# Patient Record
Sex: Male | Born: 1940 | Race: White | Hispanic: No | Marital: Married | Smoking: Never smoker
Health system: Southern US, Community
[De-identification: ages and names within clinical notes are randomized; demographics above are authoritative.]

## PROBLEM LIST (undated history)

## (undated) DIAGNOSIS — F32A Depression, unspecified: Secondary | ICD-10-CM

## (undated) DIAGNOSIS — B379 Candidiasis, unspecified: Secondary | ICD-10-CM

## (undated) DIAGNOSIS — M797 Fibromyalgia: Secondary | ICD-10-CM

## (undated) DIAGNOSIS — F329 Major depressive disorder, single episode, unspecified: Secondary | ICD-10-CM

## (undated) DIAGNOSIS — E785 Hyperlipidemia, unspecified: Secondary | ICD-10-CM

## (undated) DIAGNOSIS — N400 Enlarged prostate without lower urinary tract symptoms: Secondary | ICD-10-CM

## (undated) DIAGNOSIS — F528 Other sexual dysfunction not due to a substance or known physiological condition: Secondary | ICD-10-CM

## (undated) DIAGNOSIS — C9590 Leukemia, unspecified not having achieved remission: Secondary | ICD-10-CM

## (undated) DIAGNOSIS — K219 Gastro-esophageal reflux disease without esophagitis: Secondary | ICD-10-CM

## (undated) HISTORY — PX: CATARACT EXTRACTION, BILATERAL: SHX1313

## (undated) HISTORY — DX: Leukemia, unspecified not having achieved remission: C95.90

## (undated) HISTORY — PX: VITRECTOMY: SHX106

## (undated) HISTORY — DX: Major depressive disorder, single episode, unspecified: F32.9

## (undated) HISTORY — DX: Depression, unspecified: F32.A

## (undated) HISTORY — PX: CARPAL TUNNEL RELEASE: SHX101

## (undated) HISTORY — PX: OTHER SURGICAL HISTORY: SHX169

## (undated) HISTORY — PX: ADENOIDECTOMY: SUR15

## (undated) HISTORY — PX: VASECTOMY: SHX75

## (undated) HISTORY — DX: Hyperlipidemia, unspecified: E78.5

---

## 1996-06-30 HISTORY — PX: CERVICAL FUSION: SHX112

## 2011-06-18 ENCOUNTER — Telehealth: Payer: Self-pay | Admitting: Internal Medicine

## 2011-06-18 NOTE — Telephone Encounter (Signed)
Talked to pt, gave him appt date for 07/16/11, address, telephone

## 2011-06-20 ENCOUNTER — Telehealth: Payer: Self-pay | Admitting: Internal Medicine

## 2011-06-20 NOTE — Telephone Encounter (Signed)
Referred by Mady Gemma, PA Dx- CCL

## 2011-06-25 ENCOUNTER — Telehealth: Payer: Self-pay | Admitting: Internal Medicine

## 2011-06-25 NOTE — Telephone Encounter (Signed)
Referred by Mady Gemma, PA Dx- CLL

## 2011-07-09 DIAGNOSIS — G5600 Carpal tunnel syndrome, unspecified upper limb: Secondary | ICD-10-CM | POA: Diagnosis not present

## 2011-07-15 ENCOUNTER — Other Ambulatory Visit: Payer: Self-pay | Admitting: Internal Medicine

## 2011-07-15 DIAGNOSIS — C9110 Chronic lymphocytic leukemia of B-cell type not having achieved remission: Secondary | ICD-10-CM

## 2011-07-16 ENCOUNTER — Other Ambulatory Visit (HOSPITAL_COMMUNITY)
Admission: RE | Admit: 2011-07-16 | Discharge: 2011-07-16 | Disposition: A | Discharge status: Home / Self Care | Payer: Medicare Other | Source: Ambulatory Visit | Attending: Internal Medicine | Admitting: Internal Medicine

## 2011-07-16 ENCOUNTER — Ambulatory Visit (HOSPITAL_BASED_OUTPATIENT_CLINIC_OR_DEPARTMENT_OTHER): Payer: Medicare Other | Admitting: Internal Medicine

## 2011-07-16 ENCOUNTER — Ambulatory Visit: Payer: Medicare Other

## 2011-07-16 ENCOUNTER — Encounter: Payer: Self-pay | Admitting: Internal Medicine

## 2011-07-16 ENCOUNTER — Other Ambulatory Visit: Payer: Self-pay | Admitting: Internal Medicine

## 2011-07-16 ENCOUNTER — Other Ambulatory Visit: Payer: Medicare Other | Admitting: Lab

## 2011-07-16 VITALS — BP 136/71 | HR 95 | Temp 97.3°F | Ht 68.0 in | Wt 169.6 lb

## 2011-07-16 DIAGNOSIS — C9110 Chronic lymphocytic leukemia of B-cell type not having achieved remission: Secondary | ICD-10-CM | POA: Insufficient documentation

## 2011-07-16 LAB — COMPREHENSIVE METABOLIC PANEL
ALT: 24 U/L (ref 0–53)
BUN: 29 mg/dL — ABNORMAL HIGH (ref 6–23)
CO2: 27 mEq/L (ref 19–32)
Calcium: 9.7 mg/dL (ref 8.4–10.5)
Chloride: 102 mEq/L (ref 96–112)
Creatinine, Ser: 0.87 mg/dL (ref 0.50–1.35)
Glucose, Bld: 138 mg/dL — ABNORMAL HIGH (ref 70–99)

## 2011-07-16 LAB — CBC WITH DIFFERENTIAL/PLATELET
Basophils Absolute: 0.1 10*3/uL (ref 0.0–0.1)
Eosinophils Absolute: 0.2 10*3/uL (ref 0.0–0.5)
HCT: 43.8 % (ref 38.4–49.9)
HGB: 14.9 g/dL (ref 13.0–17.1)
LYMPH%: 63.9 % — ABNORMAL HIGH (ref 14.0–49.0)
MONO#: 0.4 10*3/uL (ref 0.1–0.9)
NEUT%: 31.4 % — ABNORMAL LOW (ref 39.0–75.0)
Platelets: 200 10*3/uL (ref 140–400)
WBC: 12.8 10*3/uL — ABNORMAL HIGH (ref 4.0–10.3)
lymph#: 8.2 10*3/uL — ABNORMAL HIGH (ref 0.9–3.3)

## 2011-07-16 LAB — FLOW CYTOMETRY

## 2011-07-16 LAB — LACTATE DEHYDROGENASE: LDH: 178 U/L (ref 94–250)

## 2011-07-16 NOTE — Progress Notes (Signed)
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 He was under the care of Dr. Margarita Rana with [facility] cancer specialist in [place]. The patient has been followed by observation. He was recently moved to [facility] to be close to his son. The patient came today to establish care with a hematologist. He denied having any complaints except for occasional insomnia as well as mild weight loss. He feels good most of the time. He denied having any night sweats, no palpable lymphadenopathy, no bleeding issues. Has no chest pain or shortness of breath, no cough or hemoptysis. He has no evidence of recurrent infections. He was referred to me today for evaluation and recommendation regarding his condition.  @[email or web address] @  Past Medical History  Diagnosis Date  . Diabetes mellitus   . Leukemia chronic lymphocytic  . Depression     Past Surgical History  Procedure Date  . Carpal tunnel release   . Vitrectomy   . Vasectomy   . Tonsil   . Cervical fusion   . Retinal micro aneurysms   . Cataract extraction, bilateral   . Adenoidectomy     Family History  Problem Relation Age of Onset  . Diabetes Father   . Diabetes Sister   . Diabetes Brother     Social History History  Substance Use Topics  . Smoking status: Never Smoker   . Smokeless tobacco: Not on file  . Alcohol Use: Yes    Allergies  Allergen Reactions  . Bee Venom   . Shellfish Allergy Swelling    Current Outpatient Prescriptions  Medication Sig Dispense Refill  . Ascorbic Acid (VITAMIN C) 100 MG tablet Take 100 mg by mouth daily.      Marland Kitchen aspirin 81 MG tablet Take 81 mg by mouth daily.      Marland Kitchen buPROPion (WELLBUTRIN) 100 MG tablet Take 100 mg by mouth 3 (three)  times daily.      . calcium citrate-vitamin D (CITRACAL+D) 315-200 MG-UNIT per tablet Take 1 tablet by mouth daily.      . cetirizine (ZYRTEC) 10 MG tablet Take 10 mg by mouth daily.      . fish oil-omega-3 fatty acids 1000 MG capsule Take 2 g by mouth daily.      . insulin lispro (HUMALOG) 100 UNIT/ML injection Inject 100 Units into the skin daily as needed.      . insulin lispro protamine-insulin lispro (HUMALOG 75/25) (75-25) 100 UNIT/ML SUSP Inject 100 Units into the skin 2 (two) times daily before a meal.      . Multiple Vitamin (MULTIVITAMIN) capsule Take 1 capsule by mouth daily.      . naproxen sodium (ANAPROX) 220 MG tablet Take 220 mg by mouth 2 (two) times daily with a meal.      . terazosin (HYTRIN) 5 MG capsule Take 5 mg by mouth at bedtime.      . thiamine (VITAMIN B-1) 100 MG tablet Take 100 mg by mouth daily.        Review of Systems  A comprehensive review of systems was negative except for: Constitutional: positive for fatigue Behavioral/Psych: positive for sleep disturbance  Physical Exam  ZOX:WRUEA, healthy, no distress, well nourished and well developed SKIN: skin color, texture, turgor are normal HEAD: Normocephalic EYES:  normal EARS: External ears normal OROPHARYNX:no exudate, no erythema and lips, buccal mucosa, and tongue normal  NECK: supple, no adenopathy LYMPH:  no palpable lymphadenopathy, no hepatosplenomegaly LUNGS: clear to auscultation  HEART: regular rate & rhythm, no murmurs and no gallops ABDOMEN:abdomen soft, non-tender, normal bowel sounds and no masses or organomegaly BACK: Back symmetric, no curvature. EXTREMITIES:no joint deformities, effusion, or inflammation, no edema, no skin discoloration, no clubbing, no cyanosis  NEURO: alert & oriented x 3 with fluent speech, no focal motor/sensory deficits  ASSESSMENT: This is a very pleasant 71 years old white male with stage 0 chronic lymphocytic leukemia diagnosed 4 years ago in [place]. The  patient is doing fine today with no significant complaints and no palpable lymphadenopathy. I have a lengthy discussion with the patient today about his disease stage, prognosis and treatment options. I explained to the patient that the current standard of care for his condition is observation. I would consider him for treatment only if he has a short doubling time of his total white blood count or significant evidence for disease progression with lymphadenopathy or endorgan damage.  PLAN: I ordered flow cytometry of the peripheral blood to confirm his diagnosis. I will continue the patient on observation for now with repeat CBC,  Comprehensive metabolic panel and LDH in 6 months. The patient would come back for followup visit at that time. He was advised to call me immediately if he has any concerning symptoms in the interval. The patient agreed to the current plan.  All questions were answered. The patient knows to call the clinic with any problems, questions or concerns. We can certainly see the patient much sooner if necessary.  Thank you so much for allowing me to participate in the care of Hayden [name]. I will continue to follow up the patient with you and assist in his care.  I spent 25 minutes counseling the patient face to face. The total time spent in the appointment was 50 minutes.   Sarika Baldini K. 07/16/2011, 5:47 PM

## 2011-09-04 ENCOUNTER — Emergency Department (HOSPITAL_COMMUNITY)
Admission: EM | Admit: 2011-09-04 | Discharge: 2011-09-04 | Disposition: A | Discharge status: Home / Self Care | Payer: Medicare Other | Attending: Emergency Medicine | Admitting: Emergency Medicine

## 2011-09-04 DIAGNOSIS — F329 Major depressive disorder, single episode, unspecified: Secondary | ICD-10-CM | POA: Diagnosis not present

## 2011-09-04 DIAGNOSIS — K5289 Other specified noninfective gastroenteritis and colitis: Secondary | ICD-10-CM | POA: Diagnosis not present

## 2011-09-04 DIAGNOSIS — Z7982 Long term (current) use of aspirin: Secondary | ICD-10-CM | POA: Diagnosis not present

## 2011-09-04 DIAGNOSIS — R197 Diarrhea, unspecified: Secondary | ICD-10-CM | POA: Diagnosis not present

## 2011-09-04 DIAGNOSIS — R112 Nausea with vomiting, unspecified: Secondary | ICD-10-CM | POA: Diagnosis not present

## 2011-09-04 DIAGNOSIS — Z79899 Other long term (current) drug therapy: Secondary | ICD-10-CM | POA: Insufficient documentation

## 2011-09-04 DIAGNOSIS — R1915 Other abnormal bowel sounds: Secondary | ICD-10-CM | POA: Insufficient documentation

## 2011-09-04 DIAGNOSIS — Z856 Personal history of leukemia: Secondary | ICD-10-CM | POA: Diagnosis not present

## 2011-09-04 DIAGNOSIS — F3289 Other specified depressive episodes: Secondary | ICD-10-CM | POA: Insufficient documentation

## 2011-09-04 DIAGNOSIS — R61 Generalized hyperhidrosis: Secondary | ICD-10-CM | POA: Diagnosis not present

## 2011-09-04 DIAGNOSIS — E119 Type 2 diabetes mellitus without complications: Secondary | ICD-10-CM | POA: Insufficient documentation

## 2011-09-04 DIAGNOSIS — K529 Noninfective gastroenteritis and colitis, unspecified: Secondary | ICD-10-CM

## 2011-09-04 DIAGNOSIS — Z794 Long term (current) use of insulin: Secondary | ICD-10-CM | POA: Diagnosis not present

## 2011-09-04 LAB — COMPREHENSIVE METABOLIC PANEL
ALT: 28 U/L (ref 0–53)
AST: 22 U/L (ref 0–37)
CO2: 27 mEq/L (ref 19–32)
Calcium: 8.6 mg/dL (ref 8.4–10.5)
Sodium: 141 mEq/L (ref 135–145)
Total Protein: 6.2 g/dL (ref 6.0–8.3)

## 2011-09-04 LAB — CBC
MCV: 92.8 fL (ref 78.0–100.0)
Platelets: 191 10*3/uL (ref 150–400)
RDW: 13.2 % (ref 11.5–15.5)
WBC: 11.7 10*3/uL — ABNORMAL HIGH (ref 4.0–10.5)

## 2011-09-04 LAB — DIFFERENTIAL
Basophils Absolute: 0 10*3/uL (ref 0.0–0.1)
Eosinophils Absolute: 0.1 10*3/uL (ref 0.0–0.7)
Eosinophils Relative: 1 % (ref 0–5)
Lymphocytes Relative: 45 % (ref 12–46)

## 2011-09-04 MED ORDER — FAMOTIDINE 20 MG PO TABS: 20.0000 mg | ORAL_TABLET | Freq: Two times a day (BID) | ORAL | Status: DC | PRN

## 2011-09-04 MED ORDER — ONDANSETRON HCL 8 MG PO TABS: 8.0000 mg | ORAL_TABLET | Freq: Three times a day (TID) | ORAL | Status: AC | PRN

## 2011-09-04 MED ORDER — FAMOTIDINE IN NACL 20-0.9 MG/50ML-% IV SOLN
20.0000 mg | Freq: Once | INTRAVENOUS | Status: AC
  Administered 2011-09-04: 20 mg via INTRAVENOUS
  Filled 2011-09-04: qty 50

## 2011-09-04 MED ORDER — SODIUM CHLORIDE 0.9 % IV BOLUS (SEPSIS): 1000.0000 mL | Freq: Once | INTRAVENOUS | Status: DC

## 2011-09-04 MED ORDER — SODIUM CHLORIDE 0.9 % IV SOLN
INTRAVENOUS | Status: DC
  Administered 2011-09-04: 16:00:00 via INTRAVENOUS

## 2011-09-04 MED ORDER — ONDANSETRON HCL 4 MG/2ML IJ SOLN
INTRAMUSCULAR | Status: AC
  Filled 2011-09-04: qty 2

## 2011-09-04 NOTE — Discharge Instructions (Signed)
Clear Liquid Diet The clear liquid dietconsists of foods that are liquid or will become liquid at room temperature.You should be able to see through the liquid and beverages. Examples of foods allowed on a clear liquid diet include fruit juice, broth or bouillon, gelatin, or frozen ice pops. The purpose of this diet is to provide necessary fluid, electrolytes such as sodium and potassium, and energy to keep the body functioning during times when you are not able to consume a regular diet.A clear liquid diet should not be continued for long periods of time as it is not nutritionally adequate.  REASONS FOR USING A CLEAR LIQUID DIET  In sudden onset (acute) conditions for a patient before or after surgery.   As the first step in oral feeding.   For fluid and electrolyte replacement in diarrheal diseases.   As a diet before certain medical tests are performed.  ADEQUACY The clear liquid diet is adequate only in ascorbic acid, according to the Recommended Dietary Allowances of the National Research Council. CHOOSING FOODS Breads and Starches  Allowed:  None are allowed.   Avoid: All are avoided.  Vegetables  Allowed:  Strained tomato or vegetable juice.   Avoid: Any others.  Fruit  Allowed:  Strained fruit juices and fruit drinks. Include 1 serving of citrus or vitamin C-enriched fruit juice daily.   Avoid: Any others.  Meat and Meat Substitutes  Allowed:  None are allowed.   Avoid: All are avoided.  Milk  Allowed:  None are allowed.   Avoid: All are avoided.  Soups and Combination Foods  Allowed:  Clear bouillon, broth, or strained broth-based soups.   Avoid: Any others.  Desserts and Sweets  Allowed:  Sugar, honey. High protein gelatin. Flavored gelatin, ices, or frozen ice pops that do not contain milk.   Avoid: Any others.  Fats and Oils  Allowed:  None are allowed.   Avoid: All are avoided.  Beverages  Allowed: Cereal beverages, coffee (regular or  decaffeinated), tea, or soda at the discretion of your caregiver.   Avoid: Any others.  Condiments  Allowed:  Iodized salt.   Avoid: Any others, including pepper.  Supplements  Allowed:  Liquid nutrition beverages.   Avoid: Any others that contain lactose or fiber.  SAMPLE MEAL PLAN Breakfast  4 oz (120 mL) strained orange juice.    to 1 cup (125 to 250 mL) gelatin (plain or fortified).   1 cup (250 mL) beverage (coffee or tea).   Sugar, if desired.  Midmorning Snack   cup (125 mL) gelatin (plain or fortified).  Lunch  1 cup (250 mL) broth or consomm.   4 oz (120 mL) strained grapefruit juice.    cup (125 mL) gelatin (plain or fortified).   1 cup (250 mL) beverage (coffee or tea).   Sugar, if desired.  Midafternoon Snack   cup (125 mL) fruit ice.    cup (125 mL) strained fruit juice.  Dinner  1 cup (250 mL) broth or consomm.    cup (125 mL) cranberry juice.    cup (125 mL) flavored gelatin (plain or fortified).   1 cup (250 mL) beverage (coffee or tea).   Sugar, if desired.  Evening Snack  4 oz (120 mL) strained apple juice (vitamin C-fortified).    cup (125 mL) flavored gelatin (plain or fortified).  Document Released: 06/16/2005 Document Revised: 06/05/2011 Document Reviewed: 09/13/2010 ExitCare Patient Information 2012 [employer].Viral Gastroenteritis Viral gastroenteritis is also known as stomach flu. This condition   affects the stomach and intestinal tract. It can cause sudden diarrhea and vomiting. The illness typically lasts 3 to 8 days. Most people develop an immune response that eventually gets rid of the virus. While this natural response develops, the virus can make you quite ill. CAUSES  Many different viruses can cause gastroenteritis, such as rotavirus or noroviruses. You can catch one of these viruses by consuming contaminated food or water. You may also catch a virus by sharing utensils or other personal items with an  infected person or by touching a contaminated surface. SYMPTOMS  The most common symptoms are diarrhea and vomiting. These problems can cause a severe loss of body fluids (dehydration) and a body salt (electrolyte) imbalance. Other symptoms may include:  Fever.   Headache.   Fatigue.   Abdominal pain.  DIAGNOSIS  Your caregiver can usually diagnose viral gastroenteritis based on your symptoms and a physical exam. A stool sample may also be taken to test for the presence of viruses or other infections. TREATMENT  This illness typically goes away on its own. Treatments are aimed at rehydration. The most serious cases of viral gastroenteritis involve vomiting so severely that you are not able to keep fluids down. In these cases, fluids must be given through an intravenous line (IV). HOME CARE INSTRUCTIONS   Drink enough fluids to keep your urine clear or pale yellow. Drink small amounts of fluids frequently and increase the amounts as tolerated.   Ask your caregiver for specific rehydration instructions.   Avoid:   Foods high in sugar.   Alcohol.   Carbonated drinks.   Tobacco.   Juice.   Caffeine drinks.   Extremely hot or cold fluids.   Fatty, greasy foods.   Too much intake of anything at one time.   Dairy products until 24 to 48 hours after diarrhea stops.   You may consume probiotics. Probiotics are active cultures of beneficial bacteria. They may lessen the amount and number of diarrheal stools in adults. Probiotics can be found in yogurt with active cultures and in supplements.   Wash your hands well to avoid spreading the virus.   Only take over-the-counter or prescription medicines for pain, discomfort, or fever as directed by your caregiver. Do not give aspirin to children. Antidiarrheal medicines are not recommended.   Ask your caregiver if you should continue to take your regular prescribed and over-the-counter medicines.   Keep all follow-up appointments  as directed by your caregiver.  SEEK IMMEDIATE MEDICAL CARE IF:   You are unable to keep fluids down.   You do not urinate at least once every 6 to 8 hours.   You develop shortness of breath.   You notice blood in your stool or vomit. This may look like coffee grounds.   You have abdominal pain that increases or is concentrated in one small area (localized).   You have persistent vomiting or diarrhea.   You have a fever.   The patient is a child younger than 3 months, and he or she has a fever.   The patient is a child older than 3 months, and he or she has a fever and persistent symptoms.   The patient is a child older than 3 months, and he or she has a fever and symptoms suddenly get worse.   The patient is a baby, and he or she has no tears when crying.  MAKE SURE YOU:   Understand these instructions.   Will watch your condition.     Will get help right away if you are not doing well or get worse.  Document Released: 06/16/2005 Document Revised: 06/05/2011 Document Reviewed: 04/02/2011 ExitCare Patient Information 2012 [employer]. RESOURCE GUIDE  Dental Problems  Patients with Medicaid: [facility]                      Dental [address]                                           [address] Phone:  [phone]                                                  Phone:  [phone]  If unable to pay or uninsured, contact:  [facility] or [facility] to become qualified for the adult dental clinic.  Chronic Pain Problems Contact [facility]  [phone] Patients need to be referred by their primary care doctor.  Insufficient Money for Medicine Contact [employer]:  call "211" or [facility] 271-5999.  No Primary Care Doctor Call [facility]  832-8000 Other agencies that provide inexpensive medical care    [facility]  [phone]    [facility]   [phone]    [facility]  [phone]    Women's Clinic  [phone]    [facility]  [phone]    [facility]  [phone]  Psychological Services  Health  [phone] [facility]  [phone] [facility]   800 853-5163 (emergency services 641-4993)  Substance Abuse Resources [facility]  336-882-2125 [facility] 336-784-9470 [facility] 336-285-9073 [facility] 336-845-3988 Residential & Outpatient Substance Abuse Program  800-659-3381  Abuse/Neglect [place] Child Abuse Hotline (336) 641-3795 [place] Child Abuse Hotline 800-378-5315 (After Hours)  Emergency Shelter [facility] (336) 271-5985  [facility] (336) 275-9566 [facility] (704) 372-4663  MRSA Hotline #:   832-7006    [place] [facility]  [facility] [place]     [employer]                          [facility] [address] [place]                       [address]      [address]  [place]                                                [place]                            [place] Phone:  [phone]                                     Phone:  [phone]                 Phone:  [phone]  [facility] Phone:  [phone]  [place] Child Abuse Hotline (336) 342-1394 (336) 342-3537 (After Hours)   

## 2011-09-04 NOTE — ED Notes (Signed)
Here with c/o nausea/vomiting/diarrhea since yesterday. Sent here per pts PA Arlyce Dice.pt has history of cancer,DM. Given NS enroute and Zofran 4 mg. States he feels better already. Denies abd pain.

## 2011-09-04 NOTE — ED Provider Notes (Addendum)
History     CSN: [id number]  Arrival date & time 09/04/11  1430   First MD Initiated Contact with Patient 09/04/11 1431      Chief Complaint  Patient presents with  . Emesis    diarrhea,nausea    (Consider location/radiation/quality/duration/timing/severity/associated sxs/prior treatment) HPI Comments: The patient is a 71 year old male with a past medical history significant for diabetes and chronic lymphocytic leukemia who presents to the emergency department for evaluation and treatment of nausea, vomiting, and diarrhea. The patient reports that nausea began last night after dinner and the patient went to bed, awakening around midnight to have an episode of watery diarrhea followed by vomiting. He had 3 more such episodes throughout the night of vomiting and diarrhea and called EMS to bring him to the hospital. The EMS team gave him a dose of Zofran IV and the patient says that his symptoms have completely resolved at this time with no abdominal pain, no further nausea, no further vomiting. He denies any blood in vomit or stool. He denies any other complaints.  Patient is a 71 y.o. male presenting with vomiting. The history is provided by the patient, the spouse and the EMS personnel.  Emesis  This is a new problem. The current episode started 12 to 24 hours ago. The problem occurs 5 to 10 times per day. The problem has been resolved. The emesis has an appearance of stomach contents. There has been no fever. Associated symptoms include diarrhea and sweats. Pertinent negatives include no abdominal pain, no arthralgias, no chills, no cough, no fever, no headaches, no myalgias and no URI.    Past Medical History  Diagnosis Date  . Diabetes mellitus   . Leukemia chronic lymphocytic  . Depression     Past Surgical History  Procedure Date  . Carpal tunnel release   . Vitrectomy   . Vasectomy   . Tonsil   . Cervical fusion   . Retinal micro aneurysms   . Cataract extraction, bilateral    . Adenoidectomy     Family History  Problem Relation Age of Onset  . Diabetes Father   . Diabetes Sister   . Diabetes Brother     History  Substance Use Topics  . Smoking status: Never Smoker   . Smokeless tobacco: Not on file  . Alcohol Use: Yes      Review of Systems  Unable to perform ROS Constitutional: Negative for fever, chills, diaphoresis, activity change, appetite change, fatigue and unexpected weight change.  HENT: Negative for ear pain, congestion, sore throat, rhinorrhea, mouth sores, trouble swallowing, neck pain, neck stiffness and postnasal drip.   Eyes: Negative.   Respiratory: Negative for cough, chest tightness, shortness of breath and wheezing.   Cardiovascular: Negative for chest pain, palpitations and leg swelling.  Gastrointestinal: Positive for nausea, vomiting and diarrhea. Negative for abdominal pain, constipation, blood in stool, abdominal distention, anal bleeding and rectal pain.  Genitourinary: Negative for dysuria, urgency, frequency, hematuria and flank pain.  Musculoskeletal: Negative for myalgias, back pain and arthralgias.  Skin: Negative for color change, pallor, rash and wound.  Neurological: Negative for dizziness, syncope, weakness, light-headedness and headaches.  Hematological: Negative for adenopathy.  Psychiatric/Behavioral: Negative.     Allergies  Bee venom and Shellfish allergy  Home Medications   Current Outpatient Rx  Name Route Sig Dispense Refill  . ASPIRIN 81 MG PO TABS Oral Take 81 mg by mouth at bedtime.     . BUPROPION HCL 100 MG PO TABS  Oral Take 100 mg by mouth 2 (two) times daily.     Marland Kitchen FEXOFENADINE HCL 60 MG PO TABS Oral Take 60 mg by mouth 2 (two) times daily.    . OMEGA-3 FATTY ACIDS 1000 MG PO CAPS Oral Take 1 g by mouth 2 (two) times daily.     Marland Kitchen NAPROXEN SODIUM 220 MG PO TABS Oral Take 220 mg by mouth 2 (two) times daily with a meal.    . VITAMIN C 500 MG PO TABS Oral Take 500 mg by mouth every morning.      Marland Kitchen CALCIUM CITRATE-VITAMIN D 315-200 MG-UNIT PO TABS Oral Take 1 tablet by mouth daily.    Marland Kitchen CETIRIZINE HCL 10 MG PO TABS Oral Take 10 mg by mouth daily.    . INSULIN LISPRO (HUMAN) 100 UNIT/ML [place] SOLN Subcutaneous Inject 100 Units into the skin daily as needed.    . INSULIN LISPRO PROT & LISPRO (75-25) 100 UNIT/ML [place] SUSP Subcutaneous Inject 100 Units into the skin 2 (two) times daily before a meal.    . TERAZOSIN HCL 5 MG PO CAPS Oral Take 5 mg by mouth at bedtime.      There were no vitals taken for this visit.  Physical Exam  Nursing note and vitals reviewed. Constitutional: He is oriented to person, place, and time. He appears well-developed and well-nourished. He is active.  Non-toxic appearance. He does not have a sickly appearance. He does not appear ill. No distress.  HENT:  Head: Normocephalic and atraumatic.  Right Ear: Hearing, tympanic membrane, external ear and ear canal normal.  Left Ear: Hearing, tympanic membrane, external ear and ear canal normal.  Nose: Nose normal. No mucosal edema.  Mouth/Throat: Uvula is midline and oropharynx is clear and moist. Mucous membranes are dry. No oral lesions. No uvula swelling. No oropharyngeal exudate, posterior oropharyngeal edema, posterior oropharyngeal erythema or tonsillar abscesses.  Eyes: Conjunctivae and EOM are normal. Pupils are equal, round, and reactive to light. Right eye exhibits no chemosis, no discharge and no exudate. Left eye exhibits no chemosis, no discharge and no exudate. Right conjunctiva is not injected. Left conjunctiva is not injected. No scleral icterus.  Neck: Normal range of motion, full passive range of motion without pain and phonation normal. Neck supple. No rigidity. No Brudzinski's sign noted.  Cardiovascular: Normal rate, regular rhythm, intact distal pulses and normal pulses.   No extrasystoles are present.  Pulmonary/Chest: Effort normal and breath sounds normal. No accessory muscle usage. Not tachypneic.  No respiratory distress. He has no decreased breath sounds. He has no wheezes. He has no rhonchi. He has no rales. He exhibits no tenderness, no crepitus and no retraction.  Abdominal: Soft. Normal appearance. He exhibits no shifting dullness, no distension, no pulsatile liver, no fluid wave, no abdominal bruit, no ascites, no pulsatile midline mass and no mass. Bowel sounds are increased. There is no hepatosplenomegaly. There is no tenderness. There is no rigidity, no rebound, no guarding and no CVA tenderness. No hernia.  Musculoskeletal: Normal range of motion.  Neurological: He is alert and oriented to person, place, and time. He has normal strength and normal reflexes. He is not disoriented. No cranial nerve deficit. Coordination normal. GCS eye subscore is 4. GCS verbal subscore is 5. GCS motor subscore is 6.  Skin: Skin is warm, dry and intact. No bruising, no ecchymosis, no lesion and no rash noted. He is not diaphoretic. No erythema. No pallor.  Psychiatric: He has a normal mood and  affect. His speech is normal and behavior is normal. Judgment and thought content normal. Cognition and memory are normal.    ED Course  Procedures (including critical care time)  Labs Reviewed - No data to display No results found.   No diagnosis found.    MDM  The patient appears most likely to have a viral gastroenteritis, uncomplicated. His abdominal exam is benign with no pain or tenderness or other abnormalities. At this time he is having no further nausea or vomiting after a dose of Zofran. We will get him rehydrated with IV fluids, I will treat him with antacids to settle his stomach, and I will check his electrolytes to assure there is no hyperglycemia or electrolyte abnormality subsequent to the vomiting and diarrhea. I expect that his white blood cell count will be elevated, as the patient tells me that it has been trending upwards over the last 4 years due to chronic lymphocytic leukemia. Based on  his physical examination and his history, I do not suspect an acute intra-abdominal process other than viral gastroenteritis, and I do not perceive him to need a CT scan. He has had no chest pain or shortness of breath palpitations to suggest a cardiac process masquerading as a gastroenteric problem.        Felisa Bonier, MD 09/04/11 1454  4:14 PM The patient's symptoms remain controlled, he has had no further vomiting, and is tolerating oral fluid intake.  Felisa Bonier, MD 09/04/11 [phone]

## 2011-11-05 DIAGNOSIS — M25559 Pain in unspecified hip: Secondary | ICD-10-CM | POA: Diagnosis not present

## 2011-11-05 DIAGNOSIS — M1990 Unspecified osteoarthritis, unspecified site: Secondary | ICD-10-CM | POA: Diagnosis not present

## 2011-11-10 DIAGNOSIS — R262 Difficulty in walking, not elsewhere classified: Secondary | ICD-10-CM | POA: Diagnosis not present

## 2011-11-10 DIAGNOSIS — M25559 Pain in unspecified hip: Secondary | ICD-10-CM | POA: Diagnosis not present

## 2011-11-12 DIAGNOSIS — R262 Difficulty in walking, not elsewhere classified: Secondary | ICD-10-CM | POA: Diagnosis not present

## 2011-11-12 DIAGNOSIS — M25559 Pain in unspecified hip: Secondary | ICD-10-CM | POA: Diagnosis not present

## 2011-11-17 DIAGNOSIS — R262 Difficulty in walking, not elsewhere classified: Secondary | ICD-10-CM | POA: Diagnosis not present

## 2011-11-17 DIAGNOSIS — M25559 Pain in unspecified hip: Secondary | ICD-10-CM | POA: Diagnosis not present

## 2011-11-19 DIAGNOSIS — R262 Difficulty in walking, not elsewhere classified: Secondary | ICD-10-CM | POA: Diagnosis not present

## 2011-11-19 DIAGNOSIS — M25559 Pain in unspecified hip: Secondary | ICD-10-CM | POA: Diagnosis not present

## 2011-11-26 DIAGNOSIS — M25559 Pain in unspecified hip: Secondary | ICD-10-CM | POA: Diagnosis not present

## 2011-11-26 DIAGNOSIS — Z794 Long term (current) use of insulin: Secondary | ICD-10-CM | POA: Diagnosis not present

## 2011-11-26 DIAGNOSIS — E119 Type 2 diabetes mellitus without complications: Secondary | ICD-10-CM | POA: Diagnosis not present

## 2011-11-26 DIAGNOSIS — E785 Hyperlipidemia, unspecified: Secondary | ICD-10-CM | POA: Diagnosis not present

## 2011-11-28 DIAGNOSIS — M25559 Pain in unspecified hip: Secondary | ICD-10-CM | POA: Diagnosis not present

## 2011-11-28 DIAGNOSIS — R262 Difficulty in walking, not elsewhere classified: Secondary | ICD-10-CM | POA: Diagnosis not present

## 2011-12-01 DIAGNOSIS — M25559 Pain in unspecified hip: Secondary | ICD-10-CM | POA: Diagnosis not present

## 2011-12-01 DIAGNOSIS — R262 Difficulty in walking, not elsewhere classified: Secondary | ICD-10-CM | POA: Diagnosis not present

## 2011-12-03 DIAGNOSIS — R262 Difficulty in walking, not elsewhere classified: Secondary | ICD-10-CM | POA: Diagnosis not present

## 2011-12-03 DIAGNOSIS — M25559 Pain in unspecified hip: Secondary | ICD-10-CM | POA: Diagnosis not present

## 2011-12-15 DIAGNOSIS — R262 Difficulty in walking, not elsewhere classified: Secondary | ICD-10-CM | POA: Diagnosis not present

## 2011-12-15 DIAGNOSIS — M25559 Pain in unspecified hip: Secondary | ICD-10-CM | POA: Diagnosis not present

## 2011-12-24 DIAGNOSIS — E119 Type 2 diabetes mellitus without complications: Secondary | ICD-10-CM | POA: Diagnosis not present

## 2011-12-24 DIAGNOSIS — E785 Hyperlipidemia, unspecified: Secondary | ICD-10-CM | POA: Diagnosis not present

## 2011-12-25 DIAGNOSIS — E119 Type 2 diabetes mellitus without complications: Secondary | ICD-10-CM | POA: Diagnosis not present

## 2011-12-26 DIAGNOSIS — B86 Scabies: Secondary | ICD-10-CM | POA: Diagnosis not present

## 2012-01-13 ENCOUNTER — Ambulatory Visit (HOSPITAL_BASED_OUTPATIENT_CLINIC_OR_DEPARTMENT_OTHER): Payer: Medicare Other | Admitting: Internal Medicine

## 2012-01-13 ENCOUNTER — Telehealth: Payer: Self-pay | Admitting: Internal Medicine

## 2012-01-13 ENCOUNTER — Other Ambulatory Visit (HOSPITAL_BASED_OUTPATIENT_CLINIC_OR_DEPARTMENT_OTHER): Payer: Medicare Other | Admitting: Lab

## 2012-01-13 VITALS — BP 107/57 | HR 86 | Temp 97.1°F | Ht 68.0 in | Wt 164.8 lb

## 2012-01-13 DIAGNOSIS — C9110 Chronic lymphocytic leukemia of B-cell type not having achieved remission: Secondary | ICD-10-CM | POA: Diagnosis not present

## 2012-01-13 LAB — CBC WITH DIFFERENTIAL/PLATELET
Basophils Absolute: 0 10*3/uL (ref 0.0–0.1)
EOS%: 2.6 % (ref 0.0–7.0)
HCT: 39.9 % (ref 38.4–49.9)
HGB: 13.5 g/dL (ref 13.0–17.1)
LYMPH%: 61.5 % — ABNORMAL HIGH (ref 14.0–49.0)
MCH: 31.8 pg (ref 27.2–33.4)
MCHC: 33.8 g/dL (ref 32.0–36.0)
MCV: 93.9 fL (ref 79.3–98.0)
NEUT%: 31.6 % — ABNORMAL LOW (ref 39.0–75.0)
Platelets: 184 10*3/uL (ref 140–400)
lymph#: 6.6 10*3/uL — ABNORMAL HIGH (ref 0.9–3.3)

## 2012-01-13 LAB — COMPREHENSIVE METABOLIC PANEL
ALT: 19 U/L (ref 0–53)
AST: 24 U/L (ref 0–37)
Alkaline Phosphatase: 54 U/L (ref 39–117)
CO2: 29 mEq/L (ref 19–32)
Chloride: 103 mEq/L (ref 96–112)
Creatinine, Ser: 0.91 mg/dL (ref 0.50–1.35)
Glucose, Bld: 204 mg/dL — ABNORMAL HIGH (ref 70–99)
Potassium: 4.8 mEq/L (ref 3.5–5.3)
Total Bilirubin: 0.9 mg/dL (ref 0.3–1.2)
Total Protein: 5.8 g/dL — ABNORMAL LOW (ref 6.0–8.3)

## 2012-01-13 LAB — LACTATE DEHYDROGENASE: LDH: 166 U/L (ref 94–250)

## 2012-01-13 NOTE — Telephone Encounter (Signed)
Gave pt appt for January 2014 lab and MD °

## 2012-01-13 NOTE — Progress Notes (Signed)
[facility] [phone]   [phone]  OFFICE PROGRESS NOTE  ARMANIE, MARTINE, PA [address] [place] [address]  DIAGNOSIS: Stage 0 chronic lymphocytic leukemia diagnosed 4 years ago in [place].  PRIOR THERAPY: None  CURRENT THERAPY: Observation.  INTERVAL HISTORY: Hayden [name] 71 y.o. male returns to the clinic today for routine six-month followup visit. The patient is doing fine today with no specific complaints. He was recently diagnosed with scabies and treated by his primary care physician 3 times. He still have some sores and lesions on the hands and arms. He denied having any significant weight loss or night sweats. No palpable lymphadenopathy. He has no chest pain or shortness of breath.   MEDICAL HISTORY: Past Medical History  Diagnosis Date  . Diabetes mellitus   . Leukemia chronic lymphocytic  . Depression     ALLERGIES:  is allergic to bee venom and shellfish allergy.  MEDICATIONS:  Current Outpatient Prescriptions  Medication Sig Dispense Refill  . aspirin 81 MG tablet Take 81 mg by mouth at bedtime.       . B Complex-C (B-COMPLEX WITH VITAMIN C) tablet Take 1 tablet by mouth every morning.      Marland Kitchen buPROPion (WELLBUTRIN) 100 MG tablet Take 100 mg by mouth 2 (two) times daily.       . Cholecalciferol (VITAMIN D3) 1000 UNITS CAPS Take 1 capsule by mouth at bedtime.      . DiphenhydrAMINE HCl (BENADRYL PO) Take 1 tablet by mouth at bedtime as needed. To help sleep.      . fexofenadine (ALLEGRA) 60 MG tablet Take 60 mg by mouth 2 (two) times daily.      . fish oil-omega-3 fatty acids 1000 MG capsule Take 1 g by mouth 2 (two) times daily.       . insulin lispro protamine-insulin lispro (HUMALOG 75/25) (75-25) 100 UNIT/ML SUSP Inject 8 Units into the skin 2 (two) times daily with a meal. Before breakfast, before supper, and before bedtime. Per sliding scale.      . lovastatin (MEVACOR) 20 MG tablet Take 20 mg by mouth at  bedtime.      . Melatonin 5 MG TABS Take 1 tablet by mouth at bedtime.      . Multiple Vitamin (MULITIVITAMIN WITH MINERALS) TABS Take 1 tablet by mouth at bedtime.      . naproxen sodium (ANAPROX) 220 MG tablet Take 220 mg by mouth 2 (two) times daily with a meal.      . Tamsulosin HCl (FLOMAX) 0.4 MG CAPS Take 0.4 mg by mouth at bedtime.      . vitamin C (ASCORBIC ACID) 500 MG tablet Take 500 mg by mouth every morning.      Marland Kitchen DISCONTD: calcium citrate-vitamin D (CITRACAL+D) 315-200 MG-UNIT per tablet Take 1 tablet by mouth daily.      Marland Kitchen DISCONTD: cetirizine (ZYRTEC) 10 MG tablet Take 10 mg by mouth daily.      Marland Kitchen DISCONTD: insulin lispro (HUMALOG) 100 UNIT/ML injection Inject 100 Units into the skin daily as needed.      Marland Kitchen DISCONTD: terazosin (HYTRIN) 5 MG capsule Take 5 mg by mouth at bedtime.        SURGICAL HISTORY:  Past Surgical History  Procedure Date  . Carpal tunnel release   . Vitrectomy   . Vasectomy   . Tonsil   . Cervical fusion   . Retinal micro aneurysms   . Cataract extraction, bilateral   .  Adenoidectomy     REVIEW OF SYSTEMS:  A comprehensive review of systems was negative.   PHYSICAL EXAMINATION: General appearance: alert, cooperative and no distress Neck: no adenopathy Lymph nodes: Cervical, supraclavicular, and axillary nodes normal. Resp: clear to auscultation bilaterally Cardio: regular rate and rhythm, S1, S2 normal, no murmur, click, rub or gallop GI: soft, non-tender; bowel sounds normal; no masses,  no organomegaly Extremities: extremities normal, atraumatic, no cyanosis or edema  ECOG PERFORMANCE STATUS: 1 - Symptomatic but completely ambulatory  Blood pressure 107/57, pulse 86, temperature 97.1 F (36.2 C), temperature source Oral, height 5\' 8"  (1.727 m), weight 164 lb 12.8 oz (74.753 kg).  LABORATORY DATA: Lab Results  Component Value Date   WBC 10.7* 01/13/2012   HGB 13.5 01/13/2012   HCT 39.9 01/13/2012   MCV 93.9 01/13/2012   PLT 184  01/13/2012      Chemistry      Component Value Date/Time   NA 141 09/04/2011 1507   K 3.9 09/04/2011 1507   CL 105 09/04/2011 1507   CO2 27 09/04/2011 1507   BUN 27* 09/04/2011 1507   CREATININE 0.85 09/04/2011 1507      Component Value Date/Time   CALCIUM 8.6 09/04/2011 1507   ALKPHOS 56 09/04/2011 1507   AST 22 09/04/2011 1507   ALT 28 09/04/2011 1507   BILITOT 1.0 09/04/2011 1507       RADIOGRAPHIC STUDIES: No results found.  ASSESSMENT: This is a very pleasant 71 years old white male with stage 0 chronic lymphocytic leukemia. He is currently on observation with no evidence for disease progression on his recent bloodwork.  PLAN: I discussed the lab result with the patient and recommended for him continuous observation for now with repeat CBC, comprehensive metabolic panel and LDH in 6 months. He would come back for followup visit at that time.  All questions were answered. The patient knows to call the clinic with any problems, questions or concerns. We can certainly see the patient much sooner if necessary.

## 2012-01-14 DIAGNOSIS — B86 Scabies: Secondary | ICD-10-CM | POA: Diagnosis not present

## 2012-03-30 DIAGNOSIS — I10 Essential (primary) hypertension: Secondary | ICD-10-CM | POA: Diagnosis not present

## 2012-03-30 DIAGNOSIS — Z23 Encounter for immunization: Secondary | ICD-10-CM | POA: Diagnosis not present

## 2012-03-30 DIAGNOSIS — G4700 Insomnia, unspecified: Secondary | ICD-10-CM | POA: Diagnosis not present

## 2012-03-30 DIAGNOSIS — R351 Nocturia: Secondary | ICD-10-CM | POA: Diagnosis not present

## 2012-03-30 DIAGNOSIS — E119 Type 2 diabetes mellitus without complications: Secondary | ICD-10-CM | POA: Diagnosis not present

## 2012-03-30 DIAGNOSIS — F329 Major depressive disorder, single episode, unspecified: Secondary | ICD-10-CM | POA: Diagnosis not present

## 2012-05-05 DIAGNOSIS — R351 Nocturia: Secondary | ICD-10-CM | POA: Diagnosis not present

## 2012-05-05 DIAGNOSIS — N401 Enlarged prostate with lower urinary tract symptoms: Secondary | ICD-10-CM | POA: Diagnosis not present

## 2012-05-07 ENCOUNTER — Telehealth: Payer: Self-pay | Admitting: *Deleted

## 2012-05-07 NOTE — Telephone Encounter (Signed)
Office note from [facility] by Dr Patsi Sears given to Dr Donnald Garre to review.  SLJ

## 2012-05-11 ENCOUNTER — Other Ambulatory Visit: Payer: Self-pay | Admitting: Urology

## 2012-06-14 DIAGNOSIS — Z23 Encounter for immunization: Secondary | ICD-10-CM | POA: Diagnosis not present

## 2012-07-02 DIAGNOSIS — F411 Generalized anxiety disorder: Secondary | ICD-10-CM | POA: Diagnosis not present

## 2012-07-02 DIAGNOSIS — G4700 Insomnia, unspecified: Secondary | ICD-10-CM | POA: Diagnosis not present

## 2012-07-07 DIAGNOSIS — R351 Nocturia: Secondary | ICD-10-CM | POA: Diagnosis not present

## 2012-07-07 DIAGNOSIS — R3915 Urgency of urination: Secondary | ICD-10-CM | POA: Diagnosis not present

## 2012-07-13 ENCOUNTER — Other Ambulatory Visit: Payer: Self-pay | Admitting: *Deleted

## 2012-07-13 ENCOUNTER — Ambulatory Visit: Payer: PRIVATE HEALTH INSURANCE | Admitting: Internal Medicine

## 2012-07-13 ENCOUNTER — Other Ambulatory Visit: Payer: PRIVATE HEALTH INSURANCE

## 2012-07-15 ENCOUNTER — Telehealth: Payer: Self-pay | Admitting: Internal Medicine

## 2012-07-15 NOTE — Telephone Encounter (Signed)
s/w pt and he is aware of his r/s appt    Hayden [name]

## 2012-07-19 DIAGNOSIS — R351 Nocturia: Secondary | ICD-10-CM | POA: Diagnosis not present

## 2012-07-19 DIAGNOSIS — N401 Enlarged prostate with lower urinary tract symptoms: Secondary | ICD-10-CM | POA: Diagnosis not present

## 2012-07-20 ENCOUNTER — Encounter (HOSPITAL_BASED_OUTPATIENT_CLINIC_OR_DEPARTMENT_OTHER): Payer: Self-pay | Admitting: *Deleted

## 2012-07-20 ENCOUNTER — Telehealth: Payer: Self-pay | Admitting: *Deleted

## 2012-07-20 NOTE — Progress Notes (Signed)
To [facility] AT 0730-CBC,CBG,Ekg on arrival-Npo after Mn-will take wellbutrin with small amt water that am.

## 2012-07-20 NOTE — Telephone Encounter (Signed)
[facility] progress note given to Dr Donnald Garre to review.  SLJ

## 2012-07-21 DIAGNOSIS — G479 Sleep disorder, unspecified: Secondary | ICD-10-CM | POA: Diagnosis not present

## 2012-07-21 DIAGNOSIS — E119 Type 2 diabetes mellitus without complications: Secondary | ICD-10-CM | POA: Diagnosis not present

## 2012-07-21 DIAGNOSIS — M2550 Pain in unspecified joint: Secondary | ICD-10-CM | POA: Diagnosis not present

## 2012-07-21 DIAGNOSIS — F329 Major depressive disorder, single episode, unspecified: Secondary | ICD-10-CM | POA: Diagnosis not present

## 2012-07-22 ENCOUNTER — Ambulatory Visit: Payer: Medicare Other

## 2012-07-22 DIAGNOSIS — M25519 Pain in unspecified shoulder: Secondary | ICD-10-CM | POA: Insufficient documentation

## 2012-07-22 DIAGNOSIS — M25659 Stiffness of unspecified hip, not elsewhere classified: Secondary | ICD-10-CM | POA: Insufficient documentation

## 2012-07-22 DIAGNOSIS — IMO0001 Reserved for inherently not codable concepts without codable children: Secondary | ICD-10-CM | POA: Insufficient documentation

## 2012-07-22 DIAGNOSIS — R5381 Other malaise: Secondary | ICD-10-CM | POA: Insufficient documentation

## 2012-07-22 DIAGNOSIS — M25559 Pain in unspecified hip: Secondary | ICD-10-CM | POA: Insufficient documentation

## 2012-07-26 ENCOUNTER — Ambulatory Visit (HOSPITAL_BASED_OUTPATIENT_CLINIC_OR_DEPARTMENT_OTHER): Payer: Medicare Other | Admitting: Anesthesiology

## 2012-07-26 ENCOUNTER — Encounter (HOSPITAL_BASED_OUTPATIENT_CLINIC_OR_DEPARTMENT_OTHER): Payer: Self-pay | Admitting: Anesthesiology

## 2012-07-26 ENCOUNTER — Other Ambulatory Visit: Payer: Self-pay | Admitting: Medical Oncology

## 2012-07-26 ENCOUNTER — Encounter (HOSPITAL_BASED_OUTPATIENT_CLINIC_OR_DEPARTMENT_OTHER)
Admission: RE | Disposition: A | Discharge status: Home / Self Care | Payer: Self-pay | Source: Ambulatory Visit | Attending: Urology

## 2012-07-26 ENCOUNTER — Encounter (HOSPITAL_BASED_OUTPATIENT_CLINIC_OR_DEPARTMENT_OTHER): Payer: Self-pay | Admitting: *Deleted

## 2012-07-26 ENCOUNTER — Ambulatory Visit (HOSPITAL_BASED_OUTPATIENT_CLINIC_OR_DEPARTMENT_OTHER)
Admission: RE | Admit: 2012-07-26 | Discharge: 2012-07-26 | Disposition: A | Discharge status: Home / Self Care | Payer: Medicare Other | Source: Ambulatory Visit | Attending: Urology | Admitting: Urology

## 2012-07-26 DIAGNOSIS — Z794 Long term (current) use of insulin: Secondary | ICD-10-CM | POA: Insufficient documentation

## 2012-07-26 DIAGNOSIS — Z79899 Other long term (current) drug therapy: Secondary | ICD-10-CM | POA: Insufficient documentation

## 2012-07-26 DIAGNOSIS — R3915 Urgency of urination: Secondary | ICD-10-CM | POA: Insufficient documentation

## 2012-07-26 DIAGNOSIS — N401 Enlarged prostate with lower urinary tract symptoms: Secondary | ICD-10-CM | POA: Diagnosis not present

## 2012-07-26 DIAGNOSIS — E119 Type 2 diabetes mellitus without complications: Secondary | ICD-10-CM | POA: Diagnosis not present

## 2012-07-26 DIAGNOSIS — R350 Frequency of micturition: Secondary | ICD-10-CM | POA: Diagnosis not present

## 2012-07-26 DIAGNOSIS — N400 Enlarged prostate without lower urinary tract symptoms: Secondary | ICD-10-CM

## 2012-07-26 DIAGNOSIS — R351 Nocturia: Secondary | ICD-10-CM | POA: Insufficient documentation

## 2012-07-26 DIAGNOSIS — C9110 Chronic lymphocytic leukemia of B-cell type not having achieved remission: Secondary | ICD-10-CM

## 2012-07-26 HISTORY — PX: PROSTATE BIOPSY: SHX241

## 2012-07-26 HISTORY — PX: CYSTOSCOPY: SHX5120

## 2012-07-26 HISTORY — DX: Gastro-esophageal reflux disease without esophagitis: K21.9

## 2012-07-26 HISTORY — DX: Fibromyalgia: M79.7

## 2012-07-26 LAB — CBC
Platelets: 201 10*3/uL (ref 150–400)
RBC: 4.73 MIL/uL (ref 4.22–5.81)
WBC: 12.6 10*3/uL — ABNORMAL HIGH (ref 4.0–10.5)

## 2012-07-26 LAB — GLUCOSE, CAPILLARY
Glucose-Capillary: 224 mg/dL — ABNORMAL HIGH (ref 70–99)
Glucose-Capillary: 176 mg/dL — ABNORMAL HIGH (ref 70–99)

## 2012-07-26 SURGERY — CYSTOSCOPY
Anesthesia: General | Site: Prostate | Wound class: Clean Contaminated

## 2012-07-26 MED ORDER — FENTANYL CITRATE 0.05 MG/ML IJ SOLN
25.0000 ug | INTRAMUSCULAR | Status: DC | PRN
  Filled 2012-07-26: qty 1

## 2012-07-26 MED ORDER — STERILE WATER FOR IRRIGATION IR SOLN
Status: DC | PRN
  Administered 2012-07-26: 3000 mL via INTRAVESICAL

## 2012-07-26 MED ORDER — CEFAZOLIN SODIUM-DEXTROSE 2-3 GM-% IV SOLR
2.0000 g | INTRAVENOUS | Status: AC
  Administered 2012-07-26: 2 g via INTRAVENOUS
  Filled 2012-07-26: qty 50

## 2012-07-26 MED ORDER — PROPOFOL 10 MG/ML IV BOLUS
INTRAVENOUS | Status: DC | PRN
  Administered 2012-07-26 (×2): 30 mg via INTRAVENOUS
  Administered 2012-07-26: 120 mg via INTRAVENOUS

## 2012-07-26 MED ORDER — LIDOCAINE HCL 2 % EX GEL
CUTANEOUS | Status: DC | PRN
  Administered 2012-07-26: 1

## 2012-07-26 MED ORDER — LACTATED RINGERS IV SOLN
INTRAVENOUS | Status: DC
  Administered 2012-07-26: 100 mL/h via INTRAVENOUS
  Filled 2012-07-26: qty 1000

## 2012-07-26 MED ORDER — ONDANSETRON HCL 4 MG/2ML IJ SOLN
INTRAMUSCULAR | Status: DC | PRN
  Administered 2012-07-26: 4 mg via INTRAVENOUS

## 2012-07-26 MED ORDER — CEFAZOLIN SODIUM 1-5 GM-% IV SOLN
1.0000 g | INTRAVENOUS | Status: DC
  Filled 2012-07-26: qty 50

## 2012-07-26 MED ORDER — FENTANYL CITRATE 0.05 MG/ML IJ SOLN
INTRAMUSCULAR | Status: DC | PRN
  Administered 2012-07-26: 50 ug via INTRAVENOUS

## 2012-07-26 MED ORDER — LIDOCAINE HCL (CARDIAC) 20 MG/ML IV SOLN
INTRAVENOUS | Status: DC | PRN
  Administered 2012-07-26: 50 mg via INTRAVENOUS

## 2012-07-26 MED ORDER — PROMETHAZINE HCL 25 MG/ML IJ SOLN
6.2500 mg | INTRAMUSCULAR | Status: DC | PRN
  Filled 2012-07-26: qty 1

## 2012-07-26 SURGICAL SUPPLY — 20 items
BAG DRAIN URO-CYSTO SKYTR STRL (DRAIN) IMPLANT
BOOTIES KNEE HIGH SLOAN (MISCELLANEOUS) IMPLANT
CANISTER SUCT LVC 12 LTR MEDI- (MISCELLANEOUS) IMPLANT
CLOTH BEACON ORANGE TIMEOUT ST (SAFETY) ×3 IMPLANT
DRAPE CAMERA CLOSED 9X96 (DRAPES) ×3 IMPLANT
DRAPE PED LAPAROTOMY (DRAPES) ×3 IMPLANT
ELECT REM PT RETURN 9FT ADLT (ELECTROSURGICAL) ×3
ELECTRODE REM PT RTRN 9FT ADLT (ELECTROSURGICAL) ×2 IMPLANT
GLOVE BIO SURGEON STRL SZ7.5 (GLOVE) ×3 IMPLANT
GOWN PREVENTION PLUS LG XLONG (DISPOSABLE) ×3 IMPLANT
GOWN STRL REIN XL XLG (GOWN DISPOSABLE) ×3 IMPLANT
NDL SAFETY ECLIPSE 18X1.5 (NEEDLE) IMPLANT
NEEDLE HYPO 18GX1.5 SHARP (NEEDLE)
NEEDLE HYPO 22GX1.5 SAFETY (NEEDLE) IMPLANT
NEEDLE SPNL 22GX7 QUINCKE BK (NEEDLE) IMPLANT
NS IRRIG 500ML POUR BTL (IV SOLUTION) IMPLANT
PACK CYSTOSCOPY (CUSTOM PROCEDURE TRAY) IMPLANT
SYR 20CC LL (SYRINGE) IMPLANT
TRAY DSU PREP LF (CUSTOM PROCEDURE TRAY) ×3 IMPLANT
WATER STERILE IRR 3000ML UROMA (IV SOLUTION) ×3 IMPLANT

## 2012-07-26 NOTE — Anesthesia Postprocedure Evaluation (Signed)
  Anesthesia Post-op Note  Patient: Hayden [name]  Procedure(s) Performed: Procedure(s) (LRB): CYSTOSCOPY (N/A) PROSTATE BIOPSY (N/A)  Patient Location: PACU  Anesthesia Type: General  Level of Consciousness: awake and alert   Airway and Oxygen Therapy: Patient Spontanous Breathing  Post-op Pain: mild  Post-op Assessment: Post-op Vital signs reviewed, Patient's Cardiovascular Status Stable, Respiratory Function Stable, Patent Airway and No signs of Nausea or vomiting  Last Vitals:  Filed Vitals:   07/26/12 0955  BP: 115/59  Pulse: 82  Temp:   Resp: 16    Post-op Vital Signs: stable   Complications: No apparent anesthesia complications

## 2012-07-26 NOTE — Interval H&P Note (Signed)
History and Physical Interval Note:  07/26/2012 8:46 AM  Hayden [name]  has presented today for surgery, with the diagnosis of Benign Prostatic Hyperplasia  The various methods of treatment have been discussed with the patient and family. After consideration of risks, benefits and other options for treatment, the patient has consented to  Procedure(s) (LRB) with comments: CYSTOSCOPY (N/A) - DIAGNOSTIC CYSTO PROSTATE ULTRASOUND     PROSTATE BIOPSY (N/A) as a surgical intervention .  The patient's history has been reviewed, patient examined, no change in status, stable for surgery.  I have reviewed the patient's chart and labs.  Questions were answered to the patient's satisfaction.     Jethro Bolus I

## 2012-07-26 NOTE — Op Note (Signed)
Pre-operative diagnosis :  BPH  Postoperative diagnosis: Same  Operation: Cystoscopy and PUS for size  Surgeon:  S. Patsi Sears, MD  First assistant: None  Anesthesia:     general  Preparation:  After appropriate pre-anesthesia, the patient  Was brought into the OR and placed upon the OR table in the dorsal supine position, where he  Underwent general LMA anesthesia. The armband was double checked. He was placed in the L lateral decubitus position, and the prostate was prepped and draped in the usual fashion.   Review history:  Hayden [name] is a 72 year old male, insulin-dependent diabetic for 30 years, returns today to review urodynamic results. He is schedule for an outpatient cystoscopy & prostate ultrasound on 07/26/12.  Hx of nocturia. 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 He has an international prostate symptom score of 23, despite terazosin, and tremulous in. He has recently retired to [place], to take care of his wife who has dementia. The patient has nocturia x4, and is unable to sleep at night because of this. His quality of life equals 6 over 6. He is anxious, and irritable. He is currently on trial of Rapaflo, which worked better than combination of tamulosin and terazosin- and complains of frequency, urgency, nocturia, intermittency, weak stream, and incomplete emptying, difficulty initiating a stream.  Video urodynamics is accomplished on 07/07/12, in the sitting position. First sensation is at 31 cc, and normal desire isn't 226 cc. Strong desire was at 466 cc. The first contraction occurs at 437 cc with a maximum contraction pressure of 12 cm water. No urinary leakage occurs at this time, and the patient is able to inhibit this unstable contraction.      Statement of  Likelihood of Success: Excellent. TIME-OUT observed.:  Procedure:  PUS showed a prostate size 25.94ccc, and no median lobe was seen. Cystoscopy showed normal urethra throughout,  and trabecullation, but no cellules or tumors or tics, or stones. Clear efflux was seen from both ureteral orifices, located on a normal trigone. The bladder was drained of fluid and the patient was awakened and taken to the PAR in good condition.

## 2012-07-26 NOTE — Anesthesia Preprocedure Evaluation (Addendum)
Anesthesia Evaluation  Patient identified by MRN, date of birth, ID band Patient awake    Reviewed: Allergy & Precautions, H&P , NPO status , Patient's Chart, lab work & pertinent test results, reviewed documented beta blocker date and time   Airway Mallampati: II TM Distance: >3 FB Neck ROM: full    Dental No notable dental hx.    Pulmonary neg pulmonary ROS,  breath sounds clear to auscultation  Pulmonary exam normal       Cardiovascular Exercise Tolerance: Good negative cardio ROS  Rhythm:regular Rate:Normal  ECG: Short PR, possible LVH   Neuro/Psych PSYCHIATRIC DISORDERS Depression  Neuromuscular disease    GI/Hepatic negative GI ROS, Neg liver ROS, GERD-  ,  Endo/Other  negative endocrine ROSdiabetes, Type 1, Insulin Dependent  Renal/GU negative Renal ROS  negative genitourinary   Musculoskeletal  (+) Fibromyalgia -  Abdominal   Peds  Hematology  (+) Blood dyscrasia, , CLL, stable.   Anesthesia Other Findings   Reproductive/Obstetrics negative OB ROS                         Anesthesia Physical Anesthesia Plan  ASA: III  Anesthesia Plan: General LMA   Post-op Pain Management:    Induction:   Airway Management Planned:   Additional Equipment:   Intra-op Plan:   Post-operative Plan:   Informed Consent: I have reviewed the patients History and Physical, chart, labs and discussed the procedure including the risks, benefits and alternatives for the proposed anesthesia with the patient or authorized representative who has indicated his/her understanding and acceptance.   Dental Advisory Given  Plan Discussed with: CRNA  Anesthesia Plan Comments:         Anesthesia Quick Evaluation

## 2012-07-26 NOTE — Transfer of Care (Signed)
Immediate Anesthesia Transfer of Care Note  Patient: Hayden [name]  Procedure(s) Performed: Procedure(s) (LRB) with comments: CYSTOSCOPY (N/A) - DIAGNOSTIC CYSTO PROSTATE ULTRASOUND     PROSTATE BIOPSY (N/A)  Patient Location: PACU  Anesthesia Type:General  Level of Consciousness: sedated and responds to stimulation  Airway & Oxygen Therapy: Patient Spontanous Breathing and Patient connected to nasal cannula oxygen  Post-op Assessment: Report given to PACU RN  Post vital signs: Reviewed and stable  Complications: No apparent anesthesia complications

## 2012-07-26 NOTE — H&P (Signed)
Active Problems Problems  1. 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 Nocturia 788.43  History of Present Illness              Mr. Stepter is a 72 year old male, insulin-dependent diabetic for 30 years, returns today to review urodynamic results.  He is schedule for an outpatient cystoscopy & prostate ultrasound on 07/26/12.    Hx of nocturia. 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 He has an international prostate symptom score of 23, despite terazosin, and tremulous in. He has recently retired to [place], to take care of his wife who has dementia. The patient has nocturia x4, and is unable to sleep at night because of this. His quality of life equals 6 over 6. He is anxious, and irritable. He is currently on trial of Rapaflo, which worked better than combination of tamulosin  and terazosin- and complains of frequency, urgency, nocturia, intermittency, weak stream, and incomplete emptying, difficulty initiating a stream.  Video urodynamics is accomplished on 07/07/12, in the sitting position. First sensation is at 31 cc, and normal desire isn't 226 cc. Strong desire was at 466 cc. The first contraction occurs at 437 cc with a maximum contraction pressure of 12 cm water. No urinary leakage occurs at this time, and the patient is able to inhibit this unstable contraction.   Past Medical History Problems  1. History of  Anxiety (Symptom) 300.00 2. History of  Arthritis V13.4 3. History of  Chronic Lymphocytic Leukemia V10.61 4. History of  Depression 311 5. History of  Diabetes Mellitus 250.00  Surgical History Problems  1. History of  Cervical Vertebral Fusion 2. History of  Eye Surgery 3. History of  Neuroplasty Median Nerve At Carpal Tunnel 4. History of  Surgery Vas Deferens Vasotomy Bilateral  Current Meds 1. Aspirin TABS; Therapy: (Recorded:06Nov2013) to 2. BuPROPion HCl ER (SR) 100 MG Oral Tablet Extended Release  12 Hour; Therapy:  (Recorded:06Nov2013) to 3. HumaLOG 100 UNIT/ML Subcutaneous Solution; Therapy: (Recorded:06Nov2013) to 4. HumaLOG Mix 75/25 SUSP; Therapy: (Recorded:06Nov2013) to 5. Multiple Vitamin TABS; Therapy: (Recorded:06Nov2013) to 6. Naproxen Sodium 220 MG Oral Tablet; Therapy: (Recorded:06Nov2013) to 7. Stool Softener CAPS; Therapy: (Recorded:06Nov2013) to 8. Tamsulosin HCl 0.4 MG Oral Capsule; Therapy: (Recorded:06Nov2013) to 9. Terazosin HCl 5 MG Oral Capsule; Therapy: (Recorded:06Nov2013) to 10. Vitamin C TABS; Therapy: (Recorded:06Nov2013) to 11. ZyrTEC Allergy 10 MG Oral Tablet; Therapy: (Recorded:06Nov2013) to  Allergies Non-Medication  1. Betadine 2. Shellfish  Family History Problems  1. Family history of  Alcoholism 2. Family history of  Diabetes Mellitus V18.0 3. Family history of  Family Health Status Number Of Children 2 daughters 4. Family history of  Father Deceased At Age 70 5. Family history of  Heart Disease V17.49 6. Family history of  Mother Deceased At Age 58  Social History Problems  1. Alcohol Use 0.02 per day 2. Caffeine Use 0.25 per day 3. Former Smoker V15.82 quit 82yrs ago 4. Marital History - Currently Married 5. Occupation: Camera operator  Review of Systems Genitourinary, constitutional, skin, eye, otolaryngeal, hematologic/lymphatic, cardiovascular, pulmonary, endocrine, musculoskeletal, gastrointestinal, neurological and psychiatric system(s) were reviewed and pertinent findings if present are noted.  Genitourinary: urinary frequency, feelings of urinary urgency, nocturia, difficulty starting the urinary stream, weak urinary stream, urinary stream starts and stops and initiating urination requires straining.    Vitals Vital Signs [Data Includes: Last 1 Day]  20Jan2014 08:28AM  Blood Pressure: 139 / 77 Temperature: 97.5 F Heart Rate: 85  Results/Data Urine [Data Includes: Last 1 Day]   20Jan2014  COLOR YELLOW     APPEARANCE CLEAR   SPECIFIC GRAVITY 1.025   pH 5.5   GLUCOSE > 1000 mg/dL  BILIRUBIN NEG   KETONE NEG mg/dL  BLOOD NEG   PROTEIN NEG mg/dL  UROBILINOGEN 0.2 mg/dL  NITRITE NEG   LEUKOCYTE ESTERASE NEG    Assessment Assessed  1. 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 Nocturia 788.43   40 mionute evaluation. BPH with severe nocturia. I think he will decrease his nocturia from 4 to 2, but do not think he will completely resolve his nocturia. He will have a sleep study also. Note he is the primary and sole caregiver for his wife with alzheimer's, and this may also affect his mood and his sleep. He has failed nocturia study in [facility]. ( drug study). ? DDAVP study candidate.    He will take Rapaflo and RTC for flow rate and pvr in 4 months. We will continue his plan for cysto under anesthesia.   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 Rapaflo 8 MG Oral Capsule; TAKE 1 CAPSULE DAILY WITH FOOD; Therapy: 20Jan2014 to  (Evaluate:19Feb2014); Last Rx:20Jan2014 Health Maintenance (V70.0)  2. UA With REFLEX  Done: 20Jan2014 08:03AM Nocturia (788.43)  3. Follow-up Month x 4 Office  Follow-up  Requested for: 20Jan2014 4. Complex Uroflowmetry  Requested for: 20Jan2014 5. PVR U/S  Requested for: 20Jan2014   1. Continue with plans for cysto under anesthesia with pictures for him.  2. Rapaflo, 8mg /day. Samples and script given.  3. RTC for flow rate and pvr. ? green light laser.   Signatures Electronically signed by : Jethro Bolus, M.D.; Jul 19 2012 10:03AM

## 2012-07-26 NOTE — Anesthesia Procedure Notes (Signed)
Procedure Name: LMA Insertion Date/Time: 07/26/2012 8:51 AM Performed by: Maris Berger T Pre-anesthesia Checklist: Patient identified, Emergency Drugs available, Suction available and Patient being monitored Patient Re-evaluated:Patient Re-evaluated prior to inductionOxygen Delivery Method: Circle System Utilized Preoxygenation: Pre-oxygenation with 100% oxygen Intubation Type: IV induction Ventilation: Mask ventilation without difficulty LMA: LMA inserted LMA Size: 4.0 Number of attempts: 1 Placement Confirmation: positive ETCO2 Tube secured with: Tape Dental Injury: Teeth and Oropharynx as per pre-operative assessment

## 2012-07-27 ENCOUNTER — Ambulatory Visit: Payer: PRIVATE HEALTH INSURANCE | Admitting: Internal Medicine

## 2012-07-27 ENCOUNTER — Ambulatory Visit: Payer: Medicare Other

## 2012-07-27 ENCOUNTER — Other Ambulatory Visit: Payer: PRIVATE HEALTH INSURANCE | Admitting: Lab

## 2012-07-27 ENCOUNTER — Encounter (HOSPITAL_BASED_OUTPATIENT_CLINIC_OR_DEPARTMENT_OTHER): Payer: Self-pay | Admitting: Urology

## 2012-07-29 ENCOUNTER — Ambulatory Visit: Payer: Medicare Other

## 2012-08-03 ENCOUNTER — Ambulatory Visit: Payer: Medicare Other

## 2012-08-03 DIAGNOSIS — IMO0001 Reserved for inherently not codable concepts without codable children: Secondary | ICD-10-CM | POA: Insufficient documentation

## 2012-08-03 DIAGNOSIS — R5381 Other malaise: Secondary | ICD-10-CM | POA: Insufficient documentation

## 2012-08-03 DIAGNOSIS — M25519 Pain in unspecified shoulder: Secondary | ICD-10-CM | POA: Insufficient documentation

## 2012-08-03 DIAGNOSIS — M25559 Pain in unspecified hip: Secondary | ICD-10-CM | POA: Diagnosis not present

## 2012-08-03 DIAGNOSIS — N401 Enlarged prostate with lower urinary tract symptoms: Secondary | ICD-10-CM | POA: Diagnosis not present

## 2012-08-03 DIAGNOSIS — R351 Nocturia: Secondary | ICD-10-CM | POA: Diagnosis not present

## 2012-08-05 ENCOUNTER — Ambulatory Visit: Payer: Medicare Other

## 2012-08-10 ENCOUNTER — Ambulatory Visit: Payer: Medicare Other

## 2012-08-13 ENCOUNTER — Encounter: Payer: PRIVATE HEALTH INSURANCE | Admitting: Physical Therapy

## 2012-08-17 ENCOUNTER — Ambulatory Visit: Payer: Medicare Other

## 2012-08-18 ENCOUNTER — Ambulatory Visit: Payer: Medicare Other | Admitting: Physical Therapy

## 2012-08-19 ENCOUNTER — Ambulatory Visit: Payer: PRIVATE HEALTH INSURANCE

## 2012-08-24 ENCOUNTER — Ambulatory Visit: Payer: Medicare Other

## 2012-08-26 ENCOUNTER — Ambulatory Visit: Payer: Medicare Other | Admitting: Physical Therapy

## 2012-08-31 ENCOUNTER — Ambulatory Visit: Payer: Medicare Other | Admitting: Physical Therapy

## 2012-09-02 ENCOUNTER — Ambulatory Visit: Payer: Medicare Other

## 2012-09-02 DIAGNOSIS — IMO0001 Reserved for inherently not codable concepts without codable children: Secondary | ICD-10-CM | POA: Diagnosis not present

## 2012-09-02 DIAGNOSIS — M25519 Pain in unspecified shoulder: Secondary | ICD-10-CM | POA: Diagnosis not present

## 2012-09-02 DIAGNOSIS — R5381 Other malaise: Secondary | ICD-10-CM | POA: Diagnosis not present

## 2012-09-02 DIAGNOSIS — M25559 Pain in unspecified hip: Secondary | ICD-10-CM | POA: Insufficient documentation

## 2012-09-07 ENCOUNTER — Ambulatory Visit: Payer: Medicare Other | Admitting: Physical Therapy

## 2012-09-07 DIAGNOSIS — IMO0001 Reserved for inherently not codable concepts without codable children: Secondary | ICD-10-CM | POA: Diagnosis not present

## 2012-09-07 DIAGNOSIS — R5381 Other malaise: Secondary | ICD-10-CM | POA: Diagnosis not present

## 2012-09-07 DIAGNOSIS — M25519 Pain in unspecified shoulder: Secondary | ICD-10-CM | POA: Diagnosis not present

## 2012-09-07 DIAGNOSIS — M25559 Pain in unspecified hip: Secondary | ICD-10-CM | POA: Diagnosis not present

## 2012-09-09 ENCOUNTER — Ambulatory Visit: Payer: Medicare Other

## 2012-09-09 DIAGNOSIS — IMO0001 Reserved for inherently not codable concepts without codable children: Secondary | ICD-10-CM | POA: Diagnosis not present

## 2012-09-09 DIAGNOSIS — R5381 Other malaise: Secondary | ICD-10-CM | POA: Diagnosis not present

## 2012-09-09 DIAGNOSIS — M25519 Pain in unspecified shoulder: Secondary | ICD-10-CM | POA: Diagnosis not present

## 2012-09-09 DIAGNOSIS — M25559 Pain in unspecified hip: Secondary | ICD-10-CM | POA: Diagnosis not present

## 2012-09-13 ENCOUNTER — Ambulatory Visit: Payer: Medicare Other

## 2012-09-13 DIAGNOSIS — M25559 Pain in unspecified hip: Secondary | ICD-10-CM | POA: Diagnosis not present

## 2012-09-13 DIAGNOSIS — M25519 Pain in unspecified shoulder: Secondary | ICD-10-CM | POA: Diagnosis not present

## 2012-09-13 DIAGNOSIS — IMO0001 Reserved for inherently not codable concepts without codable children: Secondary | ICD-10-CM | POA: Diagnosis not present

## 2012-09-13 DIAGNOSIS — R5381 Other malaise: Secondary | ICD-10-CM | POA: Diagnosis not present

## 2012-09-15 ENCOUNTER — Ambulatory Visit: Payer: Medicare Other

## 2012-09-15 DIAGNOSIS — M25519 Pain in unspecified shoulder: Secondary | ICD-10-CM | POA: Diagnosis not present

## 2012-09-15 DIAGNOSIS — IMO0001 Reserved for inherently not codable concepts without codable children: Secondary | ICD-10-CM | POA: Diagnosis not present

## 2012-09-15 DIAGNOSIS — R5381 Other malaise: Secondary | ICD-10-CM | POA: Diagnosis not present

## 2012-09-15 DIAGNOSIS — M25559 Pain in unspecified hip: Secondary | ICD-10-CM | POA: Diagnosis not present

## 2012-09-21 ENCOUNTER — Ambulatory Visit: Payer: Medicare Other | Admitting: Physical Therapy

## 2012-09-21 DIAGNOSIS — M25559 Pain in unspecified hip: Secondary | ICD-10-CM | POA: Diagnosis not present

## 2012-09-21 DIAGNOSIS — R5381 Other malaise: Secondary | ICD-10-CM | POA: Diagnosis not present

## 2012-09-21 DIAGNOSIS — IMO0001 Reserved for inherently not codable concepts without codable children: Secondary | ICD-10-CM | POA: Diagnosis not present

## 2012-09-21 DIAGNOSIS — M25519 Pain in unspecified shoulder: Secondary | ICD-10-CM | POA: Diagnosis not present

## 2012-09-23 ENCOUNTER — Ambulatory Visit: Payer: Medicare Other

## 2012-10-20 DIAGNOSIS — G479 Sleep disorder, unspecified: Secondary | ICD-10-CM | POA: Diagnosis not present

## 2012-10-20 DIAGNOSIS — F329 Major depressive disorder, single episode, unspecified: Secondary | ICD-10-CM | POA: Diagnosis not present

## 2012-10-20 DIAGNOSIS — E119 Type 2 diabetes mellitus without complications: Secondary | ICD-10-CM | POA: Diagnosis not present

## 2012-11-30 DIAGNOSIS — E1149 Type 2 diabetes mellitus with other diabetic neurological complication: Secondary | ICD-10-CM | POA: Diagnosis not present

## 2012-11-30 DIAGNOSIS — Z5181 Encounter for therapeutic drug level monitoring: Secondary | ICD-10-CM | POA: Diagnosis not present

## 2012-11-30 DIAGNOSIS — E1142 Type 2 diabetes mellitus with diabetic polyneuropathy: Secondary | ICD-10-CM | POA: Diagnosis not present

## 2012-12-03 DIAGNOSIS — F411 Generalized anxiety disorder: Secondary | ICD-10-CM | POA: Diagnosis not present

## 2012-12-22 DIAGNOSIS — F411 Generalized anxiety disorder: Secondary | ICD-10-CM | POA: Diagnosis not present

## 2012-12-28 DIAGNOSIS — F411 Generalized anxiety disorder: Secondary | ICD-10-CM | POA: Diagnosis not present

## 2013-01-05 DIAGNOSIS — F411 Generalized anxiety disorder: Secondary | ICD-10-CM | POA: Diagnosis not present

## 2013-01-19 DIAGNOSIS — E7800 Pure hypercholesterolemia, unspecified: Secondary | ICD-10-CM | POA: Diagnosis not present

## 2013-01-19 DIAGNOSIS — G4700 Insomnia, unspecified: Secondary | ICD-10-CM | POA: Diagnosis not present

## 2013-01-19 DIAGNOSIS — F341 Dysthymic disorder: Secondary | ICD-10-CM | POA: Diagnosis not present

## 2013-02-05 DIAGNOSIS — F411 Generalized anxiety disorder: Secondary | ICD-10-CM | POA: Diagnosis not present

## 2013-02-08 DIAGNOSIS — R351 Nocturia: Secondary | ICD-10-CM | POA: Diagnosis not present

## 2013-02-17 DIAGNOSIS — F411 Generalized anxiety disorder: Secondary | ICD-10-CM | POA: Diagnosis not present

## 2013-03-08 DIAGNOSIS — F411 Generalized anxiety disorder: Secondary | ICD-10-CM | POA: Diagnosis not present

## 2013-03-09 DIAGNOSIS — Z23 Encounter for immunization: Secondary | ICD-10-CM | POA: Diagnosis not present

## 2013-03-16 DIAGNOSIS — F411 Generalized anxiety disorder: Secondary | ICD-10-CM | POA: Diagnosis not present

## 2013-03-18 ENCOUNTER — Ambulatory Visit: Payer: PRIVATE HEALTH INSURANCE | Admitting: *Deleted

## 2013-03-22 ENCOUNTER — Encounter: Payer: Self-pay | Admitting: *Deleted

## 2013-03-22 ENCOUNTER — Encounter: Payer: Medicare Other | Attending: Internal Medicine | Admitting: *Deleted

## 2013-03-22 VITALS — Ht 68.0 in | Wt 166.3 lb

## 2013-03-22 DIAGNOSIS — Z713 Dietary counseling and surveillance: Secondary | ICD-10-CM | POA: Diagnosis not present

## 2013-03-22 DIAGNOSIS — IMO0001 Reserved for inherently not codable concepts without codable children: Secondary | ICD-10-CM

## 2013-03-22 DIAGNOSIS — E119 Type 2 diabetes mellitus without complications: Secondary | ICD-10-CM | POA: Insufficient documentation

## 2013-03-22 DIAGNOSIS — F430 Acute stress reaction: Secondary | ICD-10-CM

## 2013-03-22 NOTE — Progress Notes (Signed)
.Appt start time: 1100 end time:  1300.   Assessment:  Patient was seen on  03/22/13 for individual diabetes education. Hayden [name] comes for dietary refresher. 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 He has a significant family history of T2DM. 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 He is depressed and stressed over the DX of his wife's Hayden [name]) dementia and decline "I mourn her" he is tearful. "I'm drowning"  She is a well educated individual. Worked as a Engineer, civil (consulting). He has difficulty sleeping. He has support from his son Hayden [name]) locally, is going to attend care giver support group, sees a therapist. As I try to focus on his diet and behaviors he goes back to discussion involving his wife and his own psychological challenges.  Fears going to the Dentist, dealing with gingivitis. Went one period for 7 years without dental care. Is working closely with dentist at this time. Saw eye doctor 3 years ago. Has history of retinal hemorrhage. Planning to schedule with Dr. Luciana Axe for eye exam. If wife is not feeling well, she will take a few bites of food and give the remaining to Hayden [name], which he eats. This causes over eating. Tests glucose TID, before all three meals. FBS average is 95-180.  Before bed >180-300. He is adamant about reducing his A1c to 6-6.5% Presently testing before breakfast, dinner and bed. His depression and stress is overriding his ability to manage his diabetes.  Current HbA1c: 7.2%  MEDICATIONS: See list: Humalog Mix (flexpen) 75-25% 8u  With breakfast and bedtime. Uses sliding scale of 2u for every 50mg /dl (he is very vague about how he manages his glucose) He seems to do his own thing   DIETARY INTAKE:  Usual eating pattern includes 3 meals and 2 snacks per day.  Everyday foods include balanced variety of foods.  Limited  foods include red meat.  (Crazy about chocolate, ice cream, cookies)  24-hr recall:  B ( AM): 2 scrambled eggs, slice of ham, slice cheddar cheese, 2 slices  of toast, juice (rare) Snk ( AM): None (occassional will have fruit) L ( PM): left overs from [employer] 1/2 panini (eggplant Parmesan), vegetables mixed Snk ( PM): ice cream bar (occasional ice cream sandwich) D ( PM): chicken, vegetables Snk ( PM): none  Beverages: water, diet pepsi, coffee 3-5 cups a week  Usual physical activity: none, ADLs, mow the lawn   Nutritional Diagnosis:  NB-1.1 Food and nutrition-related knowledge deficit As related to elevated glucose.  As evidenced by A1c 7.2%.    Intervention:  Nutrition counseling provided.  Discussed diabetes disease process and treatment options.  Discussed physiology of diabetes and role of obesity on insulin resistance.  Encouraged moderate weight reduction to improve glucose levels.  Discussed role of medications and diet in glucose control  Provided education on macronutrients on glucose levels.  Provided education on carb counting, importance of regularly scheduled meals/snacks, and meal planning  Discussed effects of physical activity on glucose levels and long-term glucose control.  Recommended 150 minutes of physical activity/week.  Reviewed patient medications.  Discussed role of medication on blood glucose and possible side effects  Discussed blood glucose monitoring and interpretation.  Discussed recommended target ranges and individual ranges.    Described short-term complications: hyper- and hypo-glycemia.  Discussed causes,symptoms, and treatment options.  Discussed prevention, detection, and treatment of long-term complications.  Discussed the role of prolonged elevated glucose levels on body systems.  Discussed role of stress on blood glucose  levels and discussed strategies to manage psychosocial issues.  Discussed recommendations for long-term diabetes self-care.  Established checklist for medical, dental, and emotional self-care.  Handouts given during visit include:  Living Well with Diabetes  Carb  Counting and Food Label handouts  Meal Plan Card  Snack sheet  Plan:  Aim for 3 Carb Choices per meal (45 grams) +/- 1 either way  Aim for 0-1 Carbs per snack if hungry  Consider reading food labels for Total Carbohydrate Grams of foods Consider  increasing your activity level by walking for 30-60  Minutes 3-5 times per week as tolerated (Ideally take Hayden [name] with you) Consider checking BG at alternate times per day to include fasting (before breakfast) and 2 hours after first bite of each meal in addition to pre meal. Log all glucose readings (consider food log) Take medication  as directed by MD Cut the grass with push mower around the immediate house (continue to hire someone to cut the other acreage) Have a glass of water near you at all times. Aim for 8 glasses of water per day Decrease caffeine (caffeine free coffee & caffeine free diet pepsi) intake as it is causing difficulty with sleep and anxiety  Barriers to learning/adherance to lifestyle change: own psychological wellbeing, and wife's health  Diabetes self-care support plan:   [name] support group  Family  CDE  Endocrinologist  Monitoring/Evaluation:  Dietary intake, exercise, glucose monitor, and body weight return in 4 week(s).

## 2013-03-23 ENCOUNTER — Encounter: Payer: Self-pay | Admitting: *Deleted

## 2013-04-12 DIAGNOSIS — F411 Generalized anxiety disorder: Secondary | ICD-10-CM | POA: Diagnosis not present

## 2013-04-22 ENCOUNTER — Ambulatory Visit: Payer: PRIVATE HEALTH INSURANCE | Admitting: *Deleted

## 2013-04-26 DIAGNOSIS — F411 Generalized anxiety disorder: Secondary | ICD-10-CM | POA: Diagnosis not present

## 2013-05-03 ENCOUNTER — Encounter: Payer: Self-pay | Admitting: *Deleted

## 2013-05-03 ENCOUNTER — Encounter: Payer: Medicare Other | Attending: Internal Medicine | Admitting: *Deleted

## 2013-05-03 VITALS — Ht 68.0 in | Wt 158.7 lb

## 2013-05-03 DIAGNOSIS — E119 Type 2 diabetes mellitus without complications: Secondary | ICD-10-CM

## 2013-05-03 DIAGNOSIS — Z713 Dietary counseling and surveillance: Secondary | ICD-10-CM | POA: Insufficient documentation

## 2013-05-03 NOTE — Progress Notes (Signed)
Mr. Trettin notes that he is intentionally losing weight. Some of his glucose readings are WNL, however many readings are grossly elevated. He can see that his eating behavior has a direct relationship on his glucose reading. He likes to eat fruit and has decreased the amount he consumes because it causes GI distress. He continues to indulge in cookies and ice cream. He continues to have a great deal of stress with his wife's dementia. Likes meditation but has not gotten back into it yet. Sleep pattern is poor 3-5 hours per night. He has an appointment with his psychologist soon and will discuss it with him. He is a self proclaimed "hoarder". He agrees that he has "Dietary indiscretions" . I find that most of his issues are psychosocial in nature. I have provided him with a new yellow dietary card at his request. He is fully knowledgeable about how to make appropriate food choices, yet he makes voluntary poor choices. I have encouraged him to work on his food choices. Follow up with Dr. Sharl Ma at the 3 month follow up time at which time modification in medication management may be warranted. Follow up with me PRN.

## 2013-05-07 DIAGNOSIS — F411 Generalized anxiety disorder: Secondary | ICD-10-CM | POA: Diagnosis not present

## 2013-06-15 DIAGNOSIS — G4700 Insomnia, unspecified: Secondary | ICD-10-CM | POA: Diagnosis not present

## 2013-06-15 DIAGNOSIS — R159 Full incontinence of feces: Secondary | ICD-10-CM | POA: Diagnosis not present

## 2013-07-05 DIAGNOSIS — Z86010 Personal history of colonic polyps: Secondary | ICD-10-CM | POA: Diagnosis not present

## 2013-07-05 DIAGNOSIS — R198 Other specified symptoms and signs involving the digestive system and abdomen: Secondary | ICD-10-CM | POA: Diagnosis not present

## 2013-07-21 DIAGNOSIS — H9190 Unspecified hearing loss, unspecified ear: Secondary | ICD-10-CM | POA: Diagnosis not present

## 2013-07-21 DIAGNOSIS — H6120 Impacted cerumen, unspecified ear: Secondary | ICD-10-CM | POA: Diagnosis not present

## 2013-07-21 DIAGNOSIS — E119 Type 2 diabetes mellitus without complications: Secondary | ICD-10-CM | POA: Diagnosis not present

## 2013-07-21 DIAGNOSIS — E7800 Pure hypercholesterolemia, unspecified: Secondary | ICD-10-CM | POA: Diagnosis not present

## 2013-07-21 DIAGNOSIS — G4700 Insomnia, unspecified: Secondary | ICD-10-CM | POA: Diagnosis not present

## 2013-07-21 DIAGNOSIS — I10 Essential (primary) hypertension: Secondary | ICD-10-CM | POA: Diagnosis not present

## 2013-08-01 ENCOUNTER — Telehealth: Payer: Self-pay | Admitting: Internal Medicine

## 2013-08-01 NOTE — Telephone Encounter (Signed)
returned pt call and lvm for appt...mailed pt appt sched, avs and letter

## 2013-08-16 ENCOUNTER — Other Ambulatory Visit: Payer: Self-pay | Admitting: *Deleted

## 2013-08-16 DIAGNOSIS — C9110 Chronic lymphocytic leukemia of B-cell type not having achieved remission: Secondary | ICD-10-CM

## 2013-08-17 ENCOUNTER — Other Ambulatory Visit: Payer: PRIVATE HEALTH INSURANCE

## 2013-08-17 ENCOUNTER — Ambulatory Visit: Payer: PRIVATE HEALTH INSURANCE | Admitting: Physician Assistant

## 2013-08-23 ENCOUNTER — Telehealth: Payer: Self-pay | Admitting: Internal Medicine

## 2013-08-23 NOTE — Telephone Encounter (Signed)
pt called to r/s missed appt...done....pt aware of new d.t °

## 2013-08-29 DIAGNOSIS — G5600 Carpal tunnel syndrome, unspecified upper limb: Secondary | ICD-10-CM | POA: Diagnosis not present

## 2013-09-01 ENCOUNTER — Other Ambulatory Visit (HOSPITAL_BASED_OUTPATIENT_CLINIC_OR_DEPARTMENT_OTHER): Payer: Medicare Other

## 2013-09-01 ENCOUNTER — Ambulatory Visit (HOSPITAL_BASED_OUTPATIENT_CLINIC_OR_DEPARTMENT_OTHER): Payer: Medicare Other | Admitting: Physician Assistant

## 2013-09-01 ENCOUNTER — Telehealth: Payer: Self-pay | Admitting: Internal Medicine

## 2013-09-01 ENCOUNTER — Encounter: Payer: Self-pay | Admitting: Physician Assistant

## 2013-09-01 VITALS — BP 132/58 | HR 87 | Temp 98.8°F | Resp 19 | Ht 68.0 in | Wt 155.1 lb

## 2013-09-01 DIAGNOSIS — C9110 Chronic lymphocytic leukemia of B-cell type not having achieved remission: Secondary | ICD-10-CM | POA: Diagnosis not present

## 2013-09-01 LAB — CBC WITH DIFFERENTIAL/PLATELET
BASO%: 0.4 % (ref 0.0–2.0)
Basophils Absolute: 0 10*3/uL (ref 0.0–0.1)
EOS%: 0.9 % (ref 0.0–7.0)
Eosinophils Absolute: 0.1 10*3/uL (ref 0.0–0.5)
HCT: 37.5 % — ABNORMAL LOW (ref 38.4–49.9)
HGB: 12.7 g/dL — ABNORMAL LOW (ref 13.0–17.1)
LYMPH%: 57.8 % — AB (ref 14.0–49.0)
MCH: 31.6 pg (ref 27.2–33.4)
MCHC: 33.8 g/dL (ref 32.0–36.0)
MCV: 93.6 fL (ref 79.3–98.0)
MONO#: 0.4 10*3/uL (ref 0.1–0.9)
MONO%: 3 % (ref 0.0–14.0)
NEUT#: 5 10*3/uL (ref 1.5–6.5)
NEUT%: 37.9 % — AB (ref 39.0–75.0)
PLATELETS: 210 10*3/uL (ref 140–400)
RBC: 4.01 10*6/uL — AB (ref 4.20–5.82)
RDW: 12.9 % (ref 11.0–14.6)
WBC: 13.1 10*3/uL — ABNORMAL HIGH (ref 4.0–10.3)
lymph#: 7.6 10*3/uL — ABNORMAL HIGH (ref 0.9–3.3)

## 2013-09-01 LAB — COMPREHENSIVE METABOLIC PANEL (CC13)
ALK PHOS: 55 U/L (ref 40–150)
ALT: 15 U/L (ref 0–55)
AST: 17 U/L (ref 5–34)
Albumin: 4.2 g/dL (ref 3.5–5.0)
Anion Gap: 11 mEq/L (ref 3–11)
BILIRUBIN TOTAL: 0.92 mg/dL (ref 0.20–1.20)
BUN: 25.2 mg/dL (ref 7.0–26.0)
CO2: 26 mEq/L (ref 22–29)
CREATININE: 1 mg/dL (ref 0.7–1.3)
Calcium: 9.9 mg/dL (ref 8.4–10.4)
Chloride: 104 mEq/L (ref 98–109)
GLUCOSE: 229 mg/dL — AB (ref 70–140)
Potassium: 3.7 mEq/L (ref 3.5–5.1)
SODIUM: 142 meq/L (ref 136–145)
TOTAL PROTEIN: 6.9 g/dL (ref 6.4–8.3)

## 2013-09-01 LAB — LACTATE DEHYDROGENASE (CC13): LDH: 168 U/L (ref 125–245)

## 2013-09-01 NOTE — Telephone Encounter (Signed)
s.w. pt wife and advised that pt has some appts...she did not want to take appts....mailed pt appt sched avs adn letter

## 2013-09-01 NOTE — Progress Notes (Signed)
[place] Telephone:([phone]   Fax:(336) [phone]  OFFICE PROGRESS NOTE  Juanell Fairly, MD [place] [address]  DIAGNOSIS: Stage 0 chronic lymphocytic leukemia diagnosed 4 years ago in [place].  PRIOR THERAPY: None  CURRENT THERAPY: Observation.  INTERVAL HISTORY: Hayden [name] 73 y.o. male returns to the clinic today after being lost to followup since his last office visit on 01/13/2012. Patient states that he is caring for his wife who has dementia and is on both Aricept and Namenda. He is her primary caregiver. His primary care physician is Dr. London Pepper at each of the breast field practice. Recently getting over an upper respiratory illness with cough, sore throat and postnasal drip. He is been using over-the-counter medications with good results. He is trying to be more responsible with his own health and he is reconnecting with his psychologist. He is scheduled to have an colonoscopy by Dr. Anson Fret on 09/07/2013. He also has reconnected with his endocrinologist regarding his diabetes management. He presents today to reconnect with our office regarding his history of stage 0 chronic lymphocytic leukemia. He denies any lymphadenopathy or fever or chills or night sweats. He denied any chest pain or shortness of breath. Other than the resolving upper respiratory illness he voiced no specific complaints today. He had a repeat laboratory studies and presents to discuss the results.   MEDICAL HISTORY: Past Medical History  Diagnosis Date  . Diabetes mellitus   . Depression   . Fibromyalgia   . GERD (gastroesophageal reflux disease)     reflux intermittent  . Leukemia chronic lymphocytic    2008 diagnosed-monitored Dr   . Hyperlipidemia     ALLERGIES:  is allergic to bee venom and shellfish allergy.  MEDICATIONS:  Current Outpatient Prescriptions  Medication Sig Dispense Refill  . aspirin 81 MG tablet Take 81 mg by  mouth at bedtime.       . B Complex-C (B-COMPLEX WITH VITAMIN C) tablet Take 1 tablet by mouth every morning.      Marland Kitchen buPROPion (WELLBUTRIN) 100 MG tablet Take 100 mg by mouth 2 (two) times daily.       . Cholecalciferol (VITAMIN D3) 1000 UNITS CAPS Take 1 capsule by mouth at bedtime.      . DiphenhydrAMINE HCl (BENADRYL PO) Take 1 tablet by mouth at bedtime as needed. To help sleep.      . fexofenadine (ALLEGRA) 60 MG tablet Take 60 mg by mouth 2 (two) times daily.      . fish oil-omega-3 fatty acids 1000 MG capsule Take 1 g by mouth 2 (two) times daily.       . insulin lispro protamine-insulin lispro (HUMALOG 75/25) (75-25) 100 UNIT/ML SUSP Inject 8 Units into the skin 2 (two) times daily with a meal. Before breakfast, before supper, and before bedtime. Per sliding scale.      . lovastatin (MEVACOR) 20 MG tablet Take 20 mg by mouth at bedtime.      . metFORMIN (GLUCOPHAGE) 500 MG tablet       . Multiple Vitamin (MULITIVITAMIN WITH MINERALS) TABS Take 1 tablet by mouth at bedtime.      . naproxen sodium (ANAPROX) 220 MG tablet Take 220 mg by mouth 2 (two) times daily with a meal.      . silodosin (RAPAFLO) 8 MG CAPS capsule Take 8 mg by mouth daily with breakfast.      . traZODone (DESYREL) 100 MG  tablet       . vitamin C (ASCORBIC ACID) 500 MG tablet Take 500 mg by mouth every morning.      . [DISCONTINUED] calcium citrate-vitamin D (CITRACAL+D) 315-200 MG-UNIT per tablet Take 1 tablet by mouth daily.      . [DISCONTINUED] cetirizine (ZYRTEC) 10 MG tablet Take 10 mg by mouth daily.      . [DISCONTINUED] insulin lispro (HUMALOG) 100 UNIT/ML injection Inject 100 Units into the skin daily as needed.      . [DISCONTINUED] terazosin (HYTRIN) 5 MG capsule Take 5 mg by mouth at bedtime.       No current facility-administered medications for this visit.    SURGICAL HISTORY:  Past Surgical History  Procedure Laterality Date  . Carpal tunnel release      rt hand  . Vitrectomy    . Vasectomy     . Tonsil    . Cervical fusion  1998  . Retinal micro aneurysms    . Cataract extraction, bilateral    . Adenoidectomy    . Cystoscopy  07/26/2012    Procedure: CYSTOSCOPY;  Surgeon: Ailene Rud, MD;  Location: [facility];  Service: Urology;  Laterality: N/A;  DIAGNOSTIC CYSTO PROSTATE ULTRASOUND      . Prostate biopsy  07/26/2012    Procedure: PROSTATE BIOPSY;  Surgeon: Ailene Rud, MD;  Location: [facility];  Service: Urology;  Laterality: N/A;    REVIEW OF SYSTEMS:  A comprehensive review of systems was negative except for: Resolving upper respiratory symptoms   PHYSICAL EXAMINATION: General appearance: alert, cooperative and no distress Head: Normocephalic, without obvious abnormality, atraumatic Neck: no adenopathy Lymph nodes: Cervical, supraclavicular, and axillary nodes normal. Resp: clear to auscultation bilaterally Cardio: regular rate and rhythm, S1, S2 normal, no murmur, click, rub or gallop GI: soft, non-tender; bowel sounds normal; no masses,  no organomegaly Extremities: extremities normal, atraumatic, no cyanosis or edema Neurologic: Alert and oriented X 3, normal strength and tone. Normal symmetric reflexes. Normal coordination and gait  ECOG PERFORMANCE STATUS: 1 - Symptomatic but completely ambulatory  Blood pressure 132/58, pulse 87, temperature 98.8 F (37.1 C), temperature source Oral, resp. rate 19, height 5\' 8"  (1.727 m), weight 155 lb 1.6 oz (70.353 kg).  LABORATORY DATA: Lab Results  Component Value Date   WBC 13.1* 09/01/2013   HGB 12.7* 09/01/2013   HCT 37.5* 09/01/2013   MCV 93.6 09/01/2013   PLT 210 09/01/2013      Chemistry      Component Value Date/Time   NA 142 09/01/2013 1353   NA 139 01/13/2012 0908   K 3.7 09/01/2013 1353   K 4.8 01/13/2012 0908   CL 103 01/13/2012 0908   CO2 26 09/01/2013 1353   CO2 29 01/13/2012 0908   BUN 25.2 09/01/2013 1353   BUN 23 01/13/2012 0908   CREATININE 1.0 09/01/2013 1353    CREATININE 0.91 01/13/2012 0908      Component Value Date/Time   CALCIUM 9.9 09/01/2013 1353   CALCIUM 8.9 01/13/2012 0908   ALKPHOS 55 09/01/2013 1353   ALKPHOS 54 01/13/2012 0908   AST 17 09/01/2013 1353   AST 24 01/13/2012 0908   ALT 15 09/01/2013 1353   ALT 19 01/13/2012 0908   BILITOT 0.92 09/01/2013 1353   BILITOT 0.9 01/13/2012 0908       RADIOGRAPHIC STUDIES: No results found.  ASSESSMENT: This is a very pleasant 73 years old white male with stage 0 chronic  lymphocytic leukemia. He is currently on observation with no evidence for disease progression on his recent bloodwork. His white total white count is slightly elevated at 13.1 however it has been a little over a year and a half since the patient was last seen in our office. His white count at that time was 10.7 and a CBC done on 07/26/2012 revealed a white count of 12.6.  PLAN: I discussed the lab result with the patient and recommended for him continuous observation for now with repeat CBC, comprehensive metabolic panel and LDH in 6 months. The patient was encouraged to followup as scheduled and to notify us if he develops any signs or symptoms of concern in the interim   All questions were answered. The patient knows to call the clinic with any problems, questions or concerns. We can certainly see the patient much sooner if necessary.  Wynetta Emery, Laylah Riga E, PA-C

## 2013-09-02 DIAGNOSIS — R197 Diarrhea, unspecified: Secondary | ICD-10-CM | POA: Diagnosis not present

## 2013-09-02 DIAGNOSIS — E1149 Type 2 diabetes mellitus with other diabetic neurological complication: Secondary | ICD-10-CM | POA: Diagnosis not present

## 2013-09-02 DIAGNOSIS — E1142 Type 2 diabetes mellitus with diabetic polyneuropathy: Secondary | ICD-10-CM | POA: Diagnosis not present

## 2013-09-05 NOTE — Patient Instructions (Signed)
Followup in 6 months with repeat labs to reevaluate her disease

## 2013-09-07 DIAGNOSIS — R198 Other specified symptoms and signs involving the digestive system and abdomen: Secondary | ICD-10-CM | POA: Diagnosis not present

## 2013-09-09 ENCOUNTER — Ambulatory Visit
Admission: RE | Admit: 2013-09-09 | Discharge: 2013-09-09 | Disposition: A | Discharge status: Home / Self Care | Payer: Medicare Other | Source: Ambulatory Visit | Attending: Family Medicine | Admitting: Family Medicine

## 2013-09-09 ENCOUNTER — Other Ambulatory Visit: Payer: Self-pay | Admitting: Family Medicine

## 2013-09-09 DIAGNOSIS — M25552 Pain in left hip: Principal | ICD-10-CM

## 2013-09-09 DIAGNOSIS — M25559 Pain in unspecified hip: Secondary | ICD-10-CM | POA: Diagnosis not present

## 2013-09-09 DIAGNOSIS — M25551 Pain in right hip: Secondary | ICD-10-CM

## 2013-09-13 DIAGNOSIS — H3560 Retinal hemorrhage, unspecified eye: Secondary | ICD-10-CM | POA: Diagnosis not present

## 2013-09-13 DIAGNOSIS — E1139 Type 2 diabetes mellitus with other diabetic ophthalmic complication: Secondary | ICD-10-CM | POA: Diagnosis not present

## 2013-09-13 DIAGNOSIS — E11339 Type 2 diabetes mellitus with moderate nonproliferative diabetic retinopathy without macular edema: Secondary | ICD-10-CM | POA: Diagnosis not present

## 2013-09-29 DIAGNOSIS — E1149 Type 2 diabetes mellitus with other diabetic neurological complication: Secondary | ICD-10-CM | POA: Diagnosis not present

## 2013-10-04 ENCOUNTER — Encounter (HOSPITAL_BASED_OUTPATIENT_CLINIC_OR_DEPARTMENT_OTHER): Admission: RE | Payer: Self-pay | Source: Ambulatory Visit

## 2013-10-04 ENCOUNTER — Ambulatory Visit (HOSPITAL_BASED_OUTPATIENT_CLINIC_OR_DEPARTMENT_OTHER): Admission: RE | Admit: 2013-10-04 | Payer: Medicare Other | Source: Ambulatory Visit | Admitting: Orthopedic Surgery

## 2013-10-04 SURGERY — CARPAL TUNNEL RELEASE
Anesthesia: Choice | Laterality: Left

## 2013-10-05 DIAGNOSIS — F411 Generalized anxiety disorder: Secondary | ICD-10-CM | POA: Diagnosis not present

## 2013-10-18 DIAGNOSIS — F3289 Other specified depressive episodes: Secondary | ICD-10-CM | POA: Diagnosis not present

## 2013-10-18 DIAGNOSIS — I10 Essential (primary) hypertension: Secondary | ICD-10-CM | POA: Diagnosis not present

## 2013-10-18 DIAGNOSIS — E7800 Pure hypercholesterolemia, unspecified: Secondary | ICD-10-CM | POA: Diagnosis not present

## 2013-10-18 DIAGNOSIS — N529 Male erectile dysfunction, unspecified: Secondary | ICD-10-CM | POA: Diagnosis not present

## 2013-10-18 DIAGNOSIS — G4700 Insomnia, unspecified: Secondary | ICD-10-CM | POA: Diagnosis not present

## 2013-10-18 DIAGNOSIS — E119 Type 2 diabetes mellitus without complications: Secondary | ICD-10-CM | POA: Diagnosis not present

## 2013-10-18 DIAGNOSIS — F329 Major depressive disorder, single episode, unspecified: Secondary | ICD-10-CM | POA: Diagnosis not present

## 2013-12-12 DIAGNOSIS — E1142 Type 2 diabetes mellitus with diabetic polyneuropathy: Secondary | ICD-10-CM | POA: Diagnosis not present

## 2013-12-12 DIAGNOSIS — E1149 Type 2 diabetes mellitus with other diabetic neurological complication: Secondary | ICD-10-CM | POA: Diagnosis not present

## 2013-12-12 DIAGNOSIS — Z23 Encounter for immunization: Secondary | ICD-10-CM | POA: Diagnosis not present

## 2014-01-10 DIAGNOSIS — E119 Type 2 diabetes mellitus without complications: Secondary | ICD-10-CM | POA: Diagnosis not present

## 2014-01-10 DIAGNOSIS — I10 Essential (primary) hypertension: Secondary | ICD-10-CM | POA: Diagnosis not present

## 2014-01-10 DIAGNOSIS — F341 Dysthymic disorder: Secondary | ICD-10-CM | POA: Diagnosis not present

## 2014-01-10 DIAGNOSIS — E7800 Pure hypercholesterolemia, unspecified: Secondary | ICD-10-CM | POA: Diagnosis not present

## 2014-01-10 DIAGNOSIS — G4700 Insomnia, unspecified: Secondary | ICD-10-CM | POA: Diagnosis not present

## 2014-03-01 ENCOUNTER — Encounter: Payer: Self-pay | Admitting: Internal Medicine

## 2014-03-01 ENCOUNTER — Other Ambulatory Visit (HOSPITAL_BASED_OUTPATIENT_CLINIC_OR_DEPARTMENT_OTHER): Payer: Medicare Other

## 2014-03-01 ENCOUNTER — Telehealth: Payer: Self-pay | Admitting: Internal Medicine

## 2014-03-01 ENCOUNTER — Ambulatory Visit (HOSPITAL_BASED_OUTPATIENT_CLINIC_OR_DEPARTMENT_OTHER): Payer: Medicare Other | Admitting: Internal Medicine

## 2014-03-01 VITALS — BP 124/68 | HR 87 | Temp 98.7°F | Resp 18 | Ht 68.0 in | Wt 155.8 lb

## 2014-03-01 DIAGNOSIS — C9110 Chronic lymphocytic leukemia of B-cell type not having achieved remission: Secondary | ICD-10-CM | POA: Diagnosis not present

## 2014-03-01 LAB — COMPREHENSIVE METABOLIC PANEL (CC13)
ALBUMIN: 4.1 g/dL (ref 3.5–5.0)
ALT: 18 U/L (ref 0–55)
ANION GAP: 11 meq/L (ref 3–11)
AST: 16 U/L (ref 5–34)
Alkaline Phosphatase: 58 U/L (ref 40–150)
BUN: 21.3 mg/dL (ref 7.0–26.0)
CALCIUM: 9 mg/dL (ref 8.4–10.4)
CHLORIDE: 107 meq/L (ref 98–109)
CO2: 22 meq/L (ref 22–29)
CREATININE: 0.9 mg/dL (ref 0.7–1.3)
GLUCOSE: 236 mg/dL — AB (ref 70–140)
POTASSIUM: 4.4 meq/L (ref 3.5–5.1)
Sodium: 139 mEq/L (ref 136–145)
Total Bilirubin: 0.84 mg/dL (ref 0.20–1.20)
Total Protein: 6.4 g/dL (ref 6.4–8.3)

## 2014-03-01 LAB — CBC WITH DIFFERENTIAL/PLATELET
BASO%: 0.2 % (ref 0.0–2.0)
BASOS ABS: 0 10*3/uL (ref 0.0–0.1)
EOS ABS: 0.2 10*3/uL (ref 0.0–0.5)
EOS%: 1.7 % (ref 0.0–7.0)
HEMATOCRIT: 40 % (ref 38.4–49.9)
HEMOGLOBIN: 13.4 g/dL (ref 13.0–17.1)
LYMPH#: 7.7 10*3/uL — AB (ref 0.9–3.3)
LYMPH%: 64.5 % — ABNORMAL HIGH (ref 14.0–49.0)
MCH: 31.3 pg (ref 27.2–33.4)
MCHC: 33.5 g/dL (ref 32.0–36.0)
MCV: 93.5 fL (ref 79.3–98.0)
MONO#: 0.4 10*3/uL (ref 0.1–0.9)
MONO%: 3.1 % (ref 0.0–14.0)
NEUT%: 30.5 % — AB (ref 39.0–75.0)
NEUTROS ABS: 3.6 10*3/uL (ref 1.5–6.5)
Platelets: 163 10*3/uL (ref 140–400)
RBC: 4.28 10*6/uL (ref 4.20–5.82)
RDW: 13.1 % (ref 11.0–14.6)
WBC: 11.9 10*3/uL — ABNORMAL HIGH (ref 4.0–10.3)

## 2014-03-01 LAB — LACTATE DEHYDROGENASE (CC13): LDH: 175 U/L (ref 125–245)

## 2014-03-01 NOTE — Telephone Encounter (Signed)
Pt confirmed labs/ov per 09/02 POF, gave pt AVS...KJ °

## 2014-03-01 NOTE — Progress Notes (Signed)
[place] Telephone:([phone]   Fax:(336) [phone]  OFFICE PROGRESS NOTE  Hayden Fairly, MD [place] [address]  DIAGNOSIS: Stage 0 chronic lymphocytic leukemia diagnosed 2009 in [place].  PRIOR THERAPY: None  CURRENT THERAPY: Observation.  INTERVAL HISTORY: Hayden [name] 73 y.o. male returns to the clinic today for routine six-month followup visit. The patient is doing fine today with no specific complaints. He denied having any significant chest pain, shortness of breath, cough or hemoptysis. He denied having any significant weight loss or night sweats. No palpable lymphadenopathy. He has no nausea or vomiting, no fever or chills. He has repeat CBC performed earlier today and he is here for evaluation and discussion of his lab results.  MEDICAL HISTORY: Past Medical History  Diagnosis Date  . Diabetes mellitus   . Depression   . Fibromyalgia   . GERD (gastroesophageal reflux disease)     reflux intermittent  . Leukemia chronic lymphocytic    2008 diagnosed-monitored Dr   . Hyperlipidemia     ALLERGIES:  is allergic to bee venom; glucophage; and shellfish allergy.  MEDICATIONS:  Current Outpatient Prescriptions  Medication Sig Dispense Refill  . aspirin 81 MG tablet Take 81 mg by mouth at bedtime.       Marland Kitchen buPROPion (WELLBUTRIN) 100 MG tablet Take 100 mg by mouth 2 (two) times daily.       . Cholecalciferol (VITAMIN D3) 1000 UNITS CAPS Take 1 capsule by mouth at bedtime.      . DiphenhydrAMINE HCl (BENADRYL PO) Take 1 tablet by mouth at bedtime as needed. To help sleep.      . fexofenadine (ALLEGRA) 60 MG tablet Take 60 mg by mouth daily.       . fish oil-omega-3 fatty acids 1000 MG capsule Take 1 g by mouth 2 (two) times daily.       . insulin lispro protamine-insulin lispro (HUMALOG 75/25) (75-25) 100 UNIT/ML SUSP Inject 8 Units into the skin 2 (two) times daily with a meal. Before breakfast, before supper, and  before bedtime. Per sliding scale.      . lovastatin (MEVACOR) 20 MG tablet Take 20 mg by mouth at bedtime.      . Multiple Vitamin (MULITIVITAMIN WITH MINERALS) TABS Take 1 tablet by mouth at bedtime.      . naproxen sodium (ANAPROX) 220 MG tablet Take 220 mg by mouth 2 (two) times daily with a meal.      . silodosin (RAPAFLO) 8 MG CAPS capsule Take 8 mg by mouth daily with breakfast.      . vitamin B-12 (CYANOCOBALAMIN) 1000 MCG tablet Take 1,000 mcg by mouth daily.      . vitamin C (ASCORBIC ACID) 500 MG tablet Take 500 mg by mouth every morning.      . [DISCONTINUED] calcium citrate-vitamin D (CITRACAL+D) 315-200 MG-UNIT per tablet Take 1 tablet by mouth daily.      . [DISCONTINUED] cetirizine (ZYRTEC) 10 MG tablet Take 10 mg by mouth daily.      . [DISCONTINUED] insulin lispro (HUMALOG) 100 UNIT/ML injection Inject 100 Units into the skin daily as needed.      . [DISCONTINUED] terazosin (HYTRIN) 5 MG capsule Take 5 mg by mouth at bedtime.       No current facility-administered medications for this visit.    SURGICAL HISTORY:  Past Surgical History  Procedure Laterality Date  . Carpal tunnel release  rt hand  . Vitrectomy    . Vasectomy    . Tonsil    . Cervical fusion  1998  . Retinal micro aneurysms    . Cataract extraction, bilateral    . Adenoidectomy    . Cystoscopy  07/26/2012    Procedure: CYSTOSCOPY;  Surgeon: Ailene Rud, MD;  Location: [facility];  Service: Urology;  Laterality: N/A;  DIAGNOSTIC CYSTO PROSTATE ULTRASOUND      . Prostate biopsy  07/26/2012    Procedure: PROSTATE BIOPSY;  Surgeon: Ailene Rud, MD;  Location: [facility];  Service: Urology;  Laterality: N/A;    REVIEW OF SYSTEMS:  A comprehensive review of systems was negative.   PHYSICAL EXAMINATION: General appearance: alert, cooperative and no distress Neck: no adenopathy Lymph nodes: Cervical, supraclavicular, and axillary nodes  normal. Resp: clear to auscultation bilaterally Cardio: regular rate and rhythm, S1, S2 normal, no murmur, click, rub or gallop GI: soft, non-tender; bowel sounds normal; no masses,  no organomegaly Extremities: extremities normal, atraumatic, no cyanosis or edema  ECOG PERFORMANCE STATUS: 1 - Symptomatic but completely ambulatory  Blood pressure 124/68, pulse 87, temperature 98.7 F (37.1 C), temperature source Oral, resp. rate 18, height 5\' 8"  (1.727 m), weight 155 lb 12.8 oz (70.67 kg), SpO2 98.00%.  LABORATORY DATA: Lab Results  Component Value Date   WBC 11.9* 03/01/2014   HGB 13.4 03/01/2014   HCT 40.0 03/01/2014   MCV 93.5 03/01/2014   PLT 163 03/01/2014      Chemistry      Component Value Date/Time   NA 142 09/01/2013 1353   NA 139 01/13/2012 0908   K 3.7 09/01/2013 1353   K 4.8 01/13/2012 0908   CL 103 01/13/2012 0908   CO2 26 09/01/2013 1353   CO2 29 01/13/2012 0908   BUN 25.2 09/01/2013 1353   BUN 23 01/13/2012 0908   CREATININE 1.0 09/01/2013 1353   CREATININE 0.91 01/13/2012 0908      Component Value Date/Time   CALCIUM 9.9 09/01/2013 1353   CALCIUM 8.9 01/13/2012 0908   ALKPHOS 55 09/01/2013 1353   ALKPHOS 54 01/13/2012 0908   AST 17 09/01/2013 1353   AST 24 01/13/2012 0908   ALT 15 09/01/2013 1353   ALT 19 01/13/2012 0908   BILITOT 0.92 09/01/2013 1353   BILITOT 0.9 01/13/2012 0908       RADIOGRAPHIC STUDIES: No results found.  ASSESSMENT AND PLAN: This is a very pleasant 73 years old white male with stage 0 chronic lymphocytic leukemia. He is currently on observation with no evidence for disease progression on his recent bloodwork. I discussed the lab result with the patient and recommended for him continuous observation for now with repeat CBC, comprehensive metabolic panel and LDH and followup visit in 6 months. All questions were answered. The patient knows to call the clinic with any problems, questions or concerns. We can certainly see the patient much sooner if  necessary.  Disclaimer: This note was dictated with voice recognition software. Similar sounding words can inadvertently be transcribed and may be missed upon review.

## 2014-03-13 DIAGNOSIS — Z23 Encounter for immunization: Secondary | ICD-10-CM | POA: Diagnosis not present

## 2014-06-13 DIAGNOSIS — E1142 Type 2 diabetes mellitus with diabetic polyneuropathy: Secondary | ICD-10-CM | POA: Diagnosis not present

## 2014-07-03 DIAGNOSIS — G5602 Carpal tunnel syndrome, left upper limb: Secondary | ICD-10-CM | POA: Diagnosis not present

## 2014-07-12 DIAGNOSIS — Z125 Encounter for screening for malignant neoplasm of prostate: Secondary | ICD-10-CM | POA: Diagnosis not present

## 2014-07-12 DIAGNOSIS — E780 Pure hypercholesterolemia: Secondary | ICD-10-CM | POA: Diagnosis not present

## 2014-07-12 DIAGNOSIS — Z136 Encounter for screening for cardiovascular disorders: Secondary | ICD-10-CM | POA: Diagnosis not present

## 2014-07-12 DIAGNOSIS — E1142 Type 2 diabetes mellitus with diabetic polyneuropathy: Secondary | ICD-10-CM | POA: Diagnosis not present

## 2014-07-12 DIAGNOSIS — N400 Enlarged prostate without lower urinary tract symptoms: Secondary | ICD-10-CM | POA: Diagnosis not present

## 2014-07-12 DIAGNOSIS — Z0000 Encounter for general adult medical examination without abnormal findings: Secondary | ICD-10-CM | POA: Diagnosis not present

## 2014-08-17 DIAGNOSIS — R351 Nocturia: Secondary | ICD-10-CM | POA: Diagnosis not present

## 2014-08-17 DIAGNOSIS — Z125 Encounter for screening for malignant neoplasm of prostate: Secondary | ICD-10-CM | POA: Diagnosis not present

## 2014-08-30 ENCOUNTER — Other Ambulatory Visit: Payer: Medicare Other

## 2014-08-30 ENCOUNTER — Ambulatory Visit: Payer: Medicare Other | Admitting: Internal Medicine

## 2014-09-12 DIAGNOSIS — E1142 Type 2 diabetes mellitus with diabetic polyneuropathy: Secondary | ICD-10-CM | POA: Diagnosis not present

## 2014-10-13 DIAGNOSIS — N400 Enlarged prostate without lower urinary tract symptoms: Secondary | ICD-10-CM | POA: Diagnosis not present

## 2014-10-13 DIAGNOSIS — E1142 Type 2 diabetes mellitus with diabetic polyneuropathy: Secondary | ICD-10-CM | POA: Diagnosis not present

## 2014-10-13 DIAGNOSIS — G4700 Insomnia, unspecified: Secondary | ICD-10-CM | POA: Diagnosis not present

## 2014-10-13 DIAGNOSIS — E780 Pure hypercholesterolemia: Secondary | ICD-10-CM | POA: Diagnosis not present

## 2015-02-19 DIAGNOSIS — E780 Pure hypercholesterolemia: Secondary | ICD-10-CM | POA: Diagnosis not present

## 2015-02-19 DIAGNOSIS — F418 Other specified anxiety disorders: Secondary | ICD-10-CM | POA: Diagnosis not present

## 2015-02-19 DIAGNOSIS — Z794 Long term (current) use of insulin: Secondary | ICD-10-CM | POA: Diagnosis not present

## 2015-02-19 DIAGNOSIS — E1142 Type 2 diabetes mellitus with diabetic polyneuropathy: Secondary | ICD-10-CM | POA: Diagnosis not present

## 2015-02-19 DIAGNOSIS — G4700 Insomnia, unspecified: Secondary | ICD-10-CM | POA: Diagnosis not present

## 2015-04-11 DIAGNOSIS — Z23 Encounter for immunization: Secondary | ICD-10-CM | POA: Diagnosis not present

## 2015-05-10 DIAGNOSIS — F411 Generalized anxiety disorder: Secondary | ICD-10-CM | POA: Diagnosis not present

## 2015-05-14 DIAGNOSIS — F411 Generalized anxiety disorder: Secondary | ICD-10-CM | POA: Diagnosis not present

## 2015-07-17 DIAGNOSIS — Z0000 Encounter for general adult medical examination without abnormal findings: Secondary | ICD-10-CM | POA: Diagnosis not present

## 2015-07-17 DIAGNOSIS — E7800 Pure hypercholesterolemia, unspecified: Secondary | ICD-10-CM | POA: Diagnosis not present

## 2015-07-17 DIAGNOSIS — G4700 Insomnia, unspecified: Secondary | ICD-10-CM | POA: Diagnosis not present

## 2015-07-17 DIAGNOSIS — E1142 Type 2 diabetes mellitus with diabetic polyneuropathy: Secondary | ICD-10-CM | POA: Diagnosis not present

## 2015-07-17 DIAGNOSIS — N400 Enlarged prostate without lower urinary tract symptoms: Secondary | ICD-10-CM | POA: Diagnosis not present

## 2015-07-17 DIAGNOSIS — Z125 Encounter for screening for malignant neoplasm of prostate: Secondary | ICD-10-CM | POA: Diagnosis not present

## 2015-07-30 DIAGNOSIS — F411 Generalized anxiety disorder: Secondary | ICD-10-CM | POA: Diagnosis not present

## 2015-08-02 ENCOUNTER — Emergency Department (HOSPITAL_COMMUNITY)
Admission: EM | Admit: 2015-08-02 | Discharge: 2015-08-02 | Disposition: A | Discharge status: Home / Self Care | Payer: Medicare Other | Attending: Emergency Medicine | Admitting: Emergency Medicine

## 2015-08-02 ENCOUNTER — Emergency Department (HOSPITAL_COMMUNITY): Payer: Medicare Other

## 2015-08-02 ENCOUNTER — Encounter (HOSPITAL_COMMUNITY): Payer: Self-pay | Admitting: Emergency Medicine

## 2015-08-02 DIAGNOSIS — R109 Unspecified abdominal pain: Secondary | ICD-10-CM

## 2015-08-02 DIAGNOSIS — E119 Type 2 diabetes mellitus without complications: Secondary | ICD-10-CM | POA: Insufficient documentation

## 2015-08-02 DIAGNOSIS — E785 Hyperlipidemia, unspecified: Secondary | ICD-10-CM | POA: Diagnosis not present

## 2015-08-02 DIAGNOSIS — F329 Major depressive disorder, single episode, unspecified: Secondary | ICD-10-CM | POA: Insufficient documentation

## 2015-08-02 DIAGNOSIS — K219 Gastro-esophageal reflux disease without esophagitis: Secondary | ICD-10-CM | POA: Insufficient documentation

## 2015-08-02 DIAGNOSIS — Z7982 Long term (current) use of aspirin: Secondary | ICD-10-CM | POA: Insufficient documentation

## 2015-08-02 DIAGNOSIS — Z79899 Other long term (current) drug therapy: Secondary | ICD-10-CM | POA: Insufficient documentation

## 2015-08-02 DIAGNOSIS — Z794 Long term (current) use of insulin: Secondary | ICD-10-CM | POA: Diagnosis not present

## 2015-08-02 DIAGNOSIS — N200 Calculus of kidney: Secondary | ICD-10-CM | POA: Diagnosis not present

## 2015-08-02 LAB — CBC WITH DIFFERENTIAL/PLATELET
BASOS ABS: 0 10*3/uL (ref 0.0–0.1)
Basophils Relative: 0 %
EOS ABS: 0.2 10*3/uL (ref 0.0–0.7)
Eosinophils Relative: 1 %
HCT: 43.7 % (ref 39.0–52.0)
Hemoglobin: 15.6 g/dL (ref 13.0–17.0)
LYMPHS ABS: 10.8 10*3/uL — AB (ref 0.7–4.0)
Lymphocytes Relative: 62 %
MCH: 32.1 pg (ref 26.0–34.0)
MCHC: 35.7 g/dL (ref 30.0–36.0)
MCV: 89.9 fL (ref 78.0–100.0)
Monocytes Absolute: 0.5 10*3/uL (ref 0.1–1.0)
Monocytes Relative: 3 %
NEUTROS ABS: 6 10*3/uL (ref 1.7–7.7)
Neutrophils Relative %: 34 %
PLATELETS: 205 10*3/uL (ref 150–400)
RBC: 4.86 MIL/uL (ref 4.22–5.81)
RDW: 12.8 % (ref 11.5–15.5)
WBC: 17.5 10*3/uL — ABNORMAL HIGH (ref 4.0–10.5)

## 2015-08-02 LAB — BASIC METABOLIC PANEL
ANION GAP: 13 (ref 5–15)
BUN: 21 mg/dL — ABNORMAL HIGH (ref 6–20)
CALCIUM: 9.7 mg/dL (ref 8.9–10.3)
CO2: 23 mmol/L (ref 22–32)
CREATININE: 0.9 mg/dL (ref 0.61–1.24)
Chloride: 104 mmol/L (ref 101–111)
GLUCOSE: 161 mg/dL — AB (ref 65–99)
Potassium: 5 mmol/L (ref 3.5–5.1)
Sodium: 140 mmol/L (ref 135–145)

## 2015-08-02 LAB — URINALYSIS, ROUTINE W REFLEX MICROSCOPIC
BILIRUBIN URINE: NEGATIVE
Hgb urine dipstick: NEGATIVE
KETONES UR: 15 mg/dL — AB
LEUKOCYTES UA: NEGATIVE
Nitrite: NEGATIVE
Protein, ur: NEGATIVE mg/dL
Specific Gravity, Urine: 1.035 — ABNORMAL HIGH (ref 1.005–1.030)
pH: 6 (ref 5.0–8.0)

## 2015-08-02 LAB — URINE MICROSCOPIC-ADD ON

## 2015-08-02 MED ORDER — MORPHINE SULFATE (PF) 4 MG/ML IV SOLN
4.0000 mg | Freq: Once | INTRAVENOUS | Status: DC
Start: 2015-08-02 — End: 2015-08-02

## 2015-08-02 MED ORDER — KETOROLAC TROMETHAMINE 30 MG/ML IJ SOLN
30.0000 mg | Freq: Once | INTRAMUSCULAR | Status: AC
  Administered 2015-08-02: 30 mg via INTRAMUSCULAR
  Filled 2015-08-02: qty 1

## 2015-08-02 MED ORDER — SODIUM CHLORIDE 0.9 % IV BOLUS (SEPSIS): 1000.0000 mL | Freq: Once | INTRAVENOUS | Status: DC

## 2015-08-02 NOTE — ED Notes (Signed)
Pt comes from home with c/o right flank pain. Pt reports severe pain last night without dysuria, hematuria, or drainage. Pt has taken some naproxen with a little relief. Pt has a dull ache now.

## 2015-08-02 NOTE — ED Notes (Signed)
Pt discharged home with spouse. Given meals. Pt refused to sign. All questions answered.

## 2015-08-02 NOTE — ED Provider Notes (Signed)
CSN: [id number]     Arrival date & time 08/02/15  0935 History   First MD Initiated Contact with Patient 08/02/15 [phone]     Chief Complaint  Patient presents with  . Flank Pain    right    HPI   Hayden [name] is a 75 y.o. male with a PMH of DM, HLD, GERD, leukemia who presents to the ED with right sided flank pain, which he states started last night and has improved since that time. He notes he feels like he was "punched." He denies exacerbating factors. He has taken naproxen with some symptom relief. He denies fever, chills, abdominal pain, N/V/D/C, dysuria, urgency, frequency, hematuria. He states he has CLL and is being monitored with yearly surveillance, however is not undergoing chemotherapy.   Past Medical History  Diagnosis Date  . Diabetes mellitus   . Depression   . Fibromyalgia   . GERD (gastroesophageal reflux disease)     reflux intermittent  . Leukemia ([place]) chronic lymphocytic    2008 diagnosed-monitored Dr   . Hyperlipidemia    Past Surgical History  Procedure Laterality Date  . Carpal tunnel release      rt hand  . Vitrectomy    . Vasectomy    . Tonsil    . Cervical fusion  1998  . Retinal micro aneurysms    . Cataract extraction, bilateral    . Adenoidectomy    . Cystoscopy  07/26/2012    Procedure: CYSTOSCOPY;  Surgeon: Ailene Rud, MD;  Location: [facility];  Service: Urology;  Laterality: N/A;  DIAGNOSTIC CYSTO PROSTATE ULTRASOUND      . Prostate biopsy  07/26/2012    Procedure: PROSTATE BIOPSY;  Surgeon: Ailene Rud, MD;  Location: [facility];  Service: Urology;  Laterality: N/A;   Family History  Problem Relation Age of Onset  . Diabetes Father   . Diabetes Sister   . Diabetes Brother    Social History  Substance Use Topics  . Smoking status: Never Smoker   . Smokeless tobacco: None  . Alcohol Use: Yes      Review of Systems  Constitutional: Negative for fever and chills.   Gastrointestinal: Negative for nausea, vomiting, abdominal pain, diarrhea and constipation.  Genitourinary: Positive for flank pain. Negative for dysuria, urgency, frequency and hematuria.  All other systems reviewed and are negative.     Allergies  Bee venom; Glucophage; and Shellfish allergy  Home Medications   Prior to Admission medications   Medication Sig Start Date End Date Taking? Authorizing Provider  aspirin 81 MG tablet Take 81 mg by mouth at bedtime.    Yes Historical Provider, MD  buPROPion (WELLBUTRIN) 100 MG tablet Take 100 mg by mouth 2 (two) times daily.    Yes Historical Provider, MD  Cholecalciferol (VITAMIN D3) 1000 UNITS CAPS Take 1 capsule by mouth at bedtime.   Yes Historical Provider, MD  doxepin (SINEQUAN) 25 MG capsule Take 25 mg by mouth at bedtime. 07/19/15  Yes Historical Provider, MD  fexofenadine (ALLEGRA) 60 MG tablet Take 60 mg by mouth daily.    Yes Historical Provider, MD  fish oil-omega-3 fatty acids 1000 MG capsule Take 1 g by mouth 2 (two) times daily.    Yes Historical Provider, MD  insulin lispro protamine-insulin lispro (HUMALOG 75/25) (75-25) 100 UNIT/ML SUSP Inject 8 Units into the skin 2 (two) times daily with a meal. Before breakfast, before supper, and before bedtime. Per sliding scale.  Yes Historical Provider, MD  LEVEMIR FLEXTOUCH 100 UNIT/ML Pen Inject 8 Units into the skin at bedtime. 07/16/15  Yes Historical Provider, MD  lovastatin (MEVACOR) 20 MG tablet Take 20 mg by mouth at bedtime.   Yes Historical Provider, MD  Multiple Vitamin (MULITIVITAMIN WITH MINERALS) TABS Take 1 tablet by mouth at bedtime.   Yes Historical Provider, MD  naproxen sodium (ANAPROX) 220 MG tablet Take 220 mg by mouth 2 (two) times daily with a meal.   Yes Historical Provider, MD  silodosin (RAPAFLO) 8 MG CAPS capsule Take 8 mg by mouth every evening.    Yes Historical Provider, MD  vitamin B-12 (CYANOCOBALAMIN) 1000 MCG tablet Take 1,000 mcg by mouth daily.   Yes  Historical Provider, MD    BP 102/67 mmHg  Pulse 75  Temp(Src) 97.7 F (36.5 C) (Oral)  Resp 18  Ht 5\' 8"  (1.727 m)  Wt 67.586 kg  BMI 22.66 kg/m2  SpO2 97% Physical Exam  Constitutional: He is oriented to person, place, and time. He appears well-developed and well-nourished. No distress.  HENT:  Head: Normocephalic and atraumatic.  Right Ear: External ear normal.  Left Ear: External ear normal.  Nose: Nose normal.  Mouth/Throat: Uvula is midline, oropharynx is clear and moist and mucous membranes are normal.  Eyes: Conjunctivae, EOM and lids are normal. Pupils are equal, round, and reactive to light. Right eye exhibits no discharge. Left eye exhibits no discharge. No scleral icterus.  Neck: Normal range of motion. Neck supple.  Cardiovascular: Normal rate, regular rhythm, normal heart sounds, intact distal pulses and normal pulses.   Pulmonary/Chest: Effort normal and breath sounds normal. No respiratory distress. He has no wheezes. He has no rales.  Abdominal: Soft. Normal appearance and bowel sounds are normal. He exhibits no distension and no mass. There is no tenderness. There is no rigidity, no rebound, no guarding and no CVA tenderness.  Musculoskeletal: Normal range of motion. He exhibits no edema or tenderness.  Mild TTP to right inferior flank. No midline C/T/L spine TTP. No palpable step-off or deformity.  Neurological: He is alert and oriented to person, place, and time. He has normal strength. No cranial nerve deficit or sensory deficit.  Skin: Skin is warm, dry and intact. No rash noted. He is not diaphoretic. No erythema. No pallor.  Psychiatric: He has a normal mood and affect. His speech is normal and behavior is normal.  Nursing note and vitals reviewed.   ED Course  Procedures (including critical care time)  Labs Review Labs Reviewed  CBC WITH DIFFERENTIAL/PLATELET - Abnormal; Notable for the following:    WBC 17.5 (*)    Lymphs Abs 10.8 (*)    All other  components within normal limits  BASIC METABOLIC PANEL - Abnormal; Notable for the following:    Glucose, Bld 161 (*)    BUN 21 (*)    All other components within normal limits  URINALYSIS, ROUTINE W REFLEX MICROSCOPIC (NOT AT [facility]) - Abnormal; Notable for the following:    APPearance CLOUDY (*)    Specific Gravity, Urine 1.035 (*)    Glucose, UA >1000 (*)    Ketones, ur 15 (*)    All other components within normal limits  URINE MICROSCOPIC-ADD ON - Abnormal; Notable for the following:    Squamous Epithelial / LPF 0-5 (*)    Bacteria, UA RARE (*)    All other components within normal limits  URINE CULTURE    Imaging Review Ct Abdomen Pelvis Wo  Contrast  08/02/2015  CLINICAL DATA:  75 year old male with right flank pain for 2 days, increasing. Nausea. Initial encounter. EXAM: CT ABDOMEN AND PELVIS WITHOUT CONTRAST TECHNIQUE: Multidetector CT imaging of the abdomen and pelvis was performed following the standard protocol without IV contrast. COMPARISON:  None. FINDINGS: Minimal atelectasis or scarring at the lung bases. No pericardial or pleural effusion. Osteopenia. Widespread advanced disc degeneration. Severe degenerative endplate changes at 075-GRM. No acute osseous abnormality identified. Aortoiliac calcified atherosclerosis noted. Calcified atherosclerosis continues into the bilateral femoral arteries. No pelvic free fluid. Some retained stool in the rectum. Redundant sigmoid colon with retained stool. Retained stool throughout the left and transverse colon. Redundant colonic flexures. Retained stool to the cecum. Normal terminal ileum. Appendix is diminutive or absent. No dilated small bowel. Decompressed stomach. Negative duodenum; small duodenum diverticulum in the second portion. No abdominal free air or free fluid. Negative non contrast liver, gallbladder, spleen, pancreas and adrenal glands. Punctate right midpole nephrolithiasis. No right hydronephrosis. Mildly prominent right renal  pelvis, but rapid tapering to the ureter which is decompressed throughout its course and negative. Punctate left renal upper pole and midpole nephrolithiasis. No hydronephrosis. Decompressed and negative left ureter. Diminutive and unremarkable urinary bladder. IMPRESSION: 1. Punctate nephrolithiasis with no obstructive uropathy. 2. Retained stool throughout the colon with no inflammatory process identified in the noncontrast abdomen or pelvis. 3. Calcified aortic atherosclerosis. Electronically Signed   By: Genevie Ann M.D.   On: 08/02/2015 11:05     I have personally reviewed and evaluated these images and lab results as part of my medical decision-making.   EKG Interpretation None      MDM   Final diagnoses:  Right flank pain    75 year old male presents with right flank pain. Denies fever, chills, abdominal pain, N/V/D/C, dysuria, urgency, frequency, hematuria. Patient is afebrile. Vital signs stable. Abdomen soft, non-tender, non-distended. No rebound, guarding, or masses. Mild TTP to right inferior flank. No CVA tenderness. Will obtain basic labs, urinalysis, and CT abdomen pelvis without contrast. Patient discussed with Dr. Canary Brim. CBC remarkable for leukocytosis of 17.5, no anemia. BMP unremarkable. UA negative for hemoglobin, no evidence of infection. Patient given toradol and fluids. CT abdomen remarkable for punctate nephrolithiasis with no obstructive uropathy. Spoke with patient regarding findings. Patient is non-toxic and well-appearing, feel he is stable for discharge at this time. Leukocytosis likely due to CLL. Low suspicion for emergent intra-abdominal pathology. Patient to follow-up with PCP. Return precautions discussed. Patient verbalizes his understanding and is in agreement with plan.  BP 102/67 mmHg  Pulse 75  Temp(Src) 97.7 F (36.5 C) (Oral)  Resp 18  Ht 5\' 8"  (1.727 m)  Wt 67.586 kg  BMI 22.66 kg/m2  SpO2 97%     Marella Chimes, PA-C 08/02/15  [place], MD 08/02/15 [phone]

## 2015-08-02 NOTE — ED Notes (Signed)
Patient transported to CT 

## 2015-08-02 NOTE — Discharge Instructions (Signed)
1. Medications: usual home medications 2. Treatment: rest, drink plenty of fluids 3. Follow Up: please followup with your primary doctor within the next week for discussion of your diagnoses and further evaluation after today's visit; if you do not have a primary care doctor use the resource guide provided to find one; please return to the ER for severe pain, new or worsening symptoms   Flank Pain Flank pain refers to pain that is located on the side of the body between the upper abdomen and the back. The pain may occur over a short period of time (acute) or may be long-term or reoccurring (chronic). It may be mild or severe. Flank pain can be caused by many things. CAUSES  Some of the more common causes of flank pain include:  Muscle strains.   Muscle spasms.   A disease of your spine (vertebral disk disease).   A lung infection (pneumonia).   Fluid around your lungs (pulmonary edema).   A kidney infection.   Kidney stones.   A very painful skin rash caused by the chickenpox virus (shingles).   Gallbladder disease.  [place] care will depend on the cause of your pain. In general,  Rest as directed by your caregiver.  Drink enough fluids to keep your urine clear or pale yellow.  Only take over-the-counter or prescription medicines as directed by your caregiver. Some medicines may help relieve the pain.  Tell your caregiver about any changes in your pain.  Follow up with your caregiver as directed. SEEK IMMEDIATE MEDICAL CARE IF:   Your pain is not controlled with medicine.   You have new or worsening symptoms.  Your pain increases.   You have abdominal pain.   You have shortness of breath.   You have persistent nausea or vomiting.   You have swelling in your abdomen.   You feel faint or pass out.   You have blood in your urine.  You have a fever or persistent symptoms for more than 2-3 days.  You have a fever and your  symptoms suddenly get worse. MAKE SURE YOU:   Understand these instructions.  Will watch your condition.  Will get help right away if you are not doing well or get worse.   This information is not intended to replace advice given to you by your health care provider. Make sure you discuss any questions you have with your health care provider.   Document Released: 08/07/2005 Document Revised: 03/10/2012 Document Reviewed: 01/29/2012 Elsevier Interactive Patient Education [employer].

## 2015-08-02 NOTE — ED Notes (Signed)
Family at bedside. 

## 2015-08-02 NOTE — ED Notes (Signed)
Pt aware of urine sample needed, pt will attempt shortly

## 2015-08-03 LAB — URINE CULTURE

## 2015-08-06 DIAGNOSIS — R109 Unspecified abdominal pain: Secondary | ICD-10-CM | POA: Diagnosis not present

## 2015-08-06 DIAGNOSIS — K5900 Constipation, unspecified: Secondary | ICD-10-CM | POA: Diagnosis not present

## 2015-08-06 DIAGNOSIS — N200 Calculus of kidney: Secondary | ICD-10-CM | POA: Diagnosis not present

## 2015-08-09 DIAGNOSIS — F411 Generalized anxiety disorder: Secondary | ICD-10-CM | POA: Diagnosis not present

## 2015-08-16 DIAGNOSIS — F411 Generalized anxiety disorder: Secondary | ICD-10-CM | POA: Diagnosis not present

## 2015-08-21 DIAGNOSIS — Z0000 Encounter for general adult medical examination without abnormal findings: Secondary | ICD-10-CM | POA: Diagnosis not present

## 2015-08-21 DIAGNOSIS — N401 Enlarged prostate with lower urinary tract symptoms: Secondary | ICD-10-CM | POA: Diagnosis not present

## 2015-08-21 DIAGNOSIS — N138 Other obstructive and reflux uropathy: Secondary | ICD-10-CM | POA: Diagnosis not present

## 2015-08-21 DIAGNOSIS — N200 Calculus of kidney: Secondary | ICD-10-CM | POA: Diagnosis not present

## 2015-08-29 DIAGNOSIS — F411 Generalized anxiety disorder: Secondary | ICD-10-CM | POA: Diagnosis not present

## 2015-09-06 DIAGNOSIS — F411 Generalized anxiety disorder: Secondary | ICD-10-CM | POA: Diagnosis not present

## 2015-09-12 DIAGNOSIS — F411 Generalized anxiety disorder: Secondary | ICD-10-CM | POA: Diagnosis not present

## 2015-09-20 DIAGNOSIS — F411 Generalized anxiety disorder: Secondary | ICD-10-CM | POA: Diagnosis not present

## 2015-09-27 DIAGNOSIS — F411 Generalized anxiety disorder: Secondary | ICD-10-CM | POA: Diagnosis not present

## 2015-10-18 DIAGNOSIS — F418 Other specified anxiety disorders: Secondary | ICD-10-CM | POA: Diagnosis not present

## 2015-10-18 DIAGNOSIS — G4700 Insomnia, unspecified: Secondary | ICD-10-CM | POA: Diagnosis not present

## 2015-10-18 DIAGNOSIS — E11319 Type 2 diabetes mellitus with unspecified diabetic retinopathy without macular edema: Secondary | ICD-10-CM | POA: Diagnosis not present

## 2015-10-18 DIAGNOSIS — E1142 Type 2 diabetes mellitus with diabetic polyneuropathy: Secondary | ICD-10-CM | POA: Diagnosis not present

## 2015-10-24 DIAGNOSIS — F411 Generalized anxiety disorder: Secondary | ICD-10-CM | POA: Diagnosis not present

## 2015-10-31 DIAGNOSIS — F411 Generalized anxiety disorder: Secondary | ICD-10-CM | POA: Diagnosis not present

## 2015-11-28 DIAGNOSIS — F411 Generalized anxiety disorder: Secondary | ICD-10-CM | POA: Diagnosis not present

## 2015-12-05 DIAGNOSIS — F411 Generalized anxiety disorder: Secondary | ICD-10-CM | POA: Diagnosis not present

## 2015-12-12 DIAGNOSIS — F411 Generalized anxiety disorder: Secondary | ICD-10-CM | POA: Diagnosis not present

## 2015-12-26 DIAGNOSIS — F411 Generalized anxiety disorder: Secondary | ICD-10-CM | POA: Diagnosis not present

## 2016-01-17 DIAGNOSIS — F411 Generalized anxiety disorder: Secondary | ICD-10-CM | POA: Diagnosis not present

## 2016-02-20 DIAGNOSIS — F411 Generalized anxiety disorder: Secondary | ICD-10-CM | POA: Diagnosis not present

## 2016-03-05 DIAGNOSIS — F411 Generalized anxiety disorder: Secondary | ICD-10-CM | POA: Diagnosis not present

## 2016-03-31 DIAGNOSIS — F411 Generalized anxiety disorder: Secondary | ICD-10-CM | POA: Diagnosis not present

## 2016-04-23 DIAGNOSIS — G4700 Insomnia, unspecified: Secondary | ICD-10-CM | POA: Diagnosis not present

## 2016-04-23 DIAGNOSIS — R2689 Other abnormalities of gait and mobility: Secondary | ICD-10-CM | POA: Diagnosis not present

## 2016-04-23 DIAGNOSIS — E1142 Type 2 diabetes mellitus with diabetic polyneuropathy: Secondary | ICD-10-CM | POA: Diagnosis not present

## 2016-04-23 DIAGNOSIS — Z794 Long term (current) use of insulin: Secondary | ICD-10-CM | POA: Diagnosis not present

## 2016-04-28 DIAGNOSIS — H3561 Retinal hemorrhage, right eye: Secondary | ICD-10-CM | POA: Diagnosis not present

## 2016-04-28 DIAGNOSIS — H3562 Retinal hemorrhage, left eye: Secondary | ICD-10-CM | POA: Diagnosis not present

## 2016-04-28 DIAGNOSIS — E113551 Type 2 diabetes mellitus with stable proliferative diabetic retinopathy, right eye: Secondary | ICD-10-CM | POA: Diagnosis not present

## 2016-04-28 DIAGNOSIS — E113392 Type 2 diabetes mellitus with moderate nonproliferative diabetic retinopathy without macular edema, left eye: Secondary | ICD-10-CM | POA: Diagnosis not present

## 2016-05-01 ENCOUNTER — Ambulatory Visit: Payer: PRIVATE HEALTH INSURANCE

## 2016-05-06 DIAGNOSIS — Z23 Encounter for immunization: Secondary | ICD-10-CM | POA: Diagnosis not present

## 2016-05-06 DIAGNOSIS — M6281 Muscle weakness (generalized): Secondary | ICD-10-CM | POA: Diagnosis not present

## 2016-05-12 DIAGNOSIS — M6281 Muscle weakness (generalized): Secondary | ICD-10-CM | POA: Diagnosis not present

## 2016-05-15 DIAGNOSIS — M6281 Muscle weakness (generalized): Secondary | ICD-10-CM | POA: Diagnosis not present

## 2016-05-19 DIAGNOSIS — M6281 Muscle weakness (generalized): Secondary | ICD-10-CM | POA: Diagnosis not present

## 2016-05-21 DIAGNOSIS — M6281 Muscle weakness (generalized): Secondary | ICD-10-CM | POA: Diagnosis not present

## 2016-05-27 DIAGNOSIS — M6281 Muscle weakness (generalized): Secondary | ICD-10-CM | POA: Diagnosis not present

## 2016-07-14 DIAGNOSIS — H6123 Impacted cerumen, bilateral: Secondary | ICD-10-CM | POA: Diagnosis not present

## 2016-07-23 DIAGNOSIS — N400 Enlarged prostate without lower urinary tract symptoms: Secondary | ICD-10-CM | POA: Diagnosis not present

## 2016-07-23 DIAGNOSIS — G4700 Insomnia, unspecified: Secondary | ICD-10-CM | POA: Diagnosis not present

## 2016-07-23 DIAGNOSIS — F418 Other specified anxiety disorders: Secondary | ICD-10-CM | POA: Diagnosis not present

## 2016-07-23 DIAGNOSIS — Z0000 Encounter for general adult medical examination without abnormal findings: Secondary | ICD-10-CM | POA: Diagnosis not present

## 2016-07-23 DIAGNOSIS — E785 Hyperlipidemia, unspecified: Secondary | ICD-10-CM | POA: Diagnosis not present

## 2016-07-23 DIAGNOSIS — Z125 Encounter for screening for malignant neoplasm of prostate: Secondary | ICD-10-CM | POA: Diagnosis not present

## 2016-07-23 DIAGNOSIS — E1142 Type 2 diabetes mellitus with diabetic polyneuropathy: Secondary | ICD-10-CM | POA: Diagnosis not present

## 2016-07-24 DIAGNOSIS — M6281 Muscle weakness (generalized): Secondary | ICD-10-CM | POA: Diagnosis not present

## 2016-07-25 DIAGNOSIS — M6281 Muscle weakness (generalized): Secondary | ICD-10-CM | POA: Diagnosis not present

## 2016-07-31 DIAGNOSIS — M6281 Muscle weakness (generalized): Secondary | ICD-10-CM | POA: Diagnosis not present

## 2016-08-05 DIAGNOSIS — M6281 Muscle weakness (generalized): Secondary | ICD-10-CM | POA: Diagnosis not present

## 2016-08-07 DIAGNOSIS — M6281 Muscle weakness (generalized): Secondary | ICD-10-CM | POA: Diagnosis not present

## 2016-08-08 DIAGNOSIS — Z0000 Encounter for general adult medical examination without abnormal findings: Secondary | ICD-10-CM | POA: Diagnosis not present

## 2016-08-08 DIAGNOSIS — Z125 Encounter for screening for malignant neoplasm of prostate: Secondary | ICD-10-CM | POA: Diagnosis not present

## 2016-08-08 DIAGNOSIS — E785 Hyperlipidemia, unspecified: Secondary | ICD-10-CM | POA: Diagnosis not present

## 2016-09-01 DIAGNOSIS — F411 Generalized anxiety disorder: Secondary | ICD-10-CM | POA: Diagnosis not present

## 2016-09-23 DIAGNOSIS — R3914 Feeling of incomplete bladder emptying: Secondary | ICD-10-CM | POA: Diagnosis not present

## 2016-09-23 DIAGNOSIS — R350 Frequency of micturition: Secondary | ICD-10-CM | POA: Diagnosis not present

## 2016-09-23 DIAGNOSIS — E118 Type 2 diabetes mellitus with unspecified complications: Secondary | ICD-10-CM | POA: Diagnosis not present

## 2016-10-10 DIAGNOSIS — R634 Abnormal weight loss: Secondary | ICD-10-CM | POA: Diagnosis not present

## 2016-10-10 DIAGNOSIS — G4700 Insomnia, unspecified: Secondary | ICD-10-CM | POA: Diagnosis not present

## 2016-10-10 DIAGNOSIS — Z7984 Long term (current) use of oral hypoglycemic drugs: Secondary | ICD-10-CM | POA: Diagnosis not present

## 2016-10-10 DIAGNOSIS — R41840 Attention and concentration deficit: Secondary | ICD-10-CM | POA: Diagnosis not present

## 2016-10-10 DIAGNOSIS — E1142 Type 2 diabetes mellitus with diabetic polyneuropathy: Secondary | ICD-10-CM | POA: Diagnosis not present

## 2016-10-24 DIAGNOSIS — Z794 Long term (current) use of insulin: Secondary | ICD-10-CM | POA: Diagnosis not present

## 2016-10-24 DIAGNOSIS — E1142 Type 2 diabetes mellitus with diabetic polyneuropathy: Secondary | ICD-10-CM | POA: Diagnosis not present

## 2016-11-21 DIAGNOSIS — H903 Sensorineural hearing loss, bilateral: Secondary | ICD-10-CM | POA: Diagnosis not present

## 2016-11-21 DIAGNOSIS — H9313 Tinnitus, bilateral: Secondary | ICD-10-CM | POA: Diagnosis not present

## 2016-11-21 DIAGNOSIS — H6122 Impacted cerumen, left ear: Secondary | ICD-10-CM | POA: Diagnosis not present

## 2016-12-03 DIAGNOSIS — F411 Generalized anxiety disorder: Secondary | ICD-10-CM | POA: Diagnosis not present

## 2016-12-09 DIAGNOSIS — G5603 Carpal tunnel syndrome, bilateral upper limbs: Secondary | ICD-10-CM | POA: Diagnosis not present

## 2016-12-09 DIAGNOSIS — M79642 Pain in left hand: Secondary | ICD-10-CM | POA: Diagnosis not present

## 2016-12-09 DIAGNOSIS — M79641 Pain in right hand: Secondary | ICD-10-CM | POA: Diagnosis not present

## 2016-12-10 DIAGNOSIS — F411 Generalized anxiety disorder: Secondary | ICD-10-CM | POA: Diagnosis not present

## 2016-12-18 DIAGNOSIS — E1165 Type 2 diabetes mellitus with hyperglycemia: Secondary | ICD-10-CM | POA: Diagnosis not present

## 2016-12-18 DIAGNOSIS — E7800 Pure hypercholesterolemia, unspecified: Secondary | ICD-10-CM | POA: Diagnosis not present

## 2016-12-18 DIAGNOSIS — E1140 Type 2 diabetes mellitus with diabetic neuropathy, unspecified: Secondary | ICD-10-CM | POA: Diagnosis not present

## 2016-12-18 DIAGNOSIS — E11319 Type 2 diabetes mellitus with unspecified diabetic retinopathy without macular edema: Secondary | ICD-10-CM | POA: Diagnosis not present

## 2016-12-19 DIAGNOSIS — G4700 Insomnia, unspecified: Secondary | ICD-10-CM | POA: Diagnosis not present

## 2016-12-19 DIAGNOSIS — F418 Other specified anxiety disorders: Secondary | ICD-10-CM | POA: Diagnosis not present

## 2016-12-23 DIAGNOSIS — G5621 Lesion of ulnar nerve, right upper limb: Secondary | ICD-10-CM | POA: Insufficient documentation

## 2016-12-23 DIAGNOSIS — G5603 Carpal tunnel syndrome, bilateral upper limbs: Secondary | ICD-10-CM | POA: Diagnosis not present

## 2016-12-23 DIAGNOSIS — G5623 Lesion of ulnar nerve, bilateral upper limbs: Secondary | ICD-10-CM | POA: Diagnosis not present

## 2017-01-23 DIAGNOSIS — F411 Generalized anxiety disorder: Secondary | ICD-10-CM | POA: Diagnosis not present

## 2017-02-24 DIAGNOSIS — R3914 Feeling of incomplete bladder emptying: Secondary | ICD-10-CM | POA: Diagnosis not present

## 2017-02-24 DIAGNOSIS — R350 Frequency of micturition: Secondary | ICD-10-CM | POA: Diagnosis not present

## 2017-02-25 DIAGNOSIS — E1142 Type 2 diabetes mellitus with diabetic polyneuropathy: Secondary | ICD-10-CM | POA: Diagnosis not present

## 2017-02-25 DIAGNOSIS — R634 Abnormal weight loss: Secondary | ICD-10-CM | POA: Diagnosis not present

## 2017-02-25 DIAGNOSIS — G4700 Insomnia, unspecified: Secondary | ICD-10-CM | POA: Diagnosis not present

## 2017-02-25 DIAGNOSIS — N3281 Overactive bladder: Secondary | ICD-10-CM | POA: Diagnosis not present

## 2017-03-04 DIAGNOSIS — F411 Generalized anxiety disorder: Secondary | ICD-10-CM | POA: Diagnosis not present

## 2017-03-04 DIAGNOSIS — Z23 Encounter for immunization: Secondary | ICD-10-CM | POA: Diagnosis not present

## 2017-03-12 DIAGNOSIS — E1142 Type 2 diabetes mellitus with diabetic polyneuropathy: Secondary | ICD-10-CM | POA: Diagnosis not present

## 2017-03-12 DIAGNOSIS — G4700 Insomnia, unspecified: Secondary | ICD-10-CM | POA: Diagnosis not present

## 2017-03-12 DIAGNOSIS — N3281 Overactive bladder: Secondary | ICD-10-CM | POA: Diagnosis not present

## 2017-03-31 ENCOUNTER — Ambulatory Visit: Payer: PRIVATE HEALTH INSURANCE | Admitting: Registered"

## 2017-04-02 ENCOUNTER — Encounter: Payer: Self-pay | Admitting: Registered"

## 2017-04-02 ENCOUNTER — Encounter: Payer: Medicare Other | Attending: Family Medicine | Admitting: Registered"

## 2017-04-02 DIAGNOSIS — Z713 Dietary counseling and surveillance: Secondary | ICD-10-CM | POA: Diagnosis not present

## 2017-04-02 DIAGNOSIS — Z794 Long term (current) use of insulin: Secondary | ICD-10-CM | POA: Insufficient documentation

## 2017-04-02 DIAGNOSIS — E118 Type 2 diabetes mellitus with unspecified complications: Secondary | ICD-10-CM

## 2017-04-02 DIAGNOSIS — E1142 Type 2 diabetes mellitus with diabetic polyneuropathy: Secondary | ICD-10-CM | POA: Diagnosis not present

## 2017-04-02 DIAGNOSIS — E119 Type 2 diabetes mellitus without complications: Secondary | ICD-10-CM | POA: Insufficient documentation

## 2017-04-02 NOTE — Progress Notes (Signed)
Diabetes Self-Management Education  Visit Type: First/Initial  Appt. Start Time: 1040 Appt. End Time: 1200  04/02/2017  [name], identified by name and date of birth, is a 76 y.o. male with a diagnosis of Diabetes: Type 2.   ASSESSMENT Per chart A1c of 9.9% is down from 10/10/16 A1c 12.4%. Pt states FBG averages less than 150 mg/dL and in the month of may was consistently below 150 mg/dL. However, pt states other readings are largely between 300-400 mg/dL. Pt states he warms up frozen food and canned food and wants to have meal plans he can follow. When patient provided diet recall of breakfast he was very exacting in calcuation of what he eats in terms of volume and grams of fat and carbohydrates and appears he has retained much of the information provided in previous diabetes education sessions. Pt states he has been losing about 1 lb a month and he doesn't want to lose any more weight so he is also drinking 1/2 cup regular juice for the calories as wells as to make his "mouth taste better."   Pt reports having had T2DM since 1964. Pt states he lost control of DM when wife's Alzheimer's dementia advanced and he was her primary caregiver for many years. Pt appears very distressed with his wife's current state requiring nursing home for the last 2 years where he states she is very unhappy. Pt visits her every other day and several times repeated the driving directions to her facility and expressing gratetitude that he can remember how to get there which is a 9 mile drive from his home. Pt states he sometimes gets lost when driving and was clearly distressed about a recent incident of not being about to get out of a facilities parking lot using written directions and his GPS. Pt states his short-term memory is shot and plans to get a full neurological workup in the near future. Pt states other providers have suggested assisted living, but patient states he is does not want that. RD provided some  information about the PACE program.  Pt states he and his wife moved to this area from [place] at the request of his son who lives here. Pt reports that he sees his son about once a month.  Pt states understanding that high blood sugar can contribute to frequent urination and reports starting a 1 month trial of Myrbetriq. Pt states even without the frequent urination, he has experienced insomonia for decades and plans to ask his doctor to increased temazepam.      Diabetes Self-Management Education - 04/02/17 1043      Visit Information   Visit Type First/Initial     Initial Visit   Diabetes Type Type 2   Are you currently following a meal plan? No   Date Diagnosed 2014     Psychosocial Assessment   Patient Belief/Attitude about Diabetes Motivated to manage diabetes   Self-care barriers Other (comment)  pt states short-term memory is shot   How often do you need to have someone help you when you read instructions, pamphlets, or other written materials from your doctor or pharmacy? --  only inconsiderate abbrevs.   What is the last grade level you completed in school? MSECE     Complications   Last HgB A1C per patient/outside source 9.9 %  per referral   How often do you check your blood sugar? 3-4 times/day   Fasting Blood glucose range (mg/dL) 130-179  avg less than 150  Postprandial Blood glucose range (mg/dL) >200   Number of hyperglycemic episodes per week --  "largely between 300-400" per pt   Have you had a dilated eye exam in the past 12 months? Yes  in 2 days   Have you had a dental exam in the past 12 months? No   Are you checking your feet? Yes   How many days per week are you checking your feet? 3     Dietary Intake   Breakfast 2 eggs, [place] bacon, "3 mm slice" of chedar cheese, 1/2 glass juice     Exercise   Exercise Type ADL's   How many days per week to you exercise? 0   How many minutes per day do you exercise? 0   Total minutes per week of exercise 0      Patient Education   Previous Diabetes Education Yes (please comment)  2014   Psychosocial adjustment Worked with patient to identify barriers to care and solutions;Helped patient identify a support system for diabetes management     Individualized Goals (developed by patient)   Nutrition General guidelines for healthy choices and portions discussed;Other (comment)  add nutritional supplements to prevent further weight loss     Outcomes   Expected Outcomes Demonstrated interest in learning. Expect positive outcomes   Future DMSE PRN   Program Status Completed    Individualized Plan for Diabetes Self-Management Training:   Learning Objective:  Patient will have a greater understanding of diabetes self-management. Patient education plan is to attend individual and/or group sessions per assessed needs and concerns.  Patient Instructions  Boost Glucose Control 1-2 per day in addition to meals may hep stop weight loss Diet Cranberry juice may be a good alternative to the juice you are drinking Aim to eat protein anytime you have a carbohydrate Can use the meal plan for ideas for balanced meals [employer] is a home delivery service that may be an option. [phone] Ask your son if he could help you look into the [facility]ACE program in [place] to see if it would be a good resource for you. [facility] [place] [phone]  [email or web address]  Expected Outcomes:  Demonstrated interest in learning. Expect positive outcomes  Education material provided: Diabetes Meal Plan, PACE program copy of webpage  If problems or questions, patient to contact team via:  Phone  Future DSME appointment: PRN

## 2017-04-02 NOTE — Patient Instructions (Addendum)
Boost Glucose Control 1-2 per day in addition to meals may hep stop weight loss Diet Cranberry juice may be a good alternative to the juice you are drinking Aim to eat protein anytime you have a carbohydrate Can use the meal plan for ideas for balanced meals [employer] is a home delivery service that may be an option. [phone] Ask your son if he could help you look into the PACE program in [place] to see if it would be a good resource for you. [facility] [place] [phone]  [email or web address]

## 2017-04-07 DIAGNOSIS — F411 Generalized anxiety disorder: Secondary | ICD-10-CM | POA: Diagnosis not present

## 2017-04-08 DIAGNOSIS — N400 Enlarged prostate without lower urinary tract symptoms: Secondary | ICD-10-CM | POA: Diagnosis not present

## 2017-04-08 DIAGNOSIS — N3281 Overactive bladder: Secondary | ICD-10-CM | POA: Diagnosis not present

## 2017-04-15 DIAGNOSIS — R2689 Other abnormalities of gait and mobility: Secondary | ICD-10-CM | POA: Diagnosis not present

## 2017-04-15 DIAGNOSIS — E1142 Type 2 diabetes mellitus with diabetic polyneuropathy: Secondary | ICD-10-CM | POA: Diagnosis not present

## 2017-04-15 DIAGNOSIS — G4700 Insomnia, unspecified: Secondary | ICD-10-CM | POA: Diagnosis not present

## 2017-04-15 DIAGNOSIS — N3281 Overactive bladder: Secondary | ICD-10-CM | POA: Diagnosis not present

## 2017-04-16 DIAGNOSIS — F411 Generalized anxiety disorder: Secondary | ICD-10-CM | POA: Diagnosis not present

## 2017-04-21 DIAGNOSIS — H6123 Impacted cerumen, bilateral: Secondary | ICD-10-CM | POA: Diagnosis not present

## 2017-04-28 DIAGNOSIS — H3562 Retinal hemorrhage, left eye: Secondary | ICD-10-CM | POA: Diagnosis not present

## 2017-04-28 DIAGNOSIS — H43812 Vitreous degeneration, left eye: Secondary | ICD-10-CM | POA: Diagnosis not present

## 2017-04-28 DIAGNOSIS — E113551 Type 2 diabetes mellitus with stable proliferative diabetic retinopathy, right eye: Secondary | ICD-10-CM | POA: Diagnosis not present

## 2017-04-28 DIAGNOSIS — E113392 Type 2 diabetes mellitus with moderate nonproliferative diabetic retinopathy without macular edema, left eye: Secondary | ICD-10-CM | POA: Diagnosis not present

## 2017-04-29 DIAGNOSIS — F411 Generalized anxiety disorder: Secondary | ICD-10-CM | POA: Diagnosis not present

## 2017-05-06 DIAGNOSIS — N400 Enlarged prostate without lower urinary tract symptoms: Secondary | ICD-10-CM | POA: Diagnosis not present

## 2017-05-11 DIAGNOSIS — E119 Type 2 diabetes mellitus without complications: Secondary | ICD-10-CM | POA: Diagnosis not present

## 2017-05-11 DIAGNOSIS — E113293 Type 2 diabetes mellitus with mild nonproliferative diabetic retinopathy without macular edema, bilateral: Secondary | ICD-10-CM | POA: Diagnosis not present

## 2017-05-11 DIAGNOSIS — H35033 Hypertensive retinopathy, bilateral: Secondary | ICD-10-CM | POA: Diagnosis not present

## 2017-05-11 DIAGNOSIS — H40013 Open angle with borderline findings, low risk, bilateral: Secondary | ICD-10-CM | POA: Diagnosis not present

## 2017-05-11 DIAGNOSIS — Z961 Presence of intraocular lens: Secondary | ICD-10-CM | POA: Diagnosis not present

## 2017-06-11 DIAGNOSIS — F411 Generalized anxiety disorder: Secondary | ICD-10-CM | POA: Diagnosis not present

## 2017-06-12 DIAGNOSIS — E785 Hyperlipidemia, unspecified: Secondary | ICD-10-CM | POA: Diagnosis not present

## 2017-06-12 DIAGNOSIS — G4700 Insomnia, unspecified: Secondary | ICD-10-CM | POA: Diagnosis not present

## 2017-06-12 DIAGNOSIS — R2689 Other abnormalities of gait and mobility: Secondary | ICD-10-CM | POA: Diagnosis not present

## 2017-06-12 DIAGNOSIS — E1142 Type 2 diabetes mellitus with diabetic polyneuropathy: Secondary | ICD-10-CM | POA: Diagnosis not present

## 2017-06-12 DIAGNOSIS — R413 Other amnesia: Secondary | ICD-10-CM | POA: Diagnosis not present

## 2017-06-19 ENCOUNTER — Ambulatory Visit: Payer: Medicare Other | Attending: Family Medicine | Admitting: Physical Therapy

## 2017-06-19 ENCOUNTER — Encounter: Payer: Self-pay | Admitting: Physical Therapy

## 2017-06-19 DIAGNOSIS — R42 Dizziness and giddiness: Secondary | ICD-10-CM

## 2017-06-19 DIAGNOSIS — Z9181 History of falling: Secondary | ICD-10-CM | POA: Insufficient documentation

## 2017-06-19 DIAGNOSIS — M6281 Muscle weakness (generalized): Secondary | ICD-10-CM | POA: Insufficient documentation

## 2017-06-19 DIAGNOSIS — R2681 Unsteadiness on feet: Secondary | ICD-10-CM | POA: Insufficient documentation

## 2017-06-19 NOTE — Therapy (Signed)
[facility] [address] [place], [place], [address] Phone: [phone]   Fax:  [phone]  Physical Therapy Evaluation  Patient Details  Name: Hayden [name] MRN: [id number] Date of Birth: 1940/07/12 Referring Provider: London Pepper, MD    Encounter Date: 06/19/2017  PT End of Session - 06/19/17 0852    Visit Number  1    Number of Visits  16    Date for PT Re-Evaluation  08/14/17    Authorization Type   Medicare A and B     Authorization Time Period  06/19/17 to 08/14/17    Authorization - Visit Number  1    Authorization - Number of Visits  10    PT Start Time  0845    PT Stop Time  0927    PT Time Calculation (min)  42 min    Activity Tolerance  No increased pain;Patient tolerated treatment well    Behavior During Therapy  [facility] for tasks assessed/performed       Past Medical History:  Diagnosis Date  . Depression   . Diabetes mellitus   . Fibromyalgia   . GERD (gastroesophageal reflux disease)    reflux intermittent  . Hyperlipidemia   . 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 ADENOIDECTOMY    . CARPAL TUNNEL RELEASE     rt hand  . CATARACT EXTRACTION, BILATERAL    . CERVICAL FUSION  1998  . CYSTOSCOPY  07/26/2012   Procedure: CYSTOSCOPY;  Surgeon: Ailene Rud, MD;  Location: [facility];  Service: Urology;  Laterality: N/A;  DIAGNOSTIC CYSTO PROSTATE ULTRASOUND      . PROSTATE BIOPSY  07/26/2012   Procedure: PROSTATE BIOPSY;  Surgeon: Ailene Rud, MD;  Location: [facility];  Service: Urology;  Laterality: N/A;  . retinal micro aneurysms    . tonsil    . VASECTOMY    . VITRECTOMY      There were no vitals filed for this visit.   Subjective Assessment - 06/19/17 0849    Subjective  Pt reports that several weeks ago he was having alot of issues with his  balance and mobility, due to poor diabetes management and struggling with placing his wife in a long term care facility. He does feel that his mobility has improved alot since then, but he still needs to have some help with it. He does not use any assistive devices and lives by himself. His son will come over frequently to help him out with things.     Patient is accompained by:  Family member    Pertinent History  Diabetes, depression     Limitations  Walking    Currently in Pain?  No/denies         [facility] PT Assessment - 06/19/17 0001      Assessment   Medical Diagnosis  Other abnormalities of gait and mobility     Referring Provider  London Pepper, MD     Prior Therapy  yes, OPPT a while back       Precautions   Precautions  None    Precaution Comments  hearing aid      Balance Screen   Has the patient fallen in the past 6 months  Yes    How many times?  2 in the house    Has the patient had a decrease  in activity level because of a fear of falling?   Yes    Is the patient reluctant to leave their home because of a fear of falling?   No      Home Environment   Living Environment  Private residence    Living Arrangements  Alone      Prior Function   Level of [place] with basic ADLs      Observation/Other Assessments   Observations  hard of hearing      Posture/Postural Control   Posture Comments  rounded shoulders, forward head       ROM / Strength   AROM / PROM / Strength  AROM;Strength      Strength   Strength Assessment Site  Hip;Knee;Ankle    Right/Left Hip  Right;Left    Right Hip Flexion  4/5    Right Hip Extension  4+/5    Right Hip ABduction  4/5    Left Hip Flexion  4/5    Left Hip Extension  4+/5    Left Hip ABduction  4/5    Right/Left Knee  Right;Left    Right Knee Flexion  4+/5    Right Knee Extension  5/5    Left Knee Flexion  4+/5    Left Knee Extension  5/5    Right/Left Ankle  Right;Left    Right Ankle Dorsiflexion  5/5     Left Ankle Dorsiflexion  5/5      Flexibility   Soft Tissue Assessment /Muscle Length  yes    Hamstrings  40 deg lacking with 90/90 testing       Transfers   Five time sit to stand comments   16 sec (+) dizziness noted       Standardized Balance Assessment   Standardized Balance Assessment  Timed Up and Go Test;Berg Balance Test      Berg Balance Test   Sit to Stand  Able to stand without using hands and stabilize independently    Standing Unsupported  Able to stand safely 2 minutes    Sitting with Back Unsupported but Feet Supported on Floor or Stool  Able to sit safely and securely 2 minutes    Stand to Sit  Sits safely with minimal use of hands    Transfers  Able to transfer safely, minor use of hands    Standing Unsupported with Eyes Closed  Able to stand 10 seconds safely    Standing Ubsupported with Feet Together  Able to place feet together independently and stand 1 minute safely    From Standing, Reach Forward with Outstretched Arm  Can reach forward >12 cm safely (5")    From Standing Position, Pick up Object from Floor  Able to pick up shoe, needs supervision some dizziness reported    From Standing Position, Turn to Look Behind Over each Shoulder  Looks behind one side only/other side shows less weight shift    Turn 360 Degrees  Able to turn 360 degrees safely but slowly    Standing Unsupported, Alternately Place Feet on Step/Stool  Able to stand independently and complete 8 steps >20 seconds    Standing Unsupported, One Foot in Front  Needs help to step but can hold 15 seconds    Standing on One Leg  Tries to lift leg/unable to hold 3 seconds but remains standing independently    Total Score  44      High Level Balance   High Level Balance Comments  difficulty walking and carrying conversation with therapist, drifting to the Rt or Lt during ambulation            Objective measurements completed on examination: See above findings.              PT  Education - 06/19/17 1105    Education provided  Yes    Education Details  results of balance testing; POC moving forward; recommended PT     Person(s) Educated  Patient    Methods  Explanation    Comprehension  Verbalized understanding       PT Short Term Goals - 06/19/17 0954      PT SHORT TERM GOAL #1   Title  Therapist will complete DGI to further evaluate pt's dynamic balance and risk of activity and updated goals appropriately.     Time  2    Period  Weeks    Status  New    Target Date  07/03/17      PT SHORT TERM GOAL #2   Title  Pt will be demo consistency and independence with his HEP to improve strength and balance    Time  2    Period  Weeks    Status  New    Target Date  07/03/17      PT SHORT TERM GOAL #3   Title  Pt will complete 5x sit to stand in less than 14 sec without UE support, to reflect and improvement in his functional strength and power.     Time  4    Period  Weeks    Status  New    Target Date  07/17/17        PT Long Term Goals - 06/19/17 0959      PT LONG TERM GOAL #1   Title  Pt will demo improved BLE strength to atleast 5/5 MMT which will increase his safety and ability to complete daily tasks.     Time  8    Period  Weeks    Status  New    Target Date  08/14/17      PT LONG TERM GOAL #2   Title  Pt will maintain single leg balance on each LE for atleast 5 sec without LOB, 2/3 trials, to improve his safety with stair negotiation and other daily actiivty.     Time  8    Period  Weeks    Status  New      PT LONG TERM GOAL #3   Title  Pt will demo improved hamstring flexibility to lacking no more than 30 deg with 90/90 testing to improve his sitting posture.    Time  8    Period  Weeks    Status  New      PT LONG TERM GOAL #4   Title  Pt will complete 5x sit to stand in less than 14 sec without UE support, to reflect and improvement in his functional strength and power.     Time  8    Period  Weeks    Status  New      PT LONG  TERM GOAL #5   Title  Pt will score atleast 8 points higher on the Berg balance test which will place him at a lower risk of falling and causing injury with activity.     Time  8    Period  Weeks    Status  New  Plan - 06/30/17 0925    Clinical Impression Statement  Pt is a pleasant 76 y.o M referred to OPPT for evaluation and management of recently noted issues with balance and unsteadiness during activity. Pt's son accompanied him to the session however the pt was able to serve as his own historian. He currently lives alone with intermittent assistance from his family, he does not use any assistive devices, and reports 2 falls over the past couple of months without injury. He demonstrates mild limitations in hip strength, limited hip flexibility, and decreased functional strength with 5 x sit to stand and scored 44/56 on the Berg balance test which placed him in a higher risk of falling. He does note occasional dizziness with position changes and is unable to complete even simple balance tasks with addition of cognitive task or distraction. He would benefit from skilled PT to address his limitations in strength, flexibility and proprioception in order to improve his independence and safety with daily activity.     History and Personal Factors relevant to plan of care:  memory issues but still living independently with family assistance    Clinical Presentation  Evolving    Clinical Presentation due to:  worsening balance and mobility    Clinical Decision Making  Moderate    Rehab Potential  Good    PT Frequency  2x / week    PT Duration  8 weeks    PT Treatment/Interventions  ADLs/Self Care Home Management;Moist Heat;Cryotherapy;Balance training;Therapeutic exercise;Therapeutic activities;Gait training;Neuromuscular re-education;Stair training;Functional mobility training;Patient/family education;Manual techniques;Passive range of motion;Vestibular    PT Next Visit Plan  check  orthostatic BP; DGI; hip strengthening, balance progressions and addition to of dual tasking as able     PT Home Exercise Plan  next visit     Recommended Other Services  may need to rule out vestibular component     Consulted and Agree with Plan of Care  Patient;Family member/caregiver    Family Member Consulted  pt's son        Patient will benefit from skilled therapeutic intervention in order to improve the following deficits and impairments:  Abnormal gait, Decreased activity tolerance, Decreased balance, Difficulty walking, Impaired flexibility, Hypomobility, Decreased strength, Decreased mobility, Postural dysfunction, Improper body mechanics  Visit Diagnosis: Unsteadiness on feet  Muscle weakness (generalized)  History of falling  Dizziness and giddiness  G-Codes - 2017/06/30 0944    Functional Assessment Tool Used (Outpatient Only)  Clinical judgement based on assessment of strength, balance testing    Functional Limitation  Mobility: Walking and moving around    Mobility: Walking and Moving Around Current Status (Q6761)  At least 40 percent but less than 60 percent impaired, limited or restricted    Mobility: Walking and Moving Around Goal Status [phone])  At least 20 percent but less than 40 percent impaired, limited or restricted        Problem List Patient Active Problem List   Diagnosis Date Noted  . Type 2 diabetes mellitus with complication ([place]) 26/71/2458  . CLL (chronic lymphocytic leukemia) ([place]) 09/01/2013    11:22 AM,30-Jun-2017 Elly Modena PT, DPT [place] [facility] [address] [place], [place], [address] Phone: [phone]   Fax:  [phone]  Name: Hayden [name] MRN: [id number] Date of Birth: 1941/05/24

## 2017-06-24 ENCOUNTER — Ambulatory Visit: Payer: Medicare Other | Admitting: Physical Therapy

## 2017-07-01 ENCOUNTER — Telehealth: Payer: Self-pay | Admitting: Physical Therapy

## 2017-07-01 ENCOUNTER — Ambulatory Visit: Payer: Medicare Other | Attending: Family Medicine | Admitting: Physical Therapy

## 2017-07-01 DIAGNOSIS — M6281 Muscle weakness (generalized): Secondary | ICD-10-CM | POA: Insufficient documentation

## 2017-07-01 DIAGNOSIS — R42 Dizziness and giddiness: Secondary | ICD-10-CM | POA: Insufficient documentation

## 2017-07-01 DIAGNOSIS — Z9181 History of falling: Secondary | ICD-10-CM | POA: Insufficient documentation

## 2017-07-01 DIAGNOSIS — R2681 Unsteadiness on feet: Secondary | ICD-10-CM | POA: Insufficient documentation

## 2017-07-01 NOTE — Telephone Encounter (Signed)
Called and spoke with pt's son regarding missed appointment today. He states that he is currently out of town and his father is having difficulty remembering appointments. He confirmed that he will be at his upcoming appointment on 07/06/17.   4:39 PM,07/01/17 Elly Modena PT, [place] [facility] [place]

## 2017-07-02 DIAGNOSIS — E1165 Type 2 diabetes mellitus with hyperglycemia: Secondary | ICD-10-CM | POA: Diagnosis not present

## 2017-07-02 DIAGNOSIS — Z794 Long term (current) use of insulin: Secondary | ICD-10-CM | POA: Diagnosis not present

## 2017-07-02 DIAGNOSIS — E1142 Type 2 diabetes mellitus with diabetic polyneuropathy: Secondary | ICD-10-CM | POA: Diagnosis not present

## 2017-07-06 ENCOUNTER — Ambulatory Visit: Payer: Medicare Other | Admitting: Physical Therapy

## 2017-07-06 DIAGNOSIS — Z9181 History of falling: Secondary | ICD-10-CM | POA: Diagnosis not present

## 2017-07-06 DIAGNOSIS — R42 Dizziness and giddiness: Secondary | ICD-10-CM | POA: Diagnosis not present

## 2017-07-06 DIAGNOSIS — R2681 Unsteadiness on feet: Secondary | ICD-10-CM | POA: Diagnosis not present

## 2017-07-06 DIAGNOSIS — M6281 Muscle weakness (generalized): Secondary | ICD-10-CM | POA: Diagnosis not present

## 2017-07-06 NOTE — Therapy (Addendum)
[facility] [address] [place], [place], [address] Phone: [phone]   Fax:  [phone]  Physical Therapy Treatment/Discharge  Patient Details  Name: Hayden [name] MRN: [id number] Date of Birth: 1941-02-15 Referring Provider: London Pepper, MD    Encounter Date: 07/06/2017  PT End of Session - 07/06/17 1418    Visit Number  2    Number of Visits  16    Date for PT Re-Evaluation  08/14/17    Authorization Type   Medicare A and B     Authorization Time Period  06/19/17 to 08/14/17    Authorization - Visit Number  2    Authorization - Number of Visits  10    PT Start Time  5361 Pt arrived late to appointment    PT Stop Time  1444    PT Time Calculation (min)  32 min    Activity Tolerance  No increased pain;Patient tolerated treatment well    Behavior During Therapy  [facility] for tasks assessed/performed       Past Medical History:  Diagnosis Date  . Depression   . Diabetes mellitus   . Fibromyalgia   . GERD (gastroesophageal reflux disease)    reflux intermittent  . Hyperlipidemia   . 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 ADENOIDECTOMY    . CARPAL TUNNEL RELEASE     rt hand  . CATARACT EXTRACTION, BILATERAL    . CERVICAL FUSION  1998  . CYSTOSCOPY  07/26/2012   Procedure: CYSTOSCOPY;  Surgeon: Ailene Rud, MD;  Location: [facility];  Service: Urology;  Laterality: N/A;  DIAGNOSTIC CYSTO PROSTATE ULTRASOUND      . PROSTATE BIOPSY  07/26/2012   Procedure: PROSTATE BIOPSY;  Surgeon: Ailene Rud, MD;  Location: [facility];  Service: Urology;  Laterality: N/A;  . retinal micro aneurysms    . tonsil    . VASECTOMY    . VITRECTOMY      There were no vitals filed for this visit.  Subjective Assessment - 07/06/17 1411    Subjective  Pt reports things are going well. He is  still having issues with his short term memory. No falls recently, does note that he was having some dizziness yesterday, but not bad today.     Patient is accompained by:  Family member    Pertinent History  Diabetes, depression     Limitations  Walking    Currently in Pain?  No/denies         [facility] PT Assessment - 07/06/17 0001      Standardized Balance Assessment   Standardized Balance Assessment  Dynamic Gait Index      Dynamic Gait Index   Level Surface  Moderate Impairment    Change in Gait Speed  Moderate Impairment    Gait with Horizontal Head Turns  Moderate Impairment    Gait with Vertical Head Turns  Severe Impairment    Gait and Pivot Turn  Mild Impairment    Step Over Obstacle  Normal    Step Around Obstacles  Mild Impairment    Steps  Normal    Total Score  13            [facility] Adult PT Treatment/Exercise - 07/06/17 0001      Exercises   Exercises  Knee/Hip      Knee/Hip Exercises: Stretches  Hip Flexor Stretch  3 reps;Both;30 seconds    Hip Flexor Stretch Limitations  supine       Knee/Hip Exercises: Supine   Bridges  Both;2 sets;15 reps    Other Supine Knee/Hip Exercises  single leg clams with green TB x15 reps each LE          Balance Exercises - 07/06/17 1447      Balance Exercises: Standing   Standing Eyes Closed  Narrow base of support (BOS);Cognitive challenge;Solid surface;Other (comment) eyes closed     Tandem Stance  Eyes closed;2 reps;30 secs MinA, eyes closed         PT Education - 07/06/17 1447    Education provided  Yes    Education Details  results of DGI; HEP instructions with home aide    Person(s) Educated  Patient    Methods  Explanation    Comprehension  Verbalized understanding       PT Short Term Goals - 06/19/17 0954      PT SHORT TERM GOAL #1   Title  Therapist will complete DGI to further evaluate pt's dynamic balance and risk of activity and updated goals appropriately.     Time  2    Period  Weeks     Status  New    Target Date  07/03/17      PT SHORT TERM GOAL #2   Title  Pt will be demo consistency and independence with his HEP to improve strength and balance    Time  2    Period  Weeks    Status  New    Target Date  07/03/17      PT SHORT TERM GOAL #3   Title  Pt will complete 5x sit to stand in less than 14 sec without UE support, to reflect and improvement in his functional strength and power.     Time  4    Period  Weeks    Status  New    Target Date  07/17/17        PT Long Term Goals - 06/19/17 0959      PT LONG TERM GOAL #1   Title  Pt will demo improved BLE strength to atleast 5/5 MMT which will increase his safety and ability to complete daily tasks.     Time  8    Period  Weeks    Status  New    Target Date  08/14/17      PT LONG TERM GOAL #2   Title  Pt will maintain single leg balance on each LE for atleast 5 sec without LOB, 2/3 trials, to improve his safety with stair negotiation and other daily actiivty.     Time  8    Period  Weeks    Status  New      PT LONG TERM GOAL #3   Title  Pt will demo improved hamstring flexibility to lacking no more than 30 deg with 90/90 testing to improve his sitting posture.    Time  8    Period  Weeks    Status  New      PT LONG TERM GOAL #4   Title  Pt will complete 5x sit to stand in less than 14 sec without UE support, to reflect and improvement in his functional strength and power.     Time  8    Period  Weeks    Status  New      PT  LONG TERM GOAL #5   Title  Pt will score atleast 8 points higher on the Berg balance test which will place him at a lower risk of falling and causing injury with activity.     Time  8    Period  Weeks    Status  New            Plan - 07/06/17 1419    Clinical Impression Statement  Pt arrived today noting no significant change in his mobility/strength since his evaluation. Session focused on implementing his HEP to promote LE strength, with pt demonstrating good technique  and understanding. Also completed DGI to assess pt's dynamic balance, with a resulting score of 13 which places him in the high falls risk category. Therapist spoke with pt's caregiver end of session and both her and the pt verbalized understanding of HEP provided.    Rehab Potential  Good    PT Frequency  2x / week    PT Duration  8 weeks    PT Treatment/Interventions  ADLs/Self Care Home Management;Moist Heat;Cryotherapy;Balance training;Therapeutic exercise;Therapeutic activities;Gait training;Neuromuscular re-education;Stair training;Functional mobility training;Patient/family education;Manual techniques;Passive range of motion;Vestibular    PT Next Visit Plan  check orthostatic BP; hip strengthening, balance progressions and addition to of dual tasking as able     PT Home Exercise Plan  bridge, clam with green band, supine hip flexor stretch     Consulted and Agree with Plan of Care  Patient;Family member/caregiver    Family Member Consulted  pt's son        Patient will benefit from skilled therapeutic intervention in order to improve the following deficits and impairments:  Abnormal gait, Decreased activity tolerance, Decreased balance, Difficulty walking, Impaired flexibility, Hypomobility, Decreased strength, Decreased mobility, Postural dysfunction, Improper body mechanics  Visit Diagnosis: Unsteadiness on feet  Muscle weakness (generalized)  History of falling  Dizziness and giddiness     Problem List Patient Active Problem List   Diagnosis Date Noted  . Type 2 diabetes mellitus with complication ([place]) 78/24/2353  . CLL (chronic lymphocytic leukemia) ([place]) 09/01/2013    2:48 PM,07/06/17 Elly Modena PT, DPT [place] [facility] [address], [place], [place], [address] Phone: [phone]   Fax:  [phone]  Name: Hayden [name] MRN:  [id number] Date of Birth: 1941-02-23    PHYSICAL THERAPY DISCHARGE SUMMARY  Visits from Start of Care: 2  Current functional level related to goals / functional outcomes: See above for more details    Remaining deficits: See above for more details    Education / Equipment: See above for more details  Plan: Patient agrees to discharge.  Patient goals were not met. Patient is being discharged due to not returning since the last visit.  ?????    Pt has had several ED admissions over the past couple of month. He has not returned since his last visit on 07/06/17.   3:28 PM,09/07/17 Sherol Dade PT, [place] [facility] [place]

## 2017-07-06 NOTE — Patient Instructions (Addendum)
  BRIDGING  While lying on your back with knees bent, tighten your lower abdominals, squeeze your buttocks and then raise your buttocks off the floor/bed as creating a "Bridge" with your body. Hold and then lower yourself and repeat.  2x15 reps      Supine Clamshells/ Hip External Rotation  With TheraBand around BOTH knees, lie on your back with your feet together. Slowly turn knees AWAY from each other, stretching the TheraBand around knees. SLOWLY return knees back to start position.  Green band around the knees. x15 reps each leg     HIP FLEXOR STRETCH 2  While lying on a table or high bed, let the affected leg lower towards the floor until a stretch is felt along the front of your thigh.   At the same time, grasp your opposite knee and pull it towards your chest.  Hold 30 sec, repeat 3x each side.   [facility] [address] [place], [place] [address] Phone # [phone] Fax [phone]

## 2017-07-08 ENCOUNTER — Telehealth: Payer: Self-pay | Admitting: Physical Therapy

## 2017-07-08 ENCOUNTER — Ambulatory Visit: Payer: Medicare Other | Admitting: Physical Therapy

## 2017-07-13 DIAGNOSIS — R351 Nocturia: Secondary | ICD-10-CM | POA: Diagnosis not present

## 2017-07-13 DIAGNOSIS — N401 Enlarged prostate with lower urinary tract symptoms: Secondary | ICD-10-CM | POA: Diagnosis not present

## 2017-07-13 DIAGNOSIS — R350 Frequency of micturition: Secondary | ICD-10-CM | POA: Diagnosis not present

## 2017-07-14 ENCOUNTER — Ambulatory Visit: Payer: Medicare Other | Admitting: Physical Therapy

## 2017-07-14 ENCOUNTER — Telehealth: Payer: Self-pay | Admitting: Physical Therapy

## 2017-07-14 NOTE — Telephone Encounter (Signed)
No show. Called pt's son and left voicemail regarding Hayden [name] missed appointment. Provided him the time/date of pt's upcoming appointment and encouraged him to call with any questions/concerns.    [facility] [address], [place], [place] [address] Phone # [phone] Fax [phone]

## 2017-07-16 ENCOUNTER — Other Ambulatory Visit: Payer: Self-pay

## 2017-07-16 ENCOUNTER — Encounter: Payer: PRIVATE HEALTH INSURANCE | Admitting: Physical Therapy

## 2017-07-16 ENCOUNTER — Emergency Department (HOSPITAL_COMMUNITY)
Admission: EM | Admit: 2017-07-16 | Discharge: 2017-07-17 | Disposition: A | Discharge status: Home / Self Care | Payer: Medicare Other | Attending: Physician Assistant | Admitting: Physician Assistant

## 2017-07-16 DIAGNOSIS — F0390 Unspecified dementia without behavioral disturbance: Secondary | ICD-10-CM | POA: Diagnosis not present

## 2017-07-16 DIAGNOSIS — Z79899 Other long term (current) drug therapy: Secondary | ICD-10-CM | POA: Diagnosis not present

## 2017-07-16 DIAGNOSIS — Z794 Long term (current) use of insulin: Secondary | ICD-10-CM | POA: Insufficient documentation

## 2017-07-16 DIAGNOSIS — Z856 Personal history of leukemia: Secondary | ICD-10-CM | POA: Insufficient documentation

## 2017-07-16 DIAGNOSIS — E1165 Type 2 diabetes mellitus with hyperglycemia: Secondary | ICD-10-CM | POA: Insufficient documentation

## 2017-07-16 DIAGNOSIS — R339 Retention of urine, unspecified: Secondary | ICD-10-CM | POA: Insufficient documentation

## 2017-07-16 DIAGNOSIS — R350 Frequency of micturition: Secondary | ICD-10-CM | POA: Diagnosis present

## 2017-07-16 DIAGNOSIS — R739 Hyperglycemia, unspecified: Secondary | ICD-10-CM | POA: Diagnosis not present

## 2017-07-16 LAB — COMPREHENSIVE METABOLIC PANEL
ALK PHOS: 77 U/L (ref 38–126)
ALT: 26 U/L (ref 17–63)
ANION GAP: 8 (ref 5–15)
AST: 17 U/L (ref 15–41)
Albumin: 4.2 g/dL (ref 3.5–5.0)
BILIRUBIN TOTAL: 1.5 mg/dL — AB (ref 0.3–1.2)
BUN: 20 mg/dL (ref 6–20)
CALCIUM: 9.3 mg/dL (ref 8.9–10.3)
CO2: 26 mmol/L (ref 22–32)
CREATININE: 0.62 mg/dL (ref 0.61–1.24)
Chloride: 99 mmol/L — ABNORMAL LOW (ref 101–111)
GFR calc Af Amer: >60 mL/min (ref >60)
GFR calc non Af Amer: >60 mL/min (ref >60)
Glucose, Bld: 298 mg/dL — ABNORMAL HIGH (ref 65–99)
Potassium: 3.9 mmol/L (ref 3.5–5.1)
SODIUM: 133 mmol/L — AB (ref 135–145)
TOTAL PROTEIN: 6.6 g/dL (ref 6.5–8.1)

## 2017-07-16 LAB — URINALYSIS, ROUTINE W REFLEX MICROSCOPIC
BACTERIA UA: NONE SEEN
Bilirubin Urine: NEGATIVE
Ketones, ur: 5 mg/dL — AB
LEUKOCYTES UA: NEGATIVE
Nitrite: NEGATIVE
PH: 6 (ref 5.0–8.0)
Protein, ur: NEGATIVE mg/dL
SPECIFIC GRAVITY, URINE: 1.026 (ref 1.005–1.030)
SQUAMOUS EPITHELIAL / LPF: NONE SEEN

## 2017-07-16 LAB — CBC WITH DIFFERENTIAL/PLATELET
BLASTS: 0 %
Band Neutrophils: 0 %
Basophils Absolute: 0 10*3/uL (ref 0.0–0.1)
Basophils Relative: 0 %
Eosinophils Absolute: 0.1 10*3/uL (ref 0.0–0.7)
Eosinophils Relative: 1 %
HEMATOCRIT: 39.5 % (ref 39.0–52.0)
HEMOGLOBIN: 14 g/dL (ref 13.0–17.0)
LYMPHS PCT: 37 %
Lymphs Abs: 3.4 10*3/uL (ref 0.7–4.0)
MCH: 31.2 pg (ref 26.0–34.0)
MCHC: 35.4 g/dL (ref 30.0–36.0)
MCV: 88 fL (ref 78.0–100.0)
MYELOCYTES: 0 %
Metamyelocytes Relative: 0 %
Monocytes Absolute: 0.3 10*3/uL (ref 0.1–1.0)
Monocytes Relative: 3 %
NEUTROS PCT: 59 %
NRBC: 0 /100{WBCs}
Neutro Abs: 5.4 10*3/uL (ref 1.7–7.7)
OTHER: 0 %
PROMYELOCYTES ABS: 0 %
Platelets: 162 10*3/uL (ref 150–400)
RBC: 4.49 MIL/uL (ref 4.22–5.81)
RDW: 13.9 % (ref 11.5–15.5)
WBC: 9.2 10*3/uL (ref 4.0–10.5)

## 2017-07-16 NOTE — ED Notes (Signed)
Pt agitated and confused. Pt threatening staff with "swating" at them or hitting them. Also yelling and cussing at [employer] and [place]ill [place]berg, Therapist, sports. Pt wanting to "get up to walk to the kitchen table to get their valuables". Writer explained to the pt he was in the hospital and could not get up to go to the kitchen table to look for their valuables.

## 2017-07-16 NOTE — ED Triage Notes (Signed)
Per EMS, Pt from home with Hyperglycemia. Hx of dementia and DM. Doesn't seem to be taking his meds or insulin. Has home health aide a few times a week and she says his CBG is usually in the 400's. C/O frequent and painful urination.

## 2017-07-16 NOTE — ED Notes (Signed)
Pt getting very agitated and yelling at Probation officer. RN notified.

## 2017-07-16 NOTE — ED Notes (Signed)
Pt sitting on edge of bed asking about his blood pressure and trying to figure out how to urinate. Pt's blood pressure was told to patient and is within normal limits. Pt also asked if he would like to walk to bathroom but declined, as he had just urinated into the urinal at bedside. Will notify RN.

## 2017-07-16 NOTE — Progress Notes (Addendum)
Consult request has been received. CSW attempting to follow up at present time.  CSW met with EDP who states pt is medically cleared for D/C, but does still present with some confusion.  EDP is concerned due to pt being D/C'd home where he lives by himself.  CSW called pt's son Brooks Student for collateral and to inform him pt is at [facility] ED and ready for D/C. Pt's son is a EMT with [employer] and with [facility] [facility] [employer].   Pt's son stated pt has been confused for some time and has been at current baseline since Thanksgiving 2018 and confusion has been increased and increasing since then.  In the words of the pt's son pt's confusion has "ramped up" since Thanksgiving.  Pt states this is his father's current baseline.  Per pt's son, pt is seen by "[employer]" a company or entity that comes to the pt's home every day for 3-4 hours and assists the pt in the home and with going to the grocery store.   Per pt's son pt is in "gray area" where he is not quite ready for assistant living.  Per pt's son, he is going to "take him (the pt) to [place] to get a 24 hour work-up because I heard they are good with seniors.  Per pt's son he had taken his father's car keys and his father now "takes taxi's all over the place", but "due to his forgetfulness he (the pt) ends up forgetting about his appointments while riding around in taxis".  Per pt's son he has "taken over his appointments to make sure he gets to them because of his appointments.  Pt's son asked if pt had his keys so he could get back in his home.  Pt's son also stated he left his current shift as a EMT at 7am on 1/18 and would go to check on his father then. Pt's son also stated the pt had money in the home with which the pt could pay for a taxi. Pt's son stated that if needed his father could transport by taxi to the pt's son's wife's home to be cared for until morning if absolutely necessary.   CSW hung up the phone to check with the pt to see if  the pt had his wallet and keys.  Pt stated he had his key but not his wallet.  CSW spoke to pt's RN who stated she was concerned because the pt had a foley catheter that needed to stay in for 10 days and that the patient if sent home alone may pull out his catheter.   Pt's son asked why pt could be D/C'd if he was confused and CSW stated pt was in a "gray area" because the pt has not declared or stated that he w as SI/HI and/or AVH and thus would not qualify for a TTS consult and/or to be seen psychiatrically.  Pt's son stated to the CSW that this answer "was well-played, well-played, good answer, I see what you are doing".  CSW called the pt's son back to relay the concern about the pt's catheter being pulled out and pt's son asked if [facility] ED could keep the pt overnight.  CSW explained this is not possible because the pt is medically cleared for D/C. CSW asked if pt could be transported to the pt's son's wife's home and pt's son stated he would see and would call back.   Pt's son called back yelling at the social worker that   he was "clocking out of my shift as an EMT" and would be en route to the [facility] ED to pick up the pt and again yelling at this writer "there were three ambulances serving 25,000 people but now there will be two because your damn hospital is discharging a fucking confused person with a foley catheter. Thank you!"   Pt's son then hung up.  CSW updated EDP and RN pt's son stated he is en route.   F. , LCSW, LCAS, CSI Clinical Social Worker Ph: [phone]  

## 2017-07-16 NOTE — ED Provider Notes (Signed)
[facility] DEPT Provider Note   CSN: [id number] Arrival date & time: 07/16/17  1421     History   Chief Complaint Chief Complaint  Patient presents with  . Hyperglycemia    HPI Hayden [name] is a 77 y.o. male.  HPI   Patient is a 77 year old male presenting with urinary frequency.  Patient reports that he has no other symptoms.  No nausea no vomiting no fevers.  Eating and drinking normally.  Patient is reports he had to urinate and then a couple minutes later would have to go again.  He will go a little bit and then feel urged again to in the next little bit.  Patient reports he has primary care appointment and also an appointment with the endocrinologist.  Patient seems intermittently confused.   Past Medical History:  Diagnosis Date  . Depression   . Diabetes mellitus   . Fibromyalgia   . GERD (gastroesophageal reflux disease)    reflux intermittent  . Hyperlipidemia   . Leukemia [facility]) chronic lymphocytic   2008 diagnosed-monitored Dr     Patient Active Problem List   Diagnosis Date Noted  . Type 2 diabetes mellitus with complication ([place]) 76/19/5093  . CLL (chronic lymphocytic leukemia) ([place]) 09/01/2013    Past Surgical History:  Procedure Laterality Date  . ADENOIDECTOMY    . CARPAL TUNNEL RELEASE     rt hand  . CATARACT EXTRACTION, BILATERAL    . CERVICAL FUSION  1998  . CYSTOSCOPY  07/26/2012   Procedure: CYSTOSCOPY;  Surgeon: Ailene Rud, MD;  Location: [facility];  Service: Urology;  Laterality: N/A;  DIAGNOSTIC CYSTO PROSTATE ULTRASOUND      . PROSTATE BIOPSY  07/26/2012   Procedure: PROSTATE BIOPSY;  Surgeon: Ailene Rud, MD;  Location[facility];  Service: Urology;  Laterality: N/A;  . retinal micro aneurysms    . tonsil    . VASECTOMY    . VITRECTOMY         Home Medications    Prior to Admission medications   Medication Sig Start Date End Date Taking?  Authorizing Provider  aspirin 81 MG tablet Take 81 mg by mouth at bedtime.    Yes [provider]  buPROPion (WELLBUTRIN) 100 MG tablet Take 100 mg by mouth 2 (two) times daily.    Yes [provider]  Cholecalciferol (VITAMIN D3) 1000 UNITS CAPS Take 1 capsule by mouth at bedtime.   Yes [provider]  fish oil-omega-3 fatty acids 1000 MG capsule Take 1 g by mouth 2 (two) times daily.    Yes [provider]  insulin lispro protamine-insulin lispro (HUMALOG 75/25) (75-25) 100 UNIT/ML SUSP Inject 8 Units into the skin 2 (two) times daily with a meal. Before breakfast, before supper   Yes [provider]  LEVEMIR FLEXTOUCH 100 UNIT/ML Pen Inject 8 Units into the skin at bedtime. 07/16/15  Yes [provider]  lovastatin (MEVACOR) 20 MG tablet Take 20 mg by mouth at bedtime.   Yes [provider]  Multiple Vitamin (MULITIVITAMIN WITH MINERALS) TABS Take 1 tablet by mouth at bedtime.   Yes [provider]  naproxen sodium (ANAPROX) 220 MG tablet Take 220 mg by mouth 2 (two) times daily as needed (pain).    Yes [provider]  silodosin (RAPAFLO) 8 MG CAPS capsule Take 8 mg by mouth every evening.    Yes [provider]  temazepam (RESTORIL) 30 MG  capsule Take 30 mg by mouth at bedtime as needed for sleep.  05/04/17  Yes [provider]  traZODone (DESYREL) 50 MG tablet Take 50 mg by mouth at bedtime as needed for sleep.  06/18/17  Yes [provider]  VESICARE 5 MG tablet Take 5 mg by mouth daily. 04/08/17  Yes [provider]  vitamin B-12 (CYANOCOBALAMIN) 1000 MCG tablet Take 1,000 mcg by mouth daily.   Yes [provider]  zolpidem (AMBIEN) 5 MG tablet Take 5 mg by mouth at bedtime as needed for sleep.  07/03/17  Yes [provider]    Family History Family History  Problem Relation Age of Onset  . Diabetes Father   . Diabetes Sister   . Diabetes Brother      Social History Social History   Tobacco Use  . Smoking status: Never Smoker  Substance Use Topics  . Alcohol use: Yes  . Drug use: Not on file     Allergies   Bee venom; Glucophage [metformin]; and Shellfish allergy   Review of Systems Review of Systems  Constitutional: Negative for activity change.  Respiratory: Negative for shortness of breath.   Cardiovascular: Negative for chest pain.  Gastrointestinal: Negative for abdominal pain.  Genitourinary: Positive for frequency and urgency.     Physical Exam Updated Vital Signs BP 131/72   Pulse 91   Temp 98.3 F (36.8 C)   Resp 16   SpO2 100%   Physical Exam  Constitutional: He is oriented to person, place, and time. He appears well-nourished.  HENT:  Head: Normocephalic.  Eyes: Conjunctivae are normal.  Cardiovascular: Normal rate and regular rhythm.  No murmur heard. Pulmonary/Chest: Effort normal and breath sounds normal. No respiratory distress.  Abdominal: Soft. He exhibits no distension. There is no tenderness.  Genitourinary: Penis normal.  Neurological: He is oriented to person, place, and time.  Skin: Skin is warm and dry. He is not diaphoretic.  Psychiatric: He has a normal mood and affect. His behavior is normal.     ED Treatments / Results  Labs (all labs ordered are listed, but only abnormal results are displayed) Labs Reviewed  CBC WITH DIFFERENTIAL/PLATELET  COMPREHENSIVE METABOLIC PANEL  URINALYSIS, ROUTINE W REFLEX MICROSCOPIC    EKG  EKG Interpretation None       Radiology No results found.  Procedures Procedures (including critical care time)  Medications Ordered in ED Medications - No data to display   Initial Impression / Assessment and Plan / ED Course  I have reviewed the triage vital signs and the nursing notes.  Pertinent labs & imaging results that were available during my care of the patient were reviewed by me and considered in my medical decision making  (see chart for details).     Patient is a 77 year old male presenting with urinary frequency.  Patient reports that he has no other symptoms.  No nausea no vomiting no fevers.  Eating and drinking normally.  Patient is reports he had to urinate and then a couple minutes later would have to go again.  He will go a little bit and then feel urged again to in the next little bit.  Patient reports he has primary care appointment and also an appointment with the endocrinologist.  3:56 PM Patient adamant that he feels well except for urinary frequency.  Will test urine today.  Patient's vital signs normal physical exam is reassuring.  Urine negative. Bladder scan showed over 500 cc of fluid.  This  is likely symptoms that are related to enlarged prostate.  Will Put Foley in and give leg bag.  Nursing felt that patient was intermittently oriented, felt unsafe discharging him home without someone present.  This appears clinically consistent with dementia.  Social work consulted to help to find a family member to pick up patient.  Expressed concerns to family member that patient has dementia and may not be safe living alone.  Final Clinical Impressions(s) / ED Diagnoses   Final diagnoses:  None    ED Discharge Orders    None       Macarthur Critchley, MD 07/16/17 2341

## 2017-07-16 NOTE — ED Notes (Signed)
Patient discharged into the care of his son. Patient son very upset that he "had to leave my 24 hour EMT shift because you all are discharging a confused old man with a foley catheter that you all placed!" EDMD called to bedside to discuss discharge criteria and instructions with the patient's son.  Discharge instructions reviewed and provided to the patient and his son. Patient's son provided with leg bag and urinal, as well as provided with foley care instructions. Patient's son states he is an EMT and understands. Patient PIV removed and got dressed. Patient wheeled to the car by Sam NT.

## 2017-07-16 NOTE — ED Notes (Signed)
Patient walked to the bathroom with 1 assist and did well.

## 2017-07-16 NOTE — Discharge Instructions (Signed)
Please give his leg bag on for the next 10 days and follow-up with urology.

## 2017-07-19 ENCOUNTER — Other Ambulatory Visit: Payer: Self-pay

## 2017-07-19 ENCOUNTER — Emergency Department (HOSPITAL_COMMUNITY)
Admission: EM | Admit: 2017-07-19 | Discharge: 2017-07-19 | Disposition: A | Discharge status: Home / Self Care | Payer: Medicare Other | Attending: Emergency Medicine | Admitting: Emergency Medicine

## 2017-07-19 ENCOUNTER — Encounter (HOSPITAL_COMMUNITY): Payer: Self-pay | Admitting: Emergency Medicine

## 2017-07-19 DIAGNOSIS — E119 Type 2 diabetes mellitus without complications: Secondary | ICD-10-CM | POA: Diagnosis not present

## 2017-07-19 DIAGNOSIS — Y829 Unspecified medical devices associated with adverse incidents: Secondary | ICD-10-CM | POA: Diagnosis not present

## 2017-07-19 DIAGNOSIS — T839XXA Unspecified complication of genitourinary prosthetic device, implant and graft, initial encounter: Secondary | ICD-10-CM | POA: Diagnosis not present

## 2017-07-19 DIAGNOSIS — Z856 Personal history of leukemia: Secondary | ICD-10-CM | POA: Diagnosis not present

## 2017-07-19 DIAGNOSIS — T83091A Other mechanical complication of indwelling urethral catheter, initial encounter: Secondary | ICD-10-CM | POA: Diagnosis not present

## 2017-07-19 HISTORY — DX: Benign prostatic hyperplasia without lower urinary tract symptoms: N40.0

## 2017-07-19 NOTE — ED Provider Notes (Signed)
Emergency Department Provider Note   I have reviewed the triage vital signs and the nursing notes.   HISTORY  Chief Complaint Other (Foley Bag replacement)   HPI Hayden [name] is a 77 y.o. male he was seen a couple days ago for urinary retention had a Foley catheter placed with a collection bag.  They refused a leg bag at that time.  Last night he was trying to modify his catheter to make a less cumbersome and could not get back on and it was draining urine so he came here for evaluation.  He is in no pain he has no other symptoms.  No bleeding.  The catheter still been draining. No other associated or modifying symptoms.    Past Medical History:  Diagnosis Date  . Depression   . Diabetes mellitus   . Enlarged prostate   . Fibromyalgia   . GERD (gastroesophageal reflux disease)    reflux intermittent  . Hyperlipidemia   . Leukemia [facility]) chronic lymphocytic   2008 diagnosed-monitored Dr     Patient Active Problem List   Diagnosis Date Noted  . Type 2 diabetes mellitus with complication ([place]) 02/58/5277  . 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 ADENOIDECTOMY    . CARPAL TUNNEL RELEASE     rt hand  . CATARACT EXTRACTION, BILATERAL    . CERVICAL FUSION  1998  . CYSTOSCOPY  07/26/2012   Procedure: CYSTOSCOPY;  Surgeon: Ailene Rud, MD;  Location: [facility];  Service: Urology;  Laterality: N/A;  DIAGNOSTIC CYSTO PROSTATE ULTRASOUND      . PROSTATE BIOPSY  07/26/2012   Procedure: PROSTATE BIOPSY;  Surgeon: Ailene Rud, MD;  Location: [facility];  Service: Urology;  Laterality: N/A;  . retinal micro aneurysms    . tonsil    . VASECTOMY    . VITRECTOMY      Current Outpatient Rx  . Order #: [id number] Class: Historical Med  . Order #: [id number] Class: Historical Med  . Order #: [id number] Class: Historical Med  . Order #: [id number] Class: Historical Med   . Order #: [id number] Class: Historical Med  . Order #: [id number] Class: Historical Med  . Order #: [id number] Class: Historical Med  . Order #: [id number] Class: Historical Med  . Order #: [id number] Class: Historical Med  . Order #: [id number] Class: Historical Med  . Order #: [id number] Class: Historical Med  . Order #: [id number] Class: Historical Med  . Order #: [id number] Class: Historical Med  . Order #: [id number] Class: Historical Med  . Order #: [id number] Class: Historical Med    Allergies Bee venom; Glucophage [metformin]; and Shellfish allergy  Family History  Problem Relation Age of Onset  . Diabetes Father   . Diabetes Sister   . Diabetes Brother     Social History Social History   Tobacco Use  . Smoking status: Never Smoker  . Smokeless tobacco: Never Used  Substance Use Topics  . Alcohol use: No    Frequency: Never  . Drug use: No    Review of Systems  All other systems negative except as documented in the HPI. All pertinent positives and negatives as reviewed in the HPI. ____________________________________________   PHYSICAL EXAM:  VITAL SIGNS: ED Triage Vitals [07/19/17 1102]  Enc Vitals Group     BP (!) 113/59     Pulse Rate 77     Resp      Temp 98.4 F (36.9 C)  Temp Source Oral     SpO2 100 %     Weight 125 lb (56.7 kg)     Height 5\' 7"  (1.702 m)     Head Circumference      Peak Flow      Pain Score      Pain Loc      Pain Edu?      Excl. in [place]?     Constitutional: Alert and oriented. Well appearing and in no acute distress. Eyes: Conjunctivae are normal. PERRL. EOMI. Head: Atraumatic. Nose: No congestion/rhinnorhea. Mouth/Throat: Mucous membranes are moist.  Oropharynx non-erythematous. Neck: No stridor.  No meningeal signs.   Cardiovascular: Normal rate, regular rhythm. Good peripheral circulation. Grossly normal heart sounds.   Respiratory: Normal respiratory effort.  No retractions. Lungs CTAB. Gastrointestinal: Soft and nontender. No  distention.  Musculoskeletal: No lower extremity tenderness nor edema. No gross deformities of extremities. Neurologic:  Normal speech and language. No gross focal neurologic deficits are appreciated.  Skin:  Skin is warm, dry and intact. No rash noted.  ____________________________________________   INITIAL IMPRESSION / ASSESSMENT AND PLAN / ED COURSE  Replaced bag. Draining urine. Review of records that show no problems with UA.    Pertinent labs & imaging results that were available during my care of the patient were reviewed by me and considered in my medical decision making (see chart for details).  ____________________________________________  FINAL CLINICAL IMPRESSION(S) / ED DIAGNOSES  Final diagnoses:  Problem with Foley catheter, initial encounter ([place])     MEDICATIONS GIVEN DURING THIS VISIT:  Medications - No data to display   NEW OUTPATIENT MEDICATIONS STARTED DURING THIS VISIT:  Discharge Medication List as of 07/19/2017 11:18 AM      Note:  This note was prepared with assistance of Dragon voice recognition software. Occasional wrong-word or sound-a-like substitutions may have occurred due to the inherent limitations of voice recognition software.   Merrily Pew, MD 07/19/17 1328

## 2017-07-19 NOTE — ED Triage Notes (Signed)
Patient has foley that was placed Thursday at [facility], last night bag became unattached. Patient requesting it be replaced with leg bag. Foley tubing still intact. Denies any pain or bleeding.

## 2017-07-21 DIAGNOSIS — F411 Generalized anxiety disorder: Secondary | ICD-10-CM | POA: Diagnosis not present

## 2017-07-22 DIAGNOSIS — R3915 Urgency of urination: Secondary | ICD-10-CM | POA: Diagnosis not present

## 2017-07-22 DIAGNOSIS — R350 Frequency of micturition: Secondary | ICD-10-CM | POA: Diagnosis not present

## 2017-07-29 DIAGNOSIS — R3914 Feeling of incomplete bladder emptying: Secondary | ICD-10-CM | POA: Diagnosis not present

## 2017-07-29 DIAGNOSIS — N3281 Overactive bladder: Secondary | ICD-10-CM | POA: Diagnosis not present

## 2017-07-30 DIAGNOSIS — Z794 Long term (current) use of insulin: Secondary | ICD-10-CM | POA: Diagnosis not present

## 2017-07-30 DIAGNOSIS — E785 Hyperlipidemia, unspecified: Secondary | ICD-10-CM | POA: Diagnosis not present

## 2017-07-30 DIAGNOSIS — G4700 Insomnia, unspecified: Secondary | ICD-10-CM | POA: Diagnosis not present

## 2017-07-30 DIAGNOSIS — Z0000 Encounter for general adult medical examination without abnormal findings: Secondary | ICD-10-CM | POA: Diagnosis not present

## 2017-07-30 DIAGNOSIS — R410 Disorientation, unspecified: Secondary | ICD-10-CM | POA: Diagnosis not present

## 2017-07-30 DIAGNOSIS — E1142 Type 2 diabetes mellitus with diabetic polyneuropathy: Secondary | ICD-10-CM | POA: Diagnosis not present

## 2017-07-30 DIAGNOSIS — N400 Enlarged prostate without lower urinary tract symptoms: Secondary | ICD-10-CM | POA: Diagnosis not present

## 2017-07-30 DIAGNOSIS — F418 Other specified anxiety disorders: Secondary | ICD-10-CM | POA: Diagnosis not present

## 2017-08-05 DIAGNOSIS — N400 Enlarged prostate without lower urinary tract symptoms: Secondary | ICD-10-CM | POA: Diagnosis not present

## 2017-08-06 ENCOUNTER — Telehealth: Payer: Self-pay | Admitting: Internal Medicine

## 2017-08-06 NOTE — Telephone Encounter (Signed)
Scheduled appts per 2/6 sch msg - left voicemail for patient regarding appts.

## 2017-08-10 ENCOUNTER — Encounter: Payer: Self-pay | Admitting: Neurology

## 2017-08-12 ENCOUNTER — Encounter: Payer: Self-pay | Admitting: Internal Medicine

## 2017-08-12 ENCOUNTER — Inpatient Hospital Stay: Payer: Medicare Other | Attending: Internal Medicine | Admitting: Internal Medicine

## 2017-08-12 VITALS — BP 126/66 | HR 80 | Temp 98.0°F | Resp 18 | Ht 67.0 in | Wt 137.4 lb

## 2017-08-12 DIAGNOSIS — C9110 Chronic lymphocytic leukemia of B-cell type not having achieved remission: Secondary | ICD-10-CM

## 2017-08-12 DIAGNOSIS — E119 Type 2 diabetes mellitus without complications: Secondary | ICD-10-CM | POA: Diagnosis not present

## 2017-08-12 DIAGNOSIS — Z7982 Long term (current) use of aspirin: Secondary | ICD-10-CM | POA: Insufficient documentation

## 2017-08-12 DIAGNOSIS — Z794 Long term (current) use of insulin: Secondary | ICD-10-CM | POA: Diagnosis not present

## 2017-08-12 NOTE — Progress Notes (Signed)
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  PRIOR THERAPY: None  CURRENT THERAPY: Observation.  INTERVAL HISTORY: Hayden [name] 77 y.o. male returns to the clinic today for follow-up visit.  The patient was seen more than 3 years ago for his chronic lymphocytic leukemia.  He was lost to follow-up for the last few years.  He was seen recently at the emergency department for evaluation of fatigue.  He was advised to follow-up with me again regarding his chronic lymphocytic leukemia.  He is feeling fine today with no specific complaints except for a few pounds of weight loss.  He denied having any chest pain, shortness breath, cough or hemoptysis.  He denied having any nausea, vomiting, diarrhea or constipation.  He continues to have some issues with insomnia.   MEDICAL HISTORY: Past Medical History:  Diagnosis Date  . Depression   . Diabetes mellitus   . Enlarged prostate   . Fibromyalgia   . GERD (gastroesophageal reflux disease)    reflux intermittent  . Hyperlipidemia   . Leukemia ([place]) chronic lymphocytic   2008 diagnosed-monitored Dr     ALLERGIES:  is allergic to bee venom; glucophage [metformin]; and shellfish allergy.  MEDICATIONS:  Current Outpatient Medications  Medication Sig Dispense Refill  . aspirin 81 MG tablet Take 81 mg by mouth at bedtime.     Marland Kitchen buPROPion (WELLBUTRIN) 100 MG tablet Take 100 mg by mouth 2 (two) times daily.     . Cholecalciferol (VITAMIN D3) 1000 UNITS CAPS Take 1 capsule by mouth at bedtime.    . fish oil-omega-3 fatty acids 1000 MG capsule Take 1 g by mouth 2 (two) times daily.     . insulin lispro protamine-insulin lispro (HUMALOG 75/25) (75-25) 100 UNIT/ML SUSP Inject 8 Units into the skin 2 (two) times daily with a meal. Before  breakfast, before supper    . LEVEMIR FLEXTOUCH 100 UNIT/ML Pen Inject 8 Units into the skin at bedtime.  11  . lovastatin (MEVACOR) 20 MG tablet Take 20 mg by mouth at bedtime.    . Multiple Vitamin (MULITIVITAMIN WITH MINERALS) TABS Take 1 tablet by mouth at bedtime.    . naproxen sodium (ANAPROX) 220 MG tablet Take 220 mg by mouth 2 (two) times daily as needed (pain).     Marland Kitchen silodosin (RAPAFLO) 8 MG CAPS capsule Take 8 mg by mouth every evening.     . temazepam (RESTORIL) 30 MG capsule Take 30 mg by mouth at bedtime as needed for sleep.   0  . traZODone (DESYREL) 50 MG tablet Take 50 mg by mouth at bedtime as needed for sleep.   0  . VESICARE 5 MG tablet Take 5 mg by mouth daily.  11  . vitamin B-12 (CYANOCOBALAMIN) 1000 MCG tablet Take 1,000 mcg by mouth daily.    Marland Kitchen zolpidem (AMBIEN) 5 MG tablet Take 5 mg by mouth at bedtime as needed for sleep.      No current facility-administered medications for this visit.     SURGICAL HISTORY:  Past Surgical History:  Procedure Laterality Date  . ADENOIDECTOMY    . CARPAL TUNNEL RELEASE     rt hand  . CATARACT EXTRACTION, BILATERAL    . CERVICAL FUSION  1998  . CYSTOSCOPY  07/26/2012   Procedure: CYSTOSCOPY;  Surgeon: Ailene Rud, MD;  Location: [facility];  Service: Urology;  Laterality: N/A;  DIAGNOSTIC CYSTO PROSTATE ULTRASOUND      . PROSTATE BIOPSY  07/26/2012   Procedure: PROSTATE BIOPSY;  Surgeon: Ailene Rud, MD;  [facility];  Service: Urology;  Laterality: N/A;  . retinal micro aneurysms    . tonsil    . VASECTOMY    . VITRECTOMY      REVIEW OF SYSTEMS:  A comprehensive review of systems was negative except for: Constitutional: positive for fatigue Behavioral/Psych: positive for sleep disturbance   PHYSICAL EXAMINATION: General appearance: alert, cooperative, fatigued and no distress Head: Normocephalic, without obvious abnormality, atraumatic Neck: no  adenopathy Lymph nodes: Cervical, supraclavicular, and axillary nodes normal. Resp: clear to auscultation bilaterally Back: symmetric, no curvature. ROM normal. No CVA tenderness. Cardio: regular rate and rhythm, S1, S2 normal, no murmur, click, rub or gallop GI: soft, non-tender; bowel sounds normal; no masses,  no organomegaly Extremities: extremities normal, atraumatic, no cyanosis or edema  ECOG PERFORMANCE STATUS: 1 - Symptomatic but completely ambulatory  Blood pressure 126/66, pulse 80, temperature 98 F (36.7 C), temperature source Oral, resp. rate 18, height 5\' 7"  (1.702 m), weight 137 lb 6.4 oz (62.3 kg), SpO2 100 %.  LABORATORY DATA: Lab Results  Component Value Date   WBC 9.2 07/16/2017   HGB 14.0 07/16/2017   HCT 39.5 07/16/2017   MCV 88.0 07/16/2017   PLT 162 07/16/2017      Chemistry      Component Value Date/Time   NA 133 (L) 07/16/2017 1557   NA 139 03/01/2014 1338   K 3.9 07/16/2017 1557   K 4.4 03/01/2014 1338   CL 99 (L) 07/16/2017 1557   CO2 26 07/16/2017 1557   CO2 22 03/01/2014 1338   BUN 20 07/16/2017 1557   BUN 21.3 03/01/2014 1338   CREATININE 0.62 07/16/2017 1557   CREATININE 0.9 03/01/2014 1338      Component Value Date/Time   CALCIUM 9.3 07/16/2017 1557   CALCIUM 9.0 03/01/2014 1338   ALKPHOS 77 07/16/2017 1557   ALKPHOS 58 03/01/2014 1338   AST 17 07/16/2017 1557   AST 16 03/01/2014 1338   ALT 26 07/16/2017 1557   ALT 18 03/01/2014 1338   BILITOT 1.5 (H) 07/16/2017 1557   BILITOT 0.84 03/01/2014 1338       RADIOGRAPHIC STUDIES: No results found.  ASSESSMENT AND PLAN: This is a very pleasant 77 years old white male with stage 0 chronic lymphocytic leukemia.  The patient continues to do very well today with no concerning complaints except for insomnia and mild fatigue. Repeat CBC performed recently at the emergency department showed normal white blood count. I discussed the lab results with the patient and recommended for him to  continue on observation with his primary care physician at this point. I will be happy to see him in the future on as-needed basis if he has any concerning findings or significant increase in his total white blood count. He was advised to call if he has any concerning symptoms. All questions were answered. The patient knows to call the clinic with any problems, questions or concerns. We can certainly see the patient much sooner if necessary.  Disclaimer: This note was dictated with voice recognition software. Similar sounding words can inadvertently be transcribed and may be missed upon review.

## 2017-08-19 DIAGNOSIS — N400 Enlarged prostate without lower urinary tract symptoms: Secondary | ICD-10-CM | POA: Diagnosis not present

## 2017-08-20 ENCOUNTER — Emergency Department (HOSPITAL_COMMUNITY)
Admission: EM | Admit: 2017-08-20 | Discharge: 2017-08-20 | Disposition: A | Discharge status: Home / Self Care | Payer: Medicare Other | Attending: Emergency Medicine | Admitting: Emergency Medicine

## 2017-08-20 ENCOUNTER — Encounter (HOSPITAL_COMMUNITY): Payer: Self-pay | Admitting: Emergency Medicine

## 2017-08-20 DIAGNOSIS — Z794 Long term (current) use of insulin: Secondary | ICD-10-CM | POA: Insufficient documentation

## 2017-08-20 DIAGNOSIS — Z79899 Other long term (current) drug therapy: Secondary | ICD-10-CM | POA: Insufficient documentation

## 2017-08-20 DIAGNOSIS — E119 Type 2 diabetes mellitus without complications: Secondary | ICD-10-CM | POA: Diagnosis not present

## 2017-08-20 DIAGNOSIS — C9110 Chronic lymphocytic leukemia of B-cell type not having achieved remission: Secondary | ICD-10-CM | POA: Insufficient documentation

## 2017-08-20 DIAGNOSIS — Z7982 Long term (current) use of aspirin: Secondary | ICD-10-CM | POA: Insufficient documentation

## 2017-08-20 DIAGNOSIS — R339 Retention of urine, unspecified: Secondary | ICD-10-CM | POA: Insufficient documentation

## 2017-08-20 NOTE — ED Notes (Signed)
Please disregard previous note by this RN. Pt out of supplies for his foley - states that his home health nurse was to bring him supplies for a standard drainage bag for bed tonight but was unable to due to a funeral. Pt came to ED for supplies for overnight foley management. Provided 2 standard drainage bags, pt verbalized that he does not need instruction and knows how to use them. Previous foley in place and effective. Bladder scanned, no contents noted.   Pt assisted with cab ride home. Left at this time.

## 2017-08-20 NOTE — ED Triage Notes (Signed)
BIB EMS from home b/c home health nurse was unable to come to house tonight to drain his bladder.

## 2017-08-20 NOTE — ED Provider Notes (Signed)
[facility] EMERGENCY DEPARTMENT Provider Note   CSN: [id number] Arrival date & time: 08/20/17  0124     History   Chief Complaint Chief Complaint  Patient presents with  . Wants Bladder Drained    HPI Hayden [name] is a 77 y.o. male.  HPI Pt has a chronic indwelling foley catheter. Requesting a new 2 liter bag for night time and a new leg bag. No abdominal pain. Reports catheter is draining normally. No fever. No other complaints   Past Medical History:  Diagnosis Date  . Depression   . Diabetes mellitus   . Enlarged prostate   . Fibromyalgia   . GERD (gastroesophageal reflux disease)    reflux intermittent  . Hyperlipidemia   . Leukemia [facility]) chronic lymphocytic   2008 diagnosed-monitored Dr     Patient Active Problem List   Diagnosis Date Noted  . Type 2 diabetes mellitus with complication ([place]) 89/21/1942  . 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 ADENOIDECTOMY    . CARPAL TUNNEL RELEASE     rt hand  . CATARACT EXTRACTION, BILATERAL    . CERVICAL FUSION  1998  . CYSTOSCOPY  07/26/2012   Procedure: CYSTOSCOPY;  Surgeon: Ailene Rud, MD;  Location: [facility];  Service: Urology;  Laterality: N/A;  DIAGNOSTIC CYSTO PROSTATE ULTRASOUND      . PROSTATE BIOPSY  07/26/2012   Procedure: PROSTATE BIOPSY;  Surgeon: Ailene Rud, MD;  Location: [facility];  Service: Urology;  Laterality: N/A;  . retinal micro aneurysms    . tonsil    . VASECTOMY    . VITRECTOMY         Home Medications    Prior to Admission medications   Medication Sig Start Date End Date Taking? Authorizing Provider  aspirin 81 MG tablet Take 81 mg by mouth at bedtime.     [provider]  buPROPion (WELLBUTRIN) 100 MG tablet Take 100 mg by mouth 2 (two) times daily.     [provider]  Cholecalciferol (VITAMIN D3) 1000 UNITS CAPS  Take 1 capsule by mouth at bedtime.    [provider]  fish oil-omega-3 fatty acids 1000 MG capsule Take 1 g by mouth 2 (two) times daily.     [provider]  insulin lispro protamine-insulin lispro (HUMALOG 75/25) (75-25) 100 UNIT/ML SUSP Inject 8 Units into the skin 2 (two) times daily with a meal. Before breakfast, before supper    [provider]  LEVEMIR FLEXTOUCH 100 UNIT/ML Pen Inject 8 Units into the skin at bedtime. 07/16/15   [provider]  lovastatin (MEVACOR) 20 MG tablet Take 20 mg by mouth at bedtime.    [provider]  Multiple Vitamin (MULITIVITAMIN WITH MINERALS) TABS Take 1 tablet by mouth at bedtime.    [provider]  naproxen sodium (ANAPROX) 220 MG tablet Take 220 mg by mouth 2 (two) times daily as needed (pain).     [provider]  silodosin (RAPAFLO) 8 MG CAPS capsule Take 8 mg by mouth every evening.     [provider]  temazepam (RESTORIL) 30 MG capsule Take 30 mg by mouth at bedtime as needed for sleep.  05/04/17   [provider]  traZODone (DESYREL) 50 MG tablet Take 50 mg by mouth at bedtime as needed for sleep.  06/18/17   [provider]  VESICARE 5 MG  tablet Take 5 mg by mouth daily. 04/08/17   [provider]  vitamin B-12 (CYANOCOBALAMIN) 1000 MCG tablet Take 1,000 mcg by mouth daily.    [provider]  zolpidem (AMBIEN) 5 MG tablet Take 5 mg by mouth at bedtime as needed for sleep.  07/03/17   [provider]  calcium citrate-vitamin D (CITRACAL+D) 315-200 MG-UNIT per tablet Take 1 tablet by mouth daily.  09/04/11  [provider]  cetirizine (ZYRTEC) 10 MG tablet Take 10 mg by mouth daily.  09/04/11  [provider]  insulin lispro (HUMALOG) 100 UNIT/ML injection Inject 100 Units into the skin daily as needed.  09/04/11  [provider]  terazosin (HYTRIN) 5 MG capsule Take 5 mg by mouth at bedtime.  09/04/11  [provider]    Family History Family History  Problem Relation Age of Onset  . Diabetes Father   . Diabetes Sister   . Diabetes Brother     Social History Social History   Tobacco Use  . Smoking status: Never Smoker  . Smokeless tobacco: Never Used  Substance Use Topics  . Alcohol use: No    Frequency: Never  . Drug use: No     Allergies   Bee venom; Glucophage [metformin]; and Shellfish allergy   Review of Systems Review of Systems  All other systems reviewed and are negative.    Physical Exam Updated Vital Signs BP (!) 154/77 (BP Location: Right Arm)   Pulse 80   Temp 98.8 F (37.1 C) (Oral)   Resp 16   Ht 5\' 8"  (1.727 m)   Wt 59 kg (130 lb)   SpO2 99%   BMI 19.77 kg/m   Physical Exam  Constitutional: He is oriented to person, place, and time. He appears well-developed and well-nourished.  HENT:  Head: Normocephalic.  Eyes: EOM are normal.  Neck: Normal range of motion.  Pulmonary/Chest: Effort normal.  Abdominal: He exhibits no distension.  Genitourinary:  Genitourinary Comments: Foley catheter in bag with clear urine in the leg bag  Musculoskeletal: Normal range of motion.  Neurological: He is alert and oriented to person, place, and time.  Psychiatric: He has a normal mood and affect.  Nursing note and vitals reviewed.    ED Treatments / Results  Labs (all labs ordered are listed, but only abnormal results are displayed) Labs Reviewed - No data to display  EKG  EKG Interpretation None       Radiology No results found.  Procedures Procedures (including critical care time)  Medications Ordered in ED Medications - No data to display   Initial Impression / Assessment and Plan / ED Course  I have reviewed the triage vital signs and the nursing notes.  Pertinent labs & imaging results that were available during my care of the patient were reviewed by me and considered in my medical decision making (see chart for details).       Needs medical supplies. Provided. No complaints  Final Clinical Impressions(s) / ED Diagnoses   Final diagnoses:  Urinary retention    ED Discharge Orders    None       Jola Schmidt, MD 08/20/17 [phone]

## 2017-08-21 DIAGNOSIS — R7309 Other abnormal glucose: Secondary | ICD-10-CM | POA: Diagnosis not present

## 2017-08-21 DIAGNOSIS — E1165 Type 2 diabetes mellitus with hyperglycemia: Secondary | ICD-10-CM | POA: Diagnosis not present

## 2017-08-27 DIAGNOSIS — E1351 Other specified diabetes mellitus with diabetic peripheral angiopathy without gangrene: Secondary | ICD-10-CM | POA: Diagnosis not present

## 2017-08-27 DIAGNOSIS — M21961 Unspecified acquired deformity of right lower leg: Secondary | ICD-10-CM | POA: Diagnosis not present

## 2017-08-27 DIAGNOSIS — L602 Onychogryphosis: Secondary | ICD-10-CM | POA: Diagnosis not present

## 2017-08-27 DIAGNOSIS — M21962 Unspecified acquired deformity of left lower leg: Secondary | ICD-10-CM | POA: Diagnosis not present

## 2017-08-28 DIAGNOSIS — R3914 Feeling of incomplete bladder emptying: Secondary | ICD-10-CM | POA: Diagnosis not present

## 2017-08-28 DIAGNOSIS — R350 Frequency of micturition: Secondary | ICD-10-CM | POA: Diagnosis not present

## 2017-08-29 ENCOUNTER — Emergency Department (HOSPITAL_COMMUNITY)
Admission: EM | Admit: 2017-08-29 | Discharge: 2017-08-30 | Disposition: A | Discharge status: Home / Self Care | Payer: Medicare Other | Attending: Emergency Medicine | Admitting: Emergency Medicine

## 2017-08-29 ENCOUNTER — Encounter (HOSPITAL_COMMUNITY): Payer: Self-pay | Admitting: Emergency Medicine

## 2017-08-29 DIAGNOSIS — Z711 Person with feared health complaint in whom no diagnosis is made: Secondary | ICD-10-CM | POA: Insufficient documentation

## 2017-08-29 DIAGNOSIS — Z79899 Other long term (current) drug therapy: Secondary | ICD-10-CM | POA: Insufficient documentation

## 2017-08-29 DIAGNOSIS — Z7982 Long term (current) use of aspirin: Secondary | ICD-10-CM | POA: Insufficient documentation

## 2017-08-29 DIAGNOSIS — E1165 Type 2 diabetes mellitus with hyperglycemia: Secondary | ICD-10-CM | POA: Insufficient documentation

## 2017-08-29 DIAGNOSIS — Z794 Long term (current) use of insulin: Secondary | ICD-10-CM | POA: Insufficient documentation

## 2017-08-29 DIAGNOSIS — R410 Disorientation, unspecified: Secondary | ICD-10-CM | POA: Diagnosis present

## 2017-08-29 DIAGNOSIS — R739 Hyperglycemia, unspecified: Secondary | ICD-10-CM

## 2017-08-29 LAB — CBC
HEMATOCRIT: 38.9 % — AB (ref 39.0–52.0)
HEMOGLOBIN: 13.2 g/dL (ref 13.0–17.0)
MCH: 30.4 pg (ref 26.0–34.0)
MCHC: 33.9 g/dL (ref 30.0–36.0)
MCV: 89.6 fL (ref 78.0–100.0)
Platelets: 190 10*3/uL (ref 150–400)
RBC: 4.34 MIL/uL (ref 4.22–5.81)
RDW: 13.9 % (ref 11.5–15.5)
WBC: 10.4 10*3/uL (ref 4.0–10.5)

## 2017-08-29 LAB — COMPREHENSIVE METABOLIC PANEL
ALBUMIN: 4.1 g/dL (ref 3.5–5.0)
ALT: 27 U/L (ref 17–63)
AST: 20 U/L (ref 15–41)
Alkaline Phosphatase: 73 U/L (ref 38–126)
Anion gap: 8 (ref 5–15)
BILIRUBIN TOTAL: 1.1 mg/dL (ref 0.3–1.2)
BUN: 32 mg/dL — ABNORMAL HIGH (ref 6–20)
CO2: 27 mmol/L (ref 22–32)
Calcium: 9.2 mg/dL (ref 8.9–10.3)
Chloride: 99 mmol/L — ABNORMAL LOW (ref 101–111)
Creatinine, Ser: 0.71 mg/dL (ref 0.61–1.24)
GFR calc Af Amer: >60 mL/min (ref >60)
GFR calc non Af Amer: >60 mL/min (ref >60)
GLUCOSE: 272 mg/dL — AB (ref 65–99)
Potassium: 3.8 mmol/L (ref 3.5–5.1)
SODIUM: 134 mmol/L — AB (ref 135–145)
TOTAL PROTEIN: 6.8 g/dL (ref 6.5–8.1)

## 2017-08-29 LAB — CBG MONITORING, ED: Glucose-Capillary: 249 mg/dL — ABNORMAL HIGH (ref 65–99)

## 2017-08-29 NOTE — Discharge Instructions (Signed)
Your lab work today was very reassuring.  However, it did show that you are mildly dehydrated.  I would like you to drink 6-8 glasses of water daily for the next week.  Your urine should be clear, which is a sign of hydration.  I would recommend following up with a doctor next week for repeat lab work.  For your diabetes, hydration will help lower your sugars.  However, he should call your primary care physician to discuss adjustment of your insulin regimen.  You may need to increase your insulin by 1-2 units.  This is likely causing your thirst as well as uneasy sensation.

## 2017-08-29 NOTE — ED Provider Notes (Signed)
[facility] DEPT Provider Note   CSN: [id number] Arrival date & time: 08/29/17  1903     History   Chief Complaint Chief Complaint  Patient presents with  . Altered Mental Status    HPI Hayden [name] is a 77 y.o. male.  77 year old male with history of diabetes, fibromyalgia, depression, dyslipidemia presents to the emergency department via [employer].  Patient states that he called the police secondary to vague feelings of uneasiness.  He explains that he needs a good night's rest and is seemingly fixated on having a drape placed in his room to block out all of the light.  He has no complaints of pain.  He specifically denies chest pain, shortness of breath, cough, congestion, nausea, vomiting, diarrhea.  He does report ongoing issues with short-term memory retention.  He foresees that he will eventually require movement to a nursing facility.  He currently lives in a home.  No additional complaints for this visit.   The history is provided by the patient. No language interpreter was used.  Altered Mental Status      Past Medical History:  Diagnosis Date  . Depression   . Diabetes mellitus   . Enlarged prostate   . Fibromyalgia   . GERD (gastroesophageal reflux disease)    reflux intermittent  . Hyperlipidemia   . Leukemia [facility]) chronic lymphocytic   2008 diagnosed-monitored Dr     Patient Active Problem List   Diagnosis Date Noted  . Type 2 diabetes mellitus with complication ([place]) 35/57/3220  . 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 ADENOIDECTOMY    . CARPAL TUNNEL RELEASE     rt hand  . CATARACT EXTRACTION, BILATERAL    . CERVICAL FUSION  1998  . CYSTOSCOPY  07/26/2012   Procedure: CYSTOSCOPY;  Surgeon: Ailene Rud, MD;  Location: [facility];  Service: Urology;  Laterality: N/A;  DIAGNOSTIC CYSTO PROSTATE ULTRASOUND      . PROSTATE BIOPSY   07/26/2012   Procedure: PROSTATE BIOPSY;  Surgeon: Ailene Rud, MD;  [facility];  Service: Urology;  Laterality: N/A;  . retinal micro aneurysms    . tonsil    . VASECTOMY    . VITRECTOMY         Home Medications    Prior to Admission medications   Medication Sig Start Date End Date Taking? Authorizing Provider  aspirin 81 MG tablet Take 81 mg by mouth at bedtime.     [provider]  buPROPion (WELLBUTRIN) 100 MG tablet Take 100 mg by mouth 2 (two) times daily.     [provider]  Cholecalciferol (VITAMIN D3) 1000 UNITS CAPS Take 1 capsule by mouth at bedtime.    [provider]  fish oil-omega-3 fatty acids 1000 MG capsule Take 1 g by mouth 2 (two) times daily.     [provider]  insulin lispro protamine-insulin lispro (HUMALOG 75/25) (75-25) 100 UNIT/ML SUSP Inject 8 Units into the skin 2 (two) times daily with a meal. Before breakfast, before supper    [provider]  LEVEMIR FLEXTOUCH 100 UNIT/ML Pen Inject 8 Units into the skin at bedtime. 07/16/15   [provider]  lovastatin (MEVACOR) 20 MG tablet Take 20 mg by mouth at bedtime.    [provider]  Multiple Vitamin (MULITIVITAMIN WITH MINERALS) TABS Take 1 tablet by mouth at bedtime.  [provider]  naproxen sodium (ANAPROX) 220 MG tablet Take 220 mg by mouth 2 (two) times daily as needed (pain).     [provider]  silodosin (RAPAFLO) 8 MG CAPS capsule Take 8 mg by mouth every evening.     [provider]  temazepam (RESTORIL) 30 MG capsule Take 30 mg by mouth at bedtime as needed for sleep.  05/04/17   [provider]  traZODone (DESYREL) 50 MG tablet Take 50 mg by mouth at bedtime as needed for sleep.  06/18/17   [provider]  VESICARE 5 MG tablet Take 5 mg by mouth daily. 04/08/17   [provider]  vitamin B-12 (CYANOCOBALAMIN) 1000 MCG tablet Take 1,000 mcg by mouth  daily.    [provider]  zolpidem (AMBIEN) 5 MG tablet Take 5 mg by mouth at bedtime as needed for sleep.  07/03/17   [provider]  calcium citrate-vitamin D (CITRACAL+D) 315-200 MG-UNIT per tablet Take 1 tablet by mouth daily.  09/04/11  [provider]  cetirizine (ZYRTEC) 10 MG tablet Take 10 mg by mouth daily.  09/04/11  [provider]  insulin lispro (HUMALOG) 100 UNIT/ML injection Inject 100 Units into the skin daily as needed.  09/04/11  [provider]  terazosin (HYTRIN) 5 MG capsule Take 5 mg by mouth at bedtime.  09/04/11  [provider]    Family History Family History  Problem Relation Age of Onset  . Diabetes Father   . Diabetes Sister   . Diabetes Brother     Social History Social History   Tobacco Use  . Smoking status: Never Smoker  . Smokeless tobacco: Never Used  Substance Use Topics  . Alcohol use: No    Frequency: Never  . Drug use: No     Allergies   Bee venom; Glucophage [metformin]; and Shellfish allergy   Review of Systems Review of Systems Ten systems reviewed and are negative for acute change, except as noted in the HPI.    Physical Exam Updated Vital Signs BP 114/72 (BP Location: Left Arm)   Pulse 80   Temp 97.7 F (36.5 C) (Oral)   Resp 16   Ht 5' 8.5" (1.74 m)   Wt 66.5 kg (146 lb 11.2 oz)   SpO2 97%   BMI 21.98 kg/m   Physical Exam  Constitutional: He is oriented to person, place, and time. He appears well-developed and well-nourished. No distress.  Nontoxic appearing and in NAD  HENT:  Head: Normocephalic and atraumatic.  Eyes: Conjunctivae and EOM are normal. No scleral icterus.  Neck: Normal range of motion.  Pulmonary/Chest: Effort normal. No respiratory distress.  Respirations even and unlabored  Abdominal: Soft. He exhibits no distension. There is no tenderness. There is no guarding.  Soft, nontender  Musculoskeletal: Normal range of motion.  Neurological: He is alert  and oriented to person, place, and time. He exhibits normal muscle tone. Coordination normal.  GCS 15. Speech is clear, articulate. Patient moving all extremities spontaneously.  Skin: Skin is warm and dry. No rash noted. He is not diaphoretic. No erythema. No pallor.  Psychiatric: He has a normal mood and affect. His behavior is normal.  Nursing note and vitals reviewed.    ED Treatments / Results  Labs (all labs ordered are listed, but only abnormal results are displayed) Labs Reviewed  COMPREHENSIVE METABOLIC PANEL - Abnormal; Notable for the following components:      Result Value   Sodium 134 (*)  Chloride 99 (*)    Glucose, Bld 272 (*)    BUN 32 (*)    All other components within normal limits  CBC - Abnormal; Notable for the following components:   HCT 38.9 (*)    All other components within normal limits  CBG MONITORING, ED - Abnormal; Notable for the following components:   Glucose-Capillary 249 (*)    All other components within normal limits    EKG  EKG Interpretation None       Radiology No results found.  Procedures Procedures (including critical care time)  Medications Ordered in ED Medications - No data to display   Initial Impression / Assessment and Plan / ED Course  I have reviewed the triage vital signs and the nursing notes.  Pertinent labs & imaging results that were available during my care of the patient were reviewed by me and considered in my medical decision making (see chart for details).     77 year old male presents to the emergency department for vague feeling of uneasiness.  He mostly complains of the need for his room to be dark so that he may sleep.  He has no specific complaints and denies pain.  His laboratory workup is consistent with baseline with mild hyperglycemia; no evidence of DKA.  Patient afebrile with reassuring vital signs.  The patient does not appear to be a harm to himself or others.  I do not see indication for  further emergent workup at this time.  He does live alone and would likely benefit from face-to-face evaluation.  This has been ordered.  Return precautions discussed and provided. Patient discharged in stable condition with no unaddressed concerns.   Final Clinical Impressions(s) / ED Diagnoses   Final diagnoses:  Worried well  Hyperglycemia    ED Discharge Orders    None       Antonietta Breach, PA-C 08/30/17 Sharl Ma    Duffy Bruce, MD 08/30/17 [phone]

## 2017-08-29 NOTE — ED Notes (Addendum)
Pt felt like something was off tonight so he called [employer] to come pick him up and take him up to the hospital. He couldn't quite describe exactly what exactly he was feeling. He described it as an "amorphous uneasy feeling."

## 2017-08-29 NOTE — ED Notes (Signed)
Pt refusing vital signs

## 2017-08-29 NOTE — ED Notes (Signed)
Pt refuses any further treatment until curtains from room are removed. Per patient, "they are unacceptable and should not be in the room". MD and charge RN are were notified.

## 2017-08-29 NOTE — ED Notes (Signed)
Pt belongings have been returned back to pt that were held by security. Pt belongings that were secured in room have been returned back to the pt.

## 2017-08-29 NOTE — ED Triage Notes (Signed)
Patient reports "I am in a confused state of mind with not very much short term memory." Patient denies N/V/D, chest pain, SOB, cough, congestion, and urinary sx. Reports " my wife is in memory care and I think I am headed that way soon." A&Ox3.

## 2017-08-30 NOTE — ED Notes (Signed)
[employer] called for transportation back to home

## 2017-08-31 ENCOUNTER — Encounter (HOSPITAL_COMMUNITY): Payer: Self-pay | Admitting: Emergency Medicine

## 2017-08-31 ENCOUNTER — Emergency Department (HOSPITAL_COMMUNITY)
Admission: EM | Admit: 2017-08-31 | Discharge: 2017-08-31 | Disposition: A | Discharge status: Home / Self Care | Payer: Medicare Other | Attending: Emergency Medicine | Admitting: Emergency Medicine

## 2017-08-31 DIAGNOSIS — H2000 Unspecified acute and subacute iridocyclitis: Secondary | ICD-10-CM | POA: Diagnosis not present

## 2017-08-31 DIAGNOSIS — T83098A Other mechanical complication of other indwelling urethral catheter, initial encounter: Secondary | ICD-10-CM | POA: Insufficient documentation

## 2017-08-31 DIAGNOSIS — N3000 Acute cystitis without hematuria: Secondary | ICD-10-CM | POA: Diagnosis not present

## 2017-08-31 DIAGNOSIS — Z794 Long term (current) use of insulin: Secondary | ICD-10-CM | POA: Diagnosis not present

## 2017-08-31 DIAGNOSIS — Y658 Other specified misadventures during surgical and medical care: Secondary | ICD-10-CM | POA: Diagnosis not present

## 2017-08-31 DIAGNOSIS — N401 Enlarged prostate with lower urinary tract symptoms: Secondary | ICD-10-CM | POA: Diagnosis not present

## 2017-08-31 DIAGNOSIS — Z7982 Long term (current) use of aspirin: Secondary | ICD-10-CM | POA: Insufficient documentation

## 2017-08-31 DIAGNOSIS — E1165 Type 2 diabetes mellitus with hyperglycemia: Secondary | ICD-10-CM | POA: Diagnosis not present

## 2017-08-31 DIAGNOSIS — R739 Hyperglycemia, unspecified: Secondary | ICD-10-CM | POA: Diagnosis not present

## 2017-08-31 DIAGNOSIS — Z79899 Other long term (current) drug therapy: Secondary | ICD-10-CM | POA: Insufficient documentation

## 2017-08-31 DIAGNOSIS — E119 Type 2 diabetes mellitus without complications: Secondary | ICD-10-CM | POA: Diagnosis not present

## 2017-08-31 DIAGNOSIS — R197 Diarrhea, unspecified: Secondary | ICD-10-CM | POA: Diagnosis present

## 2017-08-31 LAB — URINALYSIS, ROUTINE W REFLEX MICROSCOPIC
Bilirubin Urine: NEGATIVE
KETONES UR: 5 mg/dL — AB
Nitrite: POSITIVE — AB
PH: 6 (ref 5.0–8.0)
Protein, ur: NEGATIVE mg/dL
SPECIFIC GRAVITY, URINE: 1.02 (ref 1.005–1.030)

## 2017-08-31 LAB — CBC WITH DIFFERENTIAL/PLATELET
Basophils Absolute: 0 10*3/uL (ref 0.0–0.1)
Basophils Relative: 0 %
Eosinophils Absolute: 0.2 10*3/uL (ref 0.0–0.7)
Eosinophils Relative: 2 %
HEMATOCRIT: 38.2 % — AB (ref 39.0–52.0)
HEMOGLOBIN: 12.7 g/dL — AB (ref 13.0–17.0)
LYMPHS ABS: 5.4 10*3/uL — AB (ref 0.7–4.0)
Lymphocytes Relative: 55 %
MCH: 30.6 pg (ref 26.0–34.0)
MCHC: 33.2 g/dL (ref 30.0–36.0)
MCV: 92 fL (ref 78.0–100.0)
MONOS PCT: 4 %
Monocytes Absolute: 0.4 10*3/uL (ref 0.1–1.0)
NEUTROS ABS: 3.8 10*3/uL (ref 1.7–7.7)
NEUTROS PCT: 39 %
Platelets: 184 10*3/uL (ref 150–400)
RBC: 4.15 MIL/uL — ABNORMAL LOW (ref 4.22–5.81)
RDW: 14.2 % (ref 11.5–15.5)
WBC: 9.7 10*3/uL (ref 4.0–10.5)

## 2017-08-31 LAB — BASIC METABOLIC PANEL
ANION GAP: 7 (ref 5–15)
BUN: 32 mg/dL — ABNORMAL HIGH (ref 6–20)
CHLORIDE: 104 mmol/L (ref 101–111)
CO2: 24 mmol/L (ref 22–32)
Calcium: 9.3 mg/dL (ref 8.9–10.3)
Creatinine, Ser: 0.66 mg/dL (ref 0.61–1.24)
GFR calc non Af Amer: >60 mL/min (ref >60)
Glucose, Bld: 302 mg/dL — ABNORMAL HIGH (ref 65–99)
POTASSIUM: 4 mmol/L (ref 3.5–5.1)
Sodium: 135 mmol/L (ref 135–145)

## 2017-08-31 LAB — CBG MONITORING, ED: Glucose-Capillary: 304 mg/dL — ABNORMAL HIGH (ref 65–99)

## 2017-08-31 MED ORDER — STERILE WATER FOR INJECTION IJ SOLN
INTRAMUSCULAR | Status: AC
  Filled 2017-08-31: qty 10

## 2017-08-31 MED ORDER — CEPHALEXIN 500 MG PO CAPS: 500.0000 mg | ORAL_CAPSULE | Freq: Four times a day (QID) | ORAL | 0 refills | Status: DC

## 2017-08-31 MED ORDER — SODIUM CHLORIDE 0.9 % IV SOLN: 1.0000 g | Freq: Once | INTRAVENOUS | Status: DC

## 2017-08-31 MED ORDER — CEFTRIAXONE SODIUM 250 MG IJ SOLR
250.0000 mg | Freq: Once | INTRAMUSCULAR | Status: AC
  Administered 2017-08-31: 250 mg via INTRAMUSCULAR
  Filled 2017-08-31: qty 250

## 2017-08-31 NOTE — ED Provider Notes (Signed)
Patient presented to the ER with complaints of elevated blood sugar.  He also reports that his Foley catheter bag is too small, feels up to fast.  No urinary symptoms.  Face to face Exam: HEENT - PERRLA Lungs - CTAB Heart - RRR, no M/R/G Abd - S/NT/ND Neuro - alert, oriented x3  Plan: Recheck labs to ensure that he is not in DKA although he does not appear to be.  Will address his Foley catheter concerns.  Likely plan for discharge.   Orpah Greek, MD 08/31/17 [phone]

## 2017-08-31 NOTE — ED Notes (Signed)
Bed: QK86 Expected date:  Expected time:  Means of arrival:  Comments: 77 yo m/Hyperglycemia

## 2017-08-31 NOTE — ED Triage Notes (Signed)
Patient here from home via EMS with complaints of hyperglycemia. Also reports that he feels as if his catheter is "filling up to fast" at night. Would like a bigger Foley catheter bag.

## 2017-08-31 NOTE — Discharge Instructions (Signed)
Take antibiotics as directed. Please take all of your antibiotics until finished.  As we discussed, you need to follow-up with [facility] for evaluation of her Foley catheter.  Take your diabetes medications as directed.  Return the emergency department for any fever, abdominal pain, chest pain, difficulty breathing, blood in your urine, pain with urination or any other worsening or concerning symptoms.

## 2017-08-31 NOTE — ED Provider Notes (Signed)
[place] DEPT Provider Note   CSN: [id number] Arrival date & time: 08/31/17  0556     History   Chief Complaint Chief Complaint  Patient presents with  . Hyperglycemia  . Foley Catheter Issues    HPI Hayden [name] is a 77 y.o. male past medical history of depression, DM, Fibromyalgia, GERD, HLD who presents for evaluation of Foley catheter issue and hyperglycemia.  Patient reports that he called EMS this morning because "he did not take advantage of an opportunity to get his catheter checked out."  Patient states that he has had an indwelling Foley catheter secondary to enlarged prostate and states that his leg bag is not big enough.  He states that he needs a bigger leg bag.  Patient states that he is not having any discomfort regarding the Foley catheter and denies any blood in the urine, difficulty urinating, pain with urination.  Patient also reports that over the last week, he has had looser stools and states that he will be passing flatulence and then realize he is having bowel movement. He denies any blood in stool.  Patient reports he is compliant with his insulin.  Patient denies any fevers, chest pain, difficulty breathing, abdominal pain, dysuria, hematuria, saddle anesthesia, history of back surgeries. Patient denies any SI/HI.   The history is provided by the patient.    Past Medical History:  Diagnosis Date  . Depression   . Diabetes mellitus   . Enlarged prostate   . Fibromyalgia   . GERD (gastroesophageal reflux disease)    reflux intermittent  . Hyperlipidemia   . Leukemia [facility]) chronic lymphocytic   2008 diagnosed-monitored Dr     Patient Active Problem List   Diagnosis Date Noted  . Type 2 diabetes mellitus with complication ([place]) 29/51/8841  . 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 ADENOIDECTOMY    . CARPAL TUNNEL RELEASE     rt hand  . CATARACT  EXTRACTION, BILATERAL    . CERVICAL FUSION  1998  . CYSTOSCOPY  07/26/2012   Procedure: CYSTOSCOPY;  Surgeon: Ailene Rud, MD;  Location: [facility];  Service: Urology;  Laterality: N/A;  DIAGNOSTIC CYSTO PROSTATE ULTRASOUND      . PROSTATE BIOPSY  07/26/2012   Procedure: PROSTATE BIOPSY;  Surgeon: Ailene Rud, MD;  Location: [facility];  Service: Urology;  Laterality: N/A;  . retinal micro aneurysms    . tonsil    . VASECTOMY    . VITRECTOMY         Home Medications    Prior to Admission medications   Medication Sig Start Date End Date Taking? Authorizing Provider  aspirin 81 MG tablet Take 81 mg by mouth at bedtime.     [provider]  buPROPion (WELLBUTRIN) 100 MG tablet Take 100 mg by mouth 2 (two) times daily.     [provider]  cephALEXin (KEFLEX) 500 MG capsule Take 1 capsule (500 mg total) by mouth 4 (four) times daily. 08/31/17   Volanda Napoleon, PA-C  Cholecalciferol (VITAMIN D3) 1000 UNITS CAPS Take 1 capsule by mouth at bedtime.    [provider]  fish oil-omega-3 fatty acids 1000 MG capsule Take 1 g by mouth 2 (two) times daily.     [provider]  insulin lispro protamine-insulin lispro (HUMALOG 75/25) (75-25) 100 UNIT/ML SUSP Inject 8 Units into the skin 2 (two)  times daily with a meal. Before breakfast, before supper    [provider]  LEVEMIR FLEXTOUCH 100 UNIT/ML Pen Inject 8 Units into the skin at bedtime. 07/16/15   [provider]  lovastatin (MEVACOR) 20 MG tablet Take 20 mg by mouth at bedtime.    [provider]  Multiple Vitamin (MULITIVITAMIN WITH MINERALS) TABS Take 1 tablet by mouth at bedtime.    [provider]  naproxen sodium (ANAPROX) 220 MG tablet Take 220 mg by mouth 2 (two) times daily as needed (pain).     [provider]  silodosin (RAPAFLO) 8 MG CAPS capsule Take 8 mg by mouth every evening.     [provider]  temazepam (RESTORIL) 30 MG capsule Take 30 mg by mouth at bedtime as needed for sleep.  05/04/17   [provider]  traZODone (DESYREL) 50 MG tablet Take 50 mg by mouth at bedtime as needed for sleep.  06/18/17   [provider]  VESICARE 5 MG tablet Take 5 mg by mouth daily. 04/08/17   [provider]  vitamin B-12 (CYANOCOBALAMIN) 1000 MCG tablet Take 1,000 mcg by mouth daily.    [provider]  zolpidem (AMBIEN) 5 MG tablet Take 5 mg by mouth at bedtime as needed for sleep.  07/03/17   [provider]  calcium citrate-vitamin D (CITRACAL+D) 315-200 MG-UNIT per tablet Take 1 tablet by mouth daily.  09/04/11  [provider]  cetirizine (ZYRTEC) 10 MG tablet Take 10 mg by mouth daily.  09/04/11  [provider]  insulin lispro (HUMALOG) 100 UNIT/ML injection Inject 100 Units into the skin daily as needed.  09/04/11  [provider]  terazosin (HYTRIN) 5 MG capsule Take 5 mg by mouth at bedtime.  09/04/11  [provider]    Family History Family History  Problem Relation Age of Onset  . Diabetes Father   . Diabetes Sister   . Diabetes Brother     Social History Social History   Tobacco Use  . Smoking status: Never Smoker  . Smokeless tobacco: Never Used  Substance Use Topics  . Alcohol use: No    Frequency: Never  . Drug use: No     Allergies   Bee venom; Glucophage [metformin]; and Shellfish allergy   Review of Systems Review of Systems  Constitutional: Negative for chills and fever.  HENT: Negative for congestion.   Eyes: Negative for visual disturbance.  Respiratory: Negative for cough and shortness of breath.   Cardiovascular: Negative for chest pain.  Gastrointestinal: Negative for abdominal pain, diarrhea, nausea and vomiting.  Genitourinary: Negative for dysuria and hematuria.  Musculoskeletal: Negative for back pain and neck pain.  Skin: Negative for rash.  Neurological:  Negative for dizziness, weakness, numbness and headaches.  All other systems reviewed and are negative.    Physical Exam Updated Vital Signs BP 134/70 (BP Location: Right Arm)   Pulse 80   Temp 98.4 F (36.9 C) (Oral)   Resp 17   Ht 5' 8.5" (1.74 m)   Wt 60.3 kg (133 lb)   SpO2 100%   BMI 19.93 kg/m   Physical Exam  Constitutional: He is oriented to person, place, and time. He appears well-developed and well-nourished.  Sitting comfortably on examination table  HENT:  Head: Normocephalic and atraumatic.  Mouth/Throat: Oropharynx is clear and moist and mucous membranes are normal.  Eyes: Conjunctivae, EOM and lids are normal. Pupils are equal, round, and reactive to light.  Neck: Full passive range of motion without pain.  Cardiovascular: Normal rate, regular rhythm, normal heart sounds and normal pulses. Exam reveals no gallop and no friction rub.  No murmur heard. Pulmonary/Chest: Effort normal and breath sounds normal.  No evidence of respiratory distress. Able to speak in full sentences without difficulty.  Abdominal: Soft. Normal appearance. There is no tenderness. There is no rigidity, no guarding and no CVA tenderness.  Abdomen is soft, non-distended, non-tender. No CVA tenderness bilaterally.   Genitourinary:  Genitourinary Comments: Foley Catheter in place. No hematuria.   Musculoskeletal: Normal range of motion.  Neurological: He is alert and oriented to person, place, and time. GCS eye subscore is 4. GCS verbal subscore is 5. GCS motor subscore is 6.  Follows commands, Moves all extremities  5/5 strength to BUE and BLE  Sensation intact throughout all major nerve distributions  Skin: Skin is warm and dry. Capillary refill takes less than 2 seconds.  Psychiatric: He has a normal mood and affect. His speech is normal.  Nursing note and vitals reviewed.    ED Treatments / Results  Labs (all labs ordered are listed, but only abnormal results are displayed) Labs  Reviewed  BASIC METABOLIC PANEL - Abnormal; Notable for the following components:      Result Value   Glucose, Bld 302 (*)    BUN 32 (*)    All other components within normal limits  CBC WITH DIFFERENTIAL/PLATELET - Abnormal; Notable for the following components:   RBC 4.15 (*)    Hemoglobin 12.7 (*)    HCT 38.2 (*)    Lymphs Abs 5.4 (*)    All other components within normal limits  URINALYSIS, ROUTINE W REFLEX MICROSCOPIC - Abnormal; Notable for the following components:   Glucose, UA >=500 (*)    Hgb urine dipstick SMALL (*)    Ketones, ur 5 (*)    Nitrite POSITIVE (*)    Leukocytes, UA LARGE (*)    Bacteria, UA RARE (*)    Squamous Epithelial / LPF 0-5 (*)    All other components within normal limits  CBG MONITORING, ED - Abnormal; Notable for the following components:   Glucose-Capillary 304 (*)    All other components within normal limits  URINE CULTURE  CBG MONITORING, ED    EKG  EKG Interpretation None       Radiology No results found.  Procedures Procedures (including critical care time)  Medications Ordered in ED Medications  cefTRIAXone (ROCEPHIN) injection 250 mg (250 mg Intramuscular Given 08/31/17 0759)     Initial Impression / Assessment and Plan / ED Course  I have reviewed the triage vital signs and the nursing notes.  Pertinent labs & imaging results that were available during my care of the patient were reviewed by me and considered in my medical decision making (see chart for details).     77 y.o. M with PMH/o Depression, DM, GERD, Fibromyalgia, enlarged prostate who presents for evaluation of foley catheter issue. Feels like he needs a bigger bag. No dysuria, hematuria. Reports some loose stools but no true fecal incontinence. No saddle anthesia, red flags. Patient is afebrile, non-toxic appearing, sitting comfortably on examination table. Vital signs reviewed and stable. Exam not concerning for cauda equina, spinal abscess. Do not suspect DKA  at this time. Plan to check basic labs, UA.   Labs reviewed.  BMP shows glucose is 302.  BUN is slightly bumped at 32 but creatinine is normal.  Bicarb is 24. Anion  gap is 7.  Patient does not appear to be in DKA.  He reports that he has not taken his diabetes medication today which likely explains his hyperglycemia.  There is reviewed to that he usually is between 250s-300s.  CBC shows no evidence of leukocytosis.  There is slight anemia but appears to be consistent with baseline.  UA does show small amount of hemoglobin, nitrates, leukocytes, pyuria.  Records reviewed to that previous UAs do not show nitrates, leukocytes.  Given the significant findings, will plan to treat for UTI.  Urine culture sent.  I discussed results with patient.  Patient requesting new Foley bag which was provided to him in the ED.  Instructed to follow-up with urology.  Will plan to start patient on antibiotic therapy for treatment of UTI.  Patient with stable vitals, ambulating in the department, tolerating p.o. without any difficulty.  Denies any complaints at this time.  Patient stable for discharge. Patient had ample opportunity for questions and discussion. All patient's questions were answered with full understanding. Strict return precautions discussed. Patient expresses understanding and agreement to plan.   Final Clinical Impressions(s) / ED Diagnoses   Final diagnoses:  Acute cyclitis    ED Discharge Orders        Ordered    cephALEXin (KEFLEX) 500 MG capsule  4 times daily     08/31/17 0813       Volanda Napoleon, PA-C 08/31/17 1523    Orpah Greek, MD 09/04/17 [phone]

## 2017-09-02 LAB — URINE CULTURE

## 2017-09-03 ENCOUNTER — Telehealth: Payer: Self-pay | Admitting: *Deleted

## 2017-09-03 NOTE — Telephone Encounter (Signed)
Post ED Visit - Positive Culture Follow-up  Culture report reviewed by antimicrobial stewardship pharmacist:  []  Elenor Quinones, Pharm.D. []  Heide Guile, Pharm.D., BCPS AQ-ID []  Parks Neptune, Pharm.D., BCPS []  Alycia Rossetti, Pharm.D., BCPS []  [place], Pharm.D., BCPS, AAHIVP []  Legrand Como, Pharm.D., BCPS, AAHIVP []  Salome Arnt, PharmD, BCPS []  Jalene Mullet, PharmD []  Vincenza Hews, PharmD, BCPS Leroy Libman, PharmD  Positive urine culture Treated with Cephalexin, organism sensitive to the same and no further patient follow-up is required at this time.  Harlon Flor [facility] 09/03/2017, 10:54 AM

## 2017-09-04 DIAGNOSIS — R3914 Feeling of incomplete bladder emptying: Secondary | ICD-10-CM | POA: Diagnosis not present

## 2017-09-06 ENCOUNTER — Encounter (HOSPITAL_COMMUNITY): Payer: Self-pay

## 2017-09-06 ENCOUNTER — Inpatient Hospital Stay (HOSPITAL_COMMUNITY)
Admission: EM | Admit: 2017-09-06 | Discharge: 2017-09-10 | DRG: 690 | Disposition: A | Discharge status: Skilled Nursing Facility | Payer: Medicare Other | Attending: Internal Medicine | Admitting: Internal Medicine

## 2017-09-06 ENCOUNTER — Other Ambulatory Visit: Payer: Self-pay

## 2017-09-06 DIAGNOSIS — Z9841 Cataract extraction status, right eye: Secondary | ICD-10-CM

## 2017-09-06 DIAGNOSIS — N10 Acute tubulo-interstitial nephritis: Secondary | ICD-10-CM | POA: Diagnosis not present

## 2017-09-06 DIAGNOSIS — G9340 Encephalopathy, unspecified: Secondary | ICD-10-CM

## 2017-09-06 DIAGNOSIS — E785 Hyperlipidemia, unspecified: Secondary | ICD-10-CM | POA: Diagnosis present

## 2017-09-06 DIAGNOSIS — R739 Hyperglycemia, unspecified: Secondary | ICD-10-CM | POA: Diagnosis present

## 2017-09-06 DIAGNOSIS — F0150 Vascular dementia without behavioral disturbance: Secondary | ICD-10-CM | POA: Diagnosis present

## 2017-09-06 DIAGNOSIS — Z7982 Long term (current) use of aspirin: Secondary | ICD-10-CM

## 2017-09-06 DIAGNOSIS — R9082 White matter disease, unspecified: Secondary | ICD-10-CM | POA: Diagnosis present

## 2017-09-06 DIAGNOSIS — M797 Fibromyalgia: Secondary | ICD-10-CM | POA: Diagnosis present

## 2017-09-06 DIAGNOSIS — N400 Enlarged prostate without lower urinary tract symptoms: Secondary | ICD-10-CM | POA: Diagnosis present

## 2017-09-06 DIAGNOSIS — Z981 Arthrodesis status: Secondary | ICD-10-CM

## 2017-09-06 DIAGNOSIS — N3000 Acute cystitis without hematuria: Principal | ICD-10-CM | POA: Diagnosis present

## 2017-09-06 DIAGNOSIS — G9389 Other specified disorders of brain: Secondary | ICD-10-CM | POA: Diagnosis present

## 2017-09-06 DIAGNOSIS — Z9842 Cataract extraction status, left eye: Secondary | ICD-10-CM

## 2017-09-06 DIAGNOSIS — B965 Pseudomonas (aeruginosa) (mallei) (pseudomallei) as the cause of diseases classified elsewhere: Secondary | ICD-10-CM | POA: Diagnosis present

## 2017-09-06 DIAGNOSIS — E119 Type 2 diabetes mellitus without complications: Secondary | ICD-10-CM | POA: Diagnosis present

## 2017-09-06 DIAGNOSIS — N179 Acute kidney failure, unspecified: Secondary | ICD-10-CM | POA: Diagnosis present

## 2017-09-06 DIAGNOSIS — Z91013 Allergy to seafood: Secondary | ICD-10-CM

## 2017-09-06 DIAGNOSIS — Z833 Family history of diabetes mellitus: Secondary | ICD-10-CM | POA: Diagnosis not present

## 2017-09-06 DIAGNOSIS — R41841 Cognitive communication deficit: Secondary | ICD-10-CM | POA: Diagnosis present

## 2017-09-06 DIAGNOSIS — K219 Gastro-esophageal reflux disease without esophagitis: Secondary | ICD-10-CM | POA: Diagnosis present

## 2017-09-06 DIAGNOSIS — F329 Major depressive disorder, single episode, unspecified: Secondary | ICD-10-CM | POA: Diagnosis present

## 2017-09-06 DIAGNOSIS — E1165 Type 2 diabetes mellitus with hyperglycemia: Secondary | ICD-10-CM | POA: Diagnosis present

## 2017-09-06 DIAGNOSIS — N390 Urinary tract infection, site not specified: Secondary | ICD-10-CM | POA: Diagnosis present

## 2017-09-06 DIAGNOSIS — R4182 Altered mental status, unspecified: Secondary | ICD-10-CM | POA: Diagnosis not present

## 2017-09-06 DIAGNOSIS — C9110 Chronic lymphocytic leukemia of B-cell type not having achieved remission: Secondary | ICD-10-CM | POA: Diagnosis present

## 2017-09-06 DIAGNOSIS — R410 Disorientation, unspecified: Secondary | ICD-10-CM | POA: Diagnosis not present

## 2017-09-06 DIAGNOSIS — F330 Major depressive disorder, recurrent, mild: Secondary | ICD-10-CM | POA: Diagnosis present

## 2017-09-06 DIAGNOSIS — F419 Anxiety disorder, unspecified: Secondary | ICD-10-CM | POA: Diagnosis present

## 2017-09-06 DIAGNOSIS — Z888 Allergy status to other drugs, medicaments and biological substances status: Secondary | ICD-10-CM | POA: Diagnosis not present

## 2017-09-06 DIAGNOSIS — Z79899 Other long term (current) drug therapy: Secondary | ICD-10-CM

## 2017-09-06 DIAGNOSIS — Z91030 Bee allergy status: Secondary | ICD-10-CM

## 2017-09-06 DIAGNOSIS — Z794 Long term (current) use of insulin: Secondary | ICD-10-CM

## 2017-09-06 DIAGNOSIS — G9349 Other encephalopathy: Secondary | ICD-10-CM | POA: Diagnosis present

## 2017-09-06 DIAGNOSIS — R262 Difficulty in walking, not elsewhere classified: Secondary | ICD-10-CM | POA: Diagnosis present

## 2017-09-06 DIAGNOSIS — R2689 Other abnormalities of gait and mobility: Secondary | ICD-10-CM | POA: Diagnosis present

## 2017-09-06 LAB — CBC WITH DIFFERENTIAL/PLATELET
Basophils Absolute: 0 10*3/uL (ref 0.0–0.1)
Basophils Relative: 0 %
EOS ABS: 0.1 10*3/uL (ref 0.0–0.7)
Eosinophils Relative: 2 %
HCT: 42.6 % (ref 39.0–52.0)
Hemoglobin: 14.8 g/dL (ref 13.0–17.0)
Lymphocytes Relative: 15 %
Lymphs Abs: 0.9 10*3/uL (ref 0.7–4.0)
MCH: 31.4 pg (ref 26.0–34.0)
MCHC: 34.7 g/dL (ref 30.0–36.0)
MCV: 90.3 fL (ref 78.0–100.0)
Monocytes Absolute: 0.5 10*3/uL (ref 0.1–1.0)
Monocytes Relative: 8 %
NEUTROS PCT: 75 %
Neutro Abs: 4.2 10*3/uL (ref 1.7–7.7)
PLATELETS: 218 10*3/uL (ref 150–400)
RBC: 4.72 MIL/uL (ref 4.22–5.81)
RDW: 13.8 % (ref 11.5–15.5)
WBC: 5.7 10*3/uL (ref 4.0–10.5)

## 2017-09-06 LAB — I-STAT CG4 LACTIC ACID, ED
Lactic Acid, Venous: 1.86 mmol/L (ref 0.5–1.9)
Lactic Acid, Venous: 1.71 mmol/L (ref 0.5–1.9)

## 2017-09-06 LAB — URINALYSIS, ROUTINE W REFLEX MICROSCOPIC
Bilirubin Urine: NEGATIVE
GLUCOSE, UA: NEGATIVE mg/dL
Ketones, ur: 5 mg/dL — AB
Nitrite: NEGATIVE
Protein, ur: NEGATIVE mg/dL
Specific Gravity, Urine: 1.005 (ref 1.005–1.030)
Squamous Epithelial / LPF: NONE SEEN
pH: 8 (ref 5.0–8.0)

## 2017-09-06 LAB — COMPREHENSIVE METABOLIC PANEL
ALK PHOS: 84 U/L (ref 38–126)
ALT: 28 U/L (ref 17–63)
AST: 28 U/L (ref 15–41)
Albumin: 4.6 g/dL (ref 3.5–5.0)
Anion gap: 11 (ref 5–15)
BILIRUBIN TOTAL: 1.4 mg/dL — AB (ref 0.3–1.2)
BUN: 22 mg/dL — ABNORMAL HIGH (ref 6–20)
CALCIUM: 9.7 mg/dL (ref 8.9–10.3)
CO2: 25 mmol/L (ref 22–32)
Chloride: 100 mmol/L — ABNORMAL LOW (ref 101–111)
Creatinine, Ser: 0.78 mg/dL (ref 0.61–1.24)
Glucose, Bld: 181 mg/dL — ABNORMAL HIGH (ref 65–99)
Potassium: 3.7 mmol/L (ref 3.5–5.1)
Sodium: 136 mmol/L (ref 135–145)
TOTAL PROTEIN: 7.5 g/dL (ref 6.5–8.1)

## 2017-09-06 LAB — GLUCOSE, CAPILLARY: Glucose-Capillary: 168 mg/dL — ABNORMAL HIGH (ref 65–99)

## 2017-09-06 MED ORDER — ENOXAPARIN SODIUM 40 MG/0.4ML ~~LOC~~ SOLN
40.0000 mg | SUBCUTANEOUS | Status: DC
  Administered 2017-09-06 – 2017-09-09 (×4): 40 mg via SUBCUTANEOUS
  Filled 2017-09-06 (×5): qty 0.4

## 2017-09-06 MED ORDER — INSULIN DETEMIR 100 UNIT/ML FLEXPEN: 8.0000 [IU] | PEN_INJECTOR | Freq: Every day | SUBCUTANEOUS | Status: DC

## 2017-09-06 MED ORDER — INSULIN ASPART 100 UNIT/ML ~~LOC~~ SOLN
0.0000 [IU] | Freq: Three times a day (TID) | SUBCUTANEOUS | Status: DC
  Administered 2017-09-07: 1 [IU] via SUBCUTANEOUS
  Administered 2017-09-07: 7 [IU] via SUBCUTANEOUS
  Administered 2017-09-07: 2 [IU] via SUBCUTANEOUS
  Administered 2017-09-08: 5 [IU] via SUBCUTANEOUS
  Administered 2017-09-08: 2 [IU] via SUBCUTANEOUS
  Administered 2017-09-08: 3 [IU] via SUBCUTANEOUS
  Administered 2017-09-09: 5 [IU] via SUBCUTANEOUS
  Administered 2017-09-09 (×2): 3 [IU] via SUBCUTANEOUS
  Administered 2017-09-10: 1 [IU] via SUBCUTANEOUS
  Administered 2017-09-10: 3 [IU] via SUBCUTANEOUS

## 2017-09-06 MED ORDER — PRAVASTATIN SODIUM 20 MG PO TABS
20.0000 mg | ORAL_TABLET | Freq: Every day | ORAL | Status: DC
  Administered 2017-09-07 – 2017-09-10 (×4): 20 mg via ORAL
  Filled 2017-09-06 (×4): qty 1

## 2017-09-06 MED ORDER — INSULIN DETEMIR 100 UNIT/ML ~~LOC~~ SOLN
8.0000 [IU] | Freq: Every day | SUBCUTANEOUS | Status: DC
  Administered 2017-09-06 – 2017-09-09 (×4): 8 [IU] via SUBCUTANEOUS
  Filled 2017-09-06 (×5): qty 0.08

## 2017-09-06 MED ORDER — ACETAMINOPHEN 325 MG PO TABS: 650.0000 mg | ORAL_TABLET | Freq: Four times a day (QID) | ORAL | Status: DC | PRN

## 2017-09-06 MED ORDER — INSULIN ASPART 100 UNIT/ML ~~LOC~~ SOLN
0.0000 [IU] | Freq: Every day | SUBCUTANEOUS | Status: DC
  Administered 2017-09-07: 2 [IU] via SUBCUTANEOUS
  Administered 2017-09-08 – 2017-09-09 (×2): 3 [IU] via SUBCUTANEOUS

## 2017-09-06 MED ORDER — BUPROPION HCL 100 MG PO TABS
100.0000 mg | ORAL_TABLET | Freq: Two times a day (BID) | ORAL | Status: DC
  Administered 2017-09-06 – 2017-09-10 (×6): 100 mg via ORAL
  Filled 2017-09-06 (×9): qty 1

## 2017-09-06 MED ORDER — VITAMIN B-12 1000 MCG PO TABS
1000.0000 ug | ORAL_TABLET | Freq: Every day | ORAL | Status: DC
  Administered 2017-09-07 – 2017-09-10 (×4): 1000 ug via ORAL
  Filled 2017-09-06 (×4): qty 1

## 2017-09-06 MED ORDER — SODIUM CHLORIDE 0.9 % IV SOLN
1.0000 g | INTRAVENOUS | Status: DC
  Administered 2017-09-07 – 2017-09-08 (×2): 1 g via INTRAVENOUS
  Filled 2017-09-06 (×2): qty 1

## 2017-09-06 MED ORDER — ASPIRIN EC 81 MG PO TBEC
81.0000 mg | DELAYED_RELEASE_TABLET | Freq: Every day | ORAL | Status: DC
  Administered 2017-09-06 – 2017-09-09 (×4): 81 mg via ORAL
  Filled 2017-09-06 (×4): qty 1

## 2017-09-06 MED ORDER — ACETAMINOPHEN 650 MG RE SUPP: 650.0000 mg | Freq: Four times a day (QID) | RECTAL | Status: DC | PRN

## 2017-09-06 MED ORDER — SODIUM CHLORIDE 0.9 % IV SOLN
INTRAVENOUS | Status: DC
  Administered 2017-09-06 – 2017-09-09 (×4): via INTRAVENOUS

## 2017-09-06 MED ORDER — TRAZODONE HCL 50 MG PO TABS
50.0000 mg | ORAL_TABLET | Freq: Every evening | ORAL | Status: DC | PRN
  Administered 2017-09-06 – 2017-09-09 (×3): 50 mg via ORAL
  Filled 2017-09-06 (×3): qty 1

## 2017-09-06 MED ORDER — TAMSULOSIN HCL 0.4 MG PO CAPS
0.4000 mg | ORAL_CAPSULE | Freq: Every day | ORAL | Status: DC
  Administered 2017-09-06 – 2017-09-09 (×4): 0.4 mg via ORAL
  Filled 2017-09-06 (×4): qty 1

## 2017-09-06 MED ORDER — SODIUM CHLORIDE 0.9 % IV SOLN
1.0000 g | Freq: Once | INTRAVENOUS | Status: AC
  Administered 2017-09-06: 1 g via INTRAVENOUS
  Filled 2017-09-06: qty 10

## 2017-09-06 NOTE — ED Notes (Signed)
ED TO INPATIENT HANDOFF REPORT  Name/Age/Gender Hayden [name] 77 y.o. male  Code Status Code Status History    This patient does not have a recorded code status. Please follow your organizational policy for patients in this situation.    Advance Directive Documentation     Most Recent Value  Type of Advance Directive  Healthcare Power of Attorney  Pre-existing out of facility DNR order (yellow form or pink MOST form)  No data  "MOST" Form in Place?  No data      Home/SNF/Other Home  Chief Complaint Possible UTI  Level of Care/Admitting Diagnosis ED Disposition    ED Disposition Condition Comment   Admit  Hospital Area: [facility] [[id number]]  Level of Care: Telemetry [5]  Admit to tele based on following criteria: Monitor for Ischemic changes  Diagnosis: ARF (acute renal failure) [facility]) [[id number]]  Admitting Physician: Jani Gravel [[id number]]  Attending Physician: Jani Gravel [id number]  Estimated length of stay: past midnight tomorrow  Certification:: I certify this patient will need inpatient services for at least 2 midnights  PT Class (Do Not Modify): Inpatient [101]  PT Acc Code (Do Not Modify): Private [1]       Medical History Past Medical History:  Diagnosis Date  . Depression   . Diabetes mellitus   . Enlarged prostate   . Fibromyalgia   . GERD (gastroesophageal reflux disease)    reflux intermittent  . Hyperlipidemia   . 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 Bee Venom Other (See Comments)    Bump where stung and took days to go away  . Glucophage [Metformin] Diarrhea    "i will never take it again -stool incontinent "  . Shellfish Allergy Swelling    IV Location/Drains/Wounds Patient Lines/Drains/Airways Status   Active Line/Drains/Airways    Name:   Placement date:   Placement time:   Site:   Days:   Peripheral IV 09/06/17 Right Forearm   09/06/17    2055     Forearm   less than 1   Urethral Catheter Samantha B, NT  Non-latex 16 Fr.   07/16/17    1730    Non-latex   52   Incision 07/26/12 Perineum   07/26/12    0834     1868          Labs/Imaging Results for orders placed or performed during the hospital encounter of 09/06/17 (from the past 48 hour(s))  Comprehensive metabolic panel     Status: Abnormal   Collection Time: 09/06/17  4:26 PM  Result Value Ref Range   Sodium 136 135 - 145 mmol/L   Potassium 3.7 3.5 - 5.1 mmol/L   Chloride 100 (L) 101 - 111 mmol/L   CO2 25 22 - 32 mmol/L   Glucose, Bld 181 (H) 65 - 99 mg/dL   BUN 22 (H) 6 - 20 mg/dL   Creatinine, Ser 0.78 0.61 - 1.24 mg/dL   Calcium 9.7 8.9 - 10.3 mg/dL   Total Protein 7.5 6.5 - 8.1 g/dL   Albumin 4.6 3.5 - 5.0 g/dL   AST 28 15 - 41 U/L   ALT 28 17 - 63 U/L   Alkaline Phosphatase 84 38 - 126 U/L   Total Bilirubin 1.4 (H) 0.3 - 1.2 mg/dL   GFR calc non Af Amer >60 >60 mL/min   GFR calc Af Amer >60 >60 mL/min  Comment: (NOTE) The eGFR has been calculated using the CKD EPI equation. This calculation has not been validated in all clinical situations. eGFR's persistently <60 mL/min signify possible Chronic Kidney Disease.    Anion gap 11 5 - 15    Comment: Performed at [facility], [name] [address], [place], [place] [address]  CBC with Differential     Status: None   Collection Time: 09/06/17  4:26 PM  Result Value Ref Range   WBC 5.7 4.0 - 10.5 K/uL   RBC 4.72 4.22 - 5.81 MIL/uL   Hemoglobin 14.8 13.0 - 17.0 g/dL   HCT 42.6 39.0 - 52.0 %   MCV 90.3 78.0 - 100.0 fL   MCH 31.4 26.0 - 34.0 pg   MCHC 34.7 30.0 - 36.0 g/dL   RDW 13.8 11.5 - 15.5 %   Platelets 218 150 - 400 K/uL   Neutrophils Relative % 75 %   Lymphocytes Relative 15 %   Monocytes Relative 8 %   Eosinophils Relative 2 %   Basophils Relative 0 %   Neutro Abs 4.2 1.7 - 7.7 K/uL   Lymphs Abs 0.9 0.7 - 4.0 K/uL   Monocytes Absolute 0.5 0.1 - 1.0 K/uL   Eosinophils Absolute 0.1  0.0 - 0.7 K/uL   Basophils Absolute 0.0 0.0 - 0.1 K/uL   RBC Morphology RARE NRBCs     Comment: Performed at [facility], [place] [address] [place], [place] [address]  I-Stat CG4 Lactic Acid, ED     Status: None   Collection Time: 09/06/17  4:32 PM  Result Value Ref Range   Lactic Acid, Venous 1.86 0.5 - 1.9 mmol/L  I-Stat CG4 Lactic Acid, ED     Status: None   Collection Time: 09/06/17  6:41 PM  Result Value Ref Range   Lactic Acid, Venous 1.71 0.5 - 1.9 mmol/L  Urinalysis, Routine w reflex microscopic     Status: Abnormal   Collection Time: 09/06/17  7:37 PM  Result Value Ref Range   Color, Urine YELLOW YELLOW   APPearance CLEAR CLEAR   Specific Gravity, Urine 1.005 1.005 - 1.030   pH 8.0 5.0 - 8.0   Glucose, UA NEGATIVE NEGATIVE mg/dL   Hgb urine dipstick SMALL (A) NEGATIVE   Bilirubin Urine NEGATIVE NEGATIVE   Ketones, ur 5 (A) NEGATIVE mg/dL   Protein, ur NEGATIVE NEGATIVE mg/dL   Nitrite NEGATIVE NEGATIVE   Leukocytes, UA LARGE (A) NEGATIVE   RBC / HPF 0-5 0 - 5 RBC/hpf   WBC, UA TOO NUMEROUS TO COUNT 0 - 5 WBC/hpf   Bacteria, UA RARE (A) NONE SEEN   Squamous Epithelial / LPF NONE SEEN NONE SEEN    Comment: Performed at [facility], [place] [address], [place], [place] [address]   No results found.  Pending Labs Unresulted Labs (From admission, onward)   Start     Ordered   09/06/17 2001  Urine culture  STAT,   STAT     09/06/17 2000   Signed and Held  Creatinine, serum  (enoxaparin (LOVENOX)    CrCl < 30 ml/min)  Weekly,   R    Comments:  while on enoxaparin therapy.    Signed and Held   Signed and Held  Comprehensive metabolic panel  Tomorrow morning,   R     Signed and Held   Signed and Held  CBC  Tomorrow morning,   R     Signed and Held  Vitals/Pain Today's Vitals   09/06/17 1542 09/06/17 1829 09/06/17 2038  BP: 119/65 139/77 128/62  Pulse: 86 80 85  Resp: '18 16 20  ' Temp: 98.3 F (36.8 C)    TempSrc: Oral     SpO2: 100% 100% 100%  PainSc: 0-No pain      Isolation Precautions No active isolations  Medications Medications  cefTRIAXone (ROCEPHIN) 1 g in sodium chloride 0.9 % 100 mL IVPB (1 g Intravenous New Bag/Given 09/06/17 2058)    Mobility walks

## 2017-09-06 NOTE — ED Provider Notes (Signed)
[facility] DEPT Provider Note   CSN: [id number] Arrival date & time: 09/06/17  1440     History   Chief Complaint Chief Complaint  Patient presents with  . Urinary Tract Infection    HPI Hayden [name] is a 77 y.o. male.  HPI Hayden [name] is a 77 y.o. male with history of depression, diabetes, CLL, presents to emergency department with complaint of elevated blood sugars at home.  He states to me that he wants his "serum levels checked."  Patient brought in by home health aide, who was concerned that patient has been intermittently confused.  It is unclear if he had his Foley catheter removed by a professional or he removed it himself.  He told me that it was uncomfortable so he had to take care of it, and he told me he pulled it out himself" created a big mess."  He states since taking the catheter out he has been able to urinate normally.  He states that he was told he may have a prostate problem.  He states that he has had a hard time controlling his blood sugars at home and just would like to check his levels today. He was on keflex, started on 08/31/17, states he finished prescription.   Past Medical History:  Diagnosis Date  . Depression   . Diabetes mellitus   . Enlarged prostate   . Fibromyalgia   . GERD (gastroesophageal reflux disease)    reflux intermittent  . Hyperlipidemia   . Leukemia [facility]) chronic lymphocytic   2008 diagnosed-monitored Dr     Patient Active Problem List   Diagnosis Date Noted  . Type 2 diabetes mellitus with complication ([place]) 83/15/1761  . 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 ADENOIDECTOMY    . CARPAL TUNNEL RELEASE     rt hand  . CATARACT EXTRACTION, BILATERAL    . CERVICAL FUSION  1998  . CYSTOSCOPY  07/26/2012   Procedure: CYSTOSCOPY;  Surgeon: Ailene Rud, MD;  Location: [facility];  Service: Urology;   Laterality: N/A;  DIAGNOSTIC CYSTO PROSTATE ULTRASOUND      . PROSTATE BIOPSY  07/26/2012   Procedure: PROSTATE BIOPSY;  Surgeon: Ailene Rud, MD;  Location: [facility];  Service: Urology;  Laterality: N/A;  . retinal micro aneurysms    . tonsil    . VASECTOMY    . VITRECTOMY         Home Medications    Prior to Admission medications   Medication Sig Start Date End Date Taking? Authorizing Provider  aspirin 81 MG tablet Take 81 mg by mouth at bedtime.     [provider]  buPROPion (WELLBUTRIN) 100 MG tablet Take 100 mg by mouth 2 (two) times daily.     [provider]  cephALEXin (KEFLEX) 500 MG capsule Take 1 capsule (500 mg total) by mouth 4 (four) times daily. 08/31/17   Volanda Napoleon, PA-C  Cholecalciferol (VITAMIN D3) 1000 UNITS CAPS Take 1 capsule by mouth at bedtime.    [provider]  fish oil-omega-3 fatty acids 1000 MG capsule Take 1 g by mouth 2 (two) times daily.     [provider]  insulin lispro protamine-insulin lispro (HUMALOG 75/25) (75-25) 100 UNIT/ML SUSP Inject 8 Units into the skin 2 (two) times daily with a meal. Before breakfast, before supper    [provider]  LEVEMIR FLEXTOUCH 100 UNIT/ML Pen Inject 8 Units into the skin at bedtime. 07/16/15   [provider]  lovastatin (MEVACOR) 20 MG tablet Take 20 mg by mouth at bedtime.    [provider]  Multiple Vitamin (MULITIVITAMIN WITH MINERALS) TABS Take 1 tablet by mouth at bedtime.    [provider]  naproxen sodium (ANAPROX) 220 MG tablet Take 220 mg by mouth 2 (two) times daily as needed (pain).     [provider]  silodosin (RAPAFLO) 8 MG CAPS capsule Take 8 mg by mouth every evening.     [provider]  temazepam (RESTORIL) 30 MG capsule Take 30 mg by mouth at bedtime as needed for sleep.  05/04/17   [provider]  traZODone (DESYREL) 50 MG tablet Take 50 mg by mouth at  bedtime as needed for sleep.  06/18/17   [provider]  VESICARE 5 MG tablet Take 5 mg by mouth daily. 04/08/17   [provider]  vitamin B-12 (CYANOCOBALAMIN) 1000 MCG tablet Take 1,000 mcg by mouth daily.    [provider]  zolpidem (AMBIEN) 5 MG tablet Take 5 mg by mouth at bedtime as needed for sleep.  07/03/17   [provider]  calcium citrate-vitamin D (CITRACAL+D) 315-200 MG-UNIT per tablet Take 1 tablet by mouth daily.  09/04/11  [provider]  cetirizine (ZYRTEC) 10 MG tablet Take 10 mg by mouth daily.  09/04/11  [provider]  insulin lispro (HUMALOG) 100 UNIT/ML injection Inject 100 Units into the skin daily as needed.  09/04/11  [provider]  terazosin (HYTRIN) 5 MG capsule Take 5 mg by mouth at bedtime.  09/04/11  [provider]    Family History Family History  Problem Relation Age of Onset  . Diabetes Father   . Diabetes Sister   . Diabetes Brother     Social History Social History   Tobacco Use  . Smoking status: Never Smoker  . Smokeless tobacco: Never Used  Substance Use Topics  . Alcohol use: No    Frequency: Never  . Drug use: No     Allergies   Bee venom; Glucophage [metformin]; and Shellfish allergy   Review of Systems Review of Systems  Constitutional: Negative for chills and fever.  Respiratory: Negative for cough, chest tightness and shortness of breath.   Cardiovascular: Negative for chest pain, palpitations and leg swelling.  Gastrointestinal: Negative for abdominal distention, abdominal pain, diarrhea, nausea and vomiting.  Genitourinary: Negative for difficulty urinating, dysuria, frequency, hematuria, penile pain, penile swelling, scrotal swelling, testicular pain and urgency.  Musculoskeletal: Negative for arthralgias, myalgias, neck pain and neck stiffness.  Skin: Negative for rash.  Allergic/Immunologic: Negative for immunocompromised state.  Neurological: Negative  for dizziness, weakness, light-headedness, numbness and headaches.  All other systems reviewed and are negative.    Physical Exam Updated Vital Signs BP 119/65 (BP Location: Left Arm)   Pulse 86   Temp 98.3 F (36.8 C) (Oral)   Resp 18   SpO2 100%   Physical Exam  Constitutional: He appears well-developed and well-nourished. No distress.  Pt wearing diaper over sweat pants  HENT:  Head: Normocephalic and atraumatic.  Eyes: Conjunctivae are normal.  Neck: Neck supple.  Cardiovascular: Normal rate, regular rhythm and normal heart sounds.  Pulmonary/Chest: Effort normal. No respiratory distress. He has no wheezes. He has no rales.  Abdominal: Soft. Bowel sounds are normal. He exhibits no distension. There is no tenderness. There is no  rebound and no guarding.  Musculoskeletal: He exhibits no edema.  Neurological: He is alert.  Skin: Skin is warm and dry.  Nursing note and vitals reviewed.    ED Treatments / Results  Labs (all labs ordered are listed, but only abnormal results are displayed) Labs Reviewed  COMPREHENSIVE METABOLIC PANEL - Abnormal; Notable for the following components:      Result Value   Chloride 100 (*)    Glucose, Bld 181 (*)    BUN 22 (*)    Total Bilirubin 1.4 (*)    All other components within normal limits  URINALYSIS, ROUTINE W REFLEX MICROSCOPIC - Abnormal; Notable for the following components:   Hgb urine dipstick SMALL (*)    Ketones, ur 5 (*)    Leukocytes, UA LARGE (*)    Bacteria, UA RARE (*)    All other components within normal limits  URINE CULTURE  CBC WITH DIFFERENTIAL/PLATELET  I-STAT CG4 LACTIC ACID, ED  I-STAT CG4 LACTIC ACID, ED    EKG  EKG Interpretation None       Radiology No results found.  Procedures Procedures (including critical care time)  Medications Ordered in ED Medications - No data to display   Initial Impression / Assessment and Plan / ED Course  I have reviewed the triage vital signs and the  nursing notes.  Pertinent labs & imaging results that were available during my care of the patient were reviewed by me and considered in my medical decision making (see chart for details).     Pt with recent UTI and urinary retention. Unclear if he pulled his foley out or had it taken out by a professional. Pt is a poor historian.  At this time he is alert and oriented x3, however he is speech and his answers to some of the questions are very interesting.  He is also of note be wearing a diaper over his sweat pants.  He is a little upset right now because he has had to wait for 2 hours to be seen.  I explained to him that we already got his blood  work back and it looks well, with his sugar being 181, andI explained to him I do want to check his urine analysis since rencent infection and retention. Also will add bladder scanner.    Bladder scanner showed 100 cc in the bladder.  Patient on and off confused in the room, one point in time trying to put water in his shoes in the urinal.  And then he was trying to put the urinal on his foot like a shoe.  I did get in touch with patient's son who is his power of attorney, who advised me that this is not patient's baseline and he is more confused than usual.  He thinks that this is related to the UTI.  Patient just finished antibiotics for urinary tract infection today, sounds like he has been on Keflex.  Will get urine analysis repeat today.  Blood work with no significant abnormalities today.   Urine analysis shows infection with too numerous to count white blood cells, large leukocytes, rare bacteria.  This is a cath sample.  I did send cultures.  He is otherwise nontoxic appearing, however given acute confusion, will admit.  Rocephin ordered. Pt currently in the room trying to make a "tent out of sheets."  I discussed his results with him and advised that we should bring him in the hospital and he agreed.  Vitals:  09/06/17 1542 09/06/17 1829 09/06/17  2038  BP: 119/65 139/77 128/62  Pulse: 86 80 85  Resp: 18 16 20   Temp: 98.3 F (36.8 C)    TempSrc: Oral    SpO2: 100% 100% 100%     Final Clinical Impressions(s) / ED Diagnoses   Final diagnoses:  Acute cystitis without hematuria  Acute encephalopathy    ED Discharge Orders    None       Jeannett Senior, PA-C 09/06/17 2057    Sherwood Gambler, MD 09/06/17 [phone]

## 2017-09-06 NOTE — ED Triage Notes (Signed)
He c/o being confused at times today and yesterday. He has had foley catheter removed a couple of days ago. He states he needed the catheter for "prostate infection". He is lucid as I write this. He is brought to [place] by a home health aide.

## 2017-09-06 NOTE — ED Notes (Signed)
Pt wants to leave now. Pt does not want to stay. Going to inform ED PA now.

## 2017-09-06 NOTE — ED Notes (Signed)
Patient walked out of room with depends pulled down. Patient was redirected back to room and instructed to put clothes back on.

## 2017-09-06 NOTE — H&P (Signed)
[facility] H&P   Patient Demographics:    Hayden [name], is a 77 y.o. male  MRN: [id number]   DOB - Nov 09, 1940  Admit Date - 09/06/2017  Outpatient Primary MD for the patient is Juanell Fairly, MD (Inactive)  Referring MD/NP/PA:   Freddie Apley  Outpatient Specialists:    Patient coming from:  home  Chief Complaint  Patient presents with  . Urinary Tract Infection      HPI:    Hayden [name]  is a 77 y.o. male, w hyperlipidemia, dm2, CLL,  Depression presents with intermittent confusion per home health aide per ED.  Pt seems as thought may have some dementia.  Pt is a poor historian.  Pt was on keflex in the apst 08/31/2017 and has finished script for uti.    In Ed,  Na 136, K 3.7, Bun, 22, Creatinine 0.78 Ast 28, Alt 28 Wbc 5.7, Hgb 14.8, Plt 218 La 1.86  Urinalysis Wbc tntc rbc 0-5  Pt will be admitted for AMS secondary to UTI     Review of systems:    In addition to the HPI above, pt is a poor historian   No Fever-chills, No Headache, No changes with Vision or hearing, No problems swallowing food or Liquids, No Chest pain, Cough or Shortness of Breath, No Abdominal pain, No Nausea or Vommitting, Bowel movements are regular, No Blood in stool or Urine, No dysuria, No new skin rashes or bruises, No new joints pains-aches,  No new weakness, tingling, numbness in any extremity, No recent weight gain or loss, No polyuria, polydypsia or polyphagia, No significant Mental Stressors.  A full 10 point Review of Systems was done, except as stated above, all other Review of Systems were negative.   With Past History of the following :    Past Medical History:  Diagnosis Date  . Depression   . Diabetes mellitus   . Enlarged prostate   . Fibromyalgia   . GERD (gastroesophageal reflux disease)    reflux intermittent  . Hyperlipidemia   . 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 ADENOIDECTOMY    . CARPAL TUNNEL RELEASE     rt hand  . CATARACT EXTRACTION, BILATERAL    . CERVICAL FUSION  1998  . CYSTOSCOPY  07/26/2012   Procedure: CYSTOSCOPY;  Surgeon: Ailene Rud, MD;  Location: [facility];  Service: Urology;  Laterality: N/A;  DIAGNOSTIC CYSTO PROSTATE ULTRASOUND      . PROSTATE BIOPSY  07/26/2012   Procedure: PROSTATE BIOPSY;  Surgeon: Ailene Rud, MD;  [facility];  Service: Urology;  Laterality: N/A;  . retinal micro aneurysms    . tonsil    . VASECTOMY    . VITRECTOMY  Social History:     Social History   Tobacco Use  . Smoking status: Never Smoker  . Smokeless tobacco: Never Used  Substance Use Topics  . Alcohol use: No    Frequency: Never     Lives - at home,  Mobility - walks by self   Family History :     Family History  Problem Relation Age of Onset  . Diabetes Father   . Diabetes Sister   . Diabetes Brother      Home Medications:   Prior to Admission medications   Medication Sig Start Date End Date Taking? Authorizing Provider  aspirin 81 MG tablet Take 81 mg by mouth at bedtime.    Yes [provider]  buPROPion (WELLBUTRIN) 100 MG tablet Take 100 mg by mouth 2 (two) times daily.    Yes [provider]  cephALEXin (KEFLEX) 500 MG capsule Take 1 capsule (500 mg total) by mouth 4 (four) times daily. 08/31/17  Yes Volanda Napoleon, PA-C  Cholecalciferol (VITAMIN D3) 1000 UNITS CAPS Take 1 capsule by mouth at bedtime.   Yes [provider]  fish oil-omega-3 fatty acids 1000 MG capsule Take 1 g by mouth 2 (two) times daily.    Yes [provider]  insulin lispro protamine-insulin lispro (HUMALOG 75/25) (75-25) 100 UNIT/ML SUSP Inject 8 Units into the skin 2 (two) times daily with a meal. Before breakfast, before supper   Yes  [provider]  LEVEMIR FLEXTOUCH 100 UNIT/ML Pen Inject 8 Units into the skin at bedtime. 07/16/15  Yes [provider]  lovastatin (MEVACOR) 20 MG tablet Take 20 mg by mouth at bedtime.   Yes [provider]  Multiple Vitamin (MULITIVITAMIN WITH MINERALS) TABS Take 1 tablet by mouth at bedtime.   Yes [provider]  naproxen sodium (ANAPROX) 220 MG tablet Take 220 mg by mouth 2 (two) times daily as needed (pain).    Yes [provider]  silodosin (RAPAFLO) 8 MG CAPS capsule Take 8 mg by mouth every evening.    Yes [provider]  tamsulosin (FLOMAX) 0.4 MG CAPS capsule Take 0.4 mg by mouth at bedtime. 08/31/17  Yes [provider]  temazepam (RESTORIL) 30 MG capsule Take 30 mg by mouth at bedtime as needed for sleep.  05/04/17  Yes [provider]  traZODone (DESYREL) 50 MG tablet Take 50 mg by mouth at bedtime as needed for sleep.  06/18/17  Yes [provider]  vitamin B-12 (CYANOCOBALAMIN) 1000 MCG tablet Take 1,000 mcg by mouth daily.   Yes [provider]  zolpidem (AMBIEN) 5 MG tablet Take 5 mg by mouth at bedtime as needed for sleep.  07/03/17  Yes [provider]  calcium citrate-vitamin D (CITRACAL+D) 315-200 MG-UNIT per tablet Take 1 tablet by mouth daily.  09/04/11  [provider]  cetirizine (ZYRTEC) 10 MG tablet Take 10 mg by mouth daily.  09/04/11  [provider]  insulin lispro (HUMALOG) 100 UNIT/ML injection Inject 100 Units into the skin daily as needed.  09/04/11  [provider]  terazosin (HYTRIN) 5 MG capsule Take 5 mg by mouth at bedtime.  09/04/11  [provider]     Allergies:     Allergies  Allergen Reactions  . Bee Venom Other (See Comments)    Bump where stung and took days to go away  . Glucophage [Metformin] Diarrhea    "i will never take it again -stool incontinent "  .  Shellfish Allergy Swelling     Physical Exam:    Vitals  Blood pressure 128/62, pulse 85, temperature 98.3 F (36.8 C), temperature source Oral, resp. rate 20, SpO2 100 %.   1. General  lying in bed in NAD,   2. Normal affect and insight, Not Suicidal or Homicidal, Awake Alert, Oriented X 3.  3. No F.N deficits, ALL C.Nerves Intact, Strength 5/5 all 4 extremities, Sensation intact all 4 extremities, Plantars down going.  4. Ears and Eyes appear Normal, Conjunctivae clear, PERRLA. Moist Oral Mucosa.  5. Supple Neck, No JVD, No cervical lymphadenopathy appriciated, No Carotid Bruits.  6. Symmetrical Chest wall movement, Good air movement bilaterally, CTAB.  7. RRR, No Gallops, Rubs or Murmurs, No Parasternal Heave.  8. Positive Bowel Sounds, Abdomen Soft, No tenderness, No organomegaly appriciated,No rebound -guarding or rigidity.  9.  No Cyanosis, Normal Skin Turgor, No Skin Rash or Bruise.  10. Good muscle tone,  joints appear normal , no effusions, Normal ROM.  11. No Palpable Lymph Nodes in Neck or Axillae   No cva tenderness    Data Review:    CBC Recent Labs  Lab 08/31/17 0620 09/06/17 1626  WBC 9.7 5.7  HGB 12.7* 14.8  HCT 38.2* 42.6  PLT 184 218  MCV 92.0 90.3  MCH 30.6 31.4  MCHC 33.2 34.7  RDW 14.2 13.8  LYMPHSABS 5.4* 0.9  MONOABS 0.4 0.5  EOSABS 0.2 0.1  BASOSABS 0.0 0.0   ------------------------------------------------------------------------------------------------------------------  Chemistries  Recent Labs  Lab 08/31/17 0620 09/06/17 1626  NA 135 136  K 4.0 3.7  CL 104 100*  CO2 24 25  GLUCOSE 302* 181*  BUN 32* 22*  CREATININE 0.66 0.78  CALCIUM 9.3 9.7  AST  --  28  ALT  --  28  ALKPHOS  --  84  BILITOT  --  1.4*   ------------------------------------------------------------------------------------------------------------------ estimated creatinine clearance is 66 mL/min (by C-G formula based on SCr of 0.78  mg/dL). ------------------------------------------------------------------------------------------------------------------ No results for input(s): TSH, T4TOTAL, T3FREE, THYROIDAB in the last 72 hours.  Invalid input(s): FREET3  Coagulation profile No results for input(s): INR, PROTIME in the last 168 hours. ------------------------------------------------------------------------------------------------------------------- No results for input(s): DDIMER in the last 72 hours. -------------------------------------------------------------------------------------------------------------------  Cardiac Enzymes No results for input(s): CKMB, TROPONINI, MYOGLOBIN in the last 168 hours.  Invalid input(s): CK ------------------------------------------------------------------------------------------------------------------ No results found for: BNP   ---------------------------------------------------------------------------------------------------------------  Urinalysis    Component Value Date/Time   COLORURINE YELLOW 09/06/2017 1937   APPEARANCEUR CLEAR 09/06/2017 1937   LABSPEC 1.005 09/06/2017 1937   PHURINE 8.0 09/06/2017 1937   GLUCOSEU NEGATIVE 09/06/2017 1937   HGBUR SMALL (A) 09/06/2017 1937   BILIRUBINUR NEGATIVE 09/06/2017 1937   KETONESUR 5 (A) 09/06/2017 1937   PROTEINUR NEGATIVE 09/06/2017 1937   NITRITE NEGATIVE 09/06/2017 1937   LEUKOCYTESUR LARGE (A) 09/06/2017 1937    ----------------------------------------------------------------------------------------------------------------   Imaging Results:    No results found.     Assessment & Plan:    Principal Problem:   UTI (urinary tract infection) Active Problems:   Hyperglycemia    AMS likely secondary to UTI Check b12, folate, esr, ana, rpr.  Check CT brain  UTI Await urine culture Rocephin 1gm iv qday  Bph Cont Flomax  Hyperlipidemia Mevacor=> Pravastatin 71m po qhs  Dm2 fsbs ac and qhs,  ISS  Anxiety Cont Wellbutrin, Trazodone    DVT Prophylaxis Lovenox - SCDs   AM Labs Ordered, also please review Full Orders  Family Communication: Admission, patients  condition and plan of care including tests being ordered have been discussed with the patient who indicate understanding and agree with the plan and Code Status.  Code Status FULL CODE  Likely DC to  home  Condition GUARDED    Consults called: none  Admission status: inpatient   Time spent in minutes : 45   Jani Gravel M.D on 09/06/2017 at 8:55 PM  Between 7am to 7pm - Pager - [phone] . After 7pm go to [email or web address] - password [email or web address]  [facility] - Office  [phone]

## 2017-09-06 NOTE — ED Notes (Signed)
Upon entering patients room he was standing by the sink with urinal. Patient proceeded to urinate in urinal and then put the urinal under the sink and fill with water then urinate again. Patient was asked not to do that again because a urine sample is needed.

## 2017-09-07 ENCOUNTER — Inpatient Hospital Stay (HOSPITAL_COMMUNITY): Payer: Medicare Other

## 2017-09-07 LAB — CBC
HEMATOCRIT: 38.8 % — AB (ref 39.0–52.0)
Hemoglobin: 12.7 g/dL — ABNORMAL LOW (ref 13.0–17.0)
MCH: 30.1 pg (ref 26.0–34.0)
MCHC: 32.7 g/dL (ref 30.0–36.0)
MCV: 91.9 fL (ref 78.0–100.0)
Platelets: 191 10*3/uL (ref 150–400)
RBC: 4.22 MIL/uL (ref 4.22–5.81)
RDW: 14.4 % (ref 11.5–15.5)
WBC: 9.5 10*3/uL (ref 4.0–10.5)

## 2017-09-07 LAB — COMPREHENSIVE METABOLIC PANEL
ALBUMIN: 3.5 g/dL (ref 3.5–5.0)
ALT: 22 U/L (ref 17–63)
AST: 20 U/L (ref 15–41)
Alkaline Phosphatase: 70 U/L (ref 38–126)
Anion gap: 7 (ref 5–15)
BUN: 17 mg/dL (ref 6–20)
CHLORIDE: 107 mmol/L (ref 101–111)
CO2: 24 mmol/L (ref 22–32)
Calcium: 9 mg/dL (ref 8.9–10.3)
Creatinine, Ser: 0.79 mg/dL (ref 0.61–1.24)
GFR calc Af Amer: >60 mL/min (ref >60)
GFR calc non Af Amer: >60 mL/min (ref >60)
GLUCOSE: 271 mg/dL — AB (ref 65–99)
POTASSIUM: 4.1 mmol/L (ref 3.5–5.1)
SODIUM: 138 mmol/L (ref 135–145)
Total Bilirubin: 1 mg/dL (ref 0.3–1.2)
Total Protein: 6 g/dL — ABNORMAL LOW (ref 6.5–8.1)

## 2017-09-07 LAB — VITAMIN B12: Vitamin B-12: 382 pg/mL (ref 180–914)

## 2017-09-07 LAB — GLUCOSE, CAPILLARY
GLUCOSE-CAPILLARY: 149 mg/dL — AB (ref 65–99)
Glucose-Capillary: 186 mg/dL — ABNORMAL HIGH (ref 65–99)
Glucose-Capillary: 327 mg/dL — ABNORMAL HIGH (ref 65–99)
Glucose-Capillary: 214 mg/dL — ABNORMAL HIGH (ref 65–99)

## 2017-09-07 LAB — SEDIMENTATION RATE: Sed Rate: 5 mm/hr (ref 0–16)

## 2017-09-07 LAB — RPR: RPR: NONREACTIVE

## 2017-09-07 NOTE — Progress Notes (Signed)
Writer assumed care of this patient at 1300.  I agree with the previous RN's assessment.  PT resting in bed comfortably, call bell within reach, no current needs.

## 2017-09-07 NOTE — Progress Notes (Signed)
PROGRESS NOTE    Hayden [name]  JOA:[id number] DOB: Aug 27, 1940 DOA: 09/06/2017 PCP: Juanell Fairly, MD (Inactive)    Brief Narrative: Hayden [name]  is a 77 y.o. male, w hyperlipidemia, dm2, CLL,  Depression presents with intermittent confusion per home health aide per ED. Pt is alert but very confused, suspect he has mod dementia. He was also found to have UTI.     Assessment & Plan:   Principal Problem:   UTI (urinary tract infection) Active Problems:   Hyperglycemia   ARF (acute renal failure) (HCC)   Altered mental status   Acute encephalopathy: Suspect possibly from both UTI and worsening of underlying dementia>  Currently getting treatment for UTI.  Ct HEAD neg for acute stroke .  Will need MRI brain for dementia work up , including TSH , vit b12 levels.    UTI:  Cultures pending.  On rocephin.   type2 DM: CBG (last 3)  Recent Labs    09/07/17 0752 09/07/17 1212 09/07/17 1730  GLUCAP 186* 327* 149*   Resume SSI.    DVT prophylaxis: lovenox.  Code Status: full code.  Family Communication: none at bedside.  Disposition Plan: pending MRI brain, possible home after PT eval.    Consultants:   None.   Procedures: none.   Antimicrobials: rocephin  Subjective: Confused, wants to take a bath.   Objective: Vitals:   09/06/17 2038 09/06/17 2211 09/07/17 0534 09/07/17 1400  BP: 128/62 132/81 136/71 131/80  Pulse: 85 78 87 88  Resp: 20 18 16 16   Temp:  (!) 97.5 F (36.4 C) 98.7 F (37.1 C) 98.7 F (37.1 C)  TempSrc:  Oral Oral Oral  SpO2: 100% 100% 98% 98%  Weight:  59.9 kg (132 lb 1.6 oz) 59.1 kg (130 lb 6.4 oz)   Height:  5' 8.5" (1.74 m)      Intake/Output Summary (Last 24 hours) at 09/07/2017 1839 Last data filed at 09/07/2017 1300 Gross per 24 hour  Intake 1767.5 ml  Output 200 ml  Net 1567.5 ml   Filed Weights   09/06/17 2211 09/07/17 0534  Weight: 59.9 kg (132 lb 1.6 oz) 59.1 kg (130 lb 6.4 oz)    Examination:  General exam:  Appears calm and comfortable  Respiratory system: Clear to auscultation. Respiratory effort normal. Cardiovascular system: S1 & S2 heard, RRR. No JVD, murmurs, rubs, gallops or clicks. No pedal edema. Gastrointestinal system: Abdomen is nondistended, soft and nontender. No organomegaly or masses felt. Normal bowel sounds heard. Central nervous system: Alert and oriented. No focal neurological deficits. Extremities: Symmetric 5 x 5 power. Skin: No rashes, lesions or ulcers Psychiatry: Judgement and insight appear normal. Mood & affect appropriate.     Data Reviewed: I have personally reviewed following labs and imaging studies  CBC: Recent Labs  Lab 09/06/17 1626 09/07/17 0450  WBC 5.7 9.5  NEUTROABS 4.2  --   HGB 14.8 12.7*  HCT 42.6 38.8*  MCV 90.3 91.9  PLT 218 601   Basic Metabolic Panel: Recent Labs  Lab 09/06/17 1626 09/07/17 0450  NA 136 138  K 3.7 4.1  CL 100* 107  CO2 25 24  GLUCOSE 181* 271*  BUN 22* 17  CREATININE 0.78 0.79  CALCIUM 9.7 9.0   GFR: Estimated Creatinine Clearance: 64.6 mL/min (by C-G formula based on SCr of 0.79 mg/dL). Liver Function Tests: Recent Labs  Lab 09/06/17 1626 09/07/17 0450  AST 28 20  ALT 28 22  ALKPHOS 84 70  BILITOT 1.4* 1.0  PROT 7.5 6.0*  ALBUMIN 4.6 3.5   No results for input(s): LIPASE, AMYLASE in the last 168 hours. No results for input(s): AMMONIA in the last 168 hours. Coagulation Profile: No results for input(s): INR, PROTIME in the last 168 hours. Cardiac Enzymes: No results for input(s): CKTOTAL, CKMB, CKMBINDEX, TROPONINI in the last 168 hours. BNP (last 3 results) No results for input(s): PROBNP in the last 8760 hours. HbA1C: No results for input(s): HGBA1C in the last 72 hours. CBG: Recent Labs  Lab 09/06/17 2218 09/07/17 0752 09/07/17 1212 09/07/17 1730  GLUCAP 168* 186* 327* 149*   Lipid Profile: No results for input(s): CHOL, HDL, LDLCALC, TRIG, CHOLHDL, LDLDIRECT in the last 72  hours. Thyroid Function Tests: No results for input(s): TSH, T4TOTAL, FREET4, T3FREE, THYROIDAB in the last 72 hours. Anemia Panel: Recent Labs    09/07/17 0450  VITAMINB12 382   Sepsis Labs: Recent Labs  Lab 09/06/17 1632 09/06/17 1841  LATICACIDVEN 1.86 1.71    Recent Results (from the past 240 hour(s))  Urine culture     Status: Abnormal   Collection Time: 08/31/17  6:20 AM  Result Value Ref Range Status   Specimen Description   Final    URINE, CATHETERIZED Performed at [facility] [address] [place], [place] [address]    Special Requests   Final    NONE Performed at [facility], [place] [address] [place], [place] [address]    Culture >=100,000 COLONIES/mL KLEBSIELLA PNEUMONIAE (A)  Final   Report Status 09/02/2017 FINAL  Final   Organism ID, Bacteria KLEBSIELLA PNEUMONIAE (A)  Final      Susceptibility   Klebsiella pneumoniae - MIC*    AMPICILLIN >=32 RESISTANT Resistant     CEFAZOLIN <=4 SENSITIVE Sensitive     CEFTRIAXONE <=1 SENSITIVE Sensitive     CIPROFLOXACIN <=0.25 SENSITIVE Sensitive     GENTAMICIN <=1 SENSITIVE Sensitive     IMIPENEM <=0.25 SENSITIVE Sensitive     NITROFURANTOIN 64 INTERMEDIATE Intermediate     TRIMETH/SULFA <=20 SENSITIVE Sensitive     AMPICILLIN/SULBACTAM 4 SENSITIVE Sensitive     PIP/TAZO <=4 SENSITIVE Sensitive     Extended ESBL NEGATIVE Sensitive     * >=100,000 COLONIES/mL KLEBSIELLA PNEUMONIAE         Radiology Studies: Ct Head Wo Contrast  Result Date: 09/07/2017 CLINICAL DATA:  Confusion.  Altered mental status. EXAM: CT HEAD WITHOUT CONTRAST TECHNIQUE: Contiguous axial images were obtained from the base of the skull through the vertex without intravenous contrast. COMPARISON:  None. FINDINGS: Brain: Age related cerebral atrophy, ventriculomegaly and periventricular white matter disease. No extra-axial fluid collections are identified. No CT findings for acute hemispheric  infarction or intracranial hemorrhage. No mass lesions. The brainstem and cerebellum are normal. Vascular: No hyperdense vessels or worrisome calcifications. Skull: No skull fracture or bone lesions. Sinuses/Orbits: The paranasal sinuses and mastoid air cells are clear. The globes are intact. Other: No scalp lesions or hematoma. IMPRESSION: Age related cerebral atrophy, ventriculomegaly and periventricular white matter disease. No acute intracranial findings or mass lesions. Electronically Signed   By: Marijo Sanes M.D.   On: 09/07/2017 10:51        Scheduled Meds: . aspirin EC  81 mg Oral QHS  . buPROPion  100 mg Oral BID  . enoxaparin (LOVENOX) injection  40 mg Subcutaneous Q24H  . insulin aspart  0-5 Units Subcutaneous QHS  . insulin aspart  0-9 Units Subcutaneous TID  WC  . insulin detemir  8 Units Subcutaneous QHS  . pravastatin  20 mg Oral q1800  . tamsulosin  0.4 mg Oral QHS  . vitamin B-12  1,000 mcg Oral Daily   Continuous Infusions: . sodium chloride 50 mL/hr at 09/07/17 1805  . cefTRIAXone (ROCEPHIN)  IV       LOS: 1 day    Time spent: 35 minutes.     Hosie Poisson, MD [facility] Pager [phone]  If 7PM-7AM, please contact night-coverage [email or web address] Password [facility] 09/07/2017, 6:39 PM

## 2017-09-08 LAB — TSH: TSH: 1.489 u[IU]/mL (ref 0.350–4.500)

## 2017-09-08 LAB — GLUCOSE, CAPILLARY
GLUCOSE-CAPILLARY: 253 mg/dL — AB (ref 65–99)
GLUCOSE-CAPILLARY: 234 mg/dL — AB (ref 65–99)
Glucose-Capillary: 192 mg/dL — ABNORMAL HIGH (ref 65–99)
Glucose-Capillary: 256 mg/dL — ABNORMAL HIGH (ref 65–99)

## 2017-09-08 NOTE — Plan of Care (Signed)
  Progressing Education: Knowledge of General Education information will improve 09/08/2017 0806 - Progressing by Ashley Murrain, RN Health Behavior/Discharge Planning: Ability to manage health-related needs will improve 09/08/2017 0806 - Progressing by Ashley Murrain, RN Clinical Measurements: Ability to maintain clinical measurements within normal limits will improve 09/08/2017 0806 - Progressing by Ashley Murrain, RN Will remain free from infection 09/08/2017 0806 - Progressing by Ashley Murrain, RN Diagnostic test results will improve 09/08/2017 0806 - Progressing by Ashley Murrain, RN Respiratory complications will improve 09/08/2017 0806 - Progressing by Ashley Murrain, RN Cardiovascular complication will be avoided 09/08/2017 0806 - Progressing by Ashley Murrain, RN Activity: Risk for activity intolerance will decrease 09/08/2017 0806 - Progressing by Ashley Murrain, RN Nutrition: Adequate nutrition will be maintained 09/08/2017 0806 - Progressing by Ashley Murrain, RN Coping: Level of anxiety will decrease 09/08/2017 0806 - Progressing by Ashley Murrain, RN Elimination: Will not experience complications related to bowel motility 09/08/2017 0806 - Progressing by Ashley Murrain, RN Will not experience complications related to urinary retention 09/08/2017 0806 - Progressing by Ashley Murrain, RN Pain Managment: General experience of comfort will improve 09/08/2017 0806 - Progressing by Ashley Murrain, RN Safety: Ability to remain free from injury will improve 09/08/2017 0806 - Progressing by Ashley Murrain, RN Skin Integrity: Risk for impaired skin integrity will decrease 09/08/2017 0806 - Progressing by Ashley Murrain, RN

## 2017-09-08 NOTE — Progress Notes (Signed)
PROGRESS NOTE    Hayden [name]  YIF:[id number] DOB: [birth year] DOA: 09/06/2017 PCP: Hayden Fairly, MD (Inactive)    Brief Narrative: Hayden [name]  is a 77 y.o. male, w hyperlipidemia, dm2, CLL,  Depression presents with intermittent confusion per home health aide per ED. Pt is alert but very confused, ruled out for an acute stroke. Symptoms likely secondary to urinary tract infection. Not safe to be discharged to his previous living condition. PT OT evaluation and social worker has been consulted.    Assessment & Plan:   Principal Problem:   UTI (urinary tract infection) Active Problems:   Hyperglycemia   ARF (acute renal failure) (HCC)   Altered mental status   Acute encephalopathy:/Vascular dementia Suspect possibly from both UTI and worsening of underlying dementia> MRI negative for acute stroke, shows Moderate chronic microvascular ischemic changes and moderate parenchymal volume loss of the brain Currently getting treatment for UTI.  Ct HEAD neg for acute stroke .  TSH within normal limits, vitamin B-12 382, will check vitamin   B1 and supplement PT OT evaluation Case management consult for placement,   Pseudomonas original site UTI greater than 100,000 colonies Follow susceptibility On rocephin.   type2 DM: CBG (last 3)  Recent Labs    09/07/17 2235 09/08/17 0821 09/08/17 1156  GLUCAP 214* 192* 253*  Apparently takes insulin 75/25 , patient is currently on Lantus/SSI need to clarify home regimen     DVT prophylaxis: lovenox.  Code Status: full code.  Family Communication: none at bedside.  Disposition Plan: Needs placement, PT/OT/SNF   Consultants:   None.   Procedures: none.   Antimicrobials: rocephin  Subjective: Confused, Patient states that he knows several people in the document and in [employer], and he can influence hospital administration to do the right thing for him  Objective: Vitals:   09/07/17 0534 09/07/17 1400 09/07/17 2232  09/08/17 0441  BP: 136/71 131/80 (!) 151/70 137/63  Pulse: 87 88 76 70  Resp: 16 16 16 16   Temp: 98.7 F (37.1 C) 98.7 F (37.1 C) 98.5 F (36.9 C) (!) 97.5 F (36.4 C)  TempSrc: Oral Oral Oral Oral  SpO2: 98% 98% 100% 99%  Weight: 59.1 kg (130 lb 6.4 oz)   61.1 kg (134 lb 9.6 oz)  Height:        Intake/Output Summary (Last 24 hours) at 09/08/2017 1348 Last data filed at 09/08/2017 0600 Gross per 24 hour  Intake 2098.34 ml  Output -  Net 2098.34 ml   Filed Weights   09/06/17 2211 09/07/17 0534 09/08/17 0441  Weight: 59.9 kg (132 lb 1.6 oz) 59.1 kg (130 lb 6.4 oz) 61.1 kg (134 lb 9.6 oz)    Examination:  General exam: Appears calm and comfortable  Respiratory system: Clear to auscultation. Respiratory effort normal. Cardiovascular system: S1 & S2 heard, RRR. No JVD, murmurs, rubs, gallops or clicks. No pedal edema. Gastrointestinal system: Abdomen is nondistended, soft and nontender. No organomegaly or masses felt. Normal bowel sounds heard. Central nervous system: Alert and oriented. No focal neurological deficits. Extremities: Symmetric 5 x 5 power. Skin: No rashes, lesions or ulcers Psychiatry: Judgement and insight appear normal. Mood & affect appropriate.     Data Reviewed: I have personally reviewed following labs and imaging studies  CBC: Recent Labs  Lab 09/06/17 1626 09/07/17 0450  WBC 5.7 9.5  NEUTROABS 4.2  --   HGB 14.8 12.7*  HCT 42.6 38.8*  MCV 90.3 91.9  PLT 218 191  Basic Metabolic Panel: Recent Labs  Lab 09/06/17 1626 09/07/17 0450  NA 136 138  K 3.7 4.1  CL 100* 107  CO2 25 24  GLUCOSE 181* 271*  BUN 22* 17  CREATININE 0.78 0.79  CALCIUM 9.7 9.0   GFR: Estimated Creatinine Clearance: 66.8 mL/min (by C-G formula based on SCr of 0.79 mg/dL). Liver Function Tests: Recent Labs  Lab 09/06/17 1626 09/07/17 0450  AST 28 20  ALT 28 22  ALKPHOS 84 70  BILITOT 1.4* 1.0  PROT 7.5 6.0*  ALBUMIN 4.6 3.5   No results for input(s):  LIPASE, AMYLASE in the last 168 hours. No results for input(s): AMMONIA in the last 168 hours. Coagulation Profile: No results for input(s): INR, PROTIME in the last 168 hours. Cardiac Enzymes: No results for input(s): CKTOTAL, CKMB, CKMBINDEX, TROPONINI in the last 168 hours. BNP (last 3 results) No results for input(s): PROBNP in the last 8760 hours. HbA1C: No results for input(s): HGBA1C in the last 72 hours. CBG: Recent Labs  Lab 09/07/17 1212 09/07/17 1730 09/07/17 2235 09/08/17 0821 09/08/17 1156  GLUCAP 327* 149* 214* 192* 253*   Lipid Profile: No results for input(s): CHOL, HDL, LDLCALC, TRIG, CHOLHDL, LDLDIRECT in the last 72 hours. Thyroid Function Tests: Recent Labs    09/08/17 0442  TSH 1.489   Anemia Panel: Recent Labs    09/07/17 0450  VITAMINB12 382   Sepsis Labs: Recent Labs  Lab 09/06/17 1632 09/06/17 1841  LATICACIDVEN 1.86 1.71    Recent Results (from the past 240 hour(s))  Urine culture     Status: Abnormal   Collection Time: 08/31/17  6:20 AM  Result Value Ref Range Status   Specimen Description   Final    URINE, CATHETERIZED Performed at [facility] [address], [place], [place] [address]    Special Requests   Final    NONE Performed at [facility], [place] [address] [place], [place] [address]    Culture >=100,000 COLONIES/mL KLEBSIELLA PNEUMONIAE (A)  Final   Report Status 09/02/2017 FINAL  Final   Organism ID, Bacteria KLEBSIELLA PNEUMONIAE (A)  Final      Susceptibility   Klebsiella pneumoniae - MIC*    AMPICILLIN >=32 RESISTANT Resistant     CEFAZOLIN <=4 SENSITIVE Sensitive     CEFTRIAXONE <=1 SENSITIVE Sensitive     CIPROFLOXACIN <=0.25 SENSITIVE Sensitive     GENTAMICIN <=1 SENSITIVE Sensitive     IMIPENEM <=0.25 SENSITIVE Sensitive     NITROFURANTOIN 64 INTERMEDIATE Intermediate     TRIMETH/SULFA <=20 SENSITIVE Sensitive     AMPICILLIN/SULBACTAM 4 SENSITIVE Sensitive      PIP/TAZO <=4 SENSITIVE Sensitive     Extended ESBL NEGATIVE Sensitive     * >=100,000 COLONIES/mL KLEBSIELLA PNEUMONIAE  Urine culture     Status: Abnormal (Preliminary result)   Collection Time: 09/06/17  8:01 PM  Result Value Ref Range Status   Specimen Description   Final    URINE, RANDOM Performed at [facility] [address], [place], [place] [address]    Special Requests   Final    NONE Performed at [facility], [place] [address] [place], [place] [address]    Culture (A)  Final    >=100,000 COLONIES/mL PSEUDOMONAS AERUGINOSA SUSCEPTIBILITIES TO FOLLOW Performed at [facility], [place] [address], [place],  [address]    Report Status PENDING  Incomplete         Radiology Studies:  Ct Head Wo Contrast  Result Date: 09/07/2017 CLINICAL DATA:  Confusion.  Altered mental status. EXAM: CT HEAD WITHOUT CONTRAST TECHNIQUE: Contiguous axial images were obtained from the base of the skull through the vertex without intravenous contrast. COMPARISON:  None. FINDINGS: Brain: Age related cerebral atrophy, ventriculomegaly and periventricular white matter disease. No extra-axial fluid collections are identified. No CT findings for acute hemispheric infarction or intracranial hemorrhage. No mass lesions. The brainstem and cerebellum are normal. Vascular: No hyperdense vessels or worrisome calcifications. Skull: No skull fracture or bone lesions. Sinuses/Orbits: The paranasal sinuses and mastoid air cells are clear. The globes are intact. Other: No scalp lesions or hematoma. IMPRESSION: Age related cerebral atrophy, ventriculomegaly and periventricular white matter disease. No acute intracranial findings or mass lesions. Electronically Signed   By: Marijo Sanes M.D.   On: 09/07/2017 10:51   Mr Brain Wo Contrast  Result Date: 09/07/2017 CLINICAL DATA:  77 y/o  M; acute onset of confusion. EXAM: MRI HEAD WITHOUT CONTRAST TECHNIQUE: Multiplanar,  multiecho pulse sequences of the brain and surrounding structures were obtained without intravenous contrast. COMPARISON:  09/07/2017 CT of the head FINDINGS: Brain: No acute infarction, hemorrhage, hydrocephalus, extra-axial collection or mass lesion. Numerousnonspecific foci of T2 FLAIR hyperintense signal abnormality in subcortical and periventricular white matter are compatible withmoderatechronic microvascular ischemic changes for age. Moderatebrain parenchymal volume loss. Increased signal and decreased size in the left posterior hippocampus (series 10, image 13). Vascular: Normal flow voids. Skull and upper cervical spine: Normal marrow signal. Sinuses/Orbits: Mild paranasal sinus mucosal thickening. No significant abnormal signal of mastoid air cells. Orbits are unremarkable. Other: None. IMPRESSION: 1. Increased signal and decreased size of left posterior hippocampus may be related to prior infection, inflammation, or ischemia. If patient has history of seizures, mesial temporal sclerosis is possible. 2. Moderate chronic microvascular ischemic changes and moderate parenchymal volume loss of the brain. 3. No acute stroke, hemorrhage, or focal mass effect. Electronically Signed   By: Kristine Garbe M.D.   On: 09/07/2017 22:10        Scheduled Meds: . aspirin EC  81 mg Oral QHS  . buPROPion  100 mg Oral BID  . enoxaparin (LOVENOX) injection  40 mg Subcutaneous Q24H  . insulin aspart  0-5 Units Subcutaneous QHS  . insulin aspart  0-9 Units Subcutaneous TID WC  . insulin detemir  8 Units Subcutaneous QHS  . pravastatin  20 mg Oral q1800  . tamsulosin  0.4 mg Oral QHS  . vitamin B-12  1,000 mcg Oral Daily   Continuous Infusions: . sodium chloride 75 mL/hr at 09/08/17 1256  . cefTRIAXone (ROCEPHIN)  IV Stopped (09/07/17 2350)     LOS: 2 days    Time spent: 35 minutes.     Reyne Dumas, MD [facility] Pager [phone]  If 7PM-7AM, please contact  night-coverage [email or web address] Password [email or web address] 09/08/2017, 1:48 PM

## 2017-09-08 NOTE — Evaluation (Signed)
Physical Therapy Evaluation Patient Details Name: Hayden [name] MRN: [id number] DOB: September 26, 1940 Today's Date: 09/08/2017   History of Present Illness  77 yo male admitted with UTI. Hx of dementia, falls. Pt is from home alone.   Clinical Impression  On eval, pt required Min assist for mobility. He walked ~500 feet around unit. LOB x 3 during ambulation. Pt is confused. No family present during session. At this juncture, do not feel pt is safe to d/c home alone. He demonstrates poor safety awareness. He is at high risk for falls when ambulating. Recommend SNF. Will follow during hospital stay.     Follow Up Recommendations SNF(if pt returns home would recommend 24 hour supervision)    Equipment Recommendations  None recommended by PT    Recommendations for Other Services       Precautions / Restrictions Precautions Precautions: Fall Restrictions Weight Bearing Restrictions: No      Mobility  Bed Mobility Overal bed mobility: Needs Assistance Bed Mobility: Supine to Sit;Sit to Supine     Supine to sit: Supervision Sit to supine: Supervision   General bed mobility comments: for safety, lines  Transfers Overall transfer level: Needs assistance   Transfers: Sit to/from Stand Sit to Stand: Min guard         General transfer comment: close guard for safety.   Ambulation/Gait Ambulation/Gait assistance: Min assist Ambulation Distance (Feet): 500 Feet Assistive device: None Gait Pattern/deviations: Drifts right/left;Staggering right;Staggering left;Step-through pattern     General Gait Details: LOB x 3 during ambulation. Slow balance reactions. External assist from therapist required to prevent fall. High risk for falls.   Stairs            Wheelchair Mobility    Modified Rankin (Stroke Patients Only)       Balance Overall balance assessment: Needs assistance           Standing balance-Leahy Scale: Fair Standing balance comment: Had pt perform  gait with head turns, 360 degree turns, stop-n-go tasks. Unsteady/LOB with head turns while walking                             Pertinent Vitals/Pain Pain Assessment: No/denies pain    Home Living Family/patient expects to be discharged to:: Private residence Living Arrangements: Alone   Type of Home: House Home Access: Stairs to enter Entrance Stairs-Rails: Right Entrance Stairs-Number of Steps: 3 Home Layout: One level Home Equipment: Environmental consultant - 2 wheels      Prior Function Level of Independence: Independent               Hand Dominance        Extremity/Trunk Assessment   Upper Extremity Assessment Upper Extremity Assessment: Overall WFL for tasks assessed    Lower Extremity Assessment Lower Extremity Assessment: Generalized weakness    Cervical / Trunk Assessment Cervical / Trunk Assessment: Normal  Communication   Communication: No difficulties  Cognition Arousal/Alertness: Awake/alert Behavior During Therapy: WFL for tasks assessed/performed Overall Cognitive Status: History of cognitive impairments - at baseline                                 General Comments: pt perseverated on telephone system and ability to use his political and [place] connections to aid in his care at hospital. Can be redirected.       General Comments  Exercises     Assessment/Plan    PT Assessment Patient needs continued PT services  PT Problem List Decreased balance;Decreased mobility;Decreased cognition       PT Treatment Interventions Gait training;Functional mobility training;DME instruction;Balance training;Patient/family education;Therapeutic activities;Therapeutic exercise    PT Goals (Current goals can be found in the Care Plan section)  Acute Rehab PT Goals Patient Stated Goal: none stated PT Goal Formulation: With patient Time For Goal Achievement: 09/22/17    Frequency Min 3X/week   Barriers to discharge         Co-evaluation               AM-PAC PT "6 Clicks" Daily Activity  Outcome Measure Difficulty turning over in bed (including adjusting bedclothes, sheets and blankets)?: None Difficulty moving from lying on back to sitting on the side of the bed? : None Difficulty sitting down on and standing up from a chair with arms (e.g., wheelchair, bedside commode, etc,.)?: A Little Help needed moving to and from a bed to chair (including a wheelchair)?: A Little Help needed walking in hospital room?: A Little Help needed climbing 3-5 steps with a railing? : A Little 6 Click Score: 20    End of Session Equipment Utilized During Treatment: Gait belt Activity Tolerance: Patient tolerated treatment well Patient left: in bed;with call bell/phone within reach;with bed alarm set   PT Visit Diagnosis: Difficulty in walking, not elsewhere classified (R26.2);History of falling (Z91.81);Repeated falls (R29.6);Unsteadiness on feet (R26.81)    Time: 0071-2197 PT Time Calculation (min) (ACUTE ONLY): 31 min   Charges:   PT Evaluation $PT Eval Moderate Complexity: 1 Mod PT Treatments $Gait Training: 8-22 mins   PT G Codes:          Weston Anna, MPT Pager: [phone]

## 2017-09-09 DIAGNOSIS — G9340 Encephalopathy, unspecified: Secondary | ICD-10-CM

## 2017-09-09 DIAGNOSIS — N3000 Acute cystitis without hematuria: Principal | ICD-10-CM

## 2017-09-09 DIAGNOSIS — R4182 Altered mental status, unspecified: Secondary | ICD-10-CM

## 2017-09-09 DIAGNOSIS — N10 Acute tubulo-interstitial nephritis: Secondary | ICD-10-CM

## 2017-09-09 DIAGNOSIS — R739 Hyperglycemia, unspecified: Secondary | ICD-10-CM

## 2017-09-09 LAB — COMPREHENSIVE METABOLIC PANEL
ALBUMIN: 3.5 g/dL (ref 3.5–5.0)
ALK PHOS: 61 U/L (ref 38–126)
ALT: 18 U/L (ref 17–63)
ANION GAP: 8 (ref 5–15)
AST: 17 U/L (ref 15–41)
BILIRUBIN TOTAL: 0.9 mg/dL (ref 0.3–1.2)
BUN: 17 mg/dL (ref 6–20)
CALCIUM: 8.9 mg/dL (ref 8.9–10.3)
CO2: 25 mmol/L (ref 22–32)
CREATININE: 0.74 mg/dL (ref 0.61–1.24)
Chloride: 103 mmol/L (ref 101–111)
GFR calc Af Amer: >60 mL/min (ref >60)
GFR calc non Af Amer: >60 mL/min (ref >60)
GLUCOSE: 235 mg/dL — AB (ref 65–99)
Potassium: 4 mmol/L (ref 3.5–5.1)
Sodium: 136 mmol/L (ref 135–145)
TOTAL PROTEIN: 6 g/dL — AB (ref 6.5–8.1)

## 2017-09-09 LAB — FOLATE RBC
FOLATE, RBC: 1285 ng/mL (ref >498)
Folate, Hemolysate: 502.4 ng/mL
Hematocrit: 39.1 % (ref 37.5–51.0)

## 2017-09-09 LAB — CBC WITH DIFFERENTIAL/PLATELET
Basophils Absolute: 0 10*3/uL (ref 0.0–0.1)
Basophils Relative: 0 %
Eosinophils Absolute: 0.1 10*3/uL (ref 0.0–0.7)
Eosinophils Relative: 1 %
HCT: 38.9 % — ABNORMAL LOW (ref 39.0–52.0)
HEMOGLOBIN: 13 g/dL (ref 13.0–17.0)
LYMPHS ABS: 5.1 10*3/uL — AB (ref 0.7–4.0)
Lymphocytes Relative: 63 %
MCH: 31 pg (ref 26.0–34.0)
MCHC: 33.4 g/dL (ref 30.0–36.0)
MCV: 92.6 fL (ref 78.0–100.0)
MONOS PCT: 4 %
Monocytes Absolute: 0.3 10*3/uL (ref 0.1–1.0)
NEUTROS ABS: 2.6 10*3/uL (ref 1.7–7.7)
NEUTROS PCT: 32 %
Platelets: 175 10*3/uL (ref 150–400)
RBC: 4.2 MIL/uL — ABNORMAL LOW (ref 4.22–5.81)
RDW: 14.1 % (ref 11.5–15.5)
WBC: 8.1 10*3/uL (ref 4.0–10.5)

## 2017-09-09 LAB — URINE CULTURE: Culture: >=100000 — AB

## 2017-09-09 LAB — GLUCOSE, CAPILLARY
GLUCOSE-CAPILLARY: 234 mg/dL — AB (ref 65–99)
Glucose-Capillary: 295 mg/dL — ABNORMAL HIGH (ref 65–99)
Glucose-Capillary: 254 mg/dL — ABNORMAL HIGH (ref 65–99)

## 2017-09-09 LAB — MAGNESIUM: Magnesium: 1.6 mg/dL — ABNORMAL LOW (ref 1.7–2.4)

## 2017-09-09 LAB — PHOSPHORUS: Phosphorus: 2.6 mg/dL (ref 2.5–4.6)

## 2017-09-09 MED ORDER — MAGNESIUM SULFATE 2 GM/50ML IV SOLN
2.0000 g | Freq: Once | INTRAVENOUS | Status: AC
  Administered 2017-09-09: 2 g via INTRAVENOUS
  Filled 2017-09-09: qty 50

## 2017-09-09 MED ORDER — CEFEPIME HCL 1 G IJ SOLR
1.0000 g | Freq: Three times a day (TID) | INTRAMUSCULAR | Status: AC
  Administered 2017-09-09 – 2017-09-10 (×4): 1 g via INTRAVENOUS
  Filled 2017-09-09 (×4): qty 1

## 2017-09-09 NOTE — Care Management Important Message (Signed)
Important Message  Patient Details  Name: Hayden [name] MRN: [id number] Date of Birth: 1941-03-14   Medicare Important Message Given:  Yes    Kerin Salen 09/09/2017, 11:45 AMImportant Message  Patient Details  Name: Hayden [name] MRN: [id number] Date of Birth: [birth year]   Medicare Important Message Given:  Yes    Kerin Salen 09/09/2017, 11:45 AM

## 2017-09-09 NOTE — Progress Notes (Signed)
PROGRESS NOTE    Hayden [name]  GDJ:[id number] DOB: 1941/05/16 DOA: 09/06/2017 PCP: Juanell Fairly, MD (Inactive)   Brief Narrative:  Hayden [name] a76 y.o.male,w hyperlipidemia, DM2,CLL, Depression and other comorbidities who presents with intermittent confusion per home health aide per ED. Pt was alert but very confused on admission, ruled out for an acute stroke. Symptoms likely secondary to urinary tract infection. Not safe to be discharged to his previous living condition. PT OT evaluation and social worker has been consulted and recommending SNF. Abx changed from IV Ceftriaxone to IV Cefepime given Pseudomonal UTI.   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 No acute intracranial findings or mass lesions -Currently getting treatment for UTI as below  -TSH within normal limits, vitamin B-12 382; C/w 1000 mcg po Vitamin B12 Daily  -Will check vitamin B1 and supplement -Delirium Precautions  -C/w ASA 81 mg and with Pravastatin 20 mg po Daily  -PT/OT evaluation recommending SNF -Social Work Consulted for Placement  Pseudomonas Aeruginosa UTI  -Showed>100,000 CFU of Pseudomonas  -Was on Rocephin but changed to IV Cefepime given Sensitivities and Susceptibilities  -Recently had Klebsiella Pneumoniae UTI last week .   Diabetes Mellitus Type 2 -Apparently takes insulin 75/25 -Will need to Clarify Home Regimen -C/w Levemir 8 units sq Daily and Sensitive Novolog SSI -CBG's ranging  from 192-256  Hypomagnesemia -Patient's Mag Level was 1.6 -Replete with IV Mag Sulfate 2 grams -Continue to Monitor and Replete as Necessary -Repeat Mag Level in AM  HLD -C/w Pravastatin 20 mg po qHS  BPH -C/w Tamsulosin 0.4 mg po qHS  Depression -C/w Wellbutrin 100 mg po BID  Hx of CLL -Diagnosed in 2008 -Stable. WBC was 8.1 -Follow up as an outpatient   DVT prophylaxis: Enoxaparin 40 mg sq q24h Code Status: FULL CODE Family Communication: No family present at bedside  Disposition Plan: SNF when Medically Stable to D/C  Consultants:   None  Procedures: None  Antimicrobials:  Anti-infectives (From admission, onward)   Start     Dose/Rate Route Frequency Ordered Stop   09/09/17 1000  ceFEPIme (MAXIPIME) 1 g in sodium chloride 0.9 % 100 mL IVPB     1 g 200 mL/hr over 30 Minutes Intravenous Every 8 hours 09/09/17 0806     09/07/17 2100  cefTRIAXone (ROCEPHIN) 1 g in sodium chloride 0.9 % 100 mL IVPB  Status:  Discontinued     1 g 200 mL/hr over 30 Minutes Intravenous Every 24 hours 09/06/17 2211 09/09/17 0748   09/06/17 2045  cefTRIAXone (ROCEPHIN) 1 g in sodium chloride 0.9 % 100 mL IVPB     1 g 200 mL/hr over 30 Minutes Intravenous  Once 09/06/17 2033 09/06/17 2149     Subjective: Seen and examined and appreciative of care. No CP or SOB. No Burning or discomfort but did complain of urinary hesitancy. No other complaints but nursing states he was combative this AM.   Objective: Vitals:   09/08/17 1500 09/08/17 2113 09/09/17 0500 09/09/17 0555  BP: 121/69 (!) 147/75  118/63  Pulse: 74 68  73  Resp: 18 18  16   Temp: 97.7 F (36.5 C) 98.7 F (37.1 C)  97.6 F (36.4 C)  TempSrc: Oral Oral  Oral  SpO2: 99% 100%  100%  Weight:   66.8 kg (147 lb 4.8 oz)   Height:        Intake/Output Summary (Last 24 hours) at 09/09/2017 1340 Last data filed at 09/09/2017 1022 Gross per 24 hour  Intake 1846.67 ml  Output 1250 ml  Net 596.67 ml   Filed Weights   09/07/17  0534 09/08/17 0441 09/09/17 0500  Weight: 59.1 kg (130 lb 6.4 oz) 61.1 kg (134 lb 9.6 oz) 66.8 kg (147 lb 4.8 oz)   Examination: Physical Exam:  Constitutional: WN/WD Caucasian male in NAD and appears calm Eyes: Lids and conjunctivae normal, sclerae anicteric  ENMT: External Ears, Nose appear normal. Grossly normal hearing. Mucous membranes are moist.  Neck: Appears normal, supple, no cervical masses, normal ROM, no appreciable thyromegaly, no JVD Respiratory: Diminished to auscultation bilaterally, no wheezing, rales, rhonchi or crackles. Normal respiratory effort and patient is not tachypenic. Cardiovascular: RRR, no murmurs / rubs / gallops. S1 and S2 auscultated. No extremity edema.  Abdomen: Soft, non-tender, non-distended. No masses palpated. No appreciable hepatosplenomegaly. Bowel sounds positive x4.  GU: Deferred. Musculoskeletal: No clubbing / cyanosis of digits/nails. No joint deformity upper and lower extremitiess  Skin: No rashes, lesions, ulcers on a limited skin eval. No induration; Warm and dry.  Neurologic: CN 2-12 grossly intact with no focal deficits. Romberg sign and cerebellar reflexes not assessed.  Psychiatric: Normal judgment and insight. Alert and oriented x 3. Normal mood and appropriate affect.   Data Reviewed: I have personally reviewed following labs and imaging studies  CBC: Recent Labs  Lab 09/06/17 1626 09/07/17 0450 09/09/17 0806  WBC 5.7 9.5 8.1  NEUTROABS 4.2  --  2.6  HGB 14.8 12.7* 13.0  HCT 42.6 38.8* 38.9*  MCV 90.3 91.9 92.6  PLT 218 191 008   Basic Metabolic Panel: Recent Labs  Lab 09/06/17 1626 09/07/17 0450 09/09/17 0806  NA 136 138 136  K 3.7 4.1 4.0  CL 100* 107 103  CO2 25 24 25   GLUCOSE 181* 271* 235*  BUN 22* 17 17  CREATININE 0.78 0.79 0.74  CALCIUM 9.7 9.0 8.9  MG  --   --  1.6*  PHOS  --   --  2.6   GFR: Estimated Creatinine Clearance: 73.1 mL/min (by C-G formula based on SCr of 0.74 mg/dL). Liver Function  Tests: Recent Labs  Lab 09/06/17 1626 09/07/17 0450 09/09/17 0806  AST 28 20 17   ALT 28 22 18   ALKPHOS 84 70 61  BILITOT 1.4* 1.0 0.9  PROT 7.5 6.0* 6.0*  ALBUMIN 4.6 3.5 3.5   No results for input(s): LIPASE, AMYLASE in the last 168 hours. No results for input(s): AMMONIA in the last 168 hours. Coagulation Profile: No results for input(s): INR, PROTIME in the last 168 hours. Cardiac Enzymes: No results for input(s): CKTOTAL, CKMB, CKMBINDEX, TROPONINI in the last 168 hours. BNP (last 3 results) No results for input(s): PROBNP in the last 8760 hours. HbA1C: No results for input(s): HGBA1C in the last 72 hours. CBG: Recent Labs  Lab 09/08/17 0821 09/08/17 1156 09/08/17 1703 09/08/17 2110 09/09/17 1152  GLUCAP 192* 253* 234* 256* 234*   Lipid Profile: No results for input(s): CHOL, HDL, LDLCALC, TRIG, CHOLHDL, LDLDIRECT in the last 72 hours. Thyroid Function Tests: Recent Labs    09/08/17  0442  TSH 1.489   Anemia Panel: Recent Labs    09/07/17 0450  VITAMINB12 382   Sepsis Labs: Recent Labs  Lab 09/06/17 1632 09/06/17 1841  LATICACIDVEN 1.86 1.71    Recent Results (from the past 240 hour(s))  Urine culture     Status: Abnormal   Collection Time: 08/31/17  6:20 AM  Result Value Ref Range Status   Specimen Description   Final    URINE, CATHETERIZED Performed at [facility] [address], [place], [place] [address]    Special Requests   Final    NONE Performed at [facility], [place] [address] [place], [place] [address]    Culture >=100,000 COLONIES/mL KLEBSIELLA PNEUMONIAE (A)  Final   Report Status 09/02/2017 FINAL  Final   Organism ID, Bacteria KLEBSIELLA PNEUMONIAE (A)  Final      Susceptibility   Klebsiella pneumoniae - MIC*    AMPICILLIN >=32 RESISTANT Resistant     CEFAZOLIN <=4 SENSITIVE Sensitive     CEFTRIAXONE <=1 SENSITIVE Sensitive     CIPROFLOXACIN <=0.25 SENSITIVE Sensitive     GENTAMICIN <=1  SENSITIVE Sensitive     IMIPENEM <=0.25 SENSITIVE Sensitive     NITROFURANTOIN 64 INTERMEDIATE Intermediate     TRIMETH/SULFA <=20 SENSITIVE Sensitive     AMPICILLIN/SULBACTAM 4 SENSITIVE Sensitive     PIP/TAZO <=4 SENSITIVE Sensitive     Extended ESBL NEGATIVE Sensitive     * >=100,000 COLONIES/mL KLEBSIELLA PNEUMONIAE  Urine culture     Status: Abnormal   Collection Time: 09/06/17  8:01 PM  Result Value Ref Range Status   Specimen Description   Final    URINE, RANDOM Performed at [facility] [address], [place], [place] [address]    Special Requests   Final    NONE Performed at [facility], [place] [address], [place], [place] [address]    Culture >=100,000 COLONIES/mL PSEUDOMONAS AERUGINOSA (A)  Final   Report Status 09/09/2017 FINAL  Final   Organism ID, Bacteria PSEUDOMONAS AERUGINOSA (A)  Final      Susceptibility   Pseudomonas aeruginosa - MIC*    CEFTAZIDIME 4 SENSITIVE Sensitive     CIPROFLOXACIN <=0.25 SENSITIVE Sensitive     GENTAMICIN <=1 SENSITIVE Sensitive     IMIPENEM 1 SENSITIVE Sensitive     PIP/TAZO 8 SENSITIVE Sensitive     CEFEPIME 4 SENSITIVE Sensitive     * >=100,000 COLONIES/mL PSEUDOMONAS AERUGINOSA    Radiology Studies: Mr Brain 4 Contrast  Result Date: 09/07/2017 CLINICAL DATA:  77 y/o  M; acute onset of confusion. EXAM: MRI HEAD WITHOUT CONTRAST TECHNIQUE: Multiplanar, multiecho pulse sequences of the brain and surrounding structures were obtained without intravenous contrast. COMPARISON:  09/07/2017 CT of the head FINDINGS: Brain: No acute infarction, hemorrhage, hydrocephalus, extra-axial collection or mass lesion. Numerousnonspecific foci of T2 FLAIR hyperintense signal abnormality in subcortical and periventricular white matter are compatible withmoderatechronic microvascular ischemic changes for age. Moderatebrain parenchymal volume loss. Increased signal and decreased size in the left posterior hippocampus  (series 10, image 13). Vascular: Normal flow voids. Skull and upper cervical spine: Normal marrow signal. Sinuses/Orbits: Mild paranasal sinus mucosal thickening. No significant abnormal signal of mastoid air cells. Orbits are unremarkable. Other: None. IMPRESSION: 1. Increased signal and decreased size of left posterior hippocampus may be related to prior infection, inflammation, or ischemia. If patient has history of seizures, mesial temporal sclerosis is possible. 2. Moderate chronic microvascular ischemic changes and moderate  parenchymal volume loss of the brain. 3. No acute stroke, hemorrhage, or focal mass effect. Electronically Signed   By: Kristine Garbe M.D.   On: 09/07/2017 22:10   Scheduled Meds: . aspirin EC  81 mg Oral QHS  . buPROPion  100 mg Oral BID  . enoxaparin (LOVENOX) injection  40 mg Subcutaneous Q24H  . insulin aspart  0-5 Units Subcutaneous QHS  . insulin aspart  0-9 Units Subcutaneous TID WC  . insulin detemir  8 Units Subcutaneous QHS  . pravastatin  20 mg Oral q1800  . tamsulosin  0.4 mg Oral QHS  . vitamin B-12  1,000 mcg Oral Daily   Continuous Infusions: . sodium chloride 75 mL/hr at 09/09/17 0149  . ceFEPime (MAXIPIME) IV Stopped (09/09/17 0940)    LOS: 3 days   Kerney Elbe, DO [facility] Pager [phone]  If 7PM-7AM, please contact night-coverage [email or web address] Password [email or web address] 09/09/2017, 1:40 PM

## 2017-09-09 NOTE — Progress Notes (Signed)
Pharmacy Antibiotic Note  Hayden [name] is a 77 y.o. male admitted on 09/06/2017 with UTI.  Pharmacy has been consulted for cefepime dosing.  Plan:  Cefepime 1 gr IV q8h    Monitor clinical course, renal function, cultures as available   Height: 5' 8.5" (174 cm) Weight: 147 lb 4.8 oz (66.8 kg) IBW/kg (Calculated) : 69.55  Temp (24hrs), Avg:98 F (36.7 C), Min:97.6 F (36.4 C), Max:98.7 F (37.1 C)  Recent Labs  Lab 09/06/17 1626 09/06/17 1632 09/06/17 1841 09/07/17 0450 09/09/17 0806  WBC 5.7  --   --  9.5 8.1  CREATININE 0.78  --   --  0.79  --   LATICACIDVEN  --  1.86 1.71  --   --     Estimated Creatinine Clearance: 73.1 mL/min (by C-G formula based on SCr of 0.79 mg/dL).    Allergies  Allergen Reactions  . Bee Venom Other (See Comments)    Bump where stung and took days to go away  . Glucophage [Metformin] Diarrhea    "i will never take it again -stool incontinent "  . Shellfish Allergy Swelling    Antimicrobials this admission: Ceftriaxone 3/11  >> 3/13 cefepime 3/13 >>   Dose adjustments this admission: ---  Microbiology results: 3/4 UCx: Klebsiella ( R to ampicillin, I to nitrofurantoin, s to rest)  3/10 UCx: Pseudomonas (pansensitive)    Thank you for allowing pharmacy to be a part of this patient's care.   Royetta Asal, PharmD, BCPS Pager [phone] 09/09/2017 8:45 AM

## 2017-09-09 NOTE — Evaluation (Signed)
Occupational Therapy Evaluation Patient Details Name: Hayden [name] MRN: [id number] DOB: 12-22-1940 Today's Date: 09/09/2017    History of Present Illness 77 yo male admitted with UTI. Hx of dementia, falls. Pt is from home alone.    Clinical Impression   Pt admitted with UTI. Pt currently with functional limitations due to the deficits listed below (see OT Problem List).  Pt will benefit from skilled OT to increase their safety and independence with ADL and functional mobility for ADL to facilitate discharge to venue listed below.   Follow Up Recommendations  SNF;Other (comment)(memory care)    Equipment Recommendations  None recommended by OT    Recommendations for Other Services       Precautions / Restrictions Precautions Precautions: Fall Restrictions Weight Bearing Restrictions: No      Mobility Bed Mobility Overal bed mobility: Needs Assistance Bed Mobility: Supine to Sit;Sit to Supine     Supine to sit: Supervision Sit to supine: Supervision   General bed mobility comments: for safety, lines  Transfers Overall transfer level: Needs assistance Equipment used: 1 person hand held assist Transfers: Sit to/from Stand Sit to Stand: Min assist                  ADL either performed or assessed with clinical judgement   ADL Overall ADL's : Needs assistance/impaired                                                          Pertinent Vitals/Pain Pain Assessment: No/denies pain     Hand Dominance     Extremity/Trunk Assessment Upper Extremity Assessment Upper Extremity Assessment: Overall WFL for tasks assessed           Communication Communication Communication: No difficulties   Cognition Arousal/Alertness: Awake/alert Behavior During Therapy: WFL for tasks assessed/performed Overall Cognitive Status: Within Functional Limits for tasks assessed                                                Home  Living Family/patient expects to be discharged to:: Private residence Living Arrangements: Alone   Type of Home: House Home Access: Stairs to enter [employer] of Steps: 3 Entrance Stairs-Rails: Right Home Layout: One level         Bathroom Toilet: Standard     Home Equipment: Walker - 2 wheels          Prior Functioning/Environment Level of Independence: Independent                 OT Problem List: Decreased strength;Decreased activity tolerance;Impaired balance (sitting and/or standing);Decreased cognition      OT Treatment/Interventions: Self-care/ADL training;Patient/family education    OT Goals(Current goals can be found in the care plan section) Acute Rehab OT Goals Patient Stated Goal: get printer working with [employer] OT Goal Formulation: With patient Time For Goal Achievement: 09/23/17 Potential to Achieve Goals: Good  OT Frequency: Min 2X/week   Barriers to D/C: Decreased caregiver support             AM-PAC PT "6 Clicks" Daily Activity     Outcome Measure Help from another person eating meals?: None Help from another person  taking care of personal grooming?: A Little Help from another person toileting, which includes using toliet, bedpan, or urinal?: A Little Help from another person bathing (including washing, rinsing, drying)?: A Little Help from another person to put on and taking off regular upper body clothing?: A Little Help from another person to put on and taking off regular lower body clothing?: A Little 6 Click Score: 19   End of Session Nurse Communication: Mobility status  Activity Tolerance: Patient tolerated treatment well Patient left: in bed;with call bell/phone within reach;with bed alarm set  OT Visit Diagnosis: Unsteadiness on feet (R26.81);Muscle weakness (generalized) (M62.81);Cognitive communication deficit (R41.841)                  Charges:  OT General Charges $OT Visit: 1 Visit OT Evaluation $OT Eval  Moderate Complexity: 1 Mod G-Codes:     Kari Baars, [place]  Betsy Pries 09/09/2017, 4:13 PM

## 2017-09-09 NOTE — Progress Notes (Signed)
Patient refusing to have blood sugar checked this morning, says it is a test of no value.  Attempted to educate patient, continued to refuse.

## 2017-09-09 NOTE — Plan of Care (Signed)
Patient stable during 7 a to 7 p shift, very confused, agitated and threatening and times, pleasantly confused others.  Son and wife in to visit this shift.

## 2017-09-10 DIAGNOSIS — N3000 Acute cystitis without hematuria: Secondary | ICD-10-CM | POA: Diagnosis not present

## 2017-09-10 DIAGNOSIS — Z794 Long term (current) use of insulin: Secondary | ICD-10-CM | POA: Diagnosis not present

## 2017-09-10 DIAGNOSIS — N401 Enlarged prostate with lower urinary tract symptoms: Secondary | ICD-10-CM | POA: Diagnosis not present

## 2017-09-10 DIAGNOSIS — N390 Urinary tract infection, site not specified: Secondary | ICD-10-CM | POA: Diagnosis not present

## 2017-09-10 DIAGNOSIS — R41841 Cognitive communication deficit: Secondary | ICD-10-CM | POA: Diagnosis present

## 2017-09-10 DIAGNOSIS — R4182 Altered mental status, unspecified: Secondary | ICD-10-CM | POA: Diagnosis not present

## 2017-09-10 DIAGNOSIS — E785 Hyperlipidemia, unspecified: Secondary | ICD-10-CM | POA: Diagnosis not present

## 2017-09-10 DIAGNOSIS — F330 Major depressive disorder, recurrent, mild: Secondary | ICD-10-CM | POA: Diagnosis present

## 2017-09-10 DIAGNOSIS — F0390 Unspecified dementia without behavioral disturbance: Secondary | ICD-10-CM | POA: Diagnosis not present

## 2017-09-10 DIAGNOSIS — E119 Type 2 diabetes mellitus without complications: Secondary | ICD-10-CM | POA: Diagnosis not present

## 2017-09-10 DIAGNOSIS — R262 Difficulty in walking, not elsewhere classified: Secondary | ICD-10-CM | POA: Diagnosis present

## 2017-09-10 DIAGNOSIS — R2689 Other abnormalities of gait and mobility: Secondary | ICD-10-CM | POA: Diagnosis present

## 2017-09-10 DIAGNOSIS — E1142 Type 2 diabetes mellitus with diabetic polyneuropathy: Secondary | ICD-10-CM | POA: Diagnosis not present

## 2017-09-10 DIAGNOSIS — N179 Acute kidney failure, unspecified: Secondary | ICD-10-CM | POA: Diagnosis present

## 2017-09-10 DIAGNOSIS — R739 Hyperglycemia, unspecified: Secondary | ICD-10-CM | POA: Diagnosis not present

## 2017-09-10 DIAGNOSIS — F0150 Vascular dementia without behavioral disturbance: Secondary | ICD-10-CM | POA: Diagnosis not present

## 2017-09-10 DIAGNOSIS — E1165 Type 2 diabetes mellitus with hyperglycemia: Secondary | ICD-10-CM | POA: Diagnosis not present

## 2017-09-10 DIAGNOSIS — G9340 Encephalopathy, unspecified: Secondary | ICD-10-CM | POA: Diagnosis not present

## 2017-09-10 DIAGNOSIS — F5101 Primary insomnia: Secondary | ICD-10-CM | POA: Diagnosis not present

## 2017-09-10 LAB — CBC WITH DIFFERENTIAL/PLATELET
Basophils Absolute: 0 10*3/uL (ref 0.0–0.1)
Basophils Relative: 0 %
Eosinophils Absolute: 0.1 10*3/uL (ref 0.0–0.7)
Eosinophils Relative: 2 %
HEMATOCRIT: 39.9 % (ref 39.0–52.0)
HEMOGLOBIN: 13.2 g/dL (ref 13.0–17.0)
LYMPHS PCT: 64 %
Lymphs Abs: 5.6 10*3/uL — ABNORMAL HIGH (ref 0.7–4.0)
MCH: 30.5 pg (ref 26.0–34.0)
MCHC: 33.1 g/dL (ref 30.0–36.0)
MCV: 92.1 fL (ref 78.0–100.0)
MONO ABS: 0.3 10*3/uL (ref 0.1–1.0)
MONOS PCT: 3 %
NEUTROS ABS: 2.7 10*3/uL (ref 1.7–7.7)
NEUTROS PCT: 31 %
Platelets: 170 10*3/uL (ref 150–400)
RBC: 4.33 MIL/uL (ref 4.22–5.81)
RDW: 13.9 % (ref 11.5–15.5)
WBC: 8.7 10*3/uL (ref 4.0–10.5)

## 2017-09-10 LAB — COMPREHENSIVE METABOLIC PANEL
ALK PHOS: 65 U/L (ref 38–126)
ALT: 21 U/L (ref 17–63)
AST: 20 U/L (ref 15–41)
Albumin: 3.7 g/dL (ref 3.5–5.0)
Anion gap: 9 (ref 5–15)
BILIRUBIN TOTAL: 0.7 mg/dL (ref 0.3–1.2)
BUN: 15 mg/dL (ref 6–20)
CALCIUM: 9 mg/dL (ref 8.9–10.3)
CO2: 24 mmol/L (ref 22–32)
Chloride: 108 mmol/L (ref 101–111)
Creatinine, Ser: 0.56 mg/dL — ABNORMAL LOW (ref 0.61–1.24)
GFR calc Af Amer: >60 mL/min (ref >60)
GLUCOSE: 90 mg/dL (ref 65–99)
Potassium: 3.9 mmol/L (ref 3.5–5.1)
Sodium: 141 mmol/L (ref 135–145)
TOTAL PROTEIN: 6.4 g/dL — AB (ref 6.5–8.1)

## 2017-09-10 LAB — GLUCOSE, CAPILLARY
GLUCOSE-CAPILLARY: 223 mg/dL — AB (ref 65–99)
Glucose-Capillary: 81 mg/dL (ref 65–99)
Glucose-Capillary: 144 mg/dL — ABNORMAL HIGH (ref 65–99)

## 2017-09-10 LAB — PHOSPHORUS: Phosphorus: 3.3 mg/dL (ref 2.5–4.6)

## 2017-09-10 LAB — MAGNESIUM: MAGNESIUM: 1.8 mg/dL (ref 1.7–2.4)

## 2017-09-10 MED ORDER — CIPROFLOXACIN HCL 500 MG PO TABS: 500.0000 mg | ORAL_TABLET | Freq: Two times a day (BID) | ORAL | 0 refills | Status: DC

## 2017-09-10 MED ORDER — LORAZEPAM 1 MG PO TABS
1.0000 mg | ORAL_TABLET | Freq: Once | ORAL | Status: AC
  Administered 2017-09-10: 1 mg via ORAL
  Filled 2017-09-10: qty 1

## 2017-09-10 MED ORDER — CIPROFLOXACIN HCL 500 MG PO TABS: 500.0000 mg | ORAL_TABLET | Freq: Two times a day (BID) | ORAL | Status: DC

## 2017-09-10 NOTE — Clinical Social Work Placement (Signed)
Pt discharged with plan to admit to [facility]- report [phone]  Pt's son selected facility and is aware pt has private room.  Arranged [facility] transportation Passr #[id number] A DC information provided via the [facility]  NOTE  Date:  09/10/2017  Patient Details  Name: Hayden [name] MRN: [id number] Date of Birth: 09-24-1940  Clinical Social Work is seeking post-discharge placement for this patient at the [place] level of care (*CSW will initial, date and re-position this form in  chart as items are completed):  Yes   Patient/family provided with [place] Work Department's list of facilities offering this level of care within the geographic area requested by the patient (or if unable, by the patient's family).  Yes   Patient/family informed of their freedom to choose among providers that offer the needed level of care, that participate in Medicare, Medicaid or managed care program needed by the patient, have an available bed and are willing to accept the patient.  Yes   Patient/family informed of [facility]'s ownership interest in [facility] and [facility], as well as of the fact that they are under no obligation to receive care at these facilities.  PASRR submitted to EDS on 09/10/17     PASRR number received on 09/10/17     Existing PASRR number confirmed on       FL2 transmitted to all facilities in geographic area requested by pt/family on 09/10/17     FL2 transmitted to all facilities within larger geographic area on       Patient informed that his/her managed care company has contracts with or will negotiate with certain facilities, including the following:  ([facility])     Yes   Patient/family informed of bed offers received.  Patient chooses bed at ([facility])     Physician recommends and patient chooses bed at ([facility])    Patient to be  transferred to ([facility]) on 09/10/17.  Patient to be transferred to facility by [facility]     Patient family notified on 09/10/17 of transfer.  Name of family member notified:  son [facility]      PHYSICIAN       Additional Comment:    _______________________________________________ Nila Nephew, LCSW 09/10/2017, 3:32 PM  [phone]

## 2017-09-10 NOTE — Progress Notes (Signed)
Occupational Therapy Treatment Patient Details Name: Hayden [name] MRN: [id number] DOB: 04-May-1941 Today's Date: 09/10/2017    History of present illness 77 yo male admitted with UTI. Hx of dementia, falls. Pt is from home alone.    OT comments  Pt performed ADL and walked in the hall.  Needs mostly min guard with occasional min A  Follow Up Recommendations  SNF    Equipment Recommendations  None recommended by OT    Recommendations for Other Services      Precautions / Restrictions Precautions Precautions: Fall Restrictions Weight Bearing Restrictions: No       Mobility Bed Mobility         Supine to sit: Supervision Sit to supine: Supervision   General bed mobility comments: for lines  Transfers   Equipment used: None   Sit to Stand: Min guard              Balance               Standing balance comment: min guard for safety                           ADL either performed or assessed with clinical judgement   ADL Overall ADL's : Needs assistance/impaired         Upper Body Bathing: Supervision/ safety;Standing   Lower Body Bathing: Min guard;Sit to/from stand   Upper Body Dressing : Minimal assistance;Standing(for lines)       Toilet Transfer: Min guard;Ambulation;Comfort height toilet   Toileting- Clothing Manipulation and Hygiene: Min guard;Sit to/from stand         General ADL Comments: ambulated to bathroom and performed ADL.  Pt requested to walk in hall afterwards:  min guard for safety.  Pt initially unsteady when he took first steps to bathroom; then this improved     Vision       Perception     Praxis      Cognition Arousal/Alertness: Awake/alert Behavior During Therapy: WFL for tasks assessed/performed Overall Cognitive Status: Within Functional Limits for tasks assessed                                 General Comments: pt gets lost in thought at times        Exercises      Shoulder Instructions       General Comments      Pertinent Vitals/ Pain       Pain Assessment: No/denies pain  Home Living                                          Prior Functioning/Environment              Frequency           Progress Toward Goals  OT Goals(current goals can now be found in the care plan section)  Progress towards OT goals: (goals set today)  ADL Goals Pt Will Transfer to Toilet: with supervision;ambulating;bedside commode Additional ADL Goal #1: pt will complete ADL with supervision, including gathering clothes  Plan      Co-evaluation                 AM-PAC PT "6 Clicks" Daily Activity     Outcome Measure  Help from another person eating meals?: None Help from another person taking care of personal grooming?: A Little Help from another person toileting, which includes using toliet, bedpan, or urinal?: A Little Help from another person bathing (including washing, rinsing, drying)?: A Little Help from another person to put on and taking off regular upper body clothing?: A Little Help from another person to put on and taking off regular lower body clothing?: A Little 6 Click Score: 19    End of Session    OT Visit Diagnosis: Unsteadiness on feet (R26.81);Muscle weakness (generalized) (M62.81);Cognitive communication deficit (R41.841)   Activity Tolerance Patient tolerated treatment well   Patient Left in bed;with call bell/phone within reach;with bed alarm set   Nurse Communication          Time: 3729-0211 OT Time Calculation (min): 27 min  Charges: OT General Charges $OT Visit: 1 Visit OT Treatments $Self Care/Home Management : 8-22 mins $Therapeutic Activity: 8-22 mins  Hayden [name], OTR/L [phone] 09/10/2017   Hayden [name] 09/10/2017, 3:32 PM

## 2017-09-10 NOTE — Progress Notes (Addendum)
Physical Therapy Treatment Patient Details Name: Hayden [name] MRN: [id number] DOB: [birth year] Today's Date: 09/10/2017    History of Present Illness 77 yo male admitted with UTI. Hx of dementia, falls. Pt is from home alone.     PT Comments    Pt ambulated 400' holding IV pole with 1 episode of mild loss of balance, pt able to self correct. Pt verbalized awareness of times of confusion and unsteadiness with walking and is agreeable to ST-SNF. Will try use of a cane next session.   Follow Up Recommendations  SNF;Supervision for mobility/OOB(if pt returns home would recommend 24 hour supervision)     Equipment Recommendations  Straight cane   Recommendations for Other Services       Precautions / Restrictions Precautions Precautions: Fall Restrictions Weight Bearing Restrictions: No    Mobility  Bed Mobility Overal bed mobility: Needs Assistance Bed Mobility: Supine to Sit;Sit to Supine     Supine to sit: Modified independent (Device/Increase time) Sit to supine: Supervision   General bed mobility comments: for safety, lines  Transfers Overall transfer level: Needs assistance Equipment used: None Transfers: Sit to/from Stand Sit to Stand: Supervision         General transfer comment: supervision for safety  Ambulation/Gait Ambulation/Gait assistance: Supervision;Min guard Ambulation Distance (Feet): 400 Feet Assistive device: (held IV pole with RUE) Gait Pattern/deviations: WFL(Within Functional Limits)   Gait velocity interpretation: at or above normal speed for age/gender General Gait Details: mild LOB x 1, pt able to self correct, held IV pole   Stairs            Wheelchair Mobility    Modified Rankin (Stroke Patients Only)       Balance             Standing balance-Leahy Scale: Fair                              Cognition Arousal/Alertness: Awake/alert Behavior During Therapy: WFL for tasks  assessed/performed Overall Cognitive Status: Within Functional Limits for tasks assessed                                        Exercises      General Comments        Pertinent Vitals/Pain Pain Assessment: No/denies pain    Home Living                      Prior Function            PT Goals (current goals can now be found in the care plan section) Acute Rehab PT Goals Patient Stated Goal: pt agreeable to rehab, recognizes he has times of confusion and unsteadiness PT Goal Formulation: With patient Time For Goal Achievement: 09/22/17 Progress towards PT goals: Progressing toward goals    Frequency    Min 3X/week      PT Plan Current plan remains appropriate    Co-evaluation              AM-PAC PT "6 Clicks" Daily Activity  Outcome Measure  Difficulty turning over in bed (including adjusting bedclothes, sheets and blankets)?: None Difficulty moving from lying on back to sitting on the side of the bed? : None Difficulty sitting down on and standing up from a chair with arms (e.g., wheelchair, bedside commode, etc,.)?:  None Help needed moving to and from a bed to chair (including a wheelchair)?: A Little Help needed walking in hospital room?: A Little Help needed climbing 3-5 steps with a railing? : A Little 6 Click Score: 21    End of Session Equipment Utilized During Treatment: Gait belt Activity Tolerance: Patient tolerated treatment well Patient left: in bed;with call bell/phone within reach;with bed alarm set Nurse Communication: Mobility status PT Visit Diagnosis: Difficulty in walking, not elsewhere classified (R26.2);History of falling (Z91.81);Repeated falls (R29.6);Unsteadiness on feet (R26.81)     Time: 3491-7915 PT Time Calculation (min) (ACUTE ONLY): 22 min  Charges:  $Gait Training: 8-22 mins                    G Codes:          Philomena Doheny 09/10/2017, 10:46 AM [phone]

## 2017-09-10 NOTE — Discharge Summary (Signed)
Physician Discharge Summary  Hayden [name] WUJ:[id number] DOB: 02/16/1941 DOA: 09/06/2017  PCP: Hayden Fairly, MD (Inactive)  Admit date: 09/06/2017 Discharge date: 09/10/2017  Admitted From: Home Disposition: SNF  Recommendations for Outpatient Follow-up:  1. Follow up with PCP in 1-2 weeks 2. Follow up with Oncology for CLL as an outpatient  3. Please obtain CMP/CBC, Mag, Phos in one week 4. Please follow up on the following pending results:  Home Health: No Equipment/Devices: Straight Cane  Discharge Condition: Stable CODE STATUS: FULL CODE Diet recommendation: Heart Healthy Carb Modified Diet  Brief/Interim Summary: Hayden [name] a77 y.o.male,w hyperlipidemia, DM2,CLL, Depression and other comorbidities who presented with intermittent confusion per home health aide per ED. Pt was alert but very confused on admission,ruled out for an acute stroke.Symptoms were secondary to urinary tract infection and it was found to be a Pseudomonal UTI. Not safe to be discharged to his previous living condition. PT OT evaluation recommended SNF and social worker was consulted for placement. Abx changed from IV Ceftriaxone to IV Cefepime given Pseudomonal UTI and will now change to po Ciprofloxacin at D/C to complete 7 day course. Patient was deemed stable to D/C as he improved and will need Strength Training at SNF.  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 No acute intracranial findings or  mass lesions -Currently getting treatment for UTI as below  -TSH within normal limits, vitamin B-12 382; C/w 1000 mcg po Vitamin B12 Daily -Delirium Precautions  -C/w ASA 81 mg and with Pravastatin 20 mg po Daily  -PT/OT evaluation recommending SNF for Strength Training  -Social Work Consulted for Placement -Patient deemed stable to D/C to SNF  Pseudomonas Aeruginosa UTI -Showed>100,000 CFU of Pseudomonas  -Was on Rocephin but changed to IV Cefepime given Sensitivities and Susceptibilities  -Recently had Klebsiella Pneumoniae UTI last week -Now changing to Ciprofloxacin at D/C given Sensitivities -Follow up with PCP at D/C   Diabetes Mellitus Type 2 -C/w Home Insulin Regimen at D/C -C/w Levemir 8 units sq Daily and Sensitive Novolog SSI while hospitalized -CBG's ranging from 81-223 -Continue to Monitor Blood Sugar at SNF  Hypomagnesemia -Patient's Mag Level was 1.6 and improved to 1.8 -Replete with IV Mag Sulfate 2 grams yesterday  -Continue to Monitor and Replete as Necessary -Repeat Mag Level at SNF  HLD -C/w Pravastatin 20 mg po qHS -C/w Home Fish Oil   BPH -C/w Tamsulosin 0.4 mg po qHS -Stopped Rapaflo    Depression -C/w Wellbutrin 100 mg po BID  Hx of CLL -Diagnosed in 2008 -Stable. WBC was 8.7 -Follow up as an outpatient   Discharge Instructions  Discharge Instructions    Call MD for:  difficulty breathing, headache or visual disturbances   Complete by:  As directed    Call MD for:  extreme fatigue   Complete by:  As directed    Call MD for:  hives   Complete by:  As directed    Call MD for:  persistant dizziness or light-headedness   Complete by:  As directed    Call MD for:  persistant  nausea and vomiting   Complete by:  As directed    Call MD for:  redness, tenderness, or signs of infection (pain, swelling, redness, odor or green/yellow discharge around incision site)   Complete by:  As directed    Call MD for:  severe uncontrolled pain    Complete by:  As directed    Call MD for:  temperature >100.4   Complete by:  As directed    Diet - low sodium heart healthy   Complete by:  As directed    Discharge instructions   Complete by:  As directed    Follow up with PCP at D/C. Take all medications as prescribed. If symptoms change or worsen please return to the ED for evaluation.   Increase activity slowly   Complete by:  As directed      Allergies as of 09/10/2017      Reactions   Bee Venom Other (See Comments)   Bump where stung and took days to go away   Glucophage [metformin] Diarrhea   "i will never take it again -stool incontinent "   Shellfish Allergy Swelling      Medication List    STOP taking these medications   cephALEXin 500 MG capsule Commonly known as:  KEFLEX   multivitamin with minerals Tabs tablet   naproxen sodium 220 MG tablet Commonly known as:  ALEVE   silodosin 8 MG Caps capsule Commonly known as:  RAPAFLO   temazepam 30 MG capsule Commonly known as:  RESTORIL   traZODone 50 MG tablet Commonly known as:  DESYREL     TAKE these medications   aspirin 81 MG tablet Take 81 mg by mouth at bedtime.   buPROPion 100 MG tablet Commonly known as:  WELLBUTRIN Take 100 mg by mouth 2 (two) times daily.   ciprofloxacin 500 MG tablet Commonly known as:  CIPRO Take 1 tablet (500 mg total) by mouth 2 (two) times daily.   fish oil-omega-3 fatty acids 1000 MG capsule Take 1 g by mouth 2 (two) times daily.   HUMALOG KWIKPEN 100 UNIT/ML KiwkPen Generic drug:  insulin lispro Inject 3-5 Units into the skin 3 (three) times daily before meals.   LEVEMIR FLEXTOUCH 100 UNIT/ML Pen Generic drug:  Insulin Detemir Inject 8 Units into the skin at bedtime.   lovastatin 20 MG tablet Commonly known as:  MEVACOR Take 20 mg by mouth at bedtime.   tamsulosin 0.4 MG Caps capsule Commonly known as:  FLOMAX Take 0.4 mg by mouth at bedtime.   vitamin B-12 1000 MCG tablet Commonly known as:   CYANOCOBALAMIN Take 1,000 mcg by mouth daily.   Vitamin D3 1000 units Caps Take 1 capsule by mouth at bedtime.   zolpidem 5 MG tablet Commonly known as:  AMBIEN Take 5 mg by mouth at bedtime as needed for sleep.       Allergies  Allergen Reactions  . Bee Venom Other (See Comments)    Bump where stung and took days to go away  . Glucophage [Metformin] Diarrhea    "i will never take it again -stool incontinent "  . Shellfish Allergy Swelling   Consultations:  None  Procedures/Studies: Ct Head Wo Contrast  Result Date: 09/07/2017 CLINICAL DATA:  Confusion.  Altered mental status. EXAM: CT HEAD WITHOUT CONTRAST TECHNIQUE: Contiguous axial images were obtained from the base of the skull through the vertex without intravenous contrast. COMPARISON:  None. FINDINGS: Brain: Age related cerebral atrophy, ventriculomegaly and periventricular white matter  disease. No extra-axial fluid collections are identified. No CT findings for acute hemispheric infarction or intracranial hemorrhage. No mass lesions. The brainstem and cerebellum are normal. Vascular: No hyperdense vessels or worrisome calcifications. Skull: No skull fracture or bone lesions. Sinuses/Orbits: The paranasal sinuses and mastoid air cells are clear. The globes are intact. Other: No scalp lesions or hematoma. IMPRESSION: Age related cerebral atrophy, ventriculomegaly and periventricular white matter disease. No acute intracranial findings or mass lesions. Electronically Signed   By: Marijo Sanes M.D.   On: 09/07/2017 10:51   Mr Brain Wo Contrast  Result Date: 09/07/2017 CLINICAL DATA:  77 y/o  M; acute onset of confusion. EXAM: MRI HEAD WITHOUT CONTRAST TECHNIQUE: Multiplanar, multiecho pulse sequences of the brain and surrounding structures were obtained without intravenous contrast. COMPARISON:  09/07/2017 CT of the head FINDINGS: Brain: No acute infarction, hemorrhage, hydrocephalus, extra-axial collection or mass lesion.  Numerousnonspecific foci of T2 FLAIR hyperintense signal abnormality in subcortical and periventricular white matter are compatible withmoderatechronic microvascular ischemic changes for age. Moderatebrain parenchymal volume loss. Increased signal and decreased size in the left posterior hippocampus (series 10, image 13). Vascular: Normal flow voids. Skull and upper cervical spine: Normal marrow signal. Sinuses/Orbits: Mild paranasal sinus mucosal thickening. No significant abnormal signal of mastoid air cells. Orbits are unremarkable. Other: None. IMPRESSION: 1. Increased signal and decreased size of left posterior hippocampus may be related to prior infection, inflammation, or ischemia. If patient has history of seizures, mesial temporal sclerosis is possible. 2. Moderate chronic microvascular ischemic changes and moderate parenchymal volume loss of the brain. 3. No acute stroke, hemorrhage, or focal mass effect. Electronically Signed   By: Kristine Garbe M.D.   On: 09/07/2017 22:10    Subjective: Seen and examined and awoken from sleep. Still feels he has some Urinary Urgency; No CP or SOB. Feeling well and agreeable to SNF.  Discharge Exam: Vitals:   09/10/17 0503 09/10/17 1300  BP: (!) 141/60 135/71  Pulse: 64 77  Resp: 18   Temp: 97.6 F (36.4 C) 98.6 F (37 C)  SpO2: 100% 98%   Vitals:   09/09/17 1408 09/09/17 2038 09/10/17 0503 09/10/17 1300  BP: 109/69 125/69 (!) 141/60 135/71  Pulse: 75 75 64 77  Resp: 16 18 18    Temp: 98.7 F (37.1 C) 98.4 F (36.9 C) 97.6 F (36.4 C) 98.6 F (37 C)  TempSrc: Oral Oral Axillary Oral  SpO2: 99% 99% 100% 98%  Weight:   61.5 kg (135 lb 9.6 oz)   Height:       General: Pt is alert, awake, not in acute distress Cardiovascular: RRR, S1/S2 +, no rubs, no gallops Respiratory: Diminished bilaterally, no wheezing, no rhonchi; Unlabored Breathing Abdominal: Soft, NT, ND, bowel sounds + Extremities: no edema, no cyanosis; Has a small ulcer  on Right leg medially near ankle  The results of significant diagnostics from this hospitalization (including imaging, microbiology, ancillary and laboratory) are listed below for reference.    Microbiology: Recent Results (from the past 240 hour(s))  Urine culture     Status: Abnormal   Collection Time: 09/06/17  8:01 PM  Result Value Ref Range Status   Specimen Description   Final    URINE, RANDOM Performed at [facility] [address] [place], [place] [address]    Special Requests   Final    NONE Performed at [facility], [place] [address] [place], [place] [address]    Culture >=100,000 COLONIES/mL PSEUDOMONAS AERUGINOSA (  A)  Final   Report Status 09/09/2017 FINAL  Final   Organism ID, Bacteria PSEUDOMONAS AERUGINOSA (A)  Final      Susceptibility   Pseudomonas aeruginosa - MIC*    CEFTAZIDIME 4 SENSITIVE Sensitive     CIPROFLOXACIN <=0.25 SENSITIVE Sensitive     GENTAMICIN <=1 SENSITIVE Sensitive     IMIPENEM 1 SENSITIVE Sensitive     PIP/TAZO 8 SENSITIVE Sensitive     CEFEPIME 4 SENSITIVE Sensitive     * >=100,000 COLONIES/mL PSEUDOMONAS AERUGINOSA    Labs: BNP (last 3 results) No results for input(s): BNP in the last 8760 hours. Basic Metabolic Panel: Recent Labs  Lab 09/06/17 1626 09/07/17 0450 09/09/17 0806 09/10/17 0358  NA 136 138 136 141  K 3.7 4.1 4.0 3.9  CL 100* 107 103 108  CO2 25 24 25 24   GLUCOSE 181* 271* 235* 90  BUN 22* 17 17 15   CREATININE 0.78 0.79 0.74 0.56*  CALCIUM 9.7 9.0 8.9 9.0  MG  --   --  1.6* 1.8  PHOS  --   --  2.6 3.3   Liver Function Tests: Recent Labs  Lab 09/06/17 1626 09/07/17 0450 09/09/17 0806 09/10/17 0358  AST 28 20 17 20   ALT 28 22 18 21   ALKPHOS 84 70 61 65  BILITOT 1.4* 1.0 0.9 0.7  PROT 7.5 6.0* 6.0* 6.4*  ALBUMIN 4.6 3.5 3.5 3.7   No results for input(s): LIPASE, AMYLASE in the last 168 hours. No results for input(s): AMMONIA in the last 168 hours. CBC: Recent  Labs  Lab 09/06/17 1626 09/07/17 0450 09/09/17 0806 09/10/17 0358  WBC 5.7 9.5 8.1 8.7  NEUTROABS 4.2  --  2.6 2.7  HGB 14.8 12.7* 13.0 13.2  HCT 42.6 38.8*  39.1 38.9* 39.9  MCV 90.3 91.9 92.6 92.1  PLT 218 191 175 170   Cardiac Enzymes: No results for input(s): CKTOTAL, CKMB, CKMBINDEX, TROPONINI in the last 168 hours. BNP: Invalid input(s): POCBNP CBG: Recent Labs  Lab 09/09/17 1152 09/09/17 1715 09/09/17 2035 09/10/17 0755 09/10/17 1246  GLUCAP 234* 295* 254* 81 223*   D-Dimer No results for input(s): DDIMER in the last 72 hours. Hgb A1c No results for input(s): HGBA1C in the last 72 hours. Lipid Profile No results for input(s): CHOL, HDL, LDLCALC, TRIG, CHOLHDL, LDLDIRECT in the last 72 hours. Thyroid function studies Recent Labs    09/08/17 0442  TSH 1.489   Anemia work up No results for input(s): VITAMINB12, FOLATE, FERRITIN, TIBC, IRON, RETICCTPCT in the last 72 hours. Urinalysis    Component Value Date/Time   COLORURINE YELLOW 09/06/2017 1937   APPEARANCEUR CLEAR 09/06/2017 1937   LABSPEC 1.005 09/06/2017 1937   PHURINE 8.0 09/06/2017 1937   GLUCOSEU NEGATIVE 09/06/2017 1937   HGBUR SMALL (A) 09/06/2017 1937   BILIRUBINUR NEGATIVE 09/06/2017 1937   KETONESUR 5 (A) 09/06/2017 1937   PROTEINUR NEGATIVE 09/06/2017 1937   NITRITE NEGATIVE 09/06/2017 1937   LEUKOCYTESUR LARGE (A) 09/06/2017 1937   Sepsis Labs Invalid input(s): PROCALCITONIN,  WBC,  LACTICIDVEN Microbiology Recent Results (from the past 240 hour(s))  Urine culture     Status: Abnormal   Collection Time: 09/06/17  8:01 PM  Result Value Ref Range Status   Specimen Description   Final    URINE, RANDOM Performed at [facility], [place] [address], [place], [place] [address]    Special Requests   Final    NONE Performed at [facility], [address] [place], [place] [address]    Culture >=100,000 COLONIES/mL PSEUDOMONAS AERUGINOSA (A)  Final    Report Status 09/09/2017 FINAL  Final   Organism ID, Bacteria PSEUDOMONAS AERUGINOSA (A)  Final      Susceptibility   Pseudomonas aeruginosa - MIC*    CEFTAZIDIME 4 SENSITIVE Sensitive     CIPROFLOXACIN <=0.25 SENSITIVE Sensitive     GENTAMICIN <=1 SENSITIVE Sensitive     IMIPENEM 1 SENSITIVE Sensitive     PIP/TAZO 8 SENSITIVE Sensitive     CEFEPIME 4 SENSITIVE Sensitive     * >=100,000 COLONIES/mL PSEUDOMONAS AERUGINOSA   Time coordinating discharge: 35 minutes  SIGNED:  Kerney Elbe, DO [facility] 09/10/2017, 2:19 PM Pager [phone]  If 7PM-7AM, please contact night-coverage [email or web address] Password [email or web address]

## 2017-09-10 NOTE — Clinical Social Work Note (Signed)
Clinical Social Work Assessment  Patient Details  Name: Hayden [name] MRN: [id number] Date of Birth: [birth year]  Date of referral:  09/10/17               Reason for consult:  Facility Placement                Permission sought to share information with:  Facility Sport and exercise psychologist, Family Supports Permission granted to share information::  Yes, Verbal Permission Granted  Name::     Hayden [name]  Agency::     Relationship::  Son   Contact Information:  [phone]  Housing/Transportation Living arrangements for the past 2 months:  Single Family Home Source of Information:  Patient, Adult Children Patient Interpreter Needed:  None Criminal Activity/Legal Involvement Pertinent to Current Situation/Hospitalization:  No - Comment as needed Significant Relationships:  Adult Children Lives with:  Self Do you feel safe going back to the place where you live?  (PT recommending SNF) Need for family participation in patient care:  Yes (Comment)  Care giving concerns:  Patient from home alone with caregivers. Patient's son reported that patient completes ADLs and ambulates independently at baseline. Patient's son reported that patient's caregivers hours were recently decreased because patient was doing so well. Patient's son reported that patient's UTI has caused his mental and physical change. PT recommending SNF/24 hour supervision.    Social Worker assessment / plan:  CSW spoke with patient at bedside regarding PT recommendation for SNF for ST rehab, patient reported that he was agreeable. CSW explained SNF placement process, patient verbalized understanding. Patient granted CSW permission to speak with his son about discharge planning.  CSW spoke with patient's son regarding discharge planning. Patient's son agreeable to SNF for ST rehab.  CSW completed FL2 and completed patient's PASRR. Patient's PASRR under manual review, MUST ID [id number].   CSW will continue to follow and assist  with discharge planning.  Employment status:  Retired Forensic scientist:  Medicare PT Recommendations:  [place] / Referral to community resources:  [place]  Patient/Family's Response to care:  Patient/patient's son appreciative of CSW assistance with discharge planning.  Patient/Family's Understanding of and Emotional Response to Diagnosis, Current Treatment, and Prognosis:  Patient presented calm and verbalized plan to dc to SNF for ST rehab. Patient's son involved in patient's care and hopeful that patient will return to baseline of functioning.   Emotional Assessment Appearance:  Appears stated age Attitude/Demeanor/Rapport:  Engaged Affect (typically observed):  Calm Orientation:  Oriented to Self, Fluctuating Orientation (Suspected and/or reported Sundowners), Oriented to Place, Oriented to  Time, Oriented to Situation Alcohol / Substance use:  Not Applicable Psych involvement (Current and /or in the community):  No (Comment)  Discharge Needs  Concerns to be addressed:  Care Coordination Readmission within the last 30 days:  No Current discharge risk:  Lives alone Barriers to Discharge:  [place] ([place])   Burnis Medin, LCSW 09/10/2017, 11:48 AM

## 2017-09-10 NOTE — NC FL2 (Addendum)
[place] LEVEL OF CARE SCREENING TOOL     IDENTIFICATION  Patient Name: Hayden [name] Birthdate: 1941-05-23 Sex: male Admission Date (Current Location): 09/06/2017  [facility] and [place] Number:  Herbalist and Address:  [facility],  [address] [place], [place]      Provider Number: [id number]  Attending Physician Name and Address:  Kerney Elbe, DO  Relative Name and Phone Number:       Current Level of Care: Hospital Recommended Level of Care: [place] Prior Approval Number:    Date Approved/Denied:   PASRR Number:    Discharge Plan: SNF    Current Diagnoses: Patient Active Problem List   Diagnosis Date Noted  . UTI (urinary tract infection) 09/06/2017  . Hyperglycemia 09/06/2017  . ARF (acute renal failure) ([place]) 09/06/2017  . Altered mental status 09/06/2017  . Type 2 diabetes mellitus with complication ([place]) 35/57/3220  . CLL (chronic lymphocytic leukemia) ([place]) 09/01/2013    Orientation RESPIRATION BLADDER Height & Weight     Self, Time, Situation, Place  Normal Continent Weight: 135 lb 9.6 oz (61.5 kg) Height:  5' 8.5" (174 cm)  BEHAVIORAL SYMPTOMS/MOOD NEUROLOGICAL BOWEL NUTRITION STATUS        Diet(heart healthy/carb modified)  AMBULATORY STATUS COMMUNICATION OF NEEDS Skin   Limited Assist Verbally Normal                       Personal Care Assistance Level of Assistance  Bathing, Feeding, Dressing Bathing Assistance: Limited assistance Feeding assistance: Independent Dressing Assistance: Limited assistance     Functional Limitations Info  Hearing, Sight, Speech Sight Info: Adequate Hearing Info: Impaired Speech Info: Adequate    SPECIAL CARE FACTORS FREQUENCY  PT (By licensed PT), OT (By licensed OT)     PT Frequency: 5x/week OT Frequency: 5x/week            Contractures Contractures Info: Not present    Additional Factors Info  Code Status, Allergies       Insulin Sliding Scale Code Status Info: Full Code Allergies Info: Bee Venom;Shellfish Allergy;Glucophage Metformin     insulin aspart  0-5 Units Subcutaneous QHS insulin aspart  0-9 Units Subcutaneous TID WC     insulin detemir  8 Units Subcutaneous QHS       Current Medications (09/10/2017):  This is the current hospital active medication list Current Facility-Administered Medications  Medication Dose Route Frequency Provider Last Rate Last Dose  . 0.9 %  sodium chloride infusion   Intravenous Continuous Reyne Dumas, MD 75 mL/hr at 09/09/17 1724    . acetaminophen (TYLENOL) tablet 650 mg  650 mg Oral Q6H PRN Jani Gravel, MD       Or  . acetaminophen (TYLENOL) suppository 650 mg  650 mg Rectal Q6H PRN Jani Gravel, MD      . aspirin EC tablet 81 mg  81 mg Oral Loma Sousa, MD   81 mg at 09/09/17 2108  . buPROPion [facility]) tablet 100 mg  100 mg Oral BID Jani Gravel, MD   100 mg at 09/09/17 0818  . ceFEPIme (MAXIPIME) 1 g in sodium chloride 0.9 % 100 mL IVPB  1 g Intravenous [address] Omair [place], DO   Stopped at 09/10/17 0210  . ciprofloxacin (CIPRO) tablet 500 mg  500 mg Oral BID [name], Omair Latif, DO      . enoxaparin (LOVENOX) injection 40 mg  40 mg Subcutaneous Q24H Maudie Mercury,  Jeneen Rinks, MD   40 mg at 09/09/17 2106  . insulin aspart (novoLOG) injection 0-5 Units  0-5 Units Subcutaneous QHS Jani Gravel, MD   3 Units at 09/09/17 2130  . insulin aspart (novoLOG) injection 0-9 Units  0-9 Units Subcutaneous TID WC Jani Gravel, MD   5 Units at 09/09/17 1731  . insulin detemir (LEVEMIR) injection 8 Units  8 Units Subcutaneous QHS Jani Gravel, MD   8 Units at 09/09/17 2130  . pravastatin (PRAVACHOL) tablet 20 mg  20 mg Oral q1800 Jani Gravel, MD   20 mg at 09/09/17 1731  . tamsulosin (FLOMAX) capsule 0.4 mg  0.4 mg Oral QHS Jani Gravel, MD   0.4 mg at 09/09/17 2108  . traZODone (DESYREL) tablet 50 mg  50 mg Oral QHS PRN Jani Gravel, MD   50 mg at 09/09/17 2107  . vitamin B-12 (CYANOCOBALAMIN)  tablet 1,000 mcg  1,000 mcg Oral Daily Jani Gravel, MD   1,000 mcg at 09/09/17 0677     Discharge Medications: Please see discharge summary for a list of discharge medications.  Relevant Imaging Results:  Relevant Lab Results:   Additional Information SSN   [id number]  Burnis Medin, LCSW

## 2017-09-10 NOTE — Progress Notes (Signed)
Provided SNF bed offers to pt's son. States he will review and call back with choices.  Also awaiting pt's PASSR approval. Requested documents faxed.  Sharren Bridge, MSW, LCSW Clinical Social Work 09/10/2017 [phone]

## 2017-09-14 DIAGNOSIS — N401 Enlarged prostate with lower urinary tract symptoms: Secondary | ICD-10-CM | POA: Diagnosis not present

## 2017-09-14 DIAGNOSIS — E119 Type 2 diabetes mellitus without complications: Secondary | ICD-10-CM | POA: Diagnosis not present

## 2017-09-14 DIAGNOSIS — N390 Urinary tract infection, site not specified: Secondary | ICD-10-CM | POA: Diagnosis not present

## 2017-09-14 DIAGNOSIS — F0150 Vascular dementia without behavioral disturbance: Secondary | ICD-10-CM | POA: Diagnosis not present

## 2017-09-15 DIAGNOSIS — E1165 Type 2 diabetes mellitus with hyperglycemia: Secondary | ICD-10-CM | POA: Diagnosis not present

## 2017-09-15 DIAGNOSIS — Z794 Long term (current) use of insulin: Secondary | ICD-10-CM | POA: Diagnosis not present

## 2017-09-15 DIAGNOSIS — E1142 Type 2 diabetes mellitus with diabetic polyneuropathy: Secondary | ICD-10-CM | POA: Diagnosis not present

## 2017-09-16 DIAGNOSIS — N390 Urinary tract infection, site not specified: Secondary | ICD-10-CM | POA: Diagnosis not present

## 2017-09-16 DIAGNOSIS — E119 Type 2 diabetes mellitus without complications: Secondary | ICD-10-CM | POA: Diagnosis not present

## 2017-09-16 DIAGNOSIS — F5101 Primary insomnia: Secondary | ICD-10-CM | POA: Diagnosis not present

## 2017-09-16 DIAGNOSIS — F0150 Vascular dementia without behavioral disturbance: Secondary | ICD-10-CM | POA: Diagnosis not present

## 2017-09-17 DIAGNOSIS — E785 Hyperlipidemia, unspecified: Secondary | ICD-10-CM | POA: Diagnosis not present

## 2017-09-17 DIAGNOSIS — F0390 Unspecified dementia without behavioral disturbance: Secondary | ICD-10-CM | POA: Diagnosis not present

## 2017-09-17 DIAGNOSIS — N390 Urinary tract infection, site not specified: Secondary | ICD-10-CM | POA: Diagnosis not present

## 2017-09-17 DIAGNOSIS — E119 Type 2 diabetes mellitus without complications: Secondary | ICD-10-CM | POA: Diagnosis not present

## 2017-09-28 DIAGNOSIS — N400 Enlarged prostate without lower urinary tract symptoms: Secondary | ICD-10-CM | POA: Diagnosis not present

## 2017-09-30 DIAGNOSIS — N3281 Overactive bladder: Secondary | ICD-10-CM | POA: Diagnosis not present

## 2017-09-30 DIAGNOSIS — D649 Anemia, unspecified: Secondary | ICD-10-CM | POA: Diagnosis not present

## 2017-09-30 DIAGNOSIS — E1165 Type 2 diabetes mellitus with hyperglycemia: Secondary | ICD-10-CM | POA: Diagnosis not present

## 2017-09-30 DIAGNOSIS — N400 Enlarged prostate without lower urinary tract symptoms: Secondary | ICD-10-CM | POA: Diagnosis not present

## 2017-09-30 DIAGNOSIS — G4700 Insomnia, unspecified: Secondary | ICD-10-CM | POA: Diagnosis not present

## 2017-10-02 DIAGNOSIS — G8929 Other chronic pain: Secondary | ICD-10-CM | POA: Diagnosis not present

## 2017-10-02 DIAGNOSIS — E1165 Type 2 diabetes mellitus with hyperglycemia: Secondary | ICD-10-CM | POA: Diagnosis not present

## 2017-10-02 DIAGNOSIS — F330 Major depressive disorder, recurrent, mild: Secondary | ICD-10-CM | POA: Diagnosis not present

## 2017-10-02 DIAGNOSIS — Z794 Long term (current) use of insulin: Secondary | ICD-10-CM | POA: Diagnosis not present

## 2017-10-02 DIAGNOSIS — F0150 Vascular dementia without behavioral disturbance: Secondary | ICD-10-CM | POA: Diagnosis not present

## 2017-10-02 DIAGNOSIS — N400 Enlarged prostate without lower urinary tract symptoms: Secondary | ICD-10-CM | POA: Diagnosis not present

## 2017-10-05 DIAGNOSIS — E1165 Type 2 diabetes mellitus with hyperglycemia: Secondary | ICD-10-CM | POA: Diagnosis not present

## 2017-10-05 DIAGNOSIS — F330 Major depressive disorder, recurrent, mild: Secondary | ICD-10-CM | POA: Diagnosis not present

## 2017-10-05 DIAGNOSIS — N400 Enlarged prostate without lower urinary tract symptoms: Secondary | ICD-10-CM | POA: Diagnosis not present

## 2017-10-05 DIAGNOSIS — F0150 Vascular dementia without behavioral disturbance: Secondary | ICD-10-CM | POA: Diagnosis not present

## 2017-10-05 DIAGNOSIS — G8929 Other chronic pain: Secondary | ICD-10-CM | POA: Diagnosis not present

## 2017-10-05 DIAGNOSIS — Z794 Long term (current) use of insulin: Secondary | ICD-10-CM | POA: Diagnosis not present

## 2017-10-08 DIAGNOSIS — F411 Generalized anxiety disorder: Secondary | ICD-10-CM | POA: Diagnosis not present

## 2017-10-09 DIAGNOSIS — F330 Major depressive disorder, recurrent, mild: Secondary | ICD-10-CM | POA: Diagnosis not present

## 2017-10-09 DIAGNOSIS — E1165 Type 2 diabetes mellitus with hyperglycemia: Secondary | ICD-10-CM | POA: Diagnosis not present

## 2017-10-09 DIAGNOSIS — F0150 Vascular dementia without behavioral disturbance: Secondary | ICD-10-CM | POA: Diagnosis not present

## 2017-10-09 DIAGNOSIS — G8929 Other chronic pain: Secondary | ICD-10-CM | POA: Diagnosis not present

## 2017-10-09 DIAGNOSIS — N400 Enlarged prostate without lower urinary tract symptoms: Secondary | ICD-10-CM | POA: Diagnosis not present

## 2017-10-09 DIAGNOSIS — Z794 Long term (current) use of insulin: Secondary | ICD-10-CM | POA: Diagnosis not present

## 2017-10-13 DIAGNOSIS — F0150 Vascular dementia without behavioral disturbance: Secondary | ICD-10-CM | POA: Diagnosis not present

## 2017-10-13 DIAGNOSIS — F330 Major depressive disorder, recurrent, mild: Secondary | ICD-10-CM | POA: Diagnosis not present

## 2017-10-13 DIAGNOSIS — G8929 Other chronic pain: Secondary | ICD-10-CM | POA: Diagnosis not present

## 2017-10-13 DIAGNOSIS — N400 Enlarged prostate without lower urinary tract symptoms: Secondary | ICD-10-CM | POA: Diagnosis not present

## 2017-10-13 DIAGNOSIS — Z794 Long term (current) use of insulin: Secondary | ICD-10-CM | POA: Diagnosis not present

## 2017-10-13 DIAGNOSIS — E1165 Type 2 diabetes mellitus with hyperglycemia: Secondary | ICD-10-CM | POA: Diagnosis not present

## 2017-10-20 DIAGNOSIS — N400 Enlarged prostate without lower urinary tract symptoms: Secondary | ICD-10-CM | POA: Diagnosis not present

## 2017-10-20 DIAGNOSIS — Z794 Long term (current) use of insulin: Secondary | ICD-10-CM | POA: Diagnosis not present

## 2017-10-20 DIAGNOSIS — G8929 Other chronic pain: Secondary | ICD-10-CM | POA: Diagnosis not present

## 2017-10-20 DIAGNOSIS — F330 Major depressive disorder, recurrent, mild: Secondary | ICD-10-CM | POA: Diagnosis not present

## 2017-10-20 DIAGNOSIS — F0150 Vascular dementia without behavioral disturbance: Secondary | ICD-10-CM | POA: Diagnosis not present

## 2017-10-20 DIAGNOSIS — E1165 Type 2 diabetes mellitus with hyperglycemia: Secondary | ICD-10-CM | POA: Diagnosis not present

## 2017-10-22 ENCOUNTER — Ambulatory Visit (INDEPENDENT_AMBULATORY_CARE_PROVIDER_SITE_OTHER): Payer: Medicare Other | Admitting: Neurology

## 2017-10-22 ENCOUNTER — Encounter: Payer: Self-pay | Admitting: Neurology

## 2017-10-22 ENCOUNTER — Other Ambulatory Visit: Payer: Self-pay

## 2017-10-22 VITALS — BP 116/60 | HR 82 | Ht 68.5 in | Wt 148.0 lb

## 2017-10-22 DIAGNOSIS — F03A0 Unspecified dementia, mild, without behavioral disturbance, psychotic disturbance, mood disturbance, and anxiety: Secondary | ICD-10-CM

## 2017-10-22 DIAGNOSIS — F0390 Unspecified dementia without behavioral disturbance: Secondary | ICD-10-CM

## 2017-10-22 MED ORDER — DONEPEZIL HCL 10 MG PO TABS: ORAL_TABLET | ORAL | 6 refills | Status: DC

## 2017-10-22 NOTE — Progress Notes (Signed)
NEUROLOGY CONSULTATION NOTE  Hayden [name] MRN: [id number] DOB: 1941-02-05  Referring provider: Dr. London Pepper Primary care provider: Dr. London Pepper  Reason for consult:  Memory loss  Dear Dr Orland Mustard:  Thank you for your kind referral of Hayden [name] for consultation of the above symptoms. Although his history is well known to you, please allow me to reiterate it for the purpose of our medical record. The patient was accompanied to the clinic by his caregiver who also provides collateral information. Records and images were personally reviewed where available.  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 He reports his memory is "shot." History is mostly obtained from Epic,patient is a poor historian but able to provide some history, his caregiver has been with him only for the past month. He reports that he stopped driving 1-2 years ago, he does recall getting lost driving a few times. He recalls an episode where he started walking to the bank and a family member saw him on the street and asked him to ride the car. It is unclear when this occurred. He is quite verbose and sometimes tangential, with loose associations. His son has been in charge of finances for the past 2-3 years, he does note he was forgetting bill payments. He states his son has an "iron grip on my life." He has caregivers staying 16 hours a day with him, fixing meals and checking he takes his medications. He takes his medications by himself, once in a while she helps. He is independent with dressing and bathing. His caregiver feels he may need someone with him at night, she thinks he is up at night. No clear wandering behavior. He has a history of insomnia "I have nothing but hard nights," Trazodone does not help with sleep much. He reports his son "unofficially bumped my dose of Ambien"  which is much for effective for him, he will discuss this with Dr. Orland Mustard. He was admitted to [facility] last month for altered mental status. 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 There was also note of increased signal and decreased size in the left posterior hippocampus. He denies any significant head injuries. He denies any seizure-like symptoms, no olfactory/gustatory hallucinations, focal numbness/tingling/weakness. His caregiver denies any staring/unresponsive episodes.   He is occasionally wobbly when he gets up. 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 He denies any neck/back pain. He has urinary incontinence and occasional bowel incontinence. He denies any headaches, diplopia, dysarthria/dysphagia, anosmia, or tremors. His caregiver denies any hallucinations or significant behavioral issues, he rarely gets cranky when tired/overworked or frustrated due to his memory. No family history of dementia. He denies alcohol intake.   Laboratory Data: Lab Results  Component Value Date   TSH 1.489 09/08/2017   Lab Results  Component Value Date   [id number] 625 09/07/2017     PAST MEDICAL HISTORY: Past Medical History:  Diagnosis Date  . Depression   . Diabetes mellitus   . Enlarged prostate   . Fibromyalgia   . GERD (gastroesophageal reflux disease)    reflux intermittent  . Hyperlipidemia   . Leukemia ([place]) chronic lymphocytic   2008 diagnosed-monitored Dr     PAST SURGICAL HISTORY: Past Surgical History:  Procedure Laterality Date  . ADENOIDECTOMY    .  CARPAL TUNNEL RELEASE     rt hand  . CATARACT EXTRACTION, BILATERAL    . CERVICAL FUSION  1998  . CYSTOSCOPY  07/26/2012   Procedure: CYSTOSCOPY;  Surgeon: Ailene Rud, MD;  Location: [facility];  Service: Urology;  Laterality: N/A;  DIAGNOSTIC CYSTO PROSTATE  ULTRASOUND      . PROSTATE BIOPSY  07/26/2012   Procedure: PROSTATE BIOPSY;  Surgeon: Ailene Rud, MD;  Location[facility];  Service: Urology;  Laterality: N/A;  . retinal micro aneurysms    . tonsil    . VASECTOMY    . VITRECTOMY      MEDICATIONS: Current Outpatient Medications on File Prior to Visit  Medication Sig Dispense Refill  . aspirin 81 MG tablet Take 81 mg by mouth at bedtime.     Marland Kitchen buPROPion (WELLBUTRIN) 100 MG tablet Take 100 mg by mouth 2 (two) times daily.     . Cholecalciferol (VITAMIN D3) 1000 UNITS CAPS Take 1 capsule by mouth at bedtime.    . ciprofloxacin (CIPRO) 500 MG tablet Take 1 tablet (500 mg total) by mouth 2 (two) times daily. 11 tablet 0  . fish oil-omega-3 fatty acids 1000 MG capsule Take 1 g by mouth 2 (two) times daily.     . insulin lispro (HUMALOG KWIKPEN) 100 UNIT/ML KiwkPen Inject 3-5 Units into the skin 3 (three) times daily before meals.     Marland Kitchen JANUVIA 100 MG tablet Take 100 mg by mouth daily.  0  . LEVEMIR FLEXTOUCH 100 UNIT/ML Pen Inject 8 Units into the skin at bedtime.  11  . lovastatin (MEVACOR) 20 MG tablet Take 20 mg by mouth at bedtime.    . tamsulosin (FLOMAX) 0.4 MG CAPS capsule Take 0.4 mg by mouth at bedtime.  11  . vitamin B-12 (CYANOCOBALAMIN) 1000 MCG tablet Take 1,000 mcg by mouth daily.    Marland Kitchen zolpidem (AMBIEN) 5 MG tablet Take 5 mg by mouth at bedtime as needed for sleep.     . [DISCONTINUED] calcium citrate-vitamin D (CITRACAL+D) 315-200 MG-UNIT per tablet Take 1 tablet by mouth daily.    . [DISCONTINUED] cetirizine (ZYRTEC) 10 MG tablet Take 10 mg by mouth daily.    . [DISCONTINUED] terazosin (HYTRIN) 5 MG capsule Take 5 mg by mouth at bedtime.     No current facility-administered medications on file prior to visit.     ALLERGIES: Allergies  Allergen Reactions  . Bee Venom Other (See Comments)    Bump where stung and took days to go away  . Glucophage [Metformin] Diarrhea    "i will never take it  again -stool incontinent "  . Shellfish Allergy Swelling    FAMILY HISTORY: Family History  Problem Relation Age of Onset  . Diabetes Father   . Diabetes Sister   . Diabetes Brother     SOCIAL HISTORY: Social History   Socioeconomic History  . Marital status: Married    Spouse name: Not on file  . Number of children: Not on file  . Years of education: Not on file  . Highest education level: Not on file  Occupational History  . Not on file  Social Needs  . Financial resource strain: Not on file  . Food insecurity:    Worry: Not on file    Inability: Not on file  . Transportation needs:    Medical: Not on file    Non-medical: Not on file  Tobacco Use  . Smoking status: Never  Smoker  . Smokeless tobacco: Never Used  Substance and Sexual Activity  . Alcohol use: No    Frequency: Never  . Drug use: No  . Sexual activity: Not on file  Lifestyle  . Physical activity:    Days per week: Not on file    Minutes per session: Not on file  . Stress: Not on file  Relationships  . Social connections:    Talks on phone: Not on file    Gets together: Not on file    Attends religious service: Not on file    Active member of club or organization: Not on file    Attends meetings of clubs or organizations: Not on file    Relationship status: Not on file  . Intimate partner violence:    Fear of current or ex partner: Not on file    Emotionally abused: Not on file    Physically abused: Not on file    Forced sexual activity: Not on file  Other Topics Concern  . Not on file  Social History Narrative  . Not on file    REVIEW OF SYSTEMS: Constitutional: No fevers, chills, or sweats, no generalized fatigue, change in appetite Eyes: No visual changes, double vision, eye pain Ear, nose and throat: No hearing loss, ear pain, nasal congestion, sore throat Cardiovascular: No chest pain, palpitations Respiratory:  No shortness of breath at rest or with exertion,  wheezes GastrointestinaI: No nausea, vomiting, diarrhea, abdominal pain, fecal incontinence Genitourinary:  No dysuria, urinary retention or frequency Musculoskeletal:  No neck pain, back pain Integumentary: No rash, pruritus, skin lesions Neurological: as above Psychiatric: No depression, insomnia, anxiety Endocrine: No palpitations, fatigue, diaphoresis, mood swings, change in appetite, change in weight, increased thirst Hematologic/Lymphatic:  No anemia, purpura, petechiae. Allergic/Immunologic: no itchy/runny eyes, nasal congestion, recent allergic reactions, rashes  PHYSICAL EXAM: Vitals:   10/22/17 1032  BP: 116/60  Pulse: 82  SpO2: 98%   General: No acute distress Head:  Normocephalic/atraumatic Eyes: Fundoscopic exam shows bilateral sharp discs, no vessel changes, exudates, or hemorrhages Neck: supple, no paraspinal tenderness, full range of motion Back: No paraspinal tenderness Heart: regular rate and rhythm Lungs: Clear to auscultation bilaterally. Vascular: No carotid bruits. Skin/Extremities: No rash, no edema Neurological Exam: Mental status: alert and oriented to person, place, and time, no dysarthria or aphasia, Fund of knowledge is appropriate.  Recent and remote memory are impaired.  Attention and concentration are normal.    Able to name objects and repeat phrases. CDT 4/5  MMSE - Mini Mental State Exam 10/22/2017  Orientation to time 4  Orientation to Place 4  Registration 3  Attention/ Calculation 5  Recall 0  Language- name 2 objects 2  Language- repeat 1  Language- follow 3 step command 3  Language- read & follow direction 1  Write a sentence 1  Copy design 1  Total score 25   Cranial nerves: CN I: not tested CN II: pupils equal, round and reactive to light, visual fields intact, fundi unremarkable. CN III, IV, VI:  full range of motion, no nystagmus, no ptosis CN V: facial sensation intact CN VII: upper and lower face symmetric CN VIII: hearing  intact to finger rub CN IX, X: gag intact, uvula midline CN XI: sternocleidomastoid and trapezius muscles intact CN XII: tongue midline Bulk & Tone: normal, no fasciculations. Motor: 5/5 throughout with no pronator drift. Sensation: intact to light touch, cold, pin, vibration and joint position sense.  No extinction to double simultaneous  stimulation.  Romberg test negative Deep Tendon Reflexes: +1 throughout, no ankle clonus Plantar responses: downgoing bilaterally Cerebellar: no incoordination on finger to nose, heel to shin. No dysdiadochokinesia Gait: narrow-based and steady, able to tandem walk adequately. Tremor: none  IMPRESSION: This is a pleasant 77 year old right-handed man with a history of  hyperlipidemia, diabetes, CLL, presenting for evaluation of dementia. He is verbose and somewhat tangential in the office today, but able to answer questions appropriately, with some loose associations. His neurological exam is non-focal, MMSE today 25/30. History suggestive of mild dementia, likely due to Alzheimer's disease. MRI brain shows moderate chronic microvascular disease. We discussed starting Donepezil, side effects and expectations from the medication were discussed. Continue with close home supervision. He does not drive. He will follow-up in 6 months and knows to call for any changes.   Thank you for allowing me to participate in the care of this patient. Please do not hesitate to call for any questions or concerns.   Ellouise Newer, M.D.  CC: Dr. Orland Mustard

## 2017-10-22 NOTE — Patient Instructions (Signed)
1. Start Aricept (Donepezil) 10mg : take 1/2 tablet daily for 2 weeks, then increase to 1 tablet daily 2. Continue 24/7 care 3. Follow-up in 6 months, call for any changes  FALL PRECAUTIONS: Be cautious when walking. Scan the area for obstacles that may increase the risk of trips and falls. When getting up in the mornings, sit up at the edge of the bed for a few minutes before getting out of bed. Consider elevating the bed at the head end to avoid drop of blood pressure when getting up. Walk always in a well-lit room (use night lights in the walls). Avoid area rugs or power cords from appliances in the middle of the walkways. Use a walker or a cane if necessary and consider physical therapy for balance exercise. Get your eyesight checked regularly.  FINANCIAL OVERSIGHT: Supervision, especially oversight when making financial decisions or transactions is also recommended.  HOME SAFETY: Consider the safety of the kitchen when operating appliances like stoves, microwave oven, and blender. Consider having supervision and share cooking responsibilities until no longer able to participate in those. Accidents with firearms and other hazards in the house should be identified and addressed as well.  DRIVING: Regarding driving, in patients with progressive memory problems, driving will be impaired. We advise to have someone else do the driving if trouble finding directions or if minor accidents are reported. Independent driving assessment is available to determine safety of driving.  ABILITY TO BE LEFT ALONE: If patient is unable to contact 911 operator, consider using LifeLine, or when the need is there, arrange for someone to stay with patients. Smoking is a fire hazard, consider supervision or cessation. Risk of wandering should be assessed by caregiver and if detected at any point, supervision and safe proof recommendations should be instituted.  MEDICATION SUPERVISION: Inability to self-administer medication  needs to be constantly addressed. Implement a mechanism to ensure safe administration of the medications.  RECOMMENDATIONS FOR ALL PATIENTS WITH MEMORY PROBLEMS: 1. Continue to exercise (Recommend 30 minutes of walking everyday, or 3 hours every week) 2. Increase social interactions - continue going to [place] and enjoy social gatherings with friends and family 3. Eat healthy, avoid fried foods and eat more fruits and vegetables 4. Maintain adequate blood pressure, blood sugar, and blood cholesterol level. Reducing the risk of stroke and cardiovascular disease also helps promoting better memory. 5. Avoid stressful situations. Live a simple life and avoid aggravations. Organize your time and prepare for the next day in anticipation. 6. Sleep well, avoid any interruptions of sleep and avoid any distractions in the bedroom that may interfere with adequate sleep quality 7. Avoid sugar, avoid sweets as there is a strong link between excessive sugar intake, diabetes, and cognitive impairment The Mediterranean diet has been shown to help patients reduce the risk of progressive memory disorders and reduces cardiovascular risk. This includes eating fish, eat fruits and green leafy vegetables, nuts like almonds and hazelnuts, walnuts, and also use olive oil. Avoid fast foods and fried foods as much as possible. Avoid sweets and sugar as sugar use has been linked to worsening of memory function.  There is always a concern of gradual progression of memory problems. If this is the case, then we may need to adjust level of care according to patient needs. Support, both to the patient and caregiver, should then be put into place.

## 2017-10-23 DIAGNOSIS — F03A0 Unspecified dementia, mild, without behavioral disturbance, psychotic disturbance, mood disturbance, and anxiety: Secondary | ICD-10-CM | POA: Insufficient documentation

## 2017-10-23 DIAGNOSIS — F0390 Unspecified dementia without behavioral disturbance: Secondary | ICD-10-CM | POA: Insufficient documentation

## 2017-10-26 DIAGNOSIS — E1165 Type 2 diabetes mellitus with hyperglycemia: Secondary | ICD-10-CM | POA: Diagnosis not present

## 2017-10-26 DIAGNOSIS — Z794 Long term (current) use of insulin: Secondary | ICD-10-CM | POA: Diagnosis not present

## 2017-10-26 DIAGNOSIS — F330 Major depressive disorder, recurrent, mild: Secondary | ICD-10-CM | POA: Diagnosis not present

## 2017-10-26 DIAGNOSIS — G8929 Other chronic pain: Secondary | ICD-10-CM | POA: Diagnosis not present

## 2017-10-26 DIAGNOSIS — N400 Enlarged prostate without lower urinary tract symptoms: Secondary | ICD-10-CM | POA: Diagnosis not present

## 2017-10-26 DIAGNOSIS — F0150 Vascular dementia without behavioral disturbance: Secondary | ICD-10-CM | POA: Diagnosis not present

## 2017-10-30 DIAGNOSIS — G8929 Other chronic pain: Secondary | ICD-10-CM | POA: Diagnosis not present

## 2017-10-30 DIAGNOSIS — F0150 Vascular dementia without behavioral disturbance: Secondary | ICD-10-CM | POA: Diagnosis not present

## 2017-10-30 DIAGNOSIS — Z794 Long term (current) use of insulin: Secondary | ICD-10-CM | POA: Diagnosis not present

## 2017-10-30 DIAGNOSIS — N400 Enlarged prostate without lower urinary tract symptoms: Secondary | ICD-10-CM | POA: Diagnosis not present

## 2017-10-30 DIAGNOSIS — F330 Major depressive disorder, recurrent, mild: Secondary | ICD-10-CM | POA: Diagnosis not present

## 2017-10-30 DIAGNOSIS — E1165 Type 2 diabetes mellitus with hyperglycemia: Secondary | ICD-10-CM | POA: Diagnosis not present

## 2017-11-02 DIAGNOSIS — E1165 Type 2 diabetes mellitus with hyperglycemia: Secondary | ICD-10-CM | POA: Diagnosis not present

## 2017-11-02 DIAGNOSIS — E1142 Type 2 diabetes mellitus with diabetic polyneuropathy: Secondary | ICD-10-CM | POA: Diagnosis not present

## 2017-11-02 DIAGNOSIS — Z794 Long term (current) use of insulin: Secondary | ICD-10-CM | POA: Diagnosis not present

## 2017-11-03 DIAGNOSIS — N400 Enlarged prostate without lower urinary tract symptoms: Secondary | ICD-10-CM | POA: Diagnosis not present

## 2017-11-03 DIAGNOSIS — G8929 Other chronic pain: Secondary | ICD-10-CM | POA: Diagnosis not present

## 2017-11-03 DIAGNOSIS — Z794 Long term (current) use of insulin: Secondary | ICD-10-CM | POA: Diagnosis not present

## 2017-11-03 DIAGNOSIS — F0150 Vascular dementia without behavioral disturbance: Secondary | ICD-10-CM | POA: Diagnosis not present

## 2017-11-03 DIAGNOSIS — E1165 Type 2 diabetes mellitus with hyperglycemia: Secondary | ICD-10-CM | POA: Diagnosis not present

## 2017-11-03 DIAGNOSIS — F330 Major depressive disorder, recurrent, mild: Secondary | ICD-10-CM | POA: Diagnosis not present

## 2017-11-10 DIAGNOSIS — R413 Other amnesia: Secondary | ICD-10-CM | POA: Diagnosis not present

## 2017-11-10 DIAGNOSIS — F418 Other specified anxiety disorders: Secondary | ICD-10-CM | POA: Diagnosis not present

## 2017-11-10 DIAGNOSIS — E11319 Type 2 diabetes mellitus with unspecified diabetic retinopathy without macular edema: Secondary | ICD-10-CM | POA: Diagnosis not present

## 2017-11-11 ENCOUNTER — Telehealth: Payer: Self-pay | Admitting: Neurology

## 2017-11-11 DIAGNOSIS — F0150 Vascular dementia without behavioral disturbance: Secondary | ICD-10-CM | POA: Diagnosis not present

## 2017-11-11 DIAGNOSIS — F330 Major depressive disorder, recurrent, mild: Secondary | ICD-10-CM | POA: Diagnosis not present

## 2017-11-11 DIAGNOSIS — N400 Enlarged prostate without lower urinary tract symptoms: Secondary | ICD-10-CM | POA: Diagnosis not present

## 2017-11-11 DIAGNOSIS — E1165 Type 2 diabetes mellitus with hyperglycemia: Secondary | ICD-10-CM | POA: Diagnosis not present

## 2017-11-11 DIAGNOSIS — Z794 Long term (current) use of insulin: Secondary | ICD-10-CM | POA: Diagnosis not present

## 2017-11-11 DIAGNOSIS — G8929 Other chronic pain: Secondary | ICD-10-CM | POA: Diagnosis not present

## 2017-11-11 NOTE — Telephone Encounter (Signed)
Verbally advised to disregard this message by [facility].

## 2017-11-11 NOTE — Telephone Encounter (Signed)
Patient's brother called and is needing the last note from Dr. Amparo Bristol visit with Hayden [name] on 10/22/17. He is needing to know what was discussed and what is needed to at this point with care. Should the last office note be mailed? Please Call. Thanks

## 2017-11-17 DIAGNOSIS — F411 Generalized anxiety disorder: Secondary | ICD-10-CM | POA: Diagnosis not present

## 2017-12-03 DIAGNOSIS — G4700 Insomnia, unspecified: Secondary | ICD-10-CM | POA: Diagnosis not present

## 2017-12-03 DIAGNOSIS — R413 Other amnesia: Secondary | ICD-10-CM | POA: Diagnosis not present

## 2017-12-03 DIAGNOSIS — E11319 Type 2 diabetes mellitus with unspecified diabetic retinopathy without macular edema: Secondary | ICD-10-CM | POA: Diagnosis not present

## 2017-12-03 DIAGNOSIS — N3281 Overactive bladder: Secondary | ICD-10-CM | POA: Diagnosis not present

## 2017-12-04 DIAGNOSIS — Z111 Encounter for screening for respiratory tuberculosis: Secondary | ICD-10-CM | POA: Diagnosis not present

## 2017-12-18 DIAGNOSIS — H40023 Open angle with borderline findings, high risk, bilateral: Secondary | ICD-10-CM | POA: Diagnosis not present

## 2017-12-18 DIAGNOSIS — E119 Type 2 diabetes mellitus without complications: Secondary | ICD-10-CM | POA: Diagnosis not present

## 2018-02-15 DIAGNOSIS — E11319 Type 2 diabetes mellitus with unspecified diabetic retinopathy without macular edema: Secondary | ICD-10-CM | POA: Diagnosis not present

## 2018-02-15 DIAGNOSIS — E7800 Pure hypercholesterolemia, unspecified: Secondary | ICD-10-CM | POA: Diagnosis not present

## 2018-02-15 DIAGNOSIS — G629 Polyneuropathy, unspecified: Secondary | ICD-10-CM | POA: Diagnosis not present

## 2018-02-15 DIAGNOSIS — F418 Other specified anxiety disorders: Secondary | ICD-10-CM | POA: Diagnosis not present

## 2018-02-15 DIAGNOSIS — I10 Essential (primary) hypertension: Secondary | ICD-10-CM | POA: Diagnosis not present

## 2018-02-15 DIAGNOSIS — R413 Other amnesia: Secondary | ICD-10-CM | POA: Diagnosis not present

## 2018-02-15 DIAGNOSIS — G4700 Insomnia, unspecified: Secondary | ICD-10-CM | POA: Diagnosis not present

## 2018-02-17 DIAGNOSIS — E11319 Type 2 diabetes mellitus with unspecified diabetic retinopathy without macular edema: Secondary | ICD-10-CM | POA: Diagnosis not present

## 2018-02-17 DIAGNOSIS — Z79899 Other long term (current) drug therapy: Secondary | ICD-10-CM | POA: Diagnosis not present

## 2018-02-17 DIAGNOSIS — G3184 Mild cognitive impairment, so stated: Secondary | ICD-10-CM | POA: Diagnosis not present

## 2018-02-17 DIAGNOSIS — Z794 Long term (current) use of insulin: Secondary | ICD-10-CM | POA: Diagnosis not present

## 2018-02-17 DIAGNOSIS — G4700 Insomnia, unspecified: Secondary | ICD-10-CM | POA: Diagnosis not present

## 2018-02-17 DIAGNOSIS — E1169 Type 2 diabetes mellitus with other specified complication: Secondary | ICD-10-CM | POA: Diagnosis not present

## 2018-02-17 DIAGNOSIS — E1142 Type 2 diabetes mellitus with diabetic polyneuropathy: Secondary | ICD-10-CM | POA: Diagnosis not present

## 2018-02-17 DIAGNOSIS — I10 Essential (primary) hypertension: Secondary | ICD-10-CM | POA: Diagnosis not present

## 2018-02-17 DIAGNOSIS — E559 Vitamin D deficiency, unspecified: Secondary | ICD-10-CM | POA: Diagnosis not present

## 2018-02-17 DIAGNOSIS — F321 Major depressive disorder, single episode, moderate: Secondary | ICD-10-CM | POA: Diagnosis not present

## 2018-02-17 DIAGNOSIS — D519 Vitamin B12 deficiency anemia, unspecified: Secondary | ICD-10-CM | POA: Diagnosis not present

## 2018-02-17 DIAGNOSIS — E7800 Pure hypercholesterolemia, unspecified: Secondary | ICD-10-CM | POA: Diagnosis not present

## 2018-02-17 DIAGNOSIS — G629 Polyneuropathy, unspecified: Secondary | ICD-10-CM | POA: Diagnosis not present

## 2018-02-17 DIAGNOSIS — F418 Other specified anxiety disorders: Secondary | ICD-10-CM | POA: Diagnosis not present

## 2018-02-17 DIAGNOSIS — Z7982 Long term (current) use of aspirin: Secondary | ICD-10-CM | POA: Diagnosis not present

## 2018-02-23 DIAGNOSIS — G3184 Mild cognitive impairment, so stated: Secondary | ICD-10-CM | POA: Diagnosis not present

## 2018-02-23 DIAGNOSIS — E1142 Type 2 diabetes mellitus with diabetic polyneuropathy: Secondary | ICD-10-CM | POA: Diagnosis not present

## 2018-02-23 DIAGNOSIS — F418 Other specified anxiety disorders: Secondary | ICD-10-CM | POA: Diagnosis not present

## 2018-02-23 DIAGNOSIS — E11319 Type 2 diabetes mellitus with unspecified diabetic retinopathy without macular edema: Secondary | ICD-10-CM | POA: Diagnosis not present

## 2018-02-23 DIAGNOSIS — I10 Essential (primary) hypertension: Secondary | ICD-10-CM | POA: Diagnosis not present

## 2018-02-23 DIAGNOSIS — E1169 Type 2 diabetes mellitus with other specified complication: Secondary | ICD-10-CM | POA: Diagnosis not present

## 2018-02-26 DIAGNOSIS — E1169 Type 2 diabetes mellitus with other specified complication: Secondary | ICD-10-CM | POA: Diagnosis not present

## 2018-02-26 DIAGNOSIS — I10 Essential (primary) hypertension: Secondary | ICD-10-CM | POA: Diagnosis not present

## 2018-02-26 DIAGNOSIS — E1142 Type 2 diabetes mellitus with diabetic polyneuropathy: Secondary | ICD-10-CM | POA: Diagnosis not present

## 2018-02-26 DIAGNOSIS — E11319 Type 2 diabetes mellitus with unspecified diabetic retinopathy without macular edema: Secondary | ICD-10-CM | POA: Diagnosis not present

## 2018-02-26 DIAGNOSIS — G3184 Mild cognitive impairment, so stated: Secondary | ICD-10-CM | POA: Diagnosis not present

## 2018-02-26 DIAGNOSIS — F418 Other specified anxiety disorders: Secondary | ICD-10-CM | POA: Diagnosis not present

## 2018-03-01 DIAGNOSIS — E1169 Type 2 diabetes mellitus with other specified complication: Secondary | ICD-10-CM | POA: Diagnosis not present

## 2018-03-01 DIAGNOSIS — G3184 Mild cognitive impairment, so stated: Secondary | ICD-10-CM | POA: Diagnosis not present

## 2018-03-01 DIAGNOSIS — I10 Essential (primary) hypertension: Secondary | ICD-10-CM | POA: Diagnosis not present

## 2018-03-01 DIAGNOSIS — E1142 Type 2 diabetes mellitus with diabetic polyneuropathy: Secondary | ICD-10-CM | POA: Diagnosis not present

## 2018-03-01 DIAGNOSIS — E11319 Type 2 diabetes mellitus with unspecified diabetic retinopathy without macular edema: Secondary | ICD-10-CM | POA: Diagnosis not present

## 2018-03-01 DIAGNOSIS — F418 Other specified anxiety disorders: Secondary | ICD-10-CM | POA: Diagnosis not present

## 2018-03-08 ENCOUNTER — Inpatient Hospital Stay (HOSPITAL_COMMUNITY)
Admission: EM | Admit: 2018-03-08 | Discharge: 2018-03-11 | DRG: 728 | Disposition: A | Discharge status: Skilled Nursing Facility | Payer: Medicare Other | Attending: Internal Medicine | Admitting: Internal Medicine

## 2018-03-08 ENCOUNTER — Encounter (HOSPITAL_COMMUNITY): Payer: Self-pay

## 2018-03-08 ENCOUNTER — Other Ambulatory Visit: Payer: Self-pay

## 2018-03-08 DIAGNOSIS — Z91030 Bee allergy status: Secondary | ICD-10-CM

## 2018-03-08 DIAGNOSIS — Z87440 Personal history of urinary (tract) infections: Secondary | ICD-10-CM

## 2018-03-08 DIAGNOSIS — A6002 Herpesviral infection of other male genital organs: Secondary | ICD-10-CM | POA: Diagnosis not present

## 2018-03-08 DIAGNOSIS — Z961 Presence of intraocular lens: Secondary | ICD-10-CM | POA: Diagnosis present

## 2018-03-08 DIAGNOSIS — C9110 Chronic lymphocytic leukemia of B-cell type not having achieved remission: Secondary | ICD-10-CM | POA: Diagnosis present

## 2018-03-08 DIAGNOSIS — F0390 Unspecified dementia without behavioral disturbance: Secondary | ICD-10-CM | POA: Diagnosis present

## 2018-03-08 DIAGNOSIS — N499 Inflammatory disorder of unspecified male genital organ: Secondary | ICD-10-CM | POA: Diagnosis present

## 2018-03-08 DIAGNOSIS — K219 Gastro-esophageal reflux disease without esophagitis: Secondary | ICD-10-CM | POA: Diagnosis present

## 2018-03-08 DIAGNOSIS — Z91013 Allergy to seafood: Secondary | ICD-10-CM

## 2018-03-08 DIAGNOSIS — Z794 Long term (current) use of insulin: Secondary | ICD-10-CM | POA: Diagnosis not present

## 2018-03-08 DIAGNOSIS — E860 Dehydration: Secondary | ICD-10-CM | POA: Diagnosis present

## 2018-03-08 DIAGNOSIS — Z9842 Cataract extraction status, left eye: Secondary | ICD-10-CM

## 2018-03-08 DIAGNOSIS — R4182 Altered mental status, unspecified: Secondary | ICD-10-CM | POA: Diagnosis present

## 2018-03-08 DIAGNOSIS — E785 Hyperlipidemia, unspecified: Secondary | ICD-10-CM | POA: Diagnosis present

## 2018-03-08 DIAGNOSIS — N5082 Scrotal pain: Secondary | ICD-10-CM | POA: Diagnosis not present

## 2018-03-08 DIAGNOSIS — N485 Ulcer of penis: Secondary | ICD-10-CM | POA: Diagnosis present

## 2018-03-08 DIAGNOSIS — N492 Inflammatory disorders of scrotum: Secondary | ICD-10-CM | POA: Diagnosis not present

## 2018-03-08 DIAGNOSIS — F329 Major depressive disorder, single episode, unspecified: Secondary | ICD-10-CM | POA: Diagnosis present

## 2018-03-08 DIAGNOSIS — E119 Type 2 diabetes mellitus without complications: Secondary | ICD-10-CM | POA: Diagnosis present

## 2018-03-08 DIAGNOSIS — F32A Depression, unspecified: Secondary | ICD-10-CM | POA: Diagnosis present

## 2018-03-08 DIAGNOSIS — D649 Anemia, unspecified: Secondary | ICD-10-CM | POA: Diagnosis present

## 2018-03-08 DIAGNOSIS — Z23 Encounter for immunization: Secondary | ICD-10-CM

## 2018-03-08 DIAGNOSIS — F418 Other specified anxiety disorders: Secondary | ICD-10-CM | POA: Diagnosis not present

## 2018-03-08 DIAGNOSIS — N400 Enlarged prostate without lower urinary tract symptoms: Secondary | ICD-10-CM | POA: Diagnosis present

## 2018-03-08 DIAGNOSIS — Z7982 Long term (current) use of aspirin: Secondary | ICD-10-CM

## 2018-03-08 DIAGNOSIS — C9190 Lymphoid leukemia, unspecified not having achieved remission: Secondary | ICD-10-CM | POA: Diagnosis not present

## 2018-03-08 DIAGNOSIS — M797 Fibromyalgia: Secondary | ICD-10-CM | POA: Diagnosis present

## 2018-03-08 DIAGNOSIS — L03818 Cellulitis of other sites: Secondary | ICD-10-CM

## 2018-03-08 DIAGNOSIS — F03A0 Unspecified dementia, mild, without behavioral disturbance, psychotic disturbance, mood disturbance, and anxiety: Secondary | ICD-10-CM | POA: Diagnosis present

## 2018-03-08 DIAGNOSIS — E118 Type 2 diabetes mellitus with unspecified complications: Secondary | ICD-10-CM | POA: Diagnosis not present

## 2018-03-08 DIAGNOSIS — R5381 Other malaise: Secondary | ICD-10-CM | POA: Diagnosis not present

## 2018-03-08 DIAGNOSIS — Z981 Arthrodesis status: Secondary | ICD-10-CM

## 2018-03-08 DIAGNOSIS — N4822 Cellulitis of corpus cavernosum and penis: Secondary | ICD-10-CM | POA: Diagnosis not present

## 2018-03-08 DIAGNOSIS — E11319 Type 2 diabetes mellitus with unspecified diabetic retinopathy without macular edema: Secondary | ICD-10-CM | POA: Diagnosis not present

## 2018-03-08 DIAGNOSIS — Z888 Allergy status to other drugs, medicaments and biological substances status: Secondary | ICD-10-CM

## 2018-03-08 DIAGNOSIS — Z833 Family history of diabetes mellitus: Secondary | ICD-10-CM

## 2018-03-08 DIAGNOSIS — Z9841 Cataract extraction status, right eye: Secondary | ICD-10-CM

## 2018-03-08 DIAGNOSIS — N401 Enlarged prostate with lower urinary tract symptoms: Secondary | ICD-10-CM | POA: Diagnosis not present

## 2018-03-08 DIAGNOSIS — I10 Essential (primary) hypertension: Secondary | ICD-10-CM | POA: Diagnosis not present

## 2018-03-08 DIAGNOSIS — D72829 Elevated white blood cell count, unspecified: Secondary | ICD-10-CM | POA: Diagnosis present

## 2018-03-08 DIAGNOSIS — Z79899 Other long term (current) drug therapy: Secondary | ICD-10-CM

## 2018-03-08 DIAGNOSIS — Z791 Long term (current) use of non-steroidal anti-inflammatories (NSAID): Secondary | ICD-10-CM

## 2018-03-08 DIAGNOSIS — R350 Frequency of micturition: Secondary | ICD-10-CM | POA: Diagnosis present

## 2018-03-08 LAB — GLUCOSE, CAPILLARY: Glucose-Capillary: 115 mg/dL — ABNORMAL HIGH (ref 70–99)

## 2018-03-08 LAB — URINALYSIS, ROUTINE W REFLEX MICROSCOPIC
Bilirubin Urine: NEGATIVE
Glucose, UA: NEGATIVE mg/dL
KETONES UR: NEGATIVE mg/dL
Nitrite: POSITIVE — AB
PROTEIN: NEGATIVE mg/dL
Specific Gravity, Urine: 1.014 (ref 1.005–1.030)
pH: 6 (ref 5.0–8.0)

## 2018-03-08 LAB — CBC
HCT: 40.3 % (ref 39.0–52.0)
Hemoglobin: 13.9 g/dL (ref 13.0–17.0)
MCH: 31.8 pg (ref 26.0–34.0)
MCHC: 34.5 g/dL (ref 30.0–36.0)
MCV: 92.2 fL (ref 78.0–100.0)
PLATELETS: 237 10*3/uL (ref 150–400)
RBC: 4.37 MIL/uL (ref 4.22–5.81)
RDW: 12.4 % (ref 11.5–15.5)
WBC: 16 10*3/uL — AB (ref 4.0–10.5)

## 2018-03-08 LAB — SEDIMENTATION RATE: Sed Rate: 5 mm/hr (ref 0–16)

## 2018-03-08 LAB — COMPREHENSIVE METABOLIC PANEL
ALT: 28 U/L (ref 0–44)
AST: 27 U/L (ref 15–41)
Albumin: 4.2 g/dL (ref 3.5–5.0)
Alkaline Phosphatase: 67 U/L (ref 38–126)
Anion gap: 11 (ref 5–15)
BUN: 28 mg/dL — AB (ref 8–23)
CALCIUM: 9.9 mg/dL (ref 8.9–10.3)
CHLORIDE: 102 mmol/L (ref 98–111)
CO2: 25 mmol/L (ref 22–32)
Creatinine, Ser: 0.92 mg/dL (ref 0.61–1.24)
GFR calc non Af Amer: >60 mL/min (ref >60)
Glucose, Bld: 136 mg/dL — ABNORMAL HIGH (ref 70–99)
Potassium: 3.8 mmol/L (ref 3.5–5.1)
Sodium: 138 mmol/L (ref 135–145)
TOTAL PROTEIN: 6.8 g/dL (ref 6.5–8.1)
Total Bilirubin: 0.7 mg/dL (ref 0.3–1.2)

## 2018-03-08 LAB — C-REACTIVE PROTEIN: CRP: <0.8 mg/dL (ref <1.0)

## 2018-03-08 MED ORDER — ACETAMINOPHEN 650 MG RE SUPP: 650.0000 mg | Freq: Four times a day (QID) | RECTAL | Status: DC | PRN

## 2018-03-08 MED ORDER — VANCOMYCIN HCL IN DEXTROSE 1-5 GM/200ML-% IV SOLN
1000.0000 mg | INTRAVENOUS | Status: DC
  Administered 2018-03-09 – 2018-03-10 (×2): 1000 mg via INTRAVENOUS
  Filled 2018-03-08 (×4): qty 200

## 2018-03-08 MED ORDER — ONDANSETRON HCL 4 MG PO TABS: 4.0000 mg | ORAL_TABLET | Freq: Four times a day (QID) | ORAL | Status: DC | PRN

## 2018-03-08 MED ORDER — INFLUENZA VAC SPLIT HIGH-DOSE 0.5 ML IM SUSY
0.5000 mL | PREFILLED_SYRINGE | INTRAMUSCULAR | Status: AC
  Administered 2018-03-09: 0.5 mL via INTRAMUSCULAR
  Filled 2018-03-08: qty 0.5

## 2018-03-08 MED ORDER — BUPROPION HCL ER (XL) 150 MG PO TB24
300.0000 mg | ORAL_TABLET | Freq: Every day | ORAL | Status: DC
  Administered 2018-03-09 – 2018-03-11 (×3): 300 mg via ORAL
  Filled 2018-03-08 (×3): qty 2

## 2018-03-08 MED ORDER — QUETIAPINE FUMARATE 25 MG PO TABS
12.5000 mg | ORAL_TABLET | Freq: Two times a day (BID) | ORAL | Status: DC
  Administered 2018-03-08 – 2018-03-11 (×6): 12.5 mg via ORAL
  Filled 2018-03-08 (×6): qty 1

## 2018-03-08 MED ORDER — HYDRALAZINE HCL 20 MG/ML IJ SOLN: 10.0000 mg | INTRAMUSCULAR | Status: DC | PRN

## 2018-03-08 MED ORDER — HYDROCODONE-ACETAMINOPHEN 5-325 MG PO TABS: 1.0000 | ORAL_TABLET | ORAL | Status: DC | PRN

## 2018-03-08 MED ORDER — INSULIN DETEMIR 100 UNIT/ML ~~LOC~~ SOLN
30.0000 [IU] | Freq: Every day | SUBCUTANEOUS | Status: DC
  Administered 2018-03-09 – 2018-03-10 (×2): 30 [IU] via SUBCUTANEOUS
  Filled 2018-03-08 (×3): qty 0.3

## 2018-03-08 MED ORDER — ACETAMINOPHEN 325 MG PO TABS: 650.0000 mg | ORAL_TABLET | Freq: Four times a day (QID) | ORAL | Status: DC | PRN

## 2018-03-08 MED ORDER — SODIUM CHLORIDE 0.9 % IV BOLUS
1000.0000 mL | Freq: Once | INTRAVENOUS | Status: AC
  Administered 2018-03-08: 1000 mL via INTRAVENOUS

## 2018-03-08 MED ORDER — ONDANSETRON HCL 4 MG/2ML IJ SOLN: 4.0000 mg | Freq: Four times a day (QID) | INTRAMUSCULAR | Status: DC | PRN

## 2018-03-08 MED ORDER — PRAVASTATIN SODIUM 20 MG PO TABS
20.0000 mg | ORAL_TABLET | Freq: Every day | ORAL | Status: DC
  Administered 2018-03-09 – 2018-03-10 (×2): 20 mg via ORAL
  Filled 2018-03-08 (×2): qty 1

## 2018-03-08 MED ORDER — VALACYCLOVIR HCL 500 MG PO TABS
1000.0000 mg | ORAL_TABLET | Freq: Three times a day (TID) | ORAL | Status: DC
  Administered 2018-03-08 – 2018-03-11 (×9): 1000 mg via ORAL
  Filled 2018-03-08 (×10): qty 2

## 2018-03-08 MED ORDER — HYDROMORPHONE HCL 1 MG/ML IJ SOLN
0.5000 mg | Freq: Once | INTRAMUSCULAR | Status: AC
  Administered 2018-03-08: 0.5 mg via INTRAVENOUS
  Filled 2018-03-08: qty 1

## 2018-03-08 MED ORDER — SODIUM CHLORIDE 0.9 % IV SOLN
2.0000 g | Freq: Once | INTRAVENOUS | Status: AC
  Administered 2018-03-08: 2 g via INTRAVENOUS
  Filled 2018-03-08: qty 20

## 2018-03-08 MED ORDER — ENSURE ENLIVE PO LIQD
237.0000 mL | Freq: Two times a day (BID) | ORAL | Status: DC
  Administered 2018-03-09 (×2): 237 mL via ORAL

## 2018-03-08 MED ORDER — SODIUM CHLORIDE 0.9 % IV SOLN
1.0000 g | INTRAVENOUS | Status: DC
  Administered 2018-03-09 – 2018-03-10 (×2): 1 g via INTRAVENOUS
  Filled 2018-03-08 (×2): qty 1

## 2018-03-08 MED ORDER — ENOXAPARIN SODIUM 40 MG/0.4ML ~~LOC~~ SOLN
40.0000 mg | SUBCUTANEOUS | Status: DC
  Administered 2018-03-08 – 2018-03-10 (×3): 40 mg via SUBCUTANEOUS
  Filled 2018-03-08 (×3): qty 0.4

## 2018-03-08 MED ORDER — VANCOMYCIN HCL IN DEXTROSE 1-5 GM/200ML-% IV SOLN
1000.0000 mg | Freq: Once | INTRAVENOUS | Status: AC
  Administered 2018-03-08: 1000 mg via INTRAVENOUS
  Filled 2018-03-08: qty 200

## 2018-03-08 MED ORDER — INSULIN ASPART 100 UNIT/ML ~~LOC~~ SOLN
0.0000 [IU] | Freq: Three times a day (TID) | SUBCUTANEOUS | Status: DC
  Administered 2018-03-09: 5 [IU] via SUBCUTANEOUS
  Administered 2018-03-09: 1 [IU] via SUBCUTANEOUS
  Administered 2018-03-09: 7 [IU] via SUBCUTANEOUS
  Administered 2018-03-10: 2 [IU] via SUBCUTANEOUS
  Administered 2018-03-10: 1 [IU] via SUBCUTANEOUS
  Administered 2018-03-10 – 2018-03-11 (×3): 3 [IU] via SUBCUTANEOUS

## 2018-03-08 MED ORDER — TAMSULOSIN HCL 0.4 MG PO CAPS
0.4000 mg | ORAL_CAPSULE | Freq: Every day | ORAL | Status: DC
  Administered 2018-03-08 – 2018-03-10 (×3): 0.4 mg via ORAL
  Filled 2018-03-08 (×3): qty 1

## 2018-03-08 MED ORDER — TRAZODONE HCL 50 MG PO TABS
150.0000 mg | ORAL_TABLET | Freq: Every day | ORAL | Status: DC
  Administered 2018-03-08 – 2018-03-10 (×3): 150 mg via ORAL
  Filled 2018-03-08 (×3): qty 1

## 2018-03-08 MED ORDER — ASPIRIN EC 81 MG PO TBEC
81.0000 mg | DELAYED_RELEASE_TABLET | Freq: Every day | ORAL | Status: DC
  Administered 2018-03-09 – 2018-03-10 (×2): 81 mg via ORAL
  Filled 2018-03-08 (×3): qty 1

## 2018-03-08 NOTE — ED Notes (Signed)
Bed: WA01 Expected date:  Expected time:  Means of arrival:  Comments: EMS [address] testicular pain

## 2018-03-08 NOTE — ED Triage Notes (Signed)
Per ems pt has had pain for 2 weeks, progressively getting worse. Pt sent here from facility for further evaluation. No obvious distress per EMS. A&Ox4 per EMS. Fall 2 months ago taking out trash   BP 138/68 HR 90 RR22 O2 98%ra cbg 166

## 2018-03-08 NOTE — ED Provider Notes (Signed)
[facility] Emergency Department Provider Note MRN:  [id number]  [place] date & time: 03/08/18     Chief Complaint   Genital pain History of Present Illness   Hayden [name] is a 77 y.o. year-old male with a history of diabetes, fibromyalgia presenting to the ED with chief complaint of genital pain.  2 to 3 weeks of progressively worsening pain in the genitals.  No severe, 8 out of 10, burning pain worse with motion of the genitals.  Painful red rash to the penis and scrotum.  Denies pain to the testicles themselves.  Denies fever, no headache or vision change, no chest pain or shortness of breath, no abdominal pain  No alleviating factors.  Pain is constant.  Review of Systems  A complete 10 system review of systems was obtained and all systems are negative except as noted in the HPI and PMH.   Patient's Health History    Past Medical History:  Diagnosis Date  . Depression   . Diabetes mellitus   . Enlarged prostate   . Fibromyalgia   . GERD (gastroesophageal reflux disease)    reflux intermittent  . Hyperlipidemia   . 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 ADENOIDECTOMY    . CARPAL TUNNEL RELEASE     rt hand  . CATARACT EXTRACTION, BILATERAL    . CERVICAL FUSION  1998  . CYSTOSCOPY  07/26/2012   Procedure: CYSTOSCOPY;  Surgeon: Ailene Rud, MD;  Location: [facility];  Service: Urology;  Laterality: N/A;  DIAGNOSTIC CYSTO PROSTATE ULTRASOUND      . PROSTATE BIOPSY  07/26/2012   Procedure: PROSTATE BIOPSY;  Surgeon: Ailene Rud, MD;  Location: [facility];  Service: Urology;  Laterality: N/A;  . retinal micro aneurysms    . tonsil    . VASECTOMY    . VITRECTOMY      Family History  Problem Relation Age of Onset  . Diabetes Father   . Diabetes Sister   . Diabetes Brother     Social History   Socioeconomic  History  . Marital status: Married    Spouse name: Not on file  . Number of children: Not on file  . Years of education: Not on file  . Highest education level: Not on file  Occupational History  . Not on file  Social Needs  . Financial resource strain: Not on file  . Food insecurity:    Worry: Not on file    Inability: Not on file  . Transportation needs:    Medical: Not on file    Non-medical: Not on file  Tobacco Use  . Smoking status: Never Smoker  . Smokeless tobacco: Never Used  Substance and Sexual Activity  . Alcohol use: No    Frequency: Never  . Drug use: No  . Sexual activity: Not on file  Lifestyle  . Physical activity:    Days per week: Not on file    Minutes per session: Not on file  . Stress: Not on file  Relationships  . Social connections:    Talks on phone: Not on file    Gets together: Not on file    Attends religious service: Not on file    Active member of club or organization: Not on file    Attends meetings of clubs or organizations: Not on file    Relationship status:  Not on file  . Intimate partner violence:    Fear of current or ex partner: Not on file    Emotionally abused: Not on file    Physically abused: Not on file    Forced sexual activity: Not on file  Other Topics Concern  . Not on file  Social History Narrative  . Not on file     Physical Exam  Vital Signs and Nursing Notes reviewed Vitals:   03/08/18 1933 03/08/18 2035  BP: 139/71 (!) 145/76  Pulse: 90 87  Resp: 16 18  Temp:  98.3 F (36.8 C)  SpO2: 100% 98%    CONSTITUTIONAL: Chronically ill-appearing, NAD NEURO:  Alert and oriented x 3, no focal deficits EYES:  eyes equal and reactive ENT/NECK:  no LAD, no JVD CARDIO: Regular rate, well-perfused, normal S1 and S2 PULM:  CTAB no wheezing or rhonchi GI/GU:  normal bowel sounds, non-distended, non-tender; red, indurated skin of the penis and scrotum, no edema or tenderness to the testicles MSK/SPINE:  No gross  deformities, no edema SKIN:  no rash, atraumatic PSYCH:  Appropriate speech and behavior  Diagnostic and Interventional Summary    EKG Interpretation  Date/Time:    Ventricular Rate:    PR Interval:    QRS Duration:   QT Interval:    QTC Calculation:   R Axis:     Text Interpretation:        Labs Reviewed  CBC - Abnormal; Notable for the following components:      Result Value   WBC 16.0 (*)    All other components within normal limits  COMPREHENSIVE METABOLIC PANEL - Abnormal; Notable for the following components:   Glucose, Bld 136 (*)    BUN 28 (*)    All other components within normal limits  URINALYSIS, ROUTINE W REFLEX MICROSCOPIC - Abnormal; Notable for the following components:   APPearance HAZY (*)    Hgb urine dipstick SMALL (*)    Nitrite POSITIVE (*)    Leukocytes, UA LARGE (*)    WBC, UA >50 (*)    Bacteria, UA MANY (*)    All other components within normal limits  GLUCOSE, CAPILLARY - Abnormal; Notable for the following components:   Glucose-Capillary 115 (*)    All other components within normal limits  URINE CULTURE  SEDIMENTATION RATE  C-REACTIVE PROTEIN  CBC  BASIC METABOLIC PANEL    No orders to display    Medications  aspirin EC tablet 81 mg (has no administration in time range)  valACYclovir (VALTREX) tablet 1,000 mg (1,000 mg Oral Given 03/08/18 2131)  pravastatin (PRAVACHOL) tablet 20 mg (has no administration in time range)  QUEtiapine (SEROQUEL) tablet 12.5 mg (12.5 mg Oral Given 03/08/18 2123)  tamsulosin (FLOMAX) capsule 0.4 mg (0.4 mg Oral Given 03/08/18 2122)  insulin detemir (LEVEMIR) injection 30 Units (has no administration in time range)  traZODone (DESYREL) tablet 150 mg (150 mg Oral Given 03/08/18 2122)  buPROPion (WELLBUTRIN XL) 24 hr tablet 300 mg (has no administration in time range)  enoxaparin (LOVENOX) injection 40 mg (40 mg Subcutaneous Given 03/08/18 2122)  acetaminophen (TYLENOL) tablet 650 mg (has no administration in time  range)    Or  acetaminophen (TYLENOL) suppository 650 mg (has no administration in time range)  ondansetron (ZOFRAN) tablet 4 mg (has no administration in time range)    Or  ondansetron (ZOFRAN) injection 4 mg (has no administration in time range)  HYDROcodone-acetaminophen (NORCO/VICODIN) 5-325 MG per tablet 1 tablet (has  no administration in time range)  cefTRIAXone (ROCEPHIN) 1 g in sodium chloride 0.9 % 100 mL IVPB (has no administration in time range)  insulin aspart (novoLOG) injection 0-9 Units (has no administration in time range)  vancomycin (VANCOCIN) IVPB 1000 mg/200 mL premix (has no administration in time range)  Influenza vac split quadrivalent PF (FLUZONE HIGH-DOSE) injection 0.5 mL (has no administration in time range)  feeding supplement (ENSURE ENLIVE) (ENSURE ENLIVE) liquid 237 mL (has no administration in time range)  hydrALAZINE (APRESOLINE) injection 10 mg (has no administration in time range)  sodium chloride 0.9 % bolus 1,000 mL (0 mLs Intravenous Stopped 03/08/18 1920)  HYDROmorphone (DILAUDID) injection 0.5 mg (0.5 mg Intravenous Given 03/08/18 1805)  vancomycin (VANCOCIN) IVPB 1000 mg/200 mL premix (1,000 mg Intravenous New Bag/Given 03/08/18 1921)  cefTRIAXone (ROCEPHIN) 2 g in sodium chloride 0.9 % 100 mL IVPB (0 g Intravenous Stopped 03/08/18 1920)     Procedures Critical Care  ED Course and Medical Decision Making  I have reviewed the triage vital signs and the nursing notes.  Pertinent labs & imaging results that were available during my care of the patient were reviewed by me and considered in my medical decision making (see below for details).    Concern for cellulitis of the genitals in this 77 year old male, history of diabetes.  Nothing on exam to suggest deeper space infection, no testicular pain.  Will provide IV antibiotics and likely push for admission.  Admitted to hospital service for further care and evaluation.  Given vancomycin and ceftriaxone IV in  the emergency department.  Barth Kirks. [place] Small, [place] [email or web address]  Final Clinical Impressions(s) / ED Diagnoses     ICD-10-CM   1. Cellulitis of other specified site L03.818     ED Discharge Orders    None         Maudie Flakes, MD 03/08/18 2353

## 2018-03-08 NOTE — H&P (Signed)
History and Physical    Hayden [name] YKD:[id number] DOB: 04-01-1941 DOA: 03/08/2018  Referring MD/NP/PA: Glean Hess, MD PCP: Juanell Fairly, MD (Inactive)  Patient coming from: [place] via EMS  Chief Complaint: Pain of groin  I have personally briefly reviewed patient's old medical records in [place]   HPI: Hayden [name] is a 77 y.o. male with medical history significant of CLL followed by Dr. Earlie Server, DM type II, HLD, BPH, fibromyalgia, and dementia; who presented with worsening pain of groin and lower abdomen over the last 2 weeks.  History from the patient is fair although exact dates are not clear.  He describes it as a steady burning pain that he rates as a 8+ out of 10 on the pain scale.  There is a sore on the left side of the shaft of the penis that was present during the same time.  He reports associated symptoms of redness, tenderness to the touch, and urinary frequency.  Denies any recent trauma, pain of his testicles, fever, chills, cough, shortness of breath, nausea, vomiting, diarrhea, or leg swelling.  Last fall reported approximately 2 months ago.  ED Course: Upon admission into the emergency department patient was noted to be relatively within normal limits.  Labs revealed WBC 16, BUN 28, creatinine 0.92, and glucose 136.  Patient was given 1 L of normal saline IV fluids, vancomycin, ceftriaxone, and 0.5 mg of Dilaudid  IV for pain.  TRH called to admit for suspected cellulitis.  Review of Systems  Constitutional: Negative for chills and fever.  HENT: Negative for congestion and sinus pain.   Eyes: Negative for pain.  Respiratory: Negative for cough and shortness of breath.   Cardiovascular: Negative for chest pain and leg swelling.  Gastrointestinal: Positive for abdominal pain. Negative for blood in stool, diarrhea, melena, nausea and vomiting.  Genitourinary: Positive for frequency. Negative for dysuria.  Musculoskeletal: Negative for neck pain.    Skin: Positive for rash. Negative for itching.  Neurological: Negative for loss of consciousness and weakness.  Endo/Heme/Allergies: Does not bruise/bleed easily.  Psychiatric/Behavioral: Positive for memory loss. Negative for substance abuse.    Past Medical History:  Diagnosis Date  . Depression   . Diabetes mellitus   . Enlarged prostate   . Fibromyalgia   . GERD (gastroesophageal reflux disease)    reflux intermittent  . Hyperlipidemia   . 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 ADENOIDECTOMY    . CARPAL TUNNEL RELEASE     rt hand  . CATARACT EXTRACTION, BILATERAL    . CERVICAL FUSION  1998  . CYSTOSCOPY  07/26/2012   Procedure: CYSTOSCOPY;  Surgeon: Ailene Rud, MD;  Location: [facility];  Service: Urology;  Laterality: N/A;  DIAGNOSTIC CYSTO PROSTATE ULTRASOUND      . PROSTATE BIOPSY  07/26/2012   Procedure: PROSTATE BIOPSY;  Surgeon: Ailene Rud, MD;  Location: [facility];  Service: Urology;  Laterality: N/A;  . retinal micro aneurysms    . tonsil    . VASECTOMY    . VITRECTOMY       reports that he has never smoked. He has never used smokeless tobacco. He reports that he does not drink alcohol or use drugs.  Allergies  Allergen Reactions  . Bee Venom Other (See Comments)    Bump where stung and took days to go away  .  Glucophage [Metformin] Diarrhea    "i will never take it again -stool incontinent "  . Shellfish Allergy Swelling    Family History  Problem Relation Age of Onset  . Diabetes Father   . Diabetes Sister   . Diabetes Brother     Prior to Admission medications   Medication Sig Start Date End Date Taking? Authorizing Provider  aspirin 81 MG tablet Take 81 mg by mouth at bedtime.    Yes [provider]  buPROPion (WELLBUTRIN XL) 300 MG 24 hr tablet Take 300 mg by mouth daily.   Yes [provider]  cetirizine (ZYRTEC) 10 MG tablet Take 10 mg by mouth daily.   Yes [provider]  Cholecalciferol (VITAMIN D3) 1000 UNITS CAPS Take 1 capsule by mouth at bedtime.   Yes [provider]  fish oil-omega-3 fatty acids 1000 MG capsule Take 1 g by mouth 2 (two) times daily.    Yes [provider]  insulin lispro (HUMALOG KWIKPEN) 100 UNIT/ML KiwkPen Inject 3-5 Units into the skin 3 (three) times daily before meals.    Yes [provider]  LEVEMIR FLEXTOUCH 100 UNIT/ML Pen Inject 30 Units into the skin at bedtime.  07/16/15  Yes [provider]  lovastatin (MEVACOR) 20 MG tablet Take 20 mg by mouth at bedtime.   Yes [provider]  naproxen sodium (ALEVE) 220 MG tablet Take 220 mg by mouth every 12 (twelve) hours as needed (pain).   Yes [provider]  tamsulosin (FLOMAX) 0.4 MG CAPS capsule Take 0.4 mg by mouth at bedtime.   Yes [provider]  traZODone (DESYREL) 100 MG tablet Take 150 mg by mouth at bedtime.   Yes [provider]  valACYclovir (VALTREX) 1000 MG tablet Take 1,000 mg by mouth 3 (three) times daily.   Yes [provider]  vitamin B-12 (CYANOCOBALAMIN) 1000 MCG tablet Take 1,000 mcg by mouth daily.   Yes [provider]  ciprofloxacin (CIPRO) 500 MG tablet Take 1 tablet (500 mg total) by mouth 2 (two) times daily. Patient not taking: Reported on 03/08/2018 09/10/17   [name], DO  donepezil (ARICEPT) 10 MG tablet Take 1/2 tablet daily for 2 weeks, then increase to 1 tablet daily Patient not taking: Reported on 03/08/2018 10/22/17   Cameron Sprang, MD  JANUVIA 100 MG tablet Take 100 mg by mouth daily. 09/21/17   [provider]  QUEtiapine (SEROQUEL) 25 MG tablet Take 12.5 mg by mouth 2 (two) times daily.    [provider]  zolpidem (AMBIEN) 5 MG tablet Take 5 mg by mouth at bedtime as needed for sleep.  07/03/17   [provider]  calcium  citrate-vitamin D (CITRACAL+D) 315-200 MG-UNIT per tablet Take 1 tablet by mouth daily.  09/04/11  [provider]  terazosin (HYTRIN) 5 MG capsule Take 5 mg by mouth at bedtime.  09/04/11  [provider]    Physical Exam:  Constitutional: Elderly male in NAD, calm, comfortable Vitals:   03/08/18 1601 03/08/18 1805  BP: (!) 146/69 (!) 141/75  Pulse: 87 81  Resp: 16 16  Temp: 98.3 F (36.8 C)   TempSrc: Oral   SpO2: 100% 99%   Eyes: PERRL, lids and conjunctivae normal ENMT: Mucous membranes are dry. Posterior pharynx clear of any exudate or lesions.  Neck: normal, supple, no masses, no thyromegaly Respiratory: clear to auscultation bilaterally, no wheezing, no crackles. Normal respiratory effort. No accessory muscle use.  Cardiovascular:  Regular rate and rhythm, no murmurs / rubs / gallops. No extremity edema. 2+ pedal pulses. No carotid bruits.  Abdomen: no tenderness, no masses palpated. No hepatosplenomegaly. Bowel sounds positive.  Genitourinary: Redness with increased warmth noted of the penis and scrotum with mild swelling.  Small wound noted at the left shaft of the penis.  No significant tenderness to palpation of testicles. Musculoskeletal: no clubbing / cyanosis. No joint deformity upper and lower extremities. Good ROM, no contractures. Normal muscle tone.  Skin: As seen above with no other significant rashes or lesions noted Neurologic: CN 2-12 grossly intact. Sensation intact, DTR normal. Strength 5/5 in all 4.  Psychiatric: Mild recent memory impairment.  Normal judgment and insight. Alert and oriented x 3. Normal mood.     Labs on Admission: I have personally reviewed following labs and imaging studies  CBC: Recent Labs  Lab 03/08/18 1806  WBC 16.0*  HGB 13.9  HCT 40.3  MCV 92.2  PLT 161   Basic Metabolic Panel: Recent Labs  Lab 03/08/18 1806  NA 138  K 3.8  CL 102  CO2 25  GLUCOSE 136*  BUN 28*  CREATININE 0.92  CALCIUM 9.9    GFR: CrCl cannot be calculated (Unknown ideal weight.). Liver Function Tests: Recent Labs  Lab 03/08/18 1806  AST 27  ALT 28  ALKPHOS 67  BILITOT 0.7  PROT 6.8  ALBUMIN 4.2   No results for input(s): LIPASE, AMYLASE in the last 168 hours. No results for input(s): AMMONIA in the last 168 hours. Coagulation Profile: No results for input(s): INR, PROTIME in the last 168 hours. Cardiac Enzymes: No results for input(s): CKTOTAL, CKMB, CKMBINDEX, TROPONINI in the last 168 hours. BNP (last 3 results) No results for input(s): PROBNP in the last 8760 hours. HbA1C: No results for input(s): HGBA1C in the last 72 hours. CBG: No results for input(s): GLUCAP in the last 168 hours. Lipid Profile: No results for input(s): CHOL, HDL, LDLCALC, TRIG, CHOLHDL, LDLDIRECT in the last 72 hours. Thyroid Function Tests: No results for input(s): TSH, T4TOTAL, FREET4, T3FREE, THYROIDAB in the last 72 hours. Anemia Panel: No results for input(s): VITAMINB12, FOLATE, FERRITIN, TIBC, IRON, RETICCTPCT in the last 72 hours. Urine analysis:    Component Value Date/Time   COLORURINE YELLOW 09/06/2017 1937   APPEARANCEUR CLEAR 09/06/2017 1937   LABSPEC 1.005 09/06/2017 1937   PHURINE 8.0 09/06/2017 1937   GLUCOSEU NEGATIVE 09/06/2017 1937   HGBUR SMALL (A) 09/06/2017 1937   BILIRUBINUR NEGATIVE 09/06/2017 1937   KETONESUR 5 (A) 09/06/2017 1937   PROTEINUR NEGATIVE 09/06/2017 1937   NITRITE NEGATIVE 09/06/2017 1937   LEUKOCYTESUR LARGE (A) 09/06/2017 1937   Sepsis Labs: No results found for this or any previous visit (from the past 240 hour(s)).   Radiological Exams on Admission: No results found.   Assessment/Plan Cellulitis of male genitalia: Acute.  Patient presents with complaints of pain of scrotum and penis with increasing redness over the last few weeks.  Patient currently and adult diapers as possible contributing factor along with history of diabetes mellitus type 2.  - Admit to a  MedSurg bed - Add on CRP and ESR - Scrotal support - Continue empiric antibiotics of vancomycin and ceftriaxone - De-escalate antibiotics when medically appropriate - Oral hydrocodone as needed for pain - Consider need of imaging   Leukocytosis: WBC elevated at 16.  Suspect possibly related with above.  Patient complains of urinary frequency which could be related to BPH, but patient with history  of frequent UTIs as well.  - Follow-up urinalysis - Recheck CBC in a.m.   Dehydration: Patient with elevated BUN of 28 with creatinine 0.92 on admission.  Some aspect of dehydration related to the elevated BUN to creatinine ratio.  Patient was given 1 L of normal saline IV fluids while in the ED.  Diabetes mellitus type 2: Last hemoglobin A1c is unknown.  On admission glucose is mildly elevated 136. - Hypoglycemic protocol - Continue Levemir per home regimen - CBGs CBGs q. 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  Followed in the outpatient setting by Dr. Earlie Server on an as-needed basis per review of office records from 08/12/2017. - Continue outpatient follow-up   Depression: Stable. - Continue Wellbutrin, Seroquel, trazodone  Dementia: Patient noted to have mild dementia evaluated by neurology.  Patient previously on donepezil, but no longer appears to be taking this medication.  BPH - Continue Flomax - Check postvoid residuals  Hyperlipidemia - Continue pharmacy substitution of pravastatin   DVT prophylaxis:lovenox  Code Status: Full  Family Communication: No family present at bedside Disposition Plan: Likely discharge back to [facility] once medically stable Consults called: None Admission status: Observation  Norval Morton MD [facility] Pager [phone]   If 7PM-7AM, please contact night-coverage [email or web address] Password [email or web address]  03/08/2018, 7:23 PM

## 2018-03-08 NOTE — Progress Notes (Signed)
Pharmacy Antibiotic Note  Hayden [name] is a 77 y.o. male admitted on 03/08/2018 with cellulitis.  Pharmacy has been consulted for vancomycin dosing. Vancomycin 1g IV given in ED ~ 19:21  Plan:  Vancomycin 1g IV q24h for estimated AUC 406  Check levels at steady state, goal AUC 400-500  Rocephin per MD  Follow up renal function & cultures     Temp (24hrs), Avg:98.3 F (36.8 C), Min:98.3 F (36.8 C), Max:98.3 F (36.8 C)  Recent Labs  Lab 03/08/18 1806  WBC 16.0*  CREATININE 0.92    CrCl cannot be calculated (Unknown ideal weight.).    Allergies  Allergen Reactions  . Bee Venom Other (See Comments)    Bump where stung and took days to go away  . Glucophage [Metformin] Diarrhea    "i will never take it again -stool incontinent "  . Shellfish Allergy Swelling    Antimicrobials this admission: 9/9 Vancomycin >> 9/9 Rocephin >>  Dose adjustments this admission:   Microbiology results:  Thank you for allowing pharmacy to be a part of this patient's care.  Peggyann Juba, PharmD, BCPS Pager: [phone] 03/08/2018 8:08 PM

## 2018-03-08 NOTE — ED Notes (Signed)
ED TO INPATIENT HANDOFF REPORT  Name/Age/Gender Hayden [name] 77 y.o. male  Code Status    Code Status Orders  (From admission, onward)         Start     Ordered   03/08/18 1938  Full code  Continuous     03/08/18 1941        Code Status History    Date Active Date Inactive Code Status Order ID Comments User Context   09/06/2017 2211 09/10/2017 2151 Full Code [id number]  Jani Gravel, MD ED      Home/SNF/Other Nursing Home  Chief Complaint Groin Pain   Level of Care/Admitting Diagnosis ED Disposition    ED Disposition Condition [facility] Area: [facility] [[id number]]  Level of Care: Med-Surg [16]  Diagnosis: Cellulitis, scrotum [080223]  Admitting Physician: Norval Morton [[id number]]  Attending Physician: Norval Morton [[id number]]  PT Class (Do Not Modify): Observation [104]  PT Acc Code (Do Not Modify): Observation [10022]       Medical History Past Medical History:  Diagnosis Date  . Depression   . Diabetes mellitus   . Enlarged prostate   . Fibromyalgia   . GERD (gastroesophageal reflux disease)    reflux intermittent  . Hyperlipidemia   . 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 Bee Venom Other (See Comments)    Bump where stung and took days to go away  . Glucophage [Metformin] Diarrhea    "i will never take it again -stool incontinent "  . Shellfish Allergy Swelling    IV Location/Drains/Wounds Patient Lines/Drains/Airways Status   Active Line/Drains/Airways    Name:   Placement date:   Placement time:   Site:   Days:   Peripheral IV 03/08/18 Left Antecubital   03/08/18    1803    Antecubital   less than 1          Labs/Imaging Results for orders placed or performed during the hospital encounter of 03/08/18 (from the past 48 hour(s))  CBC     Status: Abnormal   Collection Time: 03/08/18  6:06 PM  Result Value Ref Range   WBC  16.0 (H) 4.0 - 10.5 K/uL   RBC 4.37 4.22 - 5.81 MIL/uL   Hemoglobin 13.9 13.0 - 17.0 g/dL   HCT 40.3 39.0 - 52.0 %   MCV 92.2 78.0 - 100.0 fL   MCH 31.8 26.0 - 34.0 pg   MCHC 34.5 30.0 - 36.0 g/dL   RDW 12.4 11.5 - 15.5 %   Platelets 237 150 - 400 K/uL    Comment: Performed at [facility], [place] [address], [place], [place] [address]  Comprehensive metabolic panel     Status: Abnormal   Collection Time: 03/08/18  6:06 PM  Result Value Ref Range   Sodium 138 135 - 145 mmol/L   Potassium 3.8 3.5 - 5.1 mmol/L   Chloride 102 98 - 111 mmol/L   CO2 25 22 - 32 mmol/L   Glucose, Bld 136 (H) 70 - 99 mg/dL   BUN 28 (H) 8 - 23 mg/dL   Creatinine, Ser 0.92 0.61 - 1.24 mg/dL   Calcium 9.9 8.9 - 10.3 mg/dL   Total Protein 6.8 6.5 - 8.1 g/dL   Albumin 4.2 3.5 - 5.0 g/dL   AST 27 15 - 41 U/L   ALT 28 0 - 44 U/L  Alkaline Phosphatase 67 38 - 126 U/L   Total Bilirubin 0.7 0.3 - 1.2 mg/dL   GFR calc non Af Amer >60 >60 mL/min   GFR calc Af Amer >60 >60 mL/min    Comment: (NOTE) The eGFR has been calculated using the CKD EPI equation. This calculation has not been validated in all clinical situations. eGFR's persistently <60 mL/min signify possible Chronic Kidney Disease.    Anion gap 11 5 - 15    Comment: Performed at [facility], [place] [address] [place], [place] [address]   No results found.  Pending Labs Unresulted Labs (From admission, onward)    Start     Ordered   03/09/18 0500  CBC  Tomorrow morning,   R     03/08/18 1941   03/09/18 8159  Basic metabolic panel  Tomorrow morning,   R     03/08/18 1941   03/08/18 1946  Sedimentation rate  Add-on,   R     03/08/18 1945   03/08/18 1946  C-reactive protein  Add-on,   R     03/08/18 1945   03/08/18 1930  Urinalysis, Routine w reflex microscopic  STAT,   R     03/08/18 1929          Vitals/Pain Today's Vitals   03/08/18 1601 03/08/18 1805 03/08/18 1921 03/08/18 1933  BP: (!) 146/69 (!)  141/75  139/71  Pulse: 87 81  90  Resp: '16 16  16  ' Temp: 98.3 F (36.8 C)     TempSrc: Oral     SpO2: 100% 99%  100%  PainSc: 8   0-No pain     Isolation Precautions No active isolations  Medications Medications  vancomycin (VANCOCIN) IVPB 1000 mg/200 mL premix (1,000 mg Intravenous New Bag/Given 03/08/18 1921)  aspirin tablet 81 mg (has no administration in time range)  valACYclovir (VALTREX) tablet 1,000 mg (has no administration in time range)  pravastatin (PRAVACHOL) tablet 20 mg (has no administration in time range)  QUEtiapine (SEROQUEL) tablet 12.5 mg (has no administration in time range)  tamsulosin (FLOMAX) capsule 0.4 mg (has no administration in time range)  Insulin Detemir (LEVEMIR) FlexPen 30 Units (has no administration in time range)  traZODone (DESYREL) tablet 150 mg (has no administration in time range)  buPROPion (WELLBUTRIN XL) 24 hr tablet 300 mg (has no administration in time range)  enoxaparin (LOVENOX) injection 40 mg (has no administration in time range)  acetaminophen (TYLENOL) tablet 650 mg (has no administration in time range)    Or  acetaminophen (TYLENOL) suppository 650 mg (has no administration in time range)  ondansetron (ZOFRAN) tablet 4 mg (has no administration in time range)    Or  ondansetron (ZOFRAN) injection 4 mg (has no administration in time range)  HYDROcodone-acetaminophen (NORCO/VICODIN) 5-325 MG per tablet 1 tablet (has no administration in time range)  cefTRIAXone (ROCEPHIN) 1 g in sodium chloride 0.9 % 100 mL IVPB (has no administration in time range)  insulin aspart (novoLOG) injection 0-9 Units (has no administration in time range)  sodium chloride 0.9 % bolus 1,000 mL (0 mLs Intravenous Stopped 03/08/18 1920)  HYDROmorphone (DILAUDID) injection 0.5 mg (0.5 mg Intravenous Given 03/08/18 1805)  cefTRIAXone (ROCEPHIN) 2 g in sodium chloride 0.9 % 100 mL IVPB (0 g Intravenous Stopped 03/08/18 1920)    Mobility walks with device

## 2018-03-09 DIAGNOSIS — K219 Gastro-esophageal reflux disease without esophagitis: Secondary | ICD-10-CM | POA: Diagnosis present

## 2018-03-09 DIAGNOSIS — C9190 Lymphoid leukemia, unspecified not having achieved remission: Secondary | ICD-10-CM | POA: Diagnosis not present

## 2018-03-09 DIAGNOSIS — N499 Inflammatory disorder of unspecified male genital organ: Secondary | ICD-10-CM | POA: Diagnosis not present

## 2018-03-09 DIAGNOSIS — F329 Major depressive disorder, single episode, unspecified: Secondary | ICD-10-CM | POA: Diagnosis present

## 2018-03-09 DIAGNOSIS — N492 Inflammatory disorders of scrotum: Secondary | ICD-10-CM | POA: Diagnosis present

## 2018-03-09 DIAGNOSIS — Z23 Encounter for immunization: Secondary | ICD-10-CM | POA: Diagnosis not present

## 2018-03-09 DIAGNOSIS — D72829 Elevated white blood cell count, unspecified: Secondary | ICD-10-CM | POA: Diagnosis not present

## 2018-03-09 DIAGNOSIS — Z9842 Cataract extraction status, left eye: Secondary | ICD-10-CM | POA: Diagnosis not present

## 2018-03-09 DIAGNOSIS — Z7982 Long term (current) use of aspirin: Secondary | ICD-10-CM | POA: Diagnosis not present

## 2018-03-09 DIAGNOSIS — N401 Enlarged prostate with lower urinary tract symptoms: Secondary | ICD-10-CM | POA: Diagnosis not present

## 2018-03-09 DIAGNOSIS — N4822 Cellulitis of corpus cavernosum and penis: Secondary | ICD-10-CM | POA: Diagnosis present

## 2018-03-09 DIAGNOSIS — Z791 Long term (current) use of non-steroidal anti-inflammatories (NSAID): Secondary | ICD-10-CM | POA: Diagnosis not present

## 2018-03-09 DIAGNOSIS — Z91013 Allergy to seafood: Secondary | ICD-10-CM | POA: Diagnosis not present

## 2018-03-09 DIAGNOSIS — Z833 Family history of diabetes mellitus: Secondary | ICD-10-CM | POA: Diagnosis not present

## 2018-03-09 DIAGNOSIS — E785 Hyperlipidemia, unspecified: Secondary | ICD-10-CM | POA: Diagnosis present

## 2018-03-09 DIAGNOSIS — Z961 Presence of intraocular lens: Secondary | ICD-10-CM | POA: Diagnosis present

## 2018-03-09 DIAGNOSIS — M797 Fibromyalgia: Secondary | ICD-10-CM | POA: Diagnosis present

## 2018-03-09 DIAGNOSIS — Z91030 Bee allergy status: Secondary | ICD-10-CM | POA: Diagnosis not present

## 2018-03-09 DIAGNOSIS — Z981 Arthrodesis status: Secondary | ICD-10-CM | POA: Diagnosis not present

## 2018-03-09 DIAGNOSIS — C9110 Chronic lymphocytic leukemia of B-cell type not having achieved remission: Secondary | ICD-10-CM | POA: Diagnosis present

## 2018-03-09 DIAGNOSIS — R350 Frequency of micturition: Secondary | ICD-10-CM | POA: Diagnosis present

## 2018-03-09 DIAGNOSIS — N5082 Scrotal pain: Secondary | ICD-10-CM | POA: Diagnosis present

## 2018-03-09 DIAGNOSIS — Z9841 Cataract extraction status, right eye: Secondary | ICD-10-CM | POA: Diagnosis not present

## 2018-03-09 DIAGNOSIS — R4182 Altered mental status, unspecified: Secondary | ICD-10-CM | POA: Diagnosis not present

## 2018-03-09 DIAGNOSIS — F0390 Unspecified dementia without behavioral disturbance: Secondary | ICD-10-CM | POA: Diagnosis not present

## 2018-03-09 DIAGNOSIS — Z87440 Personal history of urinary (tract) infections: Secondary | ICD-10-CM | POA: Diagnosis not present

## 2018-03-09 DIAGNOSIS — N485 Ulcer of penis: Secondary | ICD-10-CM | POA: Diagnosis present

## 2018-03-09 DIAGNOSIS — E118 Type 2 diabetes mellitus with unspecified complications: Secondary | ICD-10-CM | POA: Diagnosis not present

## 2018-03-09 DIAGNOSIS — Z888 Allergy status to other drugs, medicaments and biological substances status: Secondary | ICD-10-CM | POA: Diagnosis not present

## 2018-03-09 DIAGNOSIS — D649 Anemia, unspecified: Secondary | ICD-10-CM | POA: Diagnosis present

## 2018-03-09 DIAGNOSIS — Z794 Long term (current) use of insulin: Secondary | ICD-10-CM | POA: Diagnosis not present

## 2018-03-09 LAB — BASIC METABOLIC PANEL
Anion gap: 9 (ref 5–15)
BUN: 21 mg/dL (ref 8–23)
CHLORIDE: 110 mmol/L (ref 98–111)
CO2: 22 mmol/L (ref 22–32)
CREATININE: 0.85 mg/dL (ref 0.61–1.24)
Calcium: 8.8 mg/dL — ABNORMAL LOW (ref 8.9–10.3)
GFR calc Af Amer: >60 mL/min (ref >60)
GFR calc non Af Amer: >60 mL/min (ref >60)
GLUCOSE: 125 mg/dL — AB (ref 70–99)
Potassium: 4 mmol/L (ref 3.5–5.1)
SODIUM: 141 mmol/L (ref 135–145)

## 2018-03-09 LAB — GLUCOSE, CAPILLARY
GLUCOSE-CAPILLARY: 130 mg/dL — AB (ref 70–99)
GLUCOSE-CAPILLARY: 290 mg/dL — AB (ref 70–99)
GLUCOSE-CAPILLARY: 302 mg/dL — AB (ref 70–99)
Glucose-Capillary: 257 mg/dL — ABNORMAL HIGH (ref 70–99)
Glucose-Capillary: 79 mg/dL (ref 70–99)
Glucose-Capillary: 113 mg/dL — ABNORMAL HIGH (ref 70–99)

## 2018-03-09 LAB — CBC
HEMATOCRIT: 39.4 % (ref 39.0–52.0)
HEMOGLOBIN: 13.3 g/dL (ref 13.0–17.0)
MCH: 31.9 pg (ref 26.0–34.0)
MCHC: 33.8 g/dL (ref 30.0–36.0)
MCV: 94.5 fL (ref 78.0–100.0)
Platelets: 193 10*3/uL (ref 150–400)
RBC: 4.17 MIL/uL — AB (ref 4.22–5.81)
RDW: 12.7 % (ref 11.5–15.5)
WBC: 12.4 10*3/uL — ABNORMAL HIGH (ref 4.0–10.5)

## 2018-03-09 MED ORDER — BACITRACIN ZINC 500 UNIT/GM EX OINT
TOPICAL_OINTMENT | Freq: Two times a day (BID) | CUTANEOUS | Status: DC
  Administered 2018-03-09: 1 via TOPICAL
  Administered 2018-03-09 – 2018-03-11 (×4): via TOPICAL
  Filled 2018-03-09: qty 28.35

## 2018-03-09 NOTE — Evaluation (Signed)
Physical Therapy Evaluation Patient Details Name: Hayden [name] MRN: [id number] DOB: Nov 17, 1940 Today's Date: 03/09/2018   History of Present Illness  [name] is a 77 y.o. male with medical history significant of CLL , DM type II, HLD, BPH, fibromyalgia, and dementia; who presented with worsening pain of groin and lower abdomen over the last 2 weeks, noted with cellulitis.  Clinical Impression  The patient is functioning at  Baseline, resides at ALF where wife is in memory care. No further PT indicated. Encouraged patient to ask staff to assist  With ambulation in hall.  Patient ambulated x 800' with Quad cane. PT to sign off.    Follow Up Recommendations No PT follow up    Equipment Recommendations  None recommended by PT    Recommendations for Other Services       Precautions / Restrictions Precautions Precautions: Fall      Mobility  Bed Mobility Overal bed mobility: Independent                Transfers Overall transfer level: Modified independent Equipment used: Quad cane                Ambulation/Gait Ambulation/Gait assistance: Supervision Gait Distance (Feet): 800 Feet Assistive device: Quad cane Gait Pattern/deviations: Drifts right/left Gait velocity: decreased   General Gait Details: no balance losses, talked and walked, avoided people and objuects without Loss of balance  Stairs            Wheelchair Mobility    Modified Rankin (Stroke Patients Only)       Balance Overall balance assessment: Modified Independent                                           Pertinent Vitals/Pain Pain Assessment: No/denies pain    Home Living Family/patient expects to be discharged to:: Assisted living               Home Equipment: Cane - quad      Prior Function Level of Independence: Needs assistance   Gait / Transfers Assistance Needed: Mod I with Quad cane           Hand Dominance         Extremity/Trunk Assessment   Upper Extremity Assessment Upper Extremity Assessment: Generalized weakness;RUE deficits/detail;LUE deficits/detail RUE Coordination: decreased fine motor LUE Coordination: decreased gross motor    Lower Extremity Assessment Lower Extremity Assessment: Generalized weakness    Cervical / Trunk Assessment Cervical / Trunk Assessment: Kyphotic  Communication   Communication: No difficulties  Cognition Arousal/Alertness: Awake/alert Behavior During Therapy: WFL for tasks assessed/performed Overall Cognitive Status: History of cognitive impairments - at baseline Area of Impairment: Orientation                               General Comments: patient reports needing assistance for meal ordering and using hospital phone.      General Comments General comments (skin integrity, edema, etc.): patient had donned  long pants without assistance seated and donned shoes seated    Exercises     Assessment/Plan    PT Assessment Patent does not need any further PT services  PT Problem List         PT Treatment Interventions      PT Goals (Current goals can be found in  the Care Plan section)  Acute Rehab PT Goals Patient Stated Goal: to return to [name] to see wife PT Goal Formulation: All assessment and education complete, DC therapy    Frequency     Barriers to discharge        Co-evaluation               AM-PAC PT "6 Clicks" Daily Activity  Outcome Measure Difficulty turning over in bed (including adjusting bedclothes, sheets and blankets)?: None Difficulty moving from lying on back to sitting on the side of the bed? : None Difficulty sitting down on and standing up from a chair with arms (e.g., wheelchair, bedside commode, etc,.)?: None Help needed moving to and from a bed to chair (including a wheelchair)?: None Help needed walking in hospital room?: None Help needed climbing 3-5 steps with a railing? : A Little 6  Click Score: 23    End of Session   Activity Tolerance: Patient tolerated treatment well Patient left: in bed Nurse Communication: Mobility status PT Visit Diagnosis: Unsteadiness on feet (R26.81)    Time: 5053-9767 PT Time Calculation (min) (ACUTE ONLY): 34 min   Charges:   PT Evaluation $PT Eval Low Complexity: 1 Low PT Treatments $Gait Training: 8-22 mins        Tresa Endo PT Acute Rehabilitation Services Pager [phone] Office [phone]   Claretha Cooper 03/09/2018, 5:16 PM

## 2018-03-09 NOTE — Consult Note (Signed)
[facility] Nurse wound consult note Reason for Consult: Patient with cellulitis of penis and ulceration on the left side of the penile shaft. Wound type:Infectious Pressure Injury POA: Yes/No/NA Measurement:1cm x 0.4cm x 0.1cm Wound bed:red, dry Drainage (amount, consistency, odor) none Periwound:erythematous, edematous Dressing procedure/placement/frequency: While the scope of this consult is slightly beyond my expertise, I do have suggestions for the care of the partial thickness ulceration on the penile shaft and surrounding area which are conservative and with which I feel comfortable contributing.  A thin layer of bacitracin ointment can be applied to the ulcer twice daily after cleansing.  The use of our antimicrobial textile (InterDry Ag+) in this area (around the penis and in the inguinal areas and over the scrotum) is selected and recommended as it will donate an antimicrobial property, wick away moisture and be non adherent to the sensitive tissues. For further assistance, please consult urology.  [facility] nursing team will not follow, but will remain available to this patient, the nursing and medical teams.  Please re-consult if needed. Thanks, Maudie Flakes, MSN, RN, [place], Arther Abbott  Pager# [phone]

## 2018-03-09 NOTE — Progress Notes (Signed)
PROGRESS NOTE    Hayden [name]  ZOX:[id number] DOB: 1940-08-05 DOA: 03/08/2018 PCP: Juanell Fairly, MD (Inactive)   Brief Narrative:  HPI On 03/08/2018 by Dr. Fuller Plan Hayden [name] is a 77 y.o. male with medical history significant of CLL followed by Dr. Earlie Server, DM type II, HLD, BPH, fibromyalgia, and dementia; who presented with worsening pain of groin and lower abdomen over the last 2 weeks.  History from the patient is fair although exact dates are not clear.  He describes it as a steady burning pain that he rates as a 8+ out of 10 on the pain scale.  There is a sore on the left side of the shaft of the penis that was present during the same time.  He reports associated symptoms of redness, tenderness to the touch, and urinary frequency.  Denies any recent trauma, pain of his testicles, fever, chills, cough, shortness of breath, nausea, vomiting, diarrhea, or leg swelling.  Last fall reported approximately 2 months ago.  Interim history Admitted with cellulitis of the male genitalia, particularly on his scrotum and penis.  Continue IV antibiotic and wound care. Assessment & Plan   Acute cellulitis of the male genitalia with leukocytosis -Patient presented with complaints of scrotal and penile pain along with redness -Patient with small ulcerations on penile shaft as well as erythema on the penis as well as the scrotal skin -Wound care consulted: Recommend a thin layer of bacitracin ointment applied to the ulcer twice daily after cleansing.  Use intra-dry Ag around the penis and an inguinal areas and over scrotum. -ESR 5, CRP <0.8 -Continue vancomycin and ceftriaxone -Continue pain control as needed -WBC improving from admission, 16 down to 12.4 -UA shows many bacteria, >50, large leukocytes, positive nitrites -Urine culture pending  Diabetes mellitus, type II -Near, insulin sliding scale with CBG monitoring  Chronic lymphocytic leukemia -Followed by Dr. Earlie Server,  oncologist  Depression -Continue Wellbutrin, Seroquel, trazodone  Dementia -Patient followed by neurology, found to have mild dementia -Was on donezepil however does not appear to be taking this medication currently  BPH -Continue Flomax  Hyperlipidemia -Continue statin  DVT Prophylaxis  lovenox  Code Status: Full  Family Communication: None at bedside  Disposition Plan: Observation  Consultants None  Procedures  None  Antibiotics   Anti-infectives (From admission, onward)   Start     Dose/Rate Route Frequency Ordered Stop   03/09/18 1800  cefTRIAXone (ROCEPHIN) 1 g in sodium chloride 0.9 % 100 mL IVPB     1 g 200 mL/hr over 30 Minutes Intravenous Every 24 hours 03/08/18 1941     03/09/18 1300  vancomycin (VANCOCIN) IVPB 1000 mg/200 mL premix     1,000 mg 200 mL/hr over 60 Minutes Intravenous Every 24 hours 03/08/18 2011     03/08/18 2200  valACYclovir (VALTREX) tablet 1,000 mg     1,000 mg Oral 3 times daily 03/08/18 1941     03/08/18 1645  vancomycin (VANCOCIN) IVPB 1000 mg/200 mL premix     1,000 mg 200 mL/hr over 60 Minutes Intravenous  Once 03/08/18 1636 03/08/18 2021   03/08/18 1645  cefTRIAXone (ROCEPHIN) 2 g in sodium chloride 0.9 % 100 mL IVPB     2 g 200 mL/hr over 30 Minutes Intravenous  Once 03/08/18 1636 03/08/18 1920      Subjective:   Hayden [name] seen and examined today.  Continues to have penile pain and scrotal pain.  Does feel some itchiness.  Denies current chest  pain, shortness of breath, abdominal pain, nausea or vomiting, diarrhea or constipation, dizziness or headache.  Objective:   Vitals:   03/08/18 1933 03/08/18 2035 03/09/18 0546 03/09/18 1131  BP: 139/71 (!) 145/76 136/68   Pulse: 90 87 82   Resp: _0 Temp:  98.3 F (36.8 C) 98.2 F (36.8 C)   TempSrc:  Oral Oral   SpO2: 100% 98% 99%   Weight:    64 kg  Height:    _1  (1.676 m)    Intake/Output Summary (Last 24 hours) at 03/09/2018 1431 Last data filed at  03/09/2018 1000 Gross per 24 hour  Intake 240 ml  Output 200 ml  Net 40 ml   Filed Weights   03/09/18 1131  Weight: 64 kg    Exam  General: Well developed, well nourished, NAD, appears stated age  HEENT: NCAT, mucous membranes moist.   Neck: Supple  Cardiovascular: S1 S2 auscultated, no murmur, RRR  Respiratory: Clear to auscultation bilaterally with equal chest rise  Abdomen: Soft, nontender, nondistended, + bowel sounds  Extremities: warm dry without cyanosis clubbing or edema  Neuro: AAOx3, nonfocal  Skin/Genitalia: Erythema of the scrotal sac as well as penis, several small ulcerations noted on penile shaft.  Psych: Normal affect and demeanor    Data Reviewed: I have personally reviewed following labs and imaging studies  CBC: Recent Labs  Lab 03/08/18 1806 03/09/18 0429  WBC 16.0* 12.4*  HGB 13.9 13.3  HCT 40.3 39.4  MCV 92.2 94.5  PLT 237 127   Basic Metabolic Panel: Recent Labs  Lab 03/08/18 1806 03/09/18 0429  NA 138 141  K 3.8 4.0  CL 102 110  CO2 25 22  GLUCOSE 136* 125*  BUN 28* 21  CREATININE 0.92 0.85  CALCIUM 9.9 8.8*   GFR: Estimated Creatinine Clearance: 64.6 mL/min (by C-G formula based on SCr of 0.85 mg/dL). Liver Function Tests: Recent Labs  Lab 03/08/18 1806  AST 27  ALT 28  ALKPHOS 67  BILITOT 0.7  PROT 6.8  ALBUMIN 4.2   No results for input(s): LIPASE, AMYLASE in the last 168 hours. No results for input(s): AMMONIA in the last 168 hours. Coagulation Profile: No results for input(s): INR, PROTIME in the last 168 hours. Cardiac Enzymes: No results for input(s): CKTOTAL, CKMB, CKMBINDEX, TROPONINI in the last 168 hours. BNP (last 3 results) No results for input(s): PROBNP in the last 8760 hours. HbA1C: No results for input(s): HGBA1C in the last 72 hours. CBG: Recent Labs  Lab 03/08/18 2220 03/09/18 0802 03/09/18 1233 03/09/18 1234  GLUCAP 115* 130* 290* 302*   Lipid Profile: No results for input(s):  CHOL, HDL, LDLCALC, TRIG, CHOLHDL, LDLDIRECT in the last 72 hours. Thyroid Function Tests: No results for input(s): TSH, T4TOTAL, FREET4, T3FREE, THYROIDAB in the last 72 hours. Anemia Panel: No results for input(s): VITAMINB12, FOLATE, FERRITIN, TIBC, IRON, RETICCTPCT in the last 72 hours. Urine analysis:    Component Value Date/Time   COLORURINE YELLOW 03/08/2018 1942   APPEARANCEUR HAZY (A) 03/08/2018 1942   LABSPEC 1.014 03/08/2018 1942   PHURINE 6.0 03/08/2018 1942   GLUCOSEU NEGATIVE 03/08/2018 1942   HGBUR SMALL (A) 03/08/2018 1942   BILIRUBINUR NEGATIVE 03/08/2018 1942   KETONESUR NEGATIVE 03/08/2018 1942   PROTEINUR NEGATIVE 03/08/2018 1942   NITRITE POSITIVE (A) 03/08/2018 1942   LEUKOCYTESUR LARGE (A) 03/08/2018 1942   Sepsis Labs: _2 (procalcitonin:4,lacticidven:4)  )No results found for this or any previous visit (from the past  240 hour(s)).    Radiology Studies: No results found.   Scheduled Meds: . aspirin EC  81 mg Oral QHS  . bacitracin   Topical BID  . buPROPion  300 mg Oral Daily  . enoxaparin (LOVENOX) injection  40 mg Subcutaneous Q24H  . feeding supplement (ENSURE ENLIVE)  237 mL Oral BID BM  . insulin aspart  0-9 Units Subcutaneous TID WC  . insulin detemir  30 Units Subcutaneous QHS  . pravastatin  20 mg Oral q1800  . QUEtiapine  12.5 mg Oral BID  . tamsulosin  0.4 mg Oral QHS  . traZODone  150 mg Oral QHS  . valACYclovir  1,000 mg Oral TID   Continuous Infusions: . cefTRIAXone (ROCEPHIN)  IV    . vancomycin 1,000 mg (03/09/18 1320)     LOS: 0 days   Time Spent in minutes   30 minutes  Raziel Koenigs D.O. on 03/09/2018 at 2:31 PM  Between 7am to 7pm - Please see pager noted on [email or web address]  After 7pm go to [email or web address]  And look for the night coverage person covering for me after hours  [facility] Office  [phone]

## 2018-03-10 DIAGNOSIS — N499 Inflammatory disorder of unspecified male genital organ: Secondary | ICD-10-CM

## 2018-03-10 DIAGNOSIS — R4182 Altered mental status, unspecified: Secondary | ICD-10-CM

## 2018-03-10 DIAGNOSIS — Z794 Long term (current) use of insulin: Secondary | ICD-10-CM

## 2018-03-10 DIAGNOSIS — F0390 Unspecified dementia without behavioral disturbance: Secondary | ICD-10-CM

## 2018-03-10 DIAGNOSIS — E118 Type 2 diabetes mellitus with unspecified complications: Secondary | ICD-10-CM

## 2018-03-10 DIAGNOSIS — N401 Enlarged prostate with lower urinary tract symptoms: Secondary | ICD-10-CM

## 2018-03-10 DIAGNOSIS — N492 Inflammatory disorders of scrotum: Secondary | ICD-10-CM

## 2018-03-10 DIAGNOSIS — F329 Major depressive disorder, single episode, unspecified: Secondary | ICD-10-CM

## 2018-03-10 DIAGNOSIS — D72829 Elevated white blood cell count, unspecified: Secondary | ICD-10-CM

## 2018-03-10 LAB — BASIC METABOLIC PANEL
ANION GAP: 12 (ref 5–15)
BUN: 20 mg/dL (ref 8–23)
CO2: 25 mmol/L (ref 22–32)
Calcium: 9.7 mg/dL (ref 8.9–10.3)
Chloride: 102 mmol/L (ref 98–111)
Creatinine, Ser: 0.82 mg/dL (ref 0.61–1.24)
GFR calc Af Amer: >60 mL/min (ref >60)
Glucose, Bld: 171 mg/dL — ABNORMAL HIGH (ref 70–99)
POTASSIUM: 4.1 mmol/L (ref 3.5–5.1)
SODIUM: 139 mmol/L (ref 135–145)

## 2018-03-10 LAB — GLUCOSE, CAPILLARY
GLUCOSE-CAPILLARY: 125 mg/dL — AB (ref 70–99)
GLUCOSE-CAPILLARY: 178 mg/dL — AB (ref 70–99)
Glucose-Capillary: 234 mg/dL — ABNORMAL HIGH (ref 70–99)
Glucose-Capillary: 116 mg/dL — ABNORMAL HIGH (ref 70–99)

## 2018-03-10 LAB — CBC
HCT: 42.3 % (ref 39.0–52.0)
HEMOGLOBIN: 14.2 g/dL (ref 13.0–17.0)
MCH: 31.6 pg (ref 26.0–34.0)
MCHC: 33.6 g/dL (ref 30.0–36.0)
MCV: 94.2 fL (ref 78.0–100.0)
PLATELETS: 222 10*3/uL (ref 150–400)
RBC: 4.49 MIL/uL (ref 4.22–5.81)
RDW: 12.5 % (ref 11.5–15.5)
WBC: 11.7 10*3/uL — AB (ref 4.0–10.5)

## 2018-03-10 MED ORDER — GLUCERNA SHAKE PO LIQD
237.0000 mL | Freq: Three times a day (TID) | ORAL | Status: DC
  Administered 2018-03-10 – 2018-03-11 (×3): 237 mL via ORAL
  Filled 2018-03-10 (×5): qty 237

## 2018-03-10 NOTE — Progress Notes (Signed)
PROGRESS NOTE    Hayden [name]  DSK:[id number] DOB: January 07, 1941 DOA: 03/08/2018 PCP: Juanell Fairly, MD (Inactive)  Brief Narrative: HPI per Dr. Fuller Plan on 03/08/18 HPI: Hayden [name] is a 77 y.o. male with medical history significant of CLL followed by Dr. Earlie Server, DM type II, HLD, BPH, fibromyalgia, and dementia; who presented with worsening pain of groin and lower abdomen over the last 2 weeks.  History from the patient is fair although exact dates are not clear.  He describes it as a steady burning pain that he rates as a 8+ out of 10 on the pain scale.  There is a sore on the left side of the shaft of the penis that was present during the same time.  He reports associated symptoms of redness, tenderness to the touch, and urinary frequency.  Denies any recent trauma, pain of his testicles, fever, chills, cough, shortness of breath, nausea, vomiting, diarrhea, or leg swelling.  Last fall reported approximately 2 months ago.  **Feeling better and not in any pain currently. Fixated on trying to find a PCP and repeating himself several times.   Assessment & Plan:   Principal Problem:   Cellulitis of male genitalia Active Problems:   CLL (chronic lymphocytic leukemia) (HCC)   Type 2 diabetes mellitus with complication (HCC)   Altered mental status   Mild dementia   Depression   BPH (benign prostatic hyperplasia)   Leukocytosis   Cellulitis, scrotum  Acute cellulitis of the male genitalia with leukocytosis in a patient with Frequent UTI's  -Patient presented with complaints of scrotal and penile pain along with redness -Patient with small ulcerations on penile shaft as well as erythema on the penis as well as the scrotal skin -Wound care consulted: Recommend a thin layer of bacitracin ointment applied to the ulcer twice daily after cleansing.  Use intra-dry Ag around the penis and an inguinal areas and over scrotum. -ESR 5, CRP <0.8 -Continue IV Vancomycin and Ceftriaxone for now    -Continue pain control as needed with Hydrocodone-Acetaminophen -WBC improving from admission, 16 down to 11.7 -UA shows many bacteria, >50, large leukocytes, positive nitrites; Last two Cx's Positive for Klebsiella Pneumoniae and Pseudomonas Aeruginosa -Urine culture is still pending -C/w Valcyclovir 1000 mg po Daily  -Will need Urology Follow up as an outpatient  -PT recommending no follow up   Diabetes mellitus, type II -Last hemoglobin A1c is unknown so will order .  - CBG's raning from 79-234 - Hypoglycemic protocol - Continue Levemir per home regimen with 30 units sq qHS - CBGs CBGs q. before meals with sensitive SSI  Chronic lymphocytic leukemia -Patient with stage 0 CLL not currently receiving treatment.   -Followed in the outpatient setting by Dr. Earlie Server on an as-needed basis per review of office records from 08/12/2017. -Continue outpatient follow-up   Depression -Continue Buprorpion 300 mg po Daily, Quetiapine 12.5 mg po BID qHS, Trazodone 150 mg po qHS  Dementia -Patient followed by neurology, found to have mild dementia  -Was on donezepil however does not appear to be taking this medication currently -Has been very Circumferential and Tangential in discussion and repeats himself multiple times and is fixated on obtaining a PCP on my examination   BPH -Continue Tamsulosin 0.4 mg po qHS   Hyperlipidemia -Continue Pravastatin 20 mg po Daily   DVT prophylaxis: Enoxaparin 40 mg sq q24h Code Status: FULL CODE  Family Communication: No family present at bedside but care-giver at bedside  Disposition Plan:  Anticipate D/C home in the next 24-48 hours if medically stable   Consultants:   None  Procedures: None   Antimicrobials:  Anti-infectives (From admission, onward)   Start     Dose/Rate Route Frequency Ordered Stop   03/09/18 1800  cefTRIAXone (ROCEPHIN) 1 g in sodium chloride 0.9 % 100 mL IVPB     1 g 200 mL/hr over 30 Minutes Intravenous Every 24  hours 03/08/18 1941     03/09/18 1300  vancomycin (VANCOCIN) IVPB 1000 mg/200 mL premix     1,000 mg 200 mL/hr over 60 Minutes Intravenous Every 24 hours 03/08/18 2011     03/08/18 2200  valACYclovir (VALTREX) tablet 1,000 mg     1,000 mg Oral 3 times daily 03/08/18 1941     03/08/18 1645  vancomycin (VANCOCIN) IVPB 1000 mg/200 mL premix     1,000 mg 200 mL/hr over 60 Minutes Intravenous  Once 03/08/18 1636 03/08/18 2021   03/08/18 1645  cefTRIAXone (ROCEPHIN) 2 g in sodium chloride 0.9 % 100 mL IVPB     2 g 200 mL/hr over 30 Minutes Intravenous  Once 03/08/18 1636 03/08/18 1920     Subjective: Seen and examined at bedside and patient states that he felt bad and wanted to establish with a PCP and he mentioned that at least 5 or 6 times.  States that his pain is better controlled and he thinks that his penis and cellulitis is improving.  No chest pain, lightheadedness or dizziness.  No other concerns or complaints at this time.  Objective: Vitals:   03/09/18 1532 03/09/18 2052 03/10/18 0630 03/10/18 1249  BP: 127/69 (!) 125/57 119/69 131/60  Pulse: 80 73 83 86  Resp: '18 18 18 17  ' Temp: 97.9 F (36.6 C) 99.2 F (37.3 C) 97.6 F (36.4 C) 98.6 F (37 C)  TempSrc: Oral Oral Oral Oral  SpO2: 100% 100% 100% 98%  Weight:      Height:        Intake/Output Summary (Last 24 hours) at 03/10/2018 1337 Last data filed at 03/10/2018 1248 Gross per 24 hour  Intake 1193.08 ml  Output 300 ml  Net 893.08 ml   Filed Weights   03/09/18 1131  Weight: 64 kg   Examination: Physical Exam:  Constitutional: WN/WD Caucasian male in NAD and appears calm and comfortable with unable to provide a subjective history due to his dementia Eyes: Lids and conjunctivae normal, sclerae anicteric  ENMT: External Ears, Nose appear normal. Grossly normal hearing.   Neck: Appears normal, supple, no cervical masses, normal ROM, no appreciable thyromegaly. No JVD Respiratory: Diminished to auscultation  bilaterally, no wheezing, rales, rhonchi or crackles. Normal respiratory effort and patient is not tachypenic. No accessory muscle use.  Cardiovascular: RRR, Has a slight murmur. S1 and S2 auscultated. No extremity edema.  Abdomen: Soft, non-tender, Distended slightly. No masses palpated. No appreciable hepatosplenomegaly. Bowel sounds positive x4.  GU: Penis red and swollen and has an ulcer.  Musculoskeletal: No clubbing / cyanosis of digits/nails.   Skin: No rashes, lesions, ulcers other then cellulitic genitalia. No induration; Warm and dry.  Neurologic: CN 2-12 grossly intact with no focal deficits. Romberg sign and cerebellar reflexes not assessed.  Psychiatric: Impaired judgment and insight. Very tangential and circumferential and appears anxious.    Data Reviewed: I have personally reviewed following labs and imaging studies  CBC: Recent Labs  Lab 03/08/18 1806 03/09/18 0429 03/10/18 0443  WBC 16.0* 12.4* 11.7*  HGB 13.9 13.3 14.2  HCT 40.3 39.4 42.3  MCV 92.2 94.5 94.2  PLT 237 193 696   Basic Metabolic Panel: Recent Labs  Lab 03/08/18 1806 03/09/18 0429 03/10/18 0443  NA 138 141 139  K 3.8 4.0 4.1  CL 102 110 102  CO2 '25 22 25  ' GLUCOSE 136* 125* 171*  BUN 28* 21 20  CREATININE 0.92 0.85 0.82  CALCIUM 9.9 8.8* 9.7   GFR: Estimated Creatinine Clearance: 67 mL/min (by C-G formula based on SCr of 0.82 mg/dL). Liver Function Tests: Recent Labs  Lab 03/08/18 1806  AST 27  ALT 28  ALKPHOS 67  BILITOT 0.7  PROT 6.8  ALBUMIN 4.2   No results for input(s): LIPASE, AMYLASE in the last 168 hours. No results for input(s): AMMONIA in the last 168 hours. Coagulation Profile: No results for input(s): INR, PROTIME in the last 168 hours. Cardiac Enzymes: No results for input(s): CKTOTAL, CKMB, CKMBINDEX, TROPONINI in the last 168 hours. BNP (last 3 results) No results for input(s): PROBNP in the last 8760 hours. HbA1C: No results for input(s): HGBA1C in the last 72  hours. CBG: Recent Labs  Lab 03/09/18 1720 03/09/18 2209 03/09/18 2310 03/10/18 0721 03/10/18 1148  GLUCAP 257* 79 113* 125* 234*   Lipid Profile: No results for input(s): CHOL, HDL, LDLCALC, TRIG, CHOLHDL, LDLDIRECT in the last 72 hours. Thyroid Function Tests: No results for input(s): TSH, T4TOTAL, FREET4, T3FREE, THYROIDAB in the last 72 hours. Anemia Panel: No results for input(s): VITAMINB12, FOLATE, FERRITIN, TIBC, IRON, RETICCTPCT in the last 72 hours. Sepsis Labs: No results for input(s): PROCALCITON, LATICACIDVEN in the last 168 hours.  No results found for this or any previous visit (from the past 240 hour(s)).   Radiology Studies: No results found.  Scheduled Meds: . aspirin EC  81 mg Oral QHS  . bacitracin   Topical BID  . buPROPion  300 mg Oral Daily  . enoxaparin (LOVENOX) injection  40 mg Subcutaneous Q24H  . feeding supplement (ENSURE ENLIVE)  237 mL Oral BID BM  . insulin aspart  0-9 Units Subcutaneous TID WC  . insulin detemir  30 Units Subcutaneous QHS  . pravastatin  20 mg Oral q1800  . QUEtiapine  12.5 mg Oral BID  . tamsulosin  0.4 mg Oral QHS  . traZODone  150 mg Oral QHS  . valACYclovir  1,000 mg Oral TID   Continuous Infusions: . cefTRIAXone (ROCEPHIN)  IV 1 g (03/09/18 1741)  . vancomycin 1,000 mg (03/10/18 1206)    LOS: 1 day   Kerney Elbe, DO [facility] PAGER is on [place]  If 7PM-7AM, please contact night-coverage [email or web address] Password [email or web address] 03/10/2018, 1:37 PM

## 2018-03-10 NOTE — Progress Notes (Signed)
Initial Nutrition Assessment  DOCUMENTATION CODES:   Not applicable  INTERVENTION:   Glucerna Shake po TID, each supplement provides 220 kcal and 10 grams of protein  NUTRITION DIAGNOSIS:   Increased nutrient needs related to cancer and cancer related treatments as evidenced by estimated needs.  GOAL:   Patient will meet greater than or equal to 90% of their needs  MONITOR:   PO intake, Supplement acceptance, Weight trends, Labs  REASON FOR ASSESSMENT:   Malnutrition Screening Tool    ASSESSMENT:   Patient with PMH significant for CLL followed by Dr. Earlie Server, DM, HLD, BPH, fibromyalgia, and dementia. Presents this admission with worsening pain of groin and lower abdomen over the last 2 weeks. Admitted for cellulitis of male genitalia.    Pt endorses a loss in appetite over the last 2-3 weeks due to a recent change in his diet. States over the last 6 months he has attempted to change to a diabetic diet to improve his A1C. He claims it has been a work in progress and he decided to get serious about these changes 2-3 weeks ago. When asked which types of changes he made, pt unable to specify. RD had a hard time keeping pt on topic. Unsure if pt really had a loss in appetite or not. Friend at bedside reports she is unsure of what he eats.   Per a recent outpatient RD visit for new DM diagnosis 6 months ago, pt had a decent appetite but continuously kept losing weight. They discussed using Boost Glucose Control 1-2 daily in addition to meal to slow weight loss. Pt unable to confirm doing this.   Pt unable to provide UBW. Records indicate pt weighed 147 lb 10/22/17 and 141 lb this admission (insignficant loss for time frame). Nutrition-Focused physical exam completed.   Of note pt's A1C improved from 12.4% to 9.9% (last reading 6 months ago). Would recommend checking recent A1C this admission.   Medications reviewed and include: IV abx Labs reviewed.   NUTRITION - FOCUSED PHYSICAL  EXAM:    Most Recent Value  Orbital Region  No depletion  Upper Arm Region  Mild depletion  Thoracic and Lumbar Region  Unable to assess  Buccal Region  No depletion  Temple Region  Mild depletion  Clavicle Bone Region  Moderate depletion  Clavicle and Acromion Bone Region  Moderate depletion  Scapular Bone Region  No depletion  Dorsal Hand  Moderate depletion  Patellar Region  Moderate depletion  Anterior Thigh Region  Moderate depletion  Posterior Calf Region  Moderate depletion  Edema (RD Assessment)  None     Diet Order:   Diet Order            Diet Carb Modified Fluid consistency: Thin; Room service appropriate? Yes  Diet effective now              EDUCATION NEEDS:   Education needs have been addressed  Skin:  Skin Assessment: Skin Integrity Issues: Skin Integrity Issues:: Other (Comment) Other: sore on penis  Last BM:  03/07/18  Height:   Ht Readings from Last 1 Encounters:  03/09/18 5\' 6"  (1.676 m)    Weight:   Wt Readings from Last 1 Encounters:  03/09/18 64 kg    Ideal Body Weight:  64.5 kg  BMI:  Body mass index is 22.77 kg/m.  Estimated Nutritional Needs:   Kcal:  1700-1900 kcal  Protein:  85-100 grams  Fluid:  >/= 1.7 L/day  Mariana Single RD, LDN Clinical  Nutrition Pager # - [phone]

## 2018-03-11 DIAGNOSIS — C9190 Lymphoid leukemia, unspecified not having achieved remission: Secondary | ICD-10-CM

## 2018-03-11 LAB — COMPREHENSIVE METABOLIC PANEL
ALBUMIN: 3.6 g/dL (ref 3.5–5.0)
ALT: 20 U/L (ref 0–44)
AST: 17 U/L (ref 15–41)
Alkaline Phosphatase: 61 U/L (ref 38–126)
Anion gap: 8 (ref 5–15)
BUN: 22 mg/dL (ref 8–23)
CHLORIDE: 107 mmol/L (ref 98–111)
CO2: 24 mmol/L (ref 22–32)
Calcium: 9 mg/dL (ref 8.9–10.3)
Creatinine, Ser: 0.73 mg/dL (ref 0.61–1.24)
GFR calc Af Amer: >60 mL/min (ref >60)
GFR calc non Af Amer: >60 mL/min (ref >60)
GLUCOSE: 96 mg/dL (ref 70–99)
POTASSIUM: 3.6 mmol/L (ref 3.5–5.1)
Sodium: 139 mmol/L (ref 135–145)
Total Bilirubin: 0.8 mg/dL (ref 0.3–1.2)
Total Protein: 6 g/dL — ABNORMAL LOW (ref 6.5–8.1)

## 2018-03-11 LAB — CBC WITH DIFFERENTIAL/PLATELET
Basophils Absolute: 0 10*3/uL (ref 0.0–0.1)
Basophils Relative: 0 %
Eosinophils Absolute: 0.1 10*3/uL (ref 0.0–0.7)
Eosinophils Relative: 1 %
HCT: 39 % (ref 39.0–52.0)
Hemoglobin: 12.9 g/dL — ABNORMAL LOW (ref 13.0–17.0)
LYMPHS ABS: 6.3 10*3/uL — AB (ref 0.7–4.0)
Lymphocytes Relative: 64 %
MCH: 31.2 pg (ref 26.0–34.0)
MCHC: 33.1 g/dL (ref 30.0–36.0)
MCV: 94.2 fL (ref 78.0–100.0)
MONO ABS: 0.5 10*3/uL (ref 0.1–1.0)
MONOS PCT: 5 %
NEUTROS PCT: 30 %
Neutro Abs: 2.9 10*3/uL (ref 1.7–7.7)
PLATELETS: 205 10*3/uL (ref 150–400)
RBC: 4.14 MIL/uL — AB (ref 4.22–5.81)
RDW: 12.6 % (ref 11.5–15.5)
WBC: 9.8 10*3/uL (ref 4.0–10.5)

## 2018-03-11 LAB — GLUCOSE, CAPILLARY
GLUCOSE-CAPILLARY: 245 mg/dL — AB (ref 70–99)
GLUCOSE-CAPILLARY: 224 mg/dL — AB (ref 70–99)
Glucose-Capillary: 69 mg/dL — ABNORMAL LOW (ref 70–99)
Glucose-Capillary: 92 mg/dL (ref 70–99)

## 2018-03-11 LAB — URINE CULTURE: Culture: NO GROWTH

## 2018-03-11 LAB — PHOSPHORUS: Phosphorus: 3.6 mg/dL (ref 2.5–4.6)

## 2018-03-11 LAB — MAGNESIUM: Magnesium: 1.7 mg/dL (ref 1.7–2.4)

## 2018-03-11 MED ORDER — AMOXICILLIN-POT CLAVULANATE 875-125 MG PO TABS: 1.0000 | ORAL_TABLET | Freq: Two times a day (BID) | ORAL | 0 refills | Status: AC

## 2018-03-11 MED ORDER — AMOXICILLIN-POT CLAVULANATE 875-125 MG PO TABS
1.0000 | ORAL_TABLET | Freq: Two times a day (BID) | ORAL | Status: DC
  Administered 2018-03-11: 1 via ORAL
  Filled 2018-03-11: qty 1

## 2018-03-11 MED ORDER — DOXYCYCLINE HYCLATE 100 MG PO TABS: 100.0000 mg | ORAL_TABLET | Freq: Two times a day (BID) | ORAL | 0 refills | Status: AC

## 2018-03-11 MED ORDER — GLUCERNA SHAKE PO LIQD: 237.0000 mL | Freq: Three times a day (TID) | ORAL | 0 refills | Status: DC

## 2018-03-11 MED ORDER — BACITRACIN ZINC 500 UNIT/GM EX OINT: TOPICAL_OINTMENT | Freq: Two times a day (BID) | CUTANEOUS | 0 refills | Status: DC

## 2018-03-11 MED ORDER — DOXYCYCLINE HYCLATE 100 MG PO TABS
100.0000 mg | ORAL_TABLET | Freq: Two times a day (BID) | ORAL | Status: DC
  Administered 2018-03-11: 100 mg via ORAL
  Filled 2018-03-11: qty 1

## 2018-03-11 MED ORDER — HYDROCODONE-ACETAMINOPHEN 5-325 MG PO TABS: 1.0000 | ORAL_TABLET | ORAL | 0 refills | Status: DC | PRN

## 2018-03-11 NOTE — Progress Notes (Signed)
Report called to [facility] [phone] and report was given to [facility].

## 2018-03-11 NOTE — Progress Notes (Signed)
CSW faxed d/c summary and the FL2 via [place]. CSW confirmed received by the staff.  [facility] called the transport the patient back to the facility.   Kathrin Greathouse, Marlinda Mike, MSW Clinical Social Worker  [phone] 03/11/2018  2:49 PM

## 2018-03-11 NOTE — Clinical Social Work Note (Signed)
Clinical Social Work Assessment  Patient Details  Name: Hayden [name] MRN: [id number] Date of Birth: 30-Oct-1940  Date of referral:  03/11/18               Reason for consult:  Facility Placement                Permission sought to share information with:  Facility Sport and exercise psychologist, Family Supports Permission granted to share information::  Yes, Verbal Permission Granted  Name::        Agency::  [place]   Relationship::  Sons  Contact Information:     Housing/Transportation Living arrangements for the past 2 months:  [place] of Information:  Patient, Facility Patient Interpreter Needed:  None Criminal Activity/Legal Involvement Pertinent to Current Situation/Hospitalization:  No - Comment as needed Significant Relationships:  Adult Children, Spouse, Community Support Lives with:  Facility Resident Do you feel safe going back to the place where you live?  Yes Need for family participation in patient care:  No (Coment)  Care giving concerns:   No care giving concerns. Patient will return to [facility].  Patient admitted for pain in the Groin.  Social Worker assessment / plan:  Patient alert to self and place. Patient is a new resident at [facility] and will return at discharge. CSW confirm facility staff patient is able to return. Per medical technician staff the patient is independent and can walk without assistance. The patient reports he can bathe, dress and feed self. The facility request an updated FL2 at discharge.  CSW called and left voicemail for patient son informing him of the patient discharge back to facility. Nursing staff also called, both sons are currently out of town and cannot transport the patient back. [facility] to transport back to facility.  Plan: [facility] FL2 complete.   Employment status:  Retired Forensic scientist:  Medicare PT Recommendations:  No Follow Up Information /  Referral to community resources:     Patient/Family's Response to care:  Agreeable to care.   Patient/Family's Understanding of and Emotional Response to Diagnosis, Current Treatment, and Prognosis:  No family at bedside.   Emotional Assessment Appearance:  Appears stated age Attitude/Demeanor/Rapport:    Affect (typically observed):  Accepting Orientation:  Oriented to Self, Oriented to Place, Oriented to  Time Alcohol / Substance use:  Not Applicable Psych involvement (Current and /or in the community):  No (Comment)  Discharge Needs  Concerns to be addressed:  Discharge Planning Concerns Readmission within the last 30 days:  No Current discharge risk:  None Barriers to Discharge:  No Barriers Identified   Lia Hopping, [place] 03/11/2018, 12:31 PM

## 2018-03-11 NOTE — Progress Notes (Signed)
Pt discharged from facility was picked up by [facility] and taken to [facility].

## 2018-03-11 NOTE — Discharge Summary (Signed)
Physician Discharge Summary  Hayden [name] YFV:[id number] DOB: 12/27/1940 DOA: 03/08/2018  PCP: Hayden Fairly, MD (Inactive)  Admit date: 03/08/2018 Discharge date: 03/11/2018  Admitted From: ALF Disposition: ALF  Recommendations for Outpatient Follow-up:  1. Follow up with PCP in 1-2 weeks 2. Follow up with Urology within 1 week  3. Follow up with Neurology within 1 week 4. Please obtain CMP/CBC, Mag, Phos in one week 5. Please follow up on the following pending results:  Home Health: No Equipment/Devices: None    Discharge Condition: Stable CODE STATUS: FULL CODE Diet recommendation: Heart Healthy Carb Modified   Brief/Interim Summary: HPI per Hayden [name] [name] on 03/08/18 [name] a 77 y.o.malewith medical history significant ofCLL followed by Dr. Earlie [name], DM type II, HLD, BPH, fibromyalgia, and dementia;who presented with worsening pain of groinand lower abdomen over the last 2 weeks. History from the patient is fair although exact dates are not clear. He describes it as a steady burning pain that he rates as a 8+out of 10 on the pain scale. There is a sore on the left side of the shaft of the penis that was present during the same time. He reports associated symptoms of redness, tenderness to the touch, and urinary frequency. Denies any recent trauma,pain of his testicles, fever, chills, cough, shortness of breath, nausea, vomiting, diarrhea, or leg swelling. Last fall reported approximately 2 months ago.  **Feeling better and not in any pain currently. Was Fixated on trying to find a PCP and repeating himself several times. Cellulitis improving on Abx and Local Wound Care. Checking GC Probe and will need to follow up with Urology and PCP as an outpatient.   Discharge Diagnoses:  Principal Problem:   Cellulitis of male genitalia Active Problems:   CLL (chronic lymphocytic leukemia) (HCC)   Type 2 diabetes mellitus with complication (HCC)   Altered  mental status   Mild dementia   Depression   BPH (benign prostatic hyperplasia)   Leukocytosis   Cellulitis, scrotum  Acute cellulitis of the male genitalia with leukocytosis in a patient with Frequent UTI's, Improving -Patient presented with complaints of scrotal and penile pain along with redness -Patient with small ulcerations on penile shaft as well as erythema on the penis as well as the scrotal skin -Wound care consulted:Recommend a thin layer of bacitracin ointment applied to the ulcer twice daily after cleansing. Use intra-dry Ag around the penis and an inguinal areas and over scrotum. -ESR 5, CRP <0.8 -Continue IV Vancomycin and Ceftriaxone for now  -Continue pain control as needed with Hydrocodone-Acetaminophen -WBC improving from admission, 16 down to 9.8 -UA shows many bacteria,>50,large leukocytes, positive nitrites; Last two Cx's Positive for Klebsiella Pneumoniae and Pseudomonas Aeruginosa -Urine culture this admission showed No Growth -Check chlamydia and gonorrhea probe and is pending -C/w Valcyclovir 1000 mg po Daily  -Will need Urology Follow up as an outpatient  -PT recommending no follow up   Diabetes mellitus, type II -Last hemoglobin A1c is unknown so will need one as an outpatien .  - CBG's ranging from 69-245 -Hypoglycemic protocol -Continue Levemir per home regimen with 30 units sq qHS - CBGsCBGs q. before meals with sensitive SSI hospitalized and resume home p.o. medications including Januvia -Follow-up with primary care physician for further blood sugar management  Chronic Lymphocytic Leukemia -Patient with stage 0 CLL not currently receiving treatment.  -Followed in the outpatient setting by Dr. Earlie [name] on an as-needed basis per review of office records from 08/12/2017. -Continue outpatient  follow-up   Depression -Continue Buprorpion 300 mg po Daily, Quetiapine 12.5 mg po BID qHS, Trazodone 150 mg po qHS  Dementia -Patient followed by  Neurology, found to have mild dementia  -Was on Donezepil however does not appear to be taking this medication currently -Has been very Circumferential and Tangential in discussion and repeats himself multiple times and is fixated on obtaining a PCP on my examination yesterday. Improved today  -Follow up with Neurology as an outpatient    BPH -Continue Tamsulosin 0.4 mg po qHS   Hyperlipidemia -Continue Home Lovastatin 20 mg po Daily   Normocytic Anemia -Patient's Hb/Hct went from 13.3/39.4 -> 14.2/42.3 -> 12.9/39.0 -Check Anemia Panel in the outpatient setting -Continue to Watch for S/Sx of Bleeding -Repeat CBC as an outpatient   Discharge Instructions Discharge Instructions    Call MD for:  difficulty breathing, headache or visual disturbances   Complete by:  As directed    Call MD for:  extreme fatigue   Complete by:  As directed    Call MD for:  hives   Complete by:  As directed    Call MD for:  persistant dizziness or light-headedness   Complete by:  As directed    Call MD for:  persistant nausea and vomiting   Complete by:  As directed    Call MD for:  redness, tenderness, or signs of infection (pain, swelling, redness, odor or green/yellow discharge around incision site)   Complete by:  As directed    Call MD for:  severe uncontrolled pain   Complete by:  As directed    Call MD for:  temperature >100.4   Complete by:  As directed    Diet - low sodium heart healthy   Complete by:  As directed    Discharge instructions   Complete by:  As directed    You were cared for by a hospitalist during your hospital stay. If you have any questions about your discharge medications or the care you received while you were in the hospital after you are discharged, you can call the unit and ask to speak with the hospitalist on call if the hospitalist that took care of you is not available. Once you are discharged, your primary care physician will handle any further medical issues.  Please note that NO REFILLS for any discharge medications will be authorized once you are discharged, as it is imperative that you return to your primary care physician (or establish a relationship with a primary care physician if you do not have one) for your aftercare needs so that they can reassess your need for medications and monitor your lab values.  Follow up with PCP and Urology as an outpatient. Have provider follow up on GC Screen. Take all medications as prescribed. If symptoms change or worsen please return to the ED for evaluation   Increase activity slowly   Complete by:  As directed      Allergies as of 03/11/2018      Reactions   Bee Venom Other (See Comments)   Bump where stung and took days to go away   Glucophage [metformin] Diarrhea   "i will never take it again -stool incontinent "   Shellfish Allergy Swelling   Grass Extracts [gramineae Pollens]    Tobacco [nicotiana Tabacum]       Medication List    STOP taking these medications   ciprofloxacin 500 MG tablet Commonly known as:  CIPRO   donepezil 10 MG tablet  Commonly known as:  ARICEPT     TAKE these medications   amoxicillin-clavulanate 875-125 MG tablet Commonly known as:  AUGMENTIN Take 1 tablet by mouth every 12 (twelve) hours for 7 days.   aspirin 81 MG tablet Take 81 mg by mouth at bedtime.   bacitracin ointment Apply topically 2 (two) times daily.   buPROPion 300 MG 24 hr tablet Commonly known as:  WELLBUTRIN XL Take 300 mg by mouth daily.   cetirizine 10 MG tablet Commonly known as:  ZYRTEC Take 10 mg by mouth daily.   doxycycline 100 MG tablet Commonly known as:  VIBRA-TABS Take 1 tablet (100 mg total) by mouth every 12 (twelve) hours for 7 days.   feeding supplement (GLUCERNA SHAKE) Liqd Take 237 mLs by mouth 3 (three) times daily between meals.   fish oil-omega-3 fatty acids 1000 MG capsule Take 1 g by mouth 2 (two) times daily.   HUMALOG KWIKPEN 100 UNIT/ML KiwkPen Generic  drug:  insulin lispro Inject 3-5 Units into the skin 3 (three) times daily before meals.   HYDROcodone-acetaminophen 5-325 MG tablet Commonly known as:  NORCO/VICODIN Take 1 tablet by mouth every 4 (four) hours as needed for moderate pain.   JANUVIA 100 MG tablet Generic drug:  sitaGLIPtin Take 100 mg by mouth daily.   LEVEMIR FLEXTOUCH 100 UNIT/ML Pen Generic drug:  Insulin Detemir Inject 30 Units into the skin at bedtime.   lovastatin 20 MG tablet Commonly known as:  MEVACOR Take 20 mg by mouth at bedtime.   naproxen sodium 220 MG tablet Commonly known as:  ALEVE Take 220 mg by mouth every 12 (twelve) hours as needed (pain).   QUEtiapine 25 MG tablet Commonly known as:  SEROQUEL Take 12.5 mg by mouth 2 (two) times daily.   tamsulosin 0.4 MG Caps capsule Commonly known as:  FLOMAX Take 0.4 mg by mouth at bedtime.   traZODone 100 MG tablet Commonly known as:  DESYREL Take 150 mg by mouth at bedtime.   valACYclovir 1000 MG tablet Commonly known as:  VALTREX Take 1,000 mg by mouth 3 (three) times daily.   vitamin B-12 1000 MCG tablet Commonly known as:  CYANOCOBALAMIN Take 1,000 mcg by mouth daily.   Vitamin D3 1000 units Caps Take 1 capsule by mouth at bedtime.   zolpidem 5 MG tablet Commonly known as:  AMBIEN Take 5 mg by mouth at bedtime as needed for sleep.       Contact information for follow-up providers    Hayden Fairly, MD. Call.   Specialty:  Specialist Why:  Follow up within 1 week  Contact information: [place] [address] [phone]        [facility]. Call.   Why:  Follow up within 1 week Contact information: [place] [phone]           Contact information for after-discharge care    Destination    [facility] .   Service:  Assisted Living Contact information: [place] [address] [phone]                  Allergies  Allergen Reactions  . Bee Venom Other (See Comments)    Bump where stung and took days to go away  . Glucophage [Metformin] Diarrhea    "i will never take it again -stool incontinent "  . Shellfish Allergy Swelling  . Grass Extracts [Gramineae  Pollens]   . Tobacco [Nicotiana Tabacum]    Consultations:  None  Procedures/Studies: No results found.  Subjective: Seen and examined at bedside and states that his scrotal and penile pain was improved.  Thinks it is less red.  No chest pain, shortness breath, nausea, vomiting.  Was ambulate in the halls without issues.  No other concerns or complaints at this time.  Discharge Exam: Vitals:   03/10/18 2053 03/11/18 0651  BP: 132/71 129/66  Pulse: 81 77  Resp: 18 18  Temp: 99.6 F (37.6 C) 97.9 F (36.6 C)  SpO2: 98% 100%   Vitals:   03/10/18 0630 03/10/18 1249 03/10/18 2053 03/11/18 0651  BP: 119/69 131/60 132/71 129/66  Pulse: 83 86 81 77  Resp: '18 17 18 18  ' Temp: 97.6 F (36.4 C) 98.6 F (37 C) 99.6 F (37.6 C) 97.9 F (36.6 C)  TempSrc: Oral Oral Oral Oral  SpO2: 100% 98% 98% 100%  Weight:      Height:       General: Pt is alert, awake, not in acute distress Cardiovascular: RRR, S1/S2 +, no rubs, no gallops Respiratory: CTA bilaterally, no wheezing, no rhonchi Abdominal: Soft, NT, ND, bowel sounds + GU: penis and scrotum still slightly erythematous and there is some ulcers on the penile shaft and scrotum Extremities: no edema, no cyanosis  The results of significant diagnostics from this hospitalization (including imaging, microbiology, ancillary and laboratory) are listed below for reference.    Microbiology: Recent Results (from the past 240 hour(s))  Urine Culture     Status: None   Collection Time: 03/09/18  5:45 PM  Result Value Ref Range Status   Specimen Description   Final    URINE, RANDOM Performed at [place] [address] [place], [place]  [address]    Special Requests   Final    NONE Performed at [facility], [place] [address], [place], [place] [address]    Culture   Final    NO GROWTH Performed at [facility], [place] [address], [place], [place] [address]    Report Status 03/11/2018 FINAL  Final    Labs: BNP (last 3 results) No results for input(s): BNP in the last 8760 hours. Basic Metabolic Panel: Recent Labs  Lab 03/08/18 1806 03/09/18 0429 03/10/18 0443 03/11/18 0418  NA 138 141 139 139  K 3.8 4.0 4.1 3.6  CL 102 110 102 107  CO2 '25 22 25 24  ' GLUCOSE 136* 125* 171* 96  BUN 28* '21 20 22  ' CREATININE 0.92 0.85 0.82 0.73  CALCIUM 9.9 8.8* 9.7 9.0  MG  --   --   --  1.7  PHOS  --   --   --  3.6   Liver Function Tests: Recent Labs  Lab 03/08/18 1806 03/11/18 0418  AST 27 17  ALT 28 20  ALKPHOS 67 61  BILITOT 0.7 0.8  PROT 6.8 6.0*  ALBUMIN 4.2 3.6   No results for input(s): LIPASE, AMYLASE in the last 168 hours. No results for input(s): AMMONIA in the last 168 hours. CBC: Recent Labs  Lab 03/08/18 1806 03/09/18 0429 03/10/18 0443 03/11/18 0418  WBC 16.0* 12.4* 11.7* 9.8  NEUTROABS  --   --   --  2.9  HGB 13.9 13.3 14.2 12.9*  HCT 40.3 39.4 42.3 39.0  MCV 92.2 94.5 94.2 94.2  PLT 237 193 222 205   Cardiac Enzymes: No results for input(s): CKTOTAL, CKMB,  CKMBINDEX, TROPONINI in the last 168 hours. BNP: Invalid input(s): POCBNP CBG: Recent Labs  Lab 03/10/18 1658 03/10/18 2135 03/11/18 0749 03/11/18 0813 03/11/18 1222  GLUCAP 178* 116* 69* 92 245*   D-Dimer No results for input(s): DDIMER in the last 72 hours. Hgb A1c No results for input(s): HGBA1C in the last 72 hours. Lipid Profile No results for input(s): CHOL, HDL, LDLCALC, TRIG, CHOLHDL, LDLDIRECT in the last 72 hours. Thyroid function studies No results for input(s): TSH, T4TOTAL, T3FREE, THYROIDAB in the last 72 hours.  Invalid input(s): FREET3 Anemia work up No results for input(s): VITAMINB12,  FOLATE, FERRITIN, TIBC, IRON, RETICCTPCT in the last 72 hours. Urinalysis    Component Value Date/Time   COLORURINE YELLOW 03/08/2018 1942   APPEARANCEUR HAZY (A) 03/08/2018 1942   LABSPEC 1.014 03/08/2018 1942   PHURINE 6.0 03/08/2018 1942   GLUCOSEU NEGATIVE 03/08/2018 1942   HGBUR SMALL (A) 03/08/2018 1942   BILIRUBINUR NEGATIVE 03/08/2018 1942   KETONESUR NEGATIVE 03/08/2018 1942   PROTEINUR NEGATIVE 03/08/2018 1942   NITRITE POSITIVE (A) 03/08/2018 1942   LEUKOCYTESUR LARGE (A) 03/08/2018 1942   Sepsis Labs Invalid input(s): PROCALCITONIN,  WBC,  LACTICIDVEN Microbiology Recent Results (from the past 240 hour(s))  Urine Culture     Status: None   Collection Time: 03/09/18  5:45 PM  Result Value Ref Range Status   Specimen Description   Final    URINE, RANDOM Performed at [facility], [place] [address] [place], [place] [address]    Special Requests   Final    NONE Performed at [facility], [place] [address], [place], [place] [address]    Culture   Final    NO GROWTH Performed at [facility], [place] [address] [place], [place] [address]    Report Status 03/11/2018 FINAL  Final   Time coordinating discharge: 35 minutes  SIGNED:  Kerney Elbe, DO [facility] 03/11/2018, 1:39 PM Pager is on [place]  If 7PM-7AM, please contact night-coverage [email or web address] Password [email or web address]

## 2018-03-11 NOTE — NC FL2 (Addendum)
[place] LEVEL OF CARE SCREENING TOOL     IDENTIFICATION  Patient Name: Hayden [name] Birthdate: 1941/04/09 Sex: male Admission Date (Current Location): 03/08/2018  [facility] and [place] Number:  Herbalist and Address:  [facility],  [place], [place]      Provider Number: [id number]  Attending Physician Name and Address:  Kerney Elbe, DO  Relative Name and Phone Number:       Current Level of Care: Hospital Recommended Level of Care: [place] Prior Approval Number:    Date Approved/Denied:   PASRR Number:    Discharge Plan: ([place] )    Current Diagnoses: Patient Active Problem List   Diagnosis Date Noted  . Cellulitis, scrotum 03/09/2018  . Cellulitis of male genitalia 03/08/2018  . Depression 03/08/2018  . BPH (benign prostatic hyperplasia) 03/08/2018  . Leukocytosis 03/08/2018  . Mild dementia 10/23/2017  . UTI (urinary tract infection) 09/06/2017  . Hyperglycemia 09/06/2017  . ARF (acute renal failure) ([place]) 09/06/2017  . Altered mental status 09/06/2017  . Type 2 diabetes mellitus with complication ([place]) 69/48/5462  . CLL (chronic lymphocytic leukemia) ([place]) 09/01/2013    Orientation RESPIRATION BLADDER Height & Weight     Self, Time, Place  Normal Continent Weight: 141 lb 1.5 oz (64 kg) Height:  5\' 6"  (167.6 cm)  BEHAVIORAL SYMPTOMS/MOOD NEUROLOGICAL BOWEL NUTRITION STATUS      Continent Diet(Carb Modified)  AMBULATORY STATUS COMMUNICATION OF NEEDS Skin   Independent(Uses Cane) Verbally Other (Comment)(Wound-Penis)                       Personal Care Assistance Level of Assistance  Bathing, Feeding, Dressing Bathing Assistance: Limited  Feeding assistance: Independent Dressing Assistance:Limited      Functional Limitations Info  Sight, Hearing, Speech Sight Info: Impaired(Wears Glasses ) Hearing Info: Adequate Speech Info: Adequate     SPECIAL CARE FACTORS FREQUENCY                       Contractures Contractures Info: Not present    Additional Factors Info  Code Status, Allergies, Psychotropic, Insulin Sliding Scale Code Status Info: Fullcode Allergies Info: Bee Venom, Glucophage Metformin, Shellfish Allergy, Grass Extracts [place] Pollens, Tobacco Nicotiana Tabacum Psychotropic Info: Wellbutrin, Seroquel, Trazadone Insulin Sliding Scale Info: 3x's a day w/ meals    insulin detemir (LEVEMIR) injection 30 Units      Current Medications (03/11/2018):  This is the current hospital active medication list Current Facility-Administered Medications  Medication Dose Route Frequency Provider Last Rate Last Dose  . acetaminophen (TYLENOL) tablet 650 mg  650 mg Oral Q6H PRN Fuller Plan A, MD       Or  . acetaminophen (TYLENOL) suppository 650 mg  650 mg Rectal Q6H PRN Fuller Plan A, MD      . amoxicillin-clavulanate (AUGMENTIN) 875-125 MG per tablet 1 tablet  1 tablet Oral Q12H [name], Omair Latif, DO      . aspirin EC tablet 81 mg  81 mg Oral QHS Fuller Plan A, MD   81 mg at 03/10/18 2247  . bacitracin ointment   Topical BID Cristal Ford, DO      . buPROPion (WELLBUTRIN XL) 24 hr tablet 300 mg  300 mg Oral Daily Fuller Plan A, MD   300 mg at 03/11/18 0917  . doxycycline (VIBRA-TABS) tablet 100 mg  100 mg Oral Q12H [name], Omair [place], [place]      .  enoxaparin (LOVENOX) injection 40 mg  40 mg Subcutaneous Q24H Smith, Rondell A, MD   40 mg at 03/10/18 2250  . feeding supplement (GLUCERNA SHAKE) (GLUCERNA SHAKE) liquid 237 mL  237 mL Oral TID BM [name], Omair Latif, DO   237 mL at 03/11/18 9166  . hydrALAZINE (APRESOLINE) injection 10 mg  10 mg Intravenous Q4H PRN Fuller Plan A, MD      . HYDROcodone-acetaminophen (NORCO/VICODIN) 5-325 MG per tablet 1 tablet  1 tablet Oral Q4H PRN Smith, Rondell A, MD      . insulin aspart (novoLOG) injection 0-9 Units  0-9 Units Subcutaneous TID WC Fuller Plan A,  MD   2 Units at 03/10/18 1712  . insulin detemir (LEVEMIR) injection 30 Units  30 Units Subcutaneous QHS Fuller Plan A, MD   30 Units at 03/10/18 2250  . ondansetron (ZOFRAN) tablet 4 mg  4 mg Oral Q6H PRN Fuller Plan A, MD       Or  . ondansetron (ZOFRAN) injection 4 mg  4 mg Intravenous Q6H PRN Smith, Rondell A, MD      . pravastatin (PRAVACHOL) tablet 20 mg  20 mg Oral q1800 Fuller Plan A, MD   20 mg at 03/10/18 1712  . QUEtiapine (SEROQUEL) tablet 12.5 mg  12.5 mg Oral BID Fuller Plan A, MD   12.5 mg at 03/11/18 0917  . tamsulosin (FLOMAX) capsule 0.4 mg  0.4 mg Oral QHS Smith, Rondell A, MD   0.4 mg at 03/10/18 2247  . traZODone (DESYREL) tablet 150 mg  150 mg Oral QHS Smith, Rondell A, MD   150 mg at 03/10/18 2247  . valACYclovir (VALTREX) tablet 1,000 mg  1,000 mg Oral TID Fuller Plan A, MD   1,000 mg at 03/11/18 0600     Discharge Medications: Please see discharge summary for a list of discharge medications.  Relevant Imaging Results:  Relevant Lab Results:   Additional Information SSN   [id number]  Lia Hopping, LCSW

## 2018-03-12 LAB — GC/CHLAMYDIA PROBE AMP (~~LOC~~) NOT AT ARMC
CHLAMYDIA, DNA PROBE: NEGATIVE
Neisseria Gonorrhea: NEGATIVE

## 2018-03-15 DIAGNOSIS — E11319 Type 2 diabetes mellitus with unspecified diabetic retinopathy without macular edema: Secondary | ICD-10-CM | POA: Diagnosis not present

## 2018-03-15 DIAGNOSIS — Z7689 Persons encountering health services in other specified circumstances: Secondary | ICD-10-CM | POA: Diagnosis not present

## 2018-03-15 DIAGNOSIS — N4822 Cellulitis of corpus cavernosum and penis: Secondary | ICD-10-CM | POA: Diagnosis not present

## 2018-03-15 DIAGNOSIS — F418 Other specified anxiety disorders: Secondary | ICD-10-CM | POA: Diagnosis not present

## 2018-03-17 DIAGNOSIS — E1169 Type 2 diabetes mellitus with other specified complication: Secondary | ICD-10-CM | POA: Diagnosis not present

## 2018-03-17 DIAGNOSIS — F418 Other specified anxiety disorders: Secondary | ICD-10-CM | POA: Diagnosis not present

## 2018-03-17 DIAGNOSIS — E11319 Type 2 diabetes mellitus with unspecified diabetic retinopathy without macular edema: Secondary | ICD-10-CM | POA: Diagnosis not present

## 2018-03-17 DIAGNOSIS — G3184 Mild cognitive impairment, so stated: Secondary | ICD-10-CM | POA: Diagnosis not present

## 2018-03-17 DIAGNOSIS — Z23 Encounter for immunization: Secondary | ICD-10-CM | POA: Diagnosis not present

## 2018-03-17 DIAGNOSIS — E1142 Type 2 diabetes mellitus with diabetic polyneuropathy: Secondary | ICD-10-CM | POA: Diagnosis not present

## 2018-03-17 DIAGNOSIS — I10 Essential (primary) hypertension: Secondary | ICD-10-CM | POA: Diagnosis not present

## 2018-03-23 DIAGNOSIS — I10 Essential (primary) hypertension: Secondary | ICD-10-CM | POA: Diagnosis not present

## 2018-03-23 DIAGNOSIS — E11319 Type 2 diabetes mellitus with unspecified diabetic retinopathy without macular edema: Secondary | ICD-10-CM | POA: Diagnosis not present

## 2018-03-23 DIAGNOSIS — G3184 Mild cognitive impairment, so stated: Secondary | ICD-10-CM | POA: Diagnosis not present

## 2018-03-23 DIAGNOSIS — E1142 Type 2 diabetes mellitus with diabetic polyneuropathy: Secondary | ICD-10-CM | POA: Diagnosis not present

## 2018-03-23 DIAGNOSIS — F418 Other specified anxiety disorders: Secondary | ICD-10-CM | POA: Diagnosis not present

## 2018-03-23 DIAGNOSIS — E1169 Type 2 diabetes mellitus with other specified complication: Secondary | ICD-10-CM | POA: Diagnosis not present

## 2018-03-24 DIAGNOSIS — E11319 Type 2 diabetes mellitus with unspecified diabetic retinopathy without macular edema: Secondary | ICD-10-CM | POA: Diagnosis not present

## 2018-03-24 DIAGNOSIS — F418 Other specified anxiety disorders: Secondary | ICD-10-CM | POA: Diagnosis not present

## 2018-03-24 DIAGNOSIS — E119 Type 2 diabetes mellitus without complications: Secondary | ICD-10-CM | POA: Diagnosis not present

## 2018-03-24 DIAGNOSIS — G3184 Mild cognitive impairment, so stated: Secondary | ICD-10-CM | POA: Diagnosis not present

## 2018-03-24 DIAGNOSIS — E1169 Type 2 diabetes mellitus with other specified complication: Secondary | ICD-10-CM | POA: Diagnosis not present

## 2018-03-24 DIAGNOSIS — D649 Anemia, unspecified: Secondary | ICD-10-CM | POA: Diagnosis not present

## 2018-03-24 DIAGNOSIS — E1142 Type 2 diabetes mellitus with diabetic polyneuropathy: Secondary | ICD-10-CM | POA: Diagnosis not present

## 2018-03-24 DIAGNOSIS — I10 Essential (primary) hypertension: Secondary | ICD-10-CM | POA: Diagnosis not present

## 2018-03-25 DIAGNOSIS — N401 Enlarged prostate with lower urinary tract symptoms: Secondary | ICD-10-CM | POA: Diagnosis not present

## 2018-03-25 DIAGNOSIS — R350 Frequency of micturition: Secondary | ICD-10-CM | POA: Diagnosis not present

## 2018-03-25 DIAGNOSIS — N3281 Overactive bladder: Secondary | ICD-10-CM | POA: Diagnosis not present

## 2018-03-25 DIAGNOSIS — R351 Nocturia: Secondary | ICD-10-CM | POA: Diagnosis not present

## 2018-03-25 DIAGNOSIS — R3914 Feeling of incomplete bladder emptying: Secondary | ICD-10-CM | POA: Diagnosis not present

## 2018-03-26 DIAGNOSIS — I10 Essential (primary) hypertension: Secondary | ICD-10-CM | POA: Diagnosis not present

## 2018-03-26 DIAGNOSIS — E1142 Type 2 diabetes mellitus with diabetic polyneuropathy: Secondary | ICD-10-CM | POA: Diagnosis not present

## 2018-03-26 DIAGNOSIS — E1169 Type 2 diabetes mellitus with other specified complication: Secondary | ICD-10-CM | POA: Diagnosis not present

## 2018-03-26 DIAGNOSIS — G3184 Mild cognitive impairment, so stated: Secondary | ICD-10-CM | POA: Diagnosis not present

## 2018-03-26 DIAGNOSIS — E11319 Type 2 diabetes mellitus with unspecified diabetic retinopathy without macular edema: Secondary | ICD-10-CM | POA: Diagnosis not present

## 2018-03-26 DIAGNOSIS — F418 Other specified anxiety disorders: Secondary | ICD-10-CM | POA: Diagnosis not present

## 2018-03-30 DIAGNOSIS — F418 Other specified anxiety disorders: Secondary | ICD-10-CM | POA: Diagnosis not present

## 2018-03-30 DIAGNOSIS — E1169 Type 2 diabetes mellitus with other specified complication: Secondary | ICD-10-CM | POA: Diagnosis not present

## 2018-03-30 DIAGNOSIS — G3184 Mild cognitive impairment, so stated: Secondary | ICD-10-CM | POA: Diagnosis not present

## 2018-03-30 DIAGNOSIS — I10 Essential (primary) hypertension: Secondary | ICD-10-CM | POA: Diagnosis not present

## 2018-03-30 DIAGNOSIS — E1142 Type 2 diabetes mellitus with diabetic polyneuropathy: Secondary | ICD-10-CM | POA: Diagnosis not present

## 2018-03-30 DIAGNOSIS — E11319 Type 2 diabetes mellitus with unspecified diabetic retinopathy without macular edema: Secondary | ICD-10-CM | POA: Diagnosis not present

## 2018-03-31 DIAGNOSIS — F418 Other specified anxiety disorders: Secondary | ICD-10-CM | POA: Diagnosis not present

## 2018-03-31 DIAGNOSIS — I10 Essential (primary) hypertension: Secondary | ICD-10-CM | POA: Diagnosis not present

## 2018-03-31 DIAGNOSIS — E1169 Type 2 diabetes mellitus with other specified complication: Secondary | ICD-10-CM | POA: Diagnosis not present

## 2018-03-31 DIAGNOSIS — E1142 Type 2 diabetes mellitus with diabetic polyneuropathy: Secondary | ICD-10-CM | POA: Diagnosis not present

## 2018-03-31 DIAGNOSIS — G3184 Mild cognitive impairment, so stated: Secondary | ICD-10-CM | POA: Diagnosis not present

## 2018-03-31 DIAGNOSIS — E11319 Type 2 diabetes mellitus with unspecified diabetic retinopathy without macular edema: Secondary | ICD-10-CM | POA: Diagnosis not present

## 2018-04-02 DIAGNOSIS — E11319 Type 2 diabetes mellitus with unspecified diabetic retinopathy without macular edema: Secondary | ICD-10-CM | POA: Diagnosis not present

## 2018-04-02 DIAGNOSIS — E1142 Type 2 diabetes mellitus with diabetic polyneuropathy: Secondary | ICD-10-CM | POA: Diagnosis not present

## 2018-04-02 DIAGNOSIS — G3184 Mild cognitive impairment, so stated: Secondary | ICD-10-CM | POA: Diagnosis not present

## 2018-04-02 DIAGNOSIS — F418 Other specified anxiety disorders: Secondary | ICD-10-CM | POA: Diagnosis not present

## 2018-04-02 DIAGNOSIS — I10 Essential (primary) hypertension: Secondary | ICD-10-CM | POA: Diagnosis not present

## 2018-04-02 DIAGNOSIS — E1169 Type 2 diabetes mellitus with other specified complication: Secondary | ICD-10-CM | POA: Diagnosis not present

## 2018-04-05 DIAGNOSIS — R296 Repeated falls: Secondary | ICD-10-CM | POA: Diagnosis not present

## 2018-04-05 DIAGNOSIS — S20212A Contusion of left front wall of thorax, initial encounter: Secondary | ICD-10-CM | POA: Diagnosis not present

## 2018-04-05 DIAGNOSIS — E1165 Type 2 diabetes mellitus with hyperglycemia: Secondary | ICD-10-CM | POA: Diagnosis not present

## 2018-04-05 DIAGNOSIS — E1142 Type 2 diabetes mellitus with diabetic polyneuropathy: Secondary | ICD-10-CM | POA: Diagnosis not present

## 2018-04-05 DIAGNOSIS — E11319 Type 2 diabetes mellitus with unspecified diabetic retinopathy without macular edema: Secondary | ICD-10-CM | POA: Diagnosis not present

## 2018-04-05 DIAGNOSIS — F418 Other specified anxiety disorders: Secondary | ICD-10-CM | POA: Diagnosis not present

## 2018-04-05 DIAGNOSIS — Z794 Long term (current) use of insulin: Secondary | ICD-10-CM | POA: Diagnosis not present

## 2018-04-06 DIAGNOSIS — G3184 Mild cognitive impairment, so stated: Secondary | ICD-10-CM | POA: Diagnosis not present

## 2018-04-06 DIAGNOSIS — F418 Other specified anxiety disorders: Secondary | ICD-10-CM | POA: Diagnosis not present

## 2018-04-06 DIAGNOSIS — I10 Essential (primary) hypertension: Secondary | ICD-10-CM | POA: Diagnosis not present

## 2018-04-06 DIAGNOSIS — E1142 Type 2 diabetes mellitus with diabetic polyneuropathy: Secondary | ICD-10-CM | POA: Diagnosis not present

## 2018-04-06 DIAGNOSIS — E1169 Type 2 diabetes mellitus with other specified complication: Secondary | ICD-10-CM | POA: Diagnosis not present

## 2018-04-06 DIAGNOSIS — E11319 Type 2 diabetes mellitus with unspecified diabetic retinopathy without macular edema: Secondary | ICD-10-CM | POA: Diagnosis not present

## 2018-04-07 ENCOUNTER — Emergency Department (HOSPITAL_COMMUNITY)
Admission: EM | Admit: 2018-04-07 | Discharge: 2018-04-07 | Disposition: A | Discharge status: Home / Self Care | Payer: Medicare Other | Attending: Emergency Medicine | Admitting: Emergency Medicine

## 2018-04-07 ENCOUNTER — Other Ambulatory Visit: Payer: Self-pay

## 2018-04-07 ENCOUNTER — Encounter (HOSPITAL_COMMUNITY): Payer: Self-pay | Admitting: *Deleted

## 2018-04-07 ENCOUNTER — Emergency Department (HOSPITAL_COMMUNITY): Payer: Medicare Other

## 2018-04-07 DIAGNOSIS — W19XXXA Unspecified fall, initial encounter: Secondary | ICD-10-CM | POA: Diagnosis not present

## 2018-04-07 DIAGNOSIS — S20212A Contusion of left front wall of thorax, initial encounter: Secondary | ICD-10-CM | POA: Insufficient documentation

## 2018-04-07 DIAGNOSIS — R0781 Pleurodynia: Secondary | ICD-10-CM

## 2018-04-07 DIAGNOSIS — S299XXA Unspecified injury of thorax, initial encounter: Secondary | ICD-10-CM | POA: Diagnosis not present

## 2018-04-07 DIAGNOSIS — Z794 Long term (current) use of insulin: Secondary | ICD-10-CM | POA: Diagnosis not present

## 2018-04-07 DIAGNOSIS — Z79899 Other long term (current) drug therapy: Secondary | ICD-10-CM | POA: Insufficient documentation

## 2018-04-07 DIAGNOSIS — Z7982 Long term (current) use of aspirin: Secondary | ICD-10-CM | POA: Diagnosis not present

## 2018-04-07 DIAGNOSIS — Y939 Activity, unspecified: Secondary | ICD-10-CM | POA: Insufficient documentation

## 2018-04-07 DIAGNOSIS — Y999 Unspecified external cause status: Secondary | ICD-10-CM | POA: Diagnosis not present

## 2018-04-07 DIAGNOSIS — Z743 Need for continuous supervision: Secondary | ICD-10-CM | POA: Diagnosis not present

## 2018-04-07 DIAGNOSIS — E119 Type 2 diabetes mellitus without complications: Secondary | ICD-10-CM | POA: Insufficient documentation

## 2018-04-07 DIAGNOSIS — Y929 Unspecified place or not applicable: Secondary | ICD-10-CM | POA: Diagnosis not present

## 2018-04-07 DIAGNOSIS — R52 Pain, unspecified: Secondary | ICD-10-CM | POA: Diagnosis not present

## 2018-04-07 DIAGNOSIS — I959 Hypotension, unspecified: Secondary | ICD-10-CM | POA: Diagnosis not present

## 2018-04-07 DIAGNOSIS — R279 Unspecified lack of coordination: Secondary | ICD-10-CM | POA: Diagnosis not present

## 2018-04-07 DIAGNOSIS — R0789 Other chest pain: Secondary | ICD-10-CM | POA: Diagnosis not present

## 2018-04-07 MED ORDER — MORPHINE SULFATE (PF) 4 MG/ML IV SOLN
4.0000 mg | Freq: Once | INTRAVENOUS | Status: AC
  Administered 2018-04-07: 4 mg via INTRAMUSCULAR
  Filled 2018-04-07: qty 1

## 2018-04-07 MED ORDER — HYDROCODONE-ACETAMINOPHEN 5-325 MG PO TABS
2.0000 | ORAL_TABLET | Freq: Once | ORAL | Status: AC
  Administered 2018-04-07: 2 via ORAL
  Filled 2018-04-07: qty 2

## 2018-04-07 MED ORDER — HYDROCODONE-ACETAMINOPHEN 5-325 MG PO TABS: 1.0000 | ORAL_TABLET | Freq: Four times a day (QID) | ORAL | 0 refills | Status: DC | PRN

## 2018-04-07 NOTE — ED Triage Notes (Signed)
Per pt and EMS Pt fell 2 days ago. Pt lives at [facility] at this Rehab . Pt has been taking  Tylenol for pain . Pt now has sever pain and screams with movements. Pt reports Lt rib pain.

## 2018-04-07 NOTE — ED Provider Notes (Signed)
[facility]ERGENCY DEPARTMENT Provider Note   CSN: [id number] Arrival date & time: 04/07/18  5366     History   Chief Complaint Chief Complaint  Patient presents with  . Rib Injury    HPI Hayden [name] is a 77 y.o. male.  HPI Reportedly, the patient fell 2 days ago.  Patient is very alert and very talkative.  However, the content of his speech is circuitous and repetitive.  He will explain to me that the situation is "very complicated".  He uses a wide range of adjectives and adverbs in small, partial phrases that are not conveying very many completed ideas.  He frequently exclaims out in pain.  He indicates that the pain is at the back of his right shoulder and thoracic chest wall.  Although patient is extremely conversant, he is not accomplishing an explanation as to how and what happened. Past Medical History:  Diagnosis Date  . Depression   . Diabetes mellitus   . Enlarged prostate   . Fibromyalgia   . GERD (gastroesophageal reflux disease)    reflux intermittent  . Hyperlipidemia   . Leukemia [facility]) chronic lymphocytic   2008 diagnosed-monitored Dr     Patient Active Problem List   Diagnosis Date Noted  . Cellulitis, scrotum 03/09/2018  . Cellulitis of male genitalia 03/08/2018  . Depression 03/08/2018  . BPH (benign prostatic hyperplasia) 03/08/2018  . Leukocytosis 03/08/2018  . Mild dementia ([place]) 10/23/2017  . UTI (urinary tract infection) 09/06/2017  . Hyperglycemia 09/06/2017  . ARF (acute renal failure) ([place]) 09/06/2017  . Altered mental status 09/06/2017  . Type 2 diabetes mellitus with complication ([place]) 44/08/4740  . 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 ADENOIDECTOMY    . CARPAL TUNNEL RELEASE     rt hand  . CATARACT EXTRACTION, BILATERAL    . CERVICAL FUSION  1998  . CYSTOSCOPY  07/26/2012   Procedure: CYSTOSCOPY;  Surgeon: Ailene Rud, MD;   Location: [facility];  Service: Urology;  Laterality: N/A;  DIAGNOSTIC CYSTO PROSTATE ULTRASOUND      . PROSTATE BIOPSY  07/26/2012   Procedure: PROSTATE BIOPSY;  Surgeon: Ailene Rud, MD;  Location: [facility];  Service: Urology;  Laterality: N/A;  . retinal micro aneurysms    . tonsil    . VASECTOMY    . VITRECTOMY          Home Medications    Prior to Admission medications   Medication Sig Start Date End Date Taking? Authorizing Provider  aspirin 81 MG tablet Take 81 mg by mouth at bedtime.    Yes [provider]  bacitracin ointment Apply topically 2 (two) times daily. 03/11/18  Yes [name], Omair Latif, DO  buPROPion (WELLBUTRIN XL) 300 MG 24 hr tablet Take 300 mg by mouth daily.   Yes [provider]  cetirizine (ZYRTEC) 10 MG tablet Take 10 mg by mouth daily as needed for allergies.    Yes [provider]  Cholecalciferol (VITAMIN D3) 1000 UNITS CAPS Take 1 capsule by mouth at bedtime.   Yes [provider]  docusate sodium (COLACE) 100 MG capsule Take 100 mg by mouth 2 (two) times daily.   Yes [provider]  EPINEPHrine (EPIPEN JR) 0.15 MG/0.3ML injection Inject 0.15 mg into the muscle as needed for anaphylaxis.   Yes [provider]  fish oil-omega-3 fatty acids 1000 MG capsule  Take 1 g by mouth 2 (two) times daily.    Yes [provider]  HYDROcodone-acetaminophen (NORCO/VICODIN) 5-325 MG tablet Take 1 tablet by mouth every 4 (four) hours as needed for moderate pain. 03/11/18  Yes [name], Omair Latif, DO  hydrOXYzine (ATARAX/VISTARIL) 25 MG tablet Take 25 mg by mouth 3 (three) times daily as needed for itching.   Yes [provider]  insulin lispro (HUMALOG KWIKPEN) 100 UNIT/ML KiwkPen Inject 3 Units into the skin 3 (three) times daily before meals.    Yes [provider]  LEVEMIR FLEXTOUCH 100 UNIT/ML Pen Inject 36 Units into the skin at bedtime.  07/16/15   Yes [provider]  lovastatin (MEVACOR) 20 MG tablet Take 20 mg by mouth at bedtime.   Yes [provider]  naproxen sodium (ALEVE) 220 MG tablet Take 220 mg by mouth every 12 (twelve) hours as needed (pain).   Yes [provider]  neomycin-bacitracin-polymyxin (NEOSPORIN) OINT Apply 1 application topically as needed for wound care (left shoulder).   Yes [provider]  QUEtiapine (SEROQUEL) 25 MG tablet Take 12.5 mg by mouth 2 (two) times daily.   Yes [provider]  tamsulosin (FLOMAX) 0.4 MG CAPS capsule Take 0.4 mg by mouth at bedtime.   Yes [provider]  traZODone (DESYREL) 100 MG tablet Take 150 mg by mouth at bedtime.   Yes [provider]  vitamin B-12 (CYANOCOBALAMIN) 1000 MCG tablet Take 1,000 mcg by mouth daily.   Yes [provider]  feeding supplement, GLUCERNA SHAKE, (GLUCERNA SHAKE) LIQD Take 237 mLs by mouth 3 (three) times daily between meals. 03/11/18   [name], DO  HYDROcodone-acetaminophen (NORCO/VICODIN) 5-325 MG tablet Take 1-2 tablets by mouth every 6 (six) hours as needed. 04/07/18   Charlesetta Shanks, MD  calcium citrate-vitamin D (CITRACAL+D) 315-200 MG-UNIT per tablet Take 1 tablet by mouth daily.  09/04/11  [provider]  terazosin (HYTRIN) 5 MG capsule Take 5 mg by mouth at bedtime.  09/04/11  [provider]    Family History Family History  Problem Relation Age of Onset  . Diabetes Father   . Diabetes Sister   . Diabetes Brother     Social History Social History   Tobacco Use  . Smoking status: Never Smoker  . Smokeless tobacco: Never Used  Substance Use Topics  . Alcohol use: No    Frequency: Never  . Drug use: No     Allergies   Bee venom; Glucophage [metformin]; Shellfish allergy; Grass extracts [gramineae pollens]; and Tobacco [nicotiana tabacum]   Review of Systems Review of Systems 10 Systems reviewed and are negative for acute change  except as noted in the HPI.   Physical Exam Updated Vital Signs BP 122/62   Pulse 82   Temp 97.9 F (36.6 C) (Oral)   Resp 19   SpO2 95%   Physical Exam  Constitutional:  Patient is very alert and interactive.  No respiratory distress.  Frequent vocal exclamations of pain.  Monitor vital signs are normal.  Oxygen saturation 100% on room air.  HENT:  Head: Normocephalic and atraumatic.  Mouth/Throat: Oropharynx is clear and moist.  Eyes: EOM are normal.  Cardiovascular: Normal rate, regular rhythm, normal heart sounds and intact distal pulses.  Pulmonary/Chest:  No respiratory distress.  Patient does appear to have bilateral breath sounds.  At this time, he will not sit forward in the stretcher for auscultation of his back pain constraints.  No palpable crepitus  or visible contusions or abrasions to the anterior and lateral chest walls.  Abdominal: Soft. He exhibits no distension. There is no tenderness. There is no guarding.  Musculoskeletal: Normal range of motion. He exhibits no edema or tenderness.  Neurological: He is alert. No cranial nerve deficit. He exhibits normal muscle tone. Coordination normal.  Skin: Skin is warm and dry.     ED Treatments / Results  Labs (all labs ordered are listed, but only abnormal results are displayed) Labs Reviewed - No data to display  EKG None  Radiology Dg Chest Port 1 View  Result Date: 04/07/2018 CLINICAL DATA:  Chest wall pain post fall, history diabetes mellitus, leukemia, GERD, fibromyalgia EXAM: PORTABLE CHEST 1 VIEW COMPARISON:  Portable exam 1029 hours without priors for comparison FINDINGS: Normal heart size, mediastinal contours, and pulmonary vascularity. Atherosclerotic calcification aorta. Lungs clear. No infiltrate, pleural effusion or pneumothorax. Bones demineralized. IMPRESSION: No acute abnormalities. Electronically Signed   By: Lavonia Dana M.D.   On: 04/07/2018 11:28    Procedures Procedures (including critical  care time)  Medications Ordered in ED Medications  morphine 4 MG/ML injection 4 mg (4 mg Intramuscular Given 04/07/18 1122)  HYDROcodone-acetaminophen (NORCO/VICODIN) 5-325 MG per tablet 2 tablet (2 tablets Oral Given 04/07/18 1241)     Initial Impression / Assessment and Plan / ED Course  I have reviewed the triage vital signs and the nursing notes.  Pertinent labs & imaging results that were available during my care of the patient were reviewed by me and considered in my medical decision making (see chart for details).    Patient was treated for pain, he is able to sit forward and allow full examination of his back and thorax.  Sounds are symmetric.  Patient does not have visible contusion or abrasion but has exquisitely reproducible pain to palpation over the lateral inferior thoracic chest wall.  X-ray does not show any acute findings.  She potentially has occult rib fracture but no signs of respiratory distress or hypoxia.  At this time, I do not feel the patient needs to proceed with CT scanning to further elucidate the pain source.  Term precautions are reviewed.  Patient will be treated for pain with close follow-up with PCP.  Final Clinical Impressions(s) / ED Diagnoses   Final diagnoses:  Rib pain on left side  Contusion of left chest wall, initial encounter    ED Discharge Orders         Ordered    HYDROcodone-acetaminophen (NORCO/VICODIN) 5-325 MG tablet  Every 6 hours PRN     04/07/18 1257           Charlesetta Shanks, MD 04/07/18 1302

## 2018-04-07 NOTE — ED Triage Notes (Signed)
Pt now able to ambulate to BR with out assistance

## 2018-04-07 NOTE — ED Triage Notes (Signed)
PT A/O with repetitive Phrases about narcotics and repetitive phrase about his fear of needle sticks. Pt alert to self only.

## 2018-04-08 DIAGNOSIS — E11319 Type 2 diabetes mellitus with unspecified diabetic retinopathy without macular edema: Secondary | ICD-10-CM | POA: Diagnosis not present

## 2018-04-08 DIAGNOSIS — E1169 Type 2 diabetes mellitus with other specified complication: Secondary | ICD-10-CM | POA: Diagnosis not present

## 2018-04-08 DIAGNOSIS — F418 Other specified anxiety disorders: Secondary | ICD-10-CM | POA: Diagnosis not present

## 2018-04-08 DIAGNOSIS — G3184 Mild cognitive impairment, so stated: Secondary | ICD-10-CM | POA: Diagnosis not present

## 2018-04-08 DIAGNOSIS — I10 Essential (primary) hypertension: Secondary | ICD-10-CM | POA: Diagnosis not present

## 2018-04-08 DIAGNOSIS — E1142 Type 2 diabetes mellitus with diabetic polyneuropathy: Secondary | ICD-10-CM | POA: Diagnosis not present

## 2018-04-09 DIAGNOSIS — I10 Essential (primary) hypertension: Secondary | ICD-10-CM | POA: Diagnosis not present

## 2018-04-09 DIAGNOSIS — E11319 Type 2 diabetes mellitus with unspecified diabetic retinopathy without macular edema: Secondary | ICD-10-CM | POA: Diagnosis not present

## 2018-04-09 DIAGNOSIS — E1142 Type 2 diabetes mellitus with diabetic polyneuropathy: Secondary | ICD-10-CM | POA: Diagnosis not present

## 2018-04-09 DIAGNOSIS — F418 Other specified anxiety disorders: Secondary | ICD-10-CM | POA: Diagnosis not present

## 2018-04-09 DIAGNOSIS — G3184 Mild cognitive impairment, so stated: Secondary | ICD-10-CM | POA: Diagnosis not present

## 2018-04-09 DIAGNOSIS — E1169 Type 2 diabetes mellitus with other specified complication: Secondary | ICD-10-CM | POA: Diagnosis not present

## 2018-04-12 DIAGNOSIS — F418 Other specified anxiety disorders: Secondary | ICD-10-CM | POA: Diagnosis not present

## 2018-04-12 DIAGNOSIS — E11319 Type 2 diabetes mellitus with unspecified diabetic retinopathy without macular edema: Secondary | ICD-10-CM | POA: Diagnosis not present

## 2018-04-12 DIAGNOSIS — Z7689 Persons encountering health services in other specified circumstances: Secondary | ICD-10-CM | POA: Diagnosis not present

## 2018-04-12 DIAGNOSIS — S20212A Contusion of left front wall of thorax, initial encounter: Secondary | ICD-10-CM | POA: Diagnosis not present

## 2018-04-14 DIAGNOSIS — E11319 Type 2 diabetes mellitus with unspecified diabetic retinopathy without macular edema: Secondary | ICD-10-CM | POA: Diagnosis not present

## 2018-04-14 DIAGNOSIS — I10 Essential (primary) hypertension: Secondary | ICD-10-CM | POA: Diagnosis not present

## 2018-04-14 DIAGNOSIS — E1169 Type 2 diabetes mellitus with other specified complication: Secondary | ICD-10-CM | POA: Diagnosis not present

## 2018-04-14 DIAGNOSIS — G3184 Mild cognitive impairment, so stated: Secondary | ICD-10-CM | POA: Diagnosis not present

## 2018-04-14 DIAGNOSIS — E1142 Type 2 diabetes mellitus with diabetic polyneuropathy: Secondary | ICD-10-CM | POA: Diagnosis not present

## 2018-04-14 DIAGNOSIS — F418 Other specified anxiety disorders: Secondary | ICD-10-CM | POA: Diagnosis not present

## 2018-04-16 DIAGNOSIS — E11319 Type 2 diabetes mellitus with unspecified diabetic retinopathy without macular edema: Secondary | ICD-10-CM | POA: Diagnosis not present

## 2018-04-16 DIAGNOSIS — I10 Essential (primary) hypertension: Secondary | ICD-10-CM | POA: Diagnosis not present

## 2018-04-16 DIAGNOSIS — F418 Other specified anxiety disorders: Secondary | ICD-10-CM | POA: Diagnosis not present

## 2018-04-16 DIAGNOSIS — E1142 Type 2 diabetes mellitus with diabetic polyneuropathy: Secondary | ICD-10-CM | POA: Diagnosis not present

## 2018-04-16 DIAGNOSIS — G3184 Mild cognitive impairment, so stated: Secondary | ICD-10-CM | POA: Diagnosis not present

## 2018-04-16 DIAGNOSIS — E1169 Type 2 diabetes mellitus with other specified complication: Secondary | ICD-10-CM | POA: Diagnosis not present

## 2018-04-17 ENCOUNTER — Encounter (HOSPITAL_COMMUNITY): Payer: Self-pay

## 2018-04-17 ENCOUNTER — Inpatient Hospital Stay (HOSPITAL_COMMUNITY)
Admission: EM | Admit: 2018-04-17 | Discharge: 2018-04-20 | DRG: 728 | Disposition: A | Discharge status: Nursing Home (Includes ICF) | Payer: Medicare Other | Attending: Family Medicine | Admitting: Family Medicine

## 2018-04-17 DIAGNOSIS — Z888 Allergy status to other drugs, medicaments and biological substances status: Secondary | ICD-10-CM

## 2018-04-17 DIAGNOSIS — K219 Gastro-esophageal reflux disease without esophagitis: Secondary | ICD-10-CM | POA: Diagnosis present

## 2018-04-17 DIAGNOSIS — B961 Klebsiella pneumoniae [K. pneumoniae] as the cause of diseases classified elsewhere: Secondary | ICD-10-CM | POA: Diagnosis present

## 2018-04-17 DIAGNOSIS — L0390 Cellulitis, unspecified: Secondary | ICD-10-CM | POA: Diagnosis not present

## 2018-04-17 DIAGNOSIS — N400 Enlarged prostate without lower urinary tract symptoms: Secondary | ICD-10-CM | POA: Diagnosis not present

## 2018-04-17 DIAGNOSIS — E119 Type 2 diabetes mellitus without complications: Secondary | ICD-10-CM | POA: Diagnosis present

## 2018-04-17 DIAGNOSIS — N492 Inflammatory disorders of scrotum: Principal | ICD-10-CM | POA: Diagnosis present

## 2018-04-17 DIAGNOSIS — F329 Major depressive disorder, single episode, unspecified: Secondary | ICD-10-CM | POA: Diagnosis not present

## 2018-04-17 DIAGNOSIS — F0390 Unspecified dementia without behavioral disturbance: Secondary | ICD-10-CM | POA: Diagnosis not present

## 2018-04-17 DIAGNOSIS — Z91013 Allergy to seafood: Secondary | ICD-10-CM

## 2018-04-17 DIAGNOSIS — Z833 Family history of diabetes mellitus: Secondary | ICD-10-CM

## 2018-04-17 DIAGNOSIS — N390 Urinary tract infection, site not specified: Secondary | ICD-10-CM | POA: Diagnosis not present

## 2018-04-17 DIAGNOSIS — Z7982 Long term (current) use of aspirin: Secondary | ICD-10-CM

## 2018-04-17 DIAGNOSIS — Z91048 Other nonmedicinal substance allergy status: Secondary | ICD-10-CM

## 2018-04-17 DIAGNOSIS — E118 Type 2 diabetes mellitus with unspecified complications: Secondary | ICD-10-CM

## 2018-04-17 DIAGNOSIS — R52 Pain, unspecified: Secondary | ICD-10-CM | POA: Diagnosis not present

## 2018-04-17 DIAGNOSIS — N499 Inflammatory disorder of unspecified male genital organ: Secondary | ICD-10-CM | POA: Diagnosis present

## 2018-04-17 DIAGNOSIS — E11649 Type 2 diabetes mellitus with hypoglycemia without coma: Secondary | ICD-10-CM | POA: Diagnosis not present

## 2018-04-17 DIAGNOSIS — F03A0 Unspecified dementia, mild, without behavioral disturbance, psychotic disturbance, mood disturbance, and anxiety: Secondary | ICD-10-CM | POA: Diagnosis present

## 2018-04-17 DIAGNOSIS — Z981 Arthrodesis status: Secondary | ICD-10-CM

## 2018-04-17 DIAGNOSIS — E1165 Type 2 diabetes mellitus with hyperglycemia: Secondary | ICD-10-CM | POA: Diagnosis present

## 2018-04-17 DIAGNOSIS — M797 Fibromyalgia: Secondary | ICD-10-CM | POA: Diagnosis present

## 2018-04-17 DIAGNOSIS — Z91030 Bee allergy status: Secondary | ICD-10-CM

## 2018-04-17 DIAGNOSIS — Z79899 Other long term (current) drug therapy: Secondary | ICD-10-CM

## 2018-04-17 DIAGNOSIS — Z794 Long term (current) use of insulin: Secondary | ICD-10-CM

## 2018-04-17 DIAGNOSIS — Z66 Do not resuscitate: Secondary | ICD-10-CM | POA: Diagnosis present

## 2018-04-17 DIAGNOSIS — C9110 Chronic lymphocytic leukemia of B-cell type not having achieved remission: Secondary | ICD-10-CM | POA: Diagnosis present

## 2018-04-17 DIAGNOSIS — E785 Hyperlipidemia, unspecified: Secondary | ICD-10-CM | POA: Diagnosis present

## 2018-04-17 DIAGNOSIS — N4822 Cellulitis of corpus cavernosum and penis: Secondary | ICD-10-CM | POA: Diagnosis not present

## 2018-04-17 LAB — URINALYSIS, ROUTINE W REFLEX MICROSCOPIC
Bilirubin Urine: NEGATIVE
Glucose, UA: >=500 mg/dL — AB
Hgb urine dipstick: NEGATIVE
Ketones, ur: 20 mg/dL — AB
NITRITE: NEGATIVE
Protein, ur: NEGATIVE mg/dL
Specific Gravity, Urine: 1.014 (ref 1.005–1.030)
pH: 7 (ref 5.0–8.0)

## 2018-04-17 LAB — CBC WITH DIFFERENTIAL/PLATELET
Abs Immature Granulocytes: 0.04 10*3/uL (ref 0.00–0.07)
BASOS PCT: 0 %
Basophils Absolute: 0 10*3/uL (ref 0.0–0.1)
EOS PCT: 1 %
Eosinophils Absolute: 0.1 10*3/uL (ref 0.0–0.5)
HEMATOCRIT: 39.7 % (ref 39.0–52.0)
Hemoglobin: 13.8 g/dL (ref 13.0–17.0)
IMMATURE GRANULOCYTES: 0 %
Lymphocytes Relative: 54 %
Lymphs Abs: 8.2 10*3/uL — ABNORMAL HIGH (ref 0.7–4.0)
MCH: 30.7 pg (ref 26.0–34.0)
MCHC: 34.8 g/dL (ref 30.0–36.0)
MCV: 88.4 fL (ref 80.0–100.0)
MONO ABS: 0.7 10*3/uL (ref 0.1–1.0)
MONOS PCT: 4 %
NEUTROS ABS: 6.3 10*3/uL (ref 1.7–7.7)
Neutrophils Relative %: 41 %
Platelets: 279 10*3/uL (ref 150–400)
RBC: 4.49 MIL/uL (ref 4.22–5.81)
RDW: 12.5 % (ref 11.5–15.5)
WBC: 15.3 10*3/uL — AB (ref 4.0–10.5)
nRBC: 0.2 % (ref 0.0–0.2)

## 2018-04-17 LAB — BASIC METABOLIC PANEL
Anion gap: 13 (ref 5–15)
BUN: 33 mg/dL — AB (ref 8–23)
CO2: 18 mmol/L — ABNORMAL LOW (ref 22–32)
CREATININE: 1.06 mg/dL (ref 0.61–1.24)
Calcium: 9.3 mg/dL (ref 8.9–10.3)
Chloride: 104 mmol/L (ref 98–111)
GFR calc Af Amer: >60 mL/min (ref >60)
GLUCOSE: 320 mg/dL — AB (ref 70–99)
POTASSIUM: 4.1 mmol/L (ref 3.5–5.1)
SODIUM: 135 mmol/L (ref 135–145)

## 2018-04-17 LAB — GLUCOSE, CAPILLARY
Glucose-Capillary: 231 mg/dL — ABNORMAL HIGH (ref 70–99)
Glucose-Capillary: 264 mg/dL — ABNORMAL HIGH (ref 70–99)

## 2018-04-17 LAB — CBG MONITORING, ED: GLUCOSE-CAPILLARY: 299 mg/dL — AB (ref 70–99)

## 2018-04-17 MED ORDER — INSULIN ASPART 100 UNIT/ML ~~LOC~~ SOLN
0.0000 [IU] | Freq: Three times a day (TID) | SUBCUTANEOUS | Status: DC
  Administered 2018-04-17: 5 [IU] via SUBCUTANEOUS
  Administered 2018-04-18: 8 [IU] via SUBCUTANEOUS
  Administered 2018-04-18 – 2018-04-19 (×2): 3 [IU] via SUBCUTANEOUS
  Administered 2018-04-19: 5 [IU] via SUBCUTANEOUS
  Administered 2018-04-19: 3 [IU] via SUBCUTANEOUS

## 2018-04-17 MED ORDER — INSULIN ASPART 100 UNIT/ML ~~LOC~~ SOLN
3.0000 [IU] | Freq: Three times a day (TID) | SUBCUTANEOUS | Status: DC
  Administered 2018-04-18 – 2018-04-20 (×7): 3 [IU] via SUBCUTANEOUS

## 2018-04-17 MED ORDER — VITAMIN D3 25 MCG (1000 UNIT) PO TABS
1000.0000 [IU] | ORAL_TABLET | Freq: Every day | ORAL | Status: DC
  Administered 2018-04-17 – 2018-04-19 (×3): 1000 [IU] via ORAL
  Filled 2018-04-17 (×4): qty 1

## 2018-04-17 MED ORDER — VITAMIN B-12 1000 MCG PO TABS
1000.0000 ug | ORAL_TABLET | Freq: Every day | ORAL | Status: DC
  Administered 2018-04-18 – 2018-04-20 (×3): 1000 ug via ORAL
  Filled 2018-04-17 (×3): qty 1

## 2018-04-17 MED ORDER — LORATADINE 10 MG PO TABS
10.0000 mg | ORAL_TABLET | Freq: Every day | ORAL | Status: DC
  Administered 2018-04-18 – 2018-04-20 (×3): 10 mg via ORAL
  Filled 2018-04-17 (×3): qty 1

## 2018-04-17 MED ORDER — ENOXAPARIN SODIUM 40 MG/0.4ML ~~LOC~~ SOLN
40.0000 mg | SUBCUTANEOUS | Status: DC
  Administered 2018-04-17 – 2018-04-19 (×3): 40 mg via SUBCUTANEOUS
  Filled 2018-04-17 (×3): qty 0.4

## 2018-04-17 MED ORDER — ONDANSETRON HCL 4 MG/2ML IJ SOLN: 4.0000 mg | Freq: Four times a day (QID) | INTRAMUSCULAR | Status: DC | PRN

## 2018-04-17 MED ORDER — ACETAMINOPHEN 650 MG RE SUPP: 650.0000 mg | Freq: Four times a day (QID) | RECTAL | Status: DC | PRN

## 2018-04-17 MED ORDER — BUPROPION HCL ER (XL) 300 MG PO TB24
300.0000 mg | ORAL_TABLET | Freq: Every day | ORAL | Status: DC
  Administered 2018-04-18 – 2018-04-20 (×3): 300 mg via ORAL
  Filled 2018-04-17 (×3): qty 1

## 2018-04-17 MED ORDER — TAMSULOSIN HCL 0.4 MG PO CAPS
0.4000 mg | ORAL_CAPSULE | Freq: Every day | ORAL | Status: DC
  Administered 2018-04-17 – 2018-04-19 (×3): 0.4 mg via ORAL
  Filled 2018-04-17 (×3): qty 1

## 2018-04-17 MED ORDER — OMEGA-3 FATTY ACIDS 1000 MG PO CAPS: 1.0000 g | ORAL_CAPSULE | Freq: Two times a day (BID) | ORAL | Status: DC

## 2018-04-17 MED ORDER — ONDANSETRON HCL 4 MG PO TABS: 4.0000 mg | ORAL_TABLET | Freq: Four times a day (QID) | ORAL | Status: DC | PRN

## 2018-04-17 MED ORDER — ACETAMINOPHEN 325 MG PO TABS: 650.0000 mg | ORAL_TABLET | Freq: Four times a day (QID) | ORAL | Status: DC | PRN

## 2018-04-17 MED ORDER — CLINDAMYCIN PHOSPHATE 600 MG/50ML IV SOLN: 600.0000 mg | Freq: Three times a day (TID) | INTRAVENOUS | Status: DC

## 2018-04-17 MED ORDER — HYDROCODONE-ACETAMINOPHEN 5-325 MG PO TABS
1.0000 | ORAL_TABLET | Freq: Four times a day (QID) | ORAL | Status: DC | PRN
  Administered 2018-04-18: 1 via ORAL
  Filled 2018-04-17: qty 1

## 2018-04-17 MED ORDER — CLINDAMYCIN PHOSPHATE 600 MG/50ML IV SOLN
600.0000 mg | Freq: Three times a day (TID) | INTRAVENOUS | Status: DC
  Filled 2018-04-17 (×2): qty 50

## 2018-04-17 MED ORDER — TRAZODONE HCL 50 MG PO TABS
150.0000 mg | ORAL_TABLET | Freq: Every day | ORAL | Status: DC
  Administered 2018-04-17 – 2018-04-19 (×3): 150 mg via ORAL
  Filled 2018-04-17 (×3): qty 1

## 2018-04-17 MED ORDER — QUETIAPINE FUMARATE 25 MG PO TABS
12.5000 mg | ORAL_TABLET | Freq: Two times a day (BID) | ORAL | Status: DC
  Administered 2018-04-17 – 2018-04-20 (×6): 12.5 mg via ORAL
  Filled 2018-04-17 (×6): qty 1

## 2018-04-17 MED ORDER — VITAMIN D3 25 MCG (1000 UT) PO CAPS: 1.0000 | ORAL_CAPSULE | Freq: Every day | ORAL | Status: DC

## 2018-04-17 MED ORDER — INSULIN LISPRO 100 UNIT/ML (KWIKPEN): 3.0000 [IU] | PEN_INJECTOR | Freq: Three times a day (TID) | SUBCUTANEOUS | Status: DC

## 2018-04-17 MED ORDER — ASPIRIN EC 81 MG PO TBEC
81.0000 mg | DELAYED_RELEASE_TABLET | Freq: Every day | ORAL | Status: DC
  Administered 2018-04-17 – 2018-04-19 (×3): 81 mg via ORAL
  Filled 2018-04-17 (×3): qty 1

## 2018-04-17 MED ORDER — DOCUSATE SODIUM 100 MG PO CAPS
100.0000 mg | ORAL_CAPSULE | Freq: Two times a day (BID) | ORAL | Status: DC
  Administered 2018-04-17 – 2018-04-20 (×6): 100 mg via ORAL
  Filled 2018-04-17 (×6): qty 1

## 2018-04-17 MED ORDER — PRAVASTATIN SODIUM 20 MG PO TABS
20.0000 mg | ORAL_TABLET | Freq: Every day | ORAL | Status: DC
  Administered 2018-04-17 – 2018-04-19 (×3): 20 mg via ORAL
  Filled 2018-04-17 (×3): qty 1

## 2018-04-17 MED ORDER — HYDROXYZINE HCL 25 MG PO TABS: 25.0000 mg | ORAL_TABLET | Freq: Three times a day (TID) | ORAL | Status: DC | PRN

## 2018-04-17 MED ORDER — INSULIN ASPART 100 UNIT/ML ~~LOC~~ SOLN
0.0000 [IU] | Freq: Every day | SUBCUTANEOUS | Status: DC
  Administered 2018-04-17: 3 [IU] via SUBCUTANEOUS
  Administered 2018-04-19: 2 [IU] via SUBCUTANEOUS

## 2018-04-17 MED ORDER — GLUCERNA SHAKE PO LIQD
237.0000 mL | Freq: Three times a day (TID) | ORAL | Status: DC
  Administered 2018-04-17 – 2018-04-19 (×5): 237 mL via ORAL
  Filled 2018-04-17 (×10): qty 237

## 2018-04-17 MED ORDER — SODIUM CHLORIDE 0.9 % IV BOLUS
500.0000 mL | Freq: Once | INTRAVENOUS | Status: DC
  Administered 2018-04-17: 500 mL via INTRAVENOUS

## 2018-04-17 MED ORDER — INSULIN DETEMIR 100 UNIT/ML FLEXPEN: 36.0000 [IU] | PEN_INJECTOR | Freq: Every day | SUBCUTANEOUS | Status: DC

## 2018-04-17 MED ORDER — CLINDAMYCIN PHOSPHATE 600 MG/50ML IV SOLN
600.0000 mg | Freq: Three times a day (TID) | INTRAVENOUS | Status: DC
  Administered 2018-04-17 – 2018-04-20 (×8): 600 mg via INTRAVENOUS
  Filled 2018-04-17 (×9): qty 50

## 2018-04-17 MED ORDER — OMEGA-3-ACID ETHYL ESTERS 1 G PO CAPS
1.0000 g | ORAL_CAPSULE | Freq: Two times a day (BID) | ORAL | Status: DC
  Administered 2018-04-17 – 2018-04-20 (×6): 1 g via ORAL
  Filled 2018-04-17 (×6): qty 1

## 2018-04-17 MED ORDER — FENTANYL CITRATE (PF) 100 MCG/2ML IJ SOLN
50.0000 ug | Freq: Once | INTRAMUSCULAR | Status: AC
  Administered 2018-04-17: 50 ug via INTRAVENOUS
  Filled 2018-04-17: qty 2

## 2018-04-17 MED ORDER — ONDANSETRON HCL 4 MG/2ML IJ SOLN
4.0000 mg | Freq: Once | INTRAMUSCULAR | Status: AC
  Administered 2018-04-17: 4 mg via INTRAVENOUS
  Filled 2018-04-17: qty 2

## 2018-04-17 MED ORDER — INSULIN DETEMIR 100 UNIT/ML ~~LOC~~ SOLN
36.0000 [IU] | Freq: Every day | SUBCUTANEOUS | Status: DC
  Administered 2018-04-17: 36 [IU] via SUBCUTANEOUS
  Filled 2018-04-17: qty 0.36

## 2018-04-17 NOTE — ED Notes (Signed)
ED TO INPATIENT HANDOFF REPORT  Name/Age/Gender Hayden [name] 77 y.o. male  Code Status Code Status History    Date Active Date Inactive Code Status Order ID Comments User Context   03/08/2018 1941 03/11/2018 2105 Full Code [id number]  Norval Morton, MD ED   09/06/2017 2211 09/10/2017 2151 Full Code [id number]  Jani Gravel, MD ED      Home/SNF/Other [facility]  Chief Complaint groin pain  Level of Care/Admitting Diagnosis ED Disposition    ED Disposition Condition [place]: [facility] [469629]  Level of Care: Med-Surg [16]  Diagnosis: Cellulitis [528413]  Admitting Physician: [name] [[id number]]  Attending Physician: [name] [id number]  PT Class (Do Not Modify): Observation [104]  PT Acc Code (Do Not Modify): Observation [10022]       Medical History Past Medical History:  Diagnosis Date  . Depression   . Diabetes mellitus   . Enlarged prostate   . Fibromyalgia   . GERD (gastroesophageal reflux disease)    reflux intermittent  . Hyperlipidemia   . Leukemia ([place]) chronic lymphocytic   2008 diagnosed-monitored Dr     Allergies Allergies  Allergen Reactions  . Bee Venom Other (See Comments)    Bump where stung and took days to go away  . Glucophage [Metformin] Diarrhea    "i will never take it again -stool incontinent "  . Shellfish Allergy Swelling  . Grass Extracts [Gramineae Pollens]   . Tobacco [Nicotiana Tabacum]     IV Location/Drains/Wounds Patient Lines/Drains/Airways Status   Active Line/Drains/Airways    Name:   Placement date:   Placement time:   Site:   Days:   Peripheral IV 04/17/18 Left Forearm   04/17/18    1610    Forearm   less than 1   Wound / Incision (Open or Dehisced) 03/08/18 Other (Comment) Penis Left;Lateral small red sore    03/08/18    2100    Penis   40          Labs/Imaging Results for orders placed or performed during the hospital encounter of 04/17/18 (from the past 48  hour(s))  Basic metabolic panel     Status: Abnormal   Collection Time: 04/17/18  4:09 PM  Result Value Ref Range   Sodium 135 135 - 145 mmol/L   Potassium 4.1 3.5 - 5.1 mmol/L   Chloride 104 98 - 111 mmol/L   CO2 18 (L) 22 - 32 mmol/L   Glucose, Bld 320 (H) 70 - 99 mg/dL   BUN 33 (H) 8 - 23 mg/dL   Creatinine, Ser 1.06 0.61 - 1.24 mg/dL   Calcium 9.3 8.9 - 10.3 mg/dL   GFR calc non Af Amer >60 >60 mL/min   GFR calc Af Amer >60 >60 mL/min    Comment: (NOTE) The eGFR has been calculated using the CKD EPI equation. This calculation has not been validated in all clinical situations. eGFR's persistently <60 mL/min signify possible Chronic Kidney Disease.    Anion gap 13 5 - 15    Comment: Performed at [facility], [place] [address] [place], [place] [address]  CBC with Differential     Status: Abnormal   Collection Time: 04/17/18  4:09 PM  Result Value Ref Range   WBC 15.3 (H) 4.0 - 10.5 K/uL   RBC 4.49 4.22 - 5.81 MIL/uL   Hemoglobin 13.8 13.0 - 17.0 g/dL   HCT 39.7 39.0 - 52.0 %  MCV 88.4 80.0 - 100.0 fL   MCH 30.7 26.0 - 34.0 pg   MCHC 34.8 30.0 - 36.0 g/dL   RDW 12.5 11.5 - 15.5 %   Platelets 279 150 - 400 K/uL   nRBC 0.2 0.0 - 0.2 %   Neutrophils Relative % 41 %   Neutro Abs 6.3 1.7 - 7.7 K/uL   Lymphocytes Relative 54 %   Lymphs Abs 8.2 (H) 0.7 - 4.0 K/uL   Monocytes Relative 4 %   Monocytes Absolute 0.7 0.1 - 1.0 K/uL   Eosinophils Relative 1 %   Eosinophils Absolute 0.1 0.0 - 0.5 K/uL   Basophils Relative 0 %   Basophils Absolute 0.0 0.0 - 0.1 K/uL   RBC Morphology CRENATED RBCs    Smear Review SMUDGE CELLS    Immature Granulocytes 0 %   Abs Immature Granulocytes 0.04 0.00 - 0.07 K/uL    Comment: Performed at [facility], [place] [address], [place], [place] [address]  Urinalysis, Routine w reflex microscopic     Status: Abnormal   Collection Time: 04/17/18  5:06 PM  Result Value Ref Range   Color, Urine YELLOW YELLOW    APPearance CLOUDY (A) CLEAR   Specific Gravity, Urine 1.014 1.005 - 1.030   pH 7.0 5.0 - 8.0   Glucose, UA >=500 (A) NEGATIVE mg/dL   Hgb urine dipstick NEGATIVE NEGATIVE   Bilirubin Urine NEGATIVE NEGATIVE   Ketones, ur 20 (A) NEGATIVE mg/dL   Protein, ur NEGATIVE NEGATIVE mg/dL   Nitrite NEGATIVE NEGATIVE   Leukocytes, UA LARGE (A) NEGATIVE   RBC / HPF 0-5 0 - 5 RBC/hpf   WBC, UA >50 (H) 0 - 5 WBC/hpf   Bacteria, UA MANY (A) NONE SEEN    Comment: Performed at [facility], [place] [address], [place], [place] [address]  CBG monitoring, ED     Status: Abnormal   Collection Time: 04/17/18  5:13 PM  Result Value Ref Range   Glucose-Capillary 299 (H) 70 - 99 mg/dL   No results found. None  Pending Labs Unresulted Labs (From admission, onward)    Start     Ordered   04/17/18 1730  Culture, blood (routine x 2)  BLOOD CULTURE X 2,   R    Comments:  for severe disease only    04/17/18 1729          Vitals/Pain Today's Vitals   04/17/18 1405 04/17/18 1413 04/17/18 1654 04/17/18 1659  BP: (!) 133/59  135/81   Pulse: 91  98   Resp:   (!) 21   Temp: 98.2 F (36.8 C)     TempSrc: Oral     SpO2: 100%  98%   Weight: 61.2 kg     Height: '5\' 8"'  (1.727 m)     PainSc:  0-No pain  10-Worst pain ever    Isolation Precautions No active isolations  Medications Medications  clindamycin (CLEOCIN) IVPB 600 mg (has no administration in time range)  sodium chloride 0.9 % bolus 500 mL (has no administration in time range)  fentaNYL (SUBLIMAZE) injection 50 mcg (50 mcg Intravenous Given 04/17/18 1701)  ondansetron (ZOFRAN) injection 4 mg (4 mg Intravenous Given 04/17/18 1704)    Mobility walks

## 2018-04-17 NOTE — ED Triage Notes (Signed)
Patient presented to ed with c/o groin pain. Pt state groin pain being going on for 2 months. Patient coming from [place].

## 2018-04-17 NOTE — H&P (Signed)
History and Physical    MAYFIELD SCHOENE NLG:[id number] DOB: June 22, 1941 DOA: 04/17/2018  PCP: Juanell Fairly, MD (Inactive)  Patient coming from: [facility]  Chief Complaint: Scrotal infection and pain  HPI: Hayden [name] is a 77 y.o. male with medical history significant of cellulitis of the [place] area, CLL, dementia, type 2 diabetes, BPH.  Patient presented secondary to worsening erythema and pain of his penis and scrotal area over the last few weeks.  Patient reports that after his last admission, his cellulitis improved and resolved.  He reports receiving some analgesics at the nursing facility which helped his pain a little bit.  Symptoms are similar to previous admission.  ED Course: Vitals: Afebrile, normal pulse, normal respirations, normotensive, on room air. Labs: CO2 of 18, glucose of 320, BUN of 33, creatinine of 1.06, WBC of 15.3 Imaging: None Medications/Course: Clindamyciin, fentanyl, Zofran, 500 mL NS bolus  Review of Systems: Review of Systems  Constitutional: Negative for chills and fever.  Respiratory: Negative for shortness of breath and wheezing.   Gastrointestinal: Negative for constipation, diarrhea, nausea and vomiting.  Genitourinary: Negative for dysuria.  All other systems reviewed and are negative.   Past Medical History:  Diagnosis Date  . Depression   . Diabetes mellitus   . Enlarged prostate   . Fibromyalgia   . GERD (gastroesophageal reflux disease)    reflux intermittent  . Hyperlipidemia   . Leukemia ([place]) chronic lymphocytic   2008 diagnosed-monitored Dr     Past Surgical History:  Procedure Laterality Date  . ADENOIDECTOMY    . CARPAL TUNNEL RELEASE     rt hand  . CATARACT EXTRACTION, BILATERAL    . CERVICAL FUSION  1998  . CYSTOSCOPY  07/26/2012   Procedure: CYSTOSCOPY;  Surgeon: Ailene Rud, MD;  Location: [facility];  Service: Urology;  Laterality: N/A;  DIAGNOSTIC CYSTO PROSTATE ULTRASOUND      .  PROSTATE BIOPSY  07/26/2012   Procedure: PROSTATE BIOPSY;  Surgeon: Ailene Rud, MD;  Location: [facility];  Service: Urology;  Laterality: N/A;  . retinal micro aneurysms    . tonsil    . VASECTOMY    . VITRECTOMY       reports that he has never smoked. He has never used smokeless tobacco. He reports that he does not drink alcohol or use drugs.  Allergies  Allergen Reactions  . Bee Venom Other (See Comments)    Bump where stung and took days to go away  . Glucophage [Metformin] Diarrhea    "i will never take it again -stool incontinent "  . Shellfish Allergy Swelling  . Grass Extracts [Gramineae Pollens]   . Tobacco [Nicotiana Tabacum]     Family History  Problem Relation Age of Onset  . Diabetes Father   . Diabetes Sister   . Diabetes Brother     Prior to Admission medications   Medication Sig Start Date End Date Taking? Authorizing Provider  aspirin 81 MG tablet Take 81 mg by mouth at bedtime.    Yes [provider]  bacitracin ointment Apply topically 2 (two) times daily. 03/11/18  Yes [name], Omair Latif, DO  buPROPion (WELLBUTRIN XL) 300 MG 24 hr tablet Take 300 mg by mouth daily.   Yes [provider]  cetirizine (ZYRTEC) 10 MG tablet Take 10 mg by mouth daily as needed for allergies.    Yes [provider]  Cholecalciferol (VITAMIN D3) 1000 UNITS CAPS Take 1  capsule by mouth at bedtime.   Yes [provider]  DIMETHICONE-ZINC OXIDE EX Apply 1 application topically 2 (two) times daily.   Yes [provider]  docusate sodium (COLACE) 100 MG capsule Take 100 mg by mouth 2 (two) times daily.   Yes [provider]  EPINEPHrine (EPIPEN JR) 0.15 MG/0.3ML injection Inject 0.15 mg into the muscle as needed for anaphylaxis.   Yes [provider]  feeding supplement, GLUCERNA SHAKE, (GLUCERNA SHAKE) LIQD Take 237 mLs by mouth 3 (three) times daily between meals. 03/11/18  Yes [name], Omair Latif,  DO  fish oil-omega-3 fatty acids 1000 MG capsule Take 1 g by mouth 2 (two) times daily.    Yes [provider]  HYDROcodone-acetaminophen (NORCO/VICODIN) 5-325 MG tablet Take 1-2 tablets by mouth every 6 (six) hours as needed. 04/07/18  Yes Charlesetta Shanks, MD  hydrOXYzine (ATARAX/VISTARIL) 25 MG tablet Take 25 mg by mouth 3 (three) times daily as needed for itching.   Yes [provider]  insulin lispro (HUMALOG KWIKPEN) 100 UNIT/ML KiwkPen Inject 3 Units into the skin 3 (three) times daily before meals.    Yes [provider]  LEVEMIR FLEXTOUCH 100 UNIT/ML Pen Inject 36 Units into the skin at bedtime.  07/16/15  Yes [provider]  lovastatin (MEVACOR) 20 MG tablet Take 20 mg by mouth at bedtime.   Yes [provider]  naproxen sodium (ALEVE) 220 MG tablet Take 220 mg by mouth every 12 (twelve) hours as needed (pain).   Yes [provider]  neomycin-bacitracin-polymyxin (NEOSPORIN) OINT Apply 1 application topically as needed for wound care (left shoulder).   Yes [provider]  QUEtiapine (SEROQUEL) 25 MG tablet Take 12.5 mg by mouth 2 (two) times daily.   Yes [provider]  tamsulosin (FLOMAX) 0.4 MG CAPS capsule Take 0.4 mg by mouth at bedtime.   Yes [provider]  traZODone (DESYREL) 100 MG tablet Take 150 mg by mouth at bedtime.   Yes [provider]  vitamin B-12 (CYANOCOBALAMIN) 1000 MCG tablet Take 1,000 mcg by mouth daily.   Yes [provider]  HYDROcodone-acetaminophen (NORCO/VICODIN) 5-325 MG tablet Take 1 tablet by mouth every 4 (four) hours as needed for moderate pain. Patient not taking: Reported on 04/17/2018 03/11/18   [name], DO  calcium citrate-vitamin D (CITRACAL+D) 315-200 MG-UNIT per tablet Take 1 tablet by mouth daily.  09/04/11  [provider]  terazosin (HYTRIN) 5 MG capsule Take 5 mg by mouth at bedtime.  09/04/11  [provider]    Physical  Exam:  Physical Exam  Constitutional: He is oriented to person, place, and time. He appears well-developed and well-nourished. No distress.  HENT:  Mouth/Throat: Oropharynx is clear and moist.  Eyes: Pupils are equal, round, and reactive to light. Conjunctivae and EOM are normal.  Neck: Normal range of motion.  Cardiovascular: Normal rate, regular rhythm and normal heart sounds.  No murmur heard. Pulmonary/Chest: Effort normal and breath sounds normal. No respiratory distress. He has no wheezes. He has no rales.  Abdominal: Soft. Bowel sounds are normal. He exhibits no distension. There is no tenderness. There is no rebound and no guarding.  Musculoskeletal: Normal range of motion. He exhibits no edema or tenderness.  Lymphadenopathy:    He has no cervical adenopathy.  Neurological: He is alert and oriented to person, place, and time.  Skin: Skin is warm and dry. He is not diaphoretic. There is erythema (Scrotum and penis. Area  is tender on palpation.).  Psychiatric: He has a normal mood and affect. His speech is tangential.       Labs on Admission: I have personally reviewed following labs and imaging studies  CBC: Recent Labs  Lab 04/17/18 1609  WBC 15.3*  NEUTROABS 6.3  HGB 13.8  HCT 39.7  MCV 88.4  PLT 485    Basic Metabolic Panel: Recent Labs  Lab 04/17/18 1609  NA 135  K 4.1  CL 104  CO2 18*  GLUCOSE 320*  BUN 33*  CREATININE 1.06  CALCIUM 9.3    GFR: Estimated Creatinine Clearance: 49.7 mL/min (by C-G formula based on SCr of 1.06 mg/dL).  CBG: Recent Labs  Lab 04/17/18 1713  GLUCAP 299*    Urine analysis:    Component Value Date/Time   COLORURINE YELLOW 03/08/2018 1942   APPEARANCEUR HAZY (A) 03/08/2018 1942   LABSPEC 1.014 03/08/2018 1942   PHURINE 6.0 03/08/2018 1942   GLUCOSEU NEGATIVE 03/08/2018 1942   HGBUR SMALL (A) 03/08/2018 1942   BILIRUBINUR NEGATIVE 03/08/2018 1942   KETONESUR NEGATIVE 03/08/2018 1942   PROTEINUR NEGATIVE  03/08/2018 1942   NITRITE POSITIVE (A) 03/08/2018 1942   LEUKOCYTESUR LARGE (A) 03/08/2018 1942     Radiological Exams on Admission: No results found.   Assessment/Plan Active Problems:   CLL (chronic lymphocytic leukemia) (HCC)   Type 2 diabetes mellitus with complication (HCC)   Mild dementia (HCC)   Cellulitis of male genitalia   Cellulitis    Cellulitis Penis and scrotum. No purulent drainage.  Recurrent.  Associated mild leukocytosis. -Continue clindamycin -Blood cultures pending  Abnormal urinalysis Patient is asymptomatic. -Urine culture, although this will be after patient obtained 1 dose of clindamycin -Norco as needed for pain  Type 2 diabetes mellitus Patient is insulin-dependent.  Currently on Levemir 36 units in addition to Humalog 3 units with meals.  Currently hyperglycemic with a CO2 of 18.  Anion gap is normal.  Patient received 500 mL of IV fluids in the ED. -Continue home regimen of Levemir 36 units in addition to NovoLog 3 units with meals -Sliding scale insulin moderate scale 3 times before meals and at bedtime -Repeat BMP to ensure patient is not developing DKA  Dementia Mild Per chart review records. -Continue Wellbutrin, Seroquel -Continue trazodone nightly  BPH Patient follows with [facility] as an outpatient. -Continue Flomax  CLL Asymptomatic.  Patient follows with oncology twice per year.   DVT prophylaxis: Lovenox Code Status: DNR Family Communication: None at bedside Disposition Plan: Discharge likely in 24 hours if cellulitis improved Consults called: None Admission status: Observation   Cordelia Poche, MD [facility] 04/17/2018, 5:47 PM  If 7PM-7AM, please contact night-coverage [email or web address] Password [email or web address]

## 2018-04-17 NOTE — ED Notes (Signed)
Bed: IR44 Expected date:  Expected time:  Means of arrival:  Comments: 77y from facility, Groin pain for 2 months, CBG 440,  Triage upon arrival

## 2018-04-17 NOTE — ED Provider Notes (Signed)
[facility] DEPT Provider Note   CSN: [id number] Arrival date & time: 04/17/18  1352     History   Chief Complaint No chief complaint on file.   HPI Hayden [name] is a 77 y.o. male.  77yo male with history of insulin dependent diabetes, recently admitted for cellulitis of the genitals in early September, presents with complaint of groin pain. Patient is very alert, very talkative, mentions pain in his groin and scrotum (points to pubic symphysis as to location of his pain) for at least 3-4 weeks. Denies difficulty urinating. States he fell earlier this month and injured his left lower ribs/chest. States the facility he lives at if very good with managing his pain medications. No other complaints or concerns.      Past Medical History:  Diagnosis Date  . Depression   . Diabetes mellitus   . Enlarged prostate   . Fibromyalgia   . GERD (gastroesophageal reflux disease)    reflux intermittent  . Hyperlipidemia   . Leukemia [facility]) chronic lymphocytic   2008 diagnosed-monitored Dr     Patient Active Problem List   Diagnosis Date Noted  . Cellulitis 04/17/2018  . Cellulitis, scrotum 03/09/2018  . Cellulitis of male genitalia 03/08/2018  . Depression 03/08/2018  . BPH (benign prostatic hyperplasia) 03/08/2018  . Leukocytosis 03/08/2018  . Mild dementia ([place]) 10/23/2017  . UTI (urinary tract infection) 09/06/2017  . Hyperglycemia 09/06/2017  . ARF (acute renal failure) ([place]) 09/06/2017  . Altered mental status 09/06/2017  . Type 2 diabetes mellitus with complication ([place]) 14/48/1856  . 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 ADENOIDECTOMY    . CARPAL TUNNEL RELEASE     rt hand  . CATARACT EXTRACTION, BILATERAL    . CERVICAL FUSION  1998  . CYSTOSCOPY  07/26/2012   Procedure: CYSTOSCOPY;  Surgeon: Ailene Rud, MD;  Location: [facility];  Service:  Urology;  Laterality: N/A;  DIAGNOSTIC CYSTO PROSTATE ULTRASOUND      . PROSTATE BIOPSY  07/26/2012   Procedure: PROSTATE BIOPSY;  Surgeon: Ailene Rud, MD;  [facility];  Service: Urology;  Laterality: N/A;  . retinal micro aneurysms    . tonsil    . VASECTOMY    . VITRECTOMY          Home Medications    Prior to Admission medications   Medication Sig Start Date End Date Taking? Authorizing Provider  aspirin 81 MG tablet Take 81 mg by mouth at bedtime.    Yes [provider]  bacitracin ointment Apply topically 2 (two) times daily. 03/11/18  Yes [name], Omair Latif, DO  buPROPion (WELLBUTRIN XL) 300 MG 24 hr tablet Take 300 mg by mouth daily.   Yes [provider]  cetirizine (ZYRTEC) 10 MG tablet Take 10 mg by mouth daily as needed for allergies.    Yes [provider]  Cholecalciferol (VITAMIN D3) 1000 UNITS CAPS Take 1 capsule by mouth at bedtime.   Yes [provider]  DIMETHICONE-ZINC OXIDE EX Apply 1 application topically 2 (two) times daily.   Yes [provider]  docusate sodium (COLACE) 100 MG capsule Take 100 mg by mouth 2 (two) times daily.   Yes [provider]  EPINEPHrine (EPIPEN JR) 0.15 MG/0.3ML injection Inject 0.15 mg into the muscle as needed for anaphylaxis.   Yes [provider]  feeding supplement, [place], ([place]  SHAKE) LIQD Take 237 mLs by mouth 3 (three) times daily between meals. 03/11/18  Yes [name], Omair Latif, DO  fish oil-omega-3 fatty acids 1000 MG capsule Take 1 g by mouth 2 (two) times daily.    Yes [provider]  HYDROcodone-acetaminophen (NORCO/VICODIN) 5-325 MG tablet Take 1-2 tablets by mouth every 6 (six) hours as needed. 04/07/18  Yes Charlesetta Shanks, MD  hydrOXYzine (ATARAX/VISTARIL) 25 MG tablet Take 25 mg by mouth 3 (three) times daily as needed for itching.   Yes [provider]  insulin lispro (HUMALOG KWIKPEN) 100  UNIT/ML KiwkPen Inject 3 Units into the skin 3 (three) times daily before meals.    Yes [provider]  LEVEMIR FLEXTOUCH 100 UNIT/ML Pen Inject 36 Units into the skin at bedtime.  07/16/15  Yes [provider]  lovastatin (MEVACOR) 20 MG tablet Take 20 mg by mouth at bedtime.   Yes [provider]  naproxen sodium (ALEVE) 220 MG tablet Take 220 mg by mouth every 12 (twelve) hours as needed (pain).   Yes [provider]  neomycin-bacitracin-polymyxin (NEOSPORIN) OINT Apply 1 application topically as needed for wound care (left shoulder).   Yes [provider]  QUEtiapine (SEROQUEL) 25 MG tablet Take 12.5 mg by mouth 2 (two) times daily.   Yes [provider]  tamsulosin (FLOMAX) 0.4 MG CAPS capsule Take 0.4 mg by mouth at bedtime.   Yes [provider]  traZODone (DESYREL) 100 MG tablet Take 150 mg by mouth at bedtime.   Yes [provider]  vitamin B-12 (CYANOCOBALAMIN) 1000 MCG tablet Take 1,000 mcg by mouth daily.   Yes [provider]  HYDROcodone-acetaminophen (NORCO/VICODIN) 5-325 MG tablet Take 1 tablet by mouth every 4 (four) hours as needed for moderate pain. Patient not taking: Reported on 04/17/2018 03/11/18   [name], DO  calcium citrate-vitamin D (CITRACAL+D) 315-200 MG-UNIT per tablet Take 1 tablet by mouth daily.  09/04/11  [provider]  terazosin (HYTRIN) 5 MG capsule Take 5 mg by mouth at bedtime.  09/04/11  [provider]    Family History Family History  Problem Relation Age of Onset  . Diabetes Father   . Diabetes Sister   . Diabetes Brother     Social History Social History   Tobacco Use  . Smoking status: Never Smoker  . Smokeless tobacco: Never Used  Substance Use Topics  . Alcohol use: No    Frequency: Never  . Drug use: No     Allergies   Bee venom; Glucophage [metformin]; Shellfish allergy; Grass extracts [gramineae pollens]; and Tobacco  [nicotiana tabacum]   Review of Systems Review of Systems  Constitutional: Negative for chills and fever.  Gastrointestinal: Negative for abdominal pain, constipation, diarrhea, nausea and vomiting.  Genitourinary: Negative for difficulty urinating, dysuria and frequency.  Musculoskeletal: Negative for arthralgias, gait problem and myalgias.  Skin: Positive for color change.  Allergic/Immunologic: Positive for immunocompromised state.  Neurological: Negative for weakness.  Hematological: Negative for adenopathy.  Psychiatric/Behavioral: Negative for confusion.  All other systems reviewed and are negative.    Physical Exam Updated Vital Signs BP 135/81 (BP Location: Right Arm)   Pulse 98   Temp 98.2 F (36.8 C) (Oral)   Resp (!) 21   Ht 5\' 8"  (1.727 m)   Wt 61.2 kg   SpO2 98%   BMI 20.53 kg/m   Physical Exam  Constitutional: He is oriented to person, place, and time. He appears well-developed and well-nourished.  No distress.  HENT:  Head: Normocephalic and atraumatic.  Cardiovascular: Normal rate, regular rhythm, normal heart sounds and intact distal pulses.  No murmur heard. Pulmonary/Chest: Effort normal and breath sounds normal. No respiratory distress.  Abdominal: Soft. He exhibits no distension. There is no tenderness.  Genitourinary: Circumcised.     Neurological: He is alert and oriented to person, place, and time. No sensory deficit.  Skin: He is not diaphoretic. There is erythema.  Psychiatric: He has a normal mood and affect. His behavior is normal.  Nursing note and vitals reviewed.    ED Treatments / Results  Labs (all labs ordered are listed, but only abnormal results are displayed) Labs Reviewed  BASIC METABOLIC PANEL - Abnormal; Notable for the following components:      Result Value   CO2 18 (*)    Glucose, Bld 320 (*)    BUN 33 (*)    All other components within normal limits  CBC WITH DIFFERENTIAL/PLATELET - Abnormal; Notable for the  following components:   WBC 15.3 (*)    Lymphs Abs 8.2 (*)    All other components within normal limits  CBG MONITORING, ED - Abnormal; Notable for the following components:   Glucose-Capillary 299 (*)    All other components within normal limits  CULTURE, BLOOD (ROUTINE X 2)  CULTURE, BLOOD (ROUTINE X 2)  URINALYSIS, ROUTINE W REFLEX MICROSCOPIC    EKG None  Radiology No results found.  Procedures Procedures (including critical care time)  Medications Ordered in ED Medications  clindamycin (CLEOCIN) IVPB 600 mg (has no administration in time range)  sodium chloride 0.9 % bolus 500 mL (has no administration in time range)  fentaNYL (SUBLIMAZE) injection 50 mcg (50 mcg Intravenous Given 04/17/18 1701)  ondansetron (ZOFRAN) injection 4 mg (4 mg Intravenous Given 04/17/18 1704)     Initial Impression / Assessment and Plan / ED Course  I have reviewed the triage vital signs and the nursing notes.  Pertinent labs & imaging results that were available during my care of the patient were reviewed by me and considered in my medical decision making (see chart for details).  Clinical Course as of Apr 18 1747  Sat Apr 17, 6160  782 77 year old diabetic male with complaint of pain and swelling in his penis and scrotum.  Patient was admitted for cellulitis to same area 1 month ago.  Denies dysuria.  On initial exam, significant pain limiting exam which shows swelling and erythema of the penis and scrotum.  Repeat exam with ED physician, Dr. Sherry Ruffing, pain is better controlled at this time, erythema does not extend to the posterior scrotum, not consistent with Fournier's gangrene.  Hospitalist paged for consult, patient given IV fluids and clindamycin.   [LM]  1748 Case discussed with Dr. Lonny Prude, hospitalist, who will see the patient.    [LM]    Clinical Course User Index [LM] Tacy Learn, PA-C   Final Clinical Impressions(s) / ED Diagnoses   Final diagnoses:  Cellulitis,  unspecified cellulitis site    ED Discharge Orders    None       Roque Lias 04/17/18 1748    Jola Schmidt, MD 04/18/18 [phone]

## 2018-04-18 ENCOUNTER — Encounter (HOSPITAL_COMMUNITY): Payer: Self-pay

## 2018-04-18 ENCOUNTER — Other Ambulatory Visit: Payer: Self-pay

## 2018-04-18 DIAGNOSIS — K219 Gastro-esophageal reflux disease without esophagitis: Secondary | ICD-10-CM | POA: Diagnosis not present

## 2018-04-18 DIAGNOSIS — Z888 Allergy status to other drugs, medicaments and biological substances status: Secondary | ICD-10-CM | POA: Diagnosis not present

## 2018-04-18 DIAGNOSIS — E11649 Type 2 diabetes mellitus with hypoglycemia without coma: Secondary | ICD-10-CM | POA: Diagnosis not present

## 2018-04-18 DIAGNOSIS — Z66 Do not resuscitate: Secondary | ICD-10-CM | POA: Diagnosis present

## 2018-04-18 DIAGNOSIS — Z794 Long term (current) use of insulin: Secondary | ICD-10-CM | POA: Diagnosis not present

## 2018-04-18 DIAGNOSIS — F329 Major depressive disorder, single episode, unspecified: Secondary | ICD-10-CM | POA: Diagnosis not present

## 2018-04-18 DIAGNOSIS — F0390 Unspecified dementia without behavioral disturbance: Secondary | ICD-10-CM | POA: Diagnosis present

## 2018-04-18 DIAGNOSIS — Z7982 Long term (current) use of aspirin: Secondary | ICD-10-CM | POA: Diagnosis not present

## 2018-04-18 DIAGNOSIS — N390 Urinary tract infection, site not specified: Secondary | ICD-10-CM | POA: Diagnosis not present

## 2018-04-18 DIAGNOSIS — Z91013 Allergy to seafood: Secondary | ICD-10-CM | POA: Diagnosis not present

## 2018-04-18 DIAGNOSIS — E118 Type 2 diabetes mellitus with unspecified complications: Secondary | ICD-10-CM | POA: Diagnosis not present

## 2018-04-18 DIAGNOSIS — N499 Inflammatory disorder of unspecified male genital organ: Secondary | ICD-10-CM | POA: Diagnosis not present

## 2018-04-18 DIAGNOSIS — B961 Klebsiella pneumoniae [K. pneumoniae] as the cause of diseases classified elsewhere: Secondary | ICD-10-CM | POA: Diagnosis not present

## 2018-04-18 DIAGNOSIS — L0390 Cellulitis, unspecified: Secondary | ICD-10-CM | POA: Diagnosis not present

## 2018-04-18 DIAGNOSIS — E1165 Type 2 diabetes mellitus with hyperglycemia: Secondary | ICD-10-CM | POA: Diagnosis present

## 2018-04-18 DIAGNOSIS — Z79899 Other long term (current) drug therapy: Secondary | ICD-10-CM | POA: Diagnosis not present

## 2018-04-18 DIAGNOSIS — Z91048 Other nonmedicinal substance allergy status: Secondary | ICD-10-CM | POA: Diagnosis not present

## 2018-04-18 DIAGNOSIS — C9110 Chronic lymphocytic leukemia of B-cell type not having achieved remission: Secondary | ICD-10-CM | POA: Diagnosis not present

## 2018-04-18 DIAGNOSIS — Z833 Family history of diabetes mellitus: Secondary | ICD-10-CM | POA: Diagnosis not present

## 2018-04-18 DIAGNOSIS — M797 Fibromyalgia: Secondary | ICD-10-CM | POA: Diagnosis not present

## 2018-04-18 DIAGNOSIS — Z981 Arthrodesis status: Secondary | ICD-10-CM | POA: Diagnosis not present

## 2018-04-18 DIAGNOSIS — N400 Enlarged prostate without lower urinary tract symptoms: Secondary | ICD-10-CM | POA: Diagnosis present

## 2018-04-18 DIAGNOSIS — E785 Hyperlipidemia, unspecified: Secondary | ICD-10-CM | POA: Diagnosis present

## 2018-04-18 DIAGNOSIS — N492 Inflammatory disorders of scrotum: Secondary | ICD-10-CM | POA: Diagnosis present

## 2018-04-18 DIAGNOSIS — Z91030 Bee allergy status: Secondary | ICD-10-CM | POA: Diagnosis not present

## 2018-04-18 LAB — GLUCOSE, CAPILLARY
GLUCOSE-CAPILLARY: 114 mg/dL — AB (ref 70–99)
GLUCOSE-CAPILLARY: 163 mg/dL — AB (ref 70–99)
Glucose-Capillary: 60 mg/dL — ABNORMAL LOW (ref 70–99)
Glucose-Capillary: 296 mg/dL — ABNORMAL HIGH (ref 70–99)
Glucose-Capillary: 180 mg/dL — ABNORMAL HIGH (ref 70–99)
Glucose-Capillary: 56 mg/dL — ABNORMAL LOW (ref 70–99)

## 2018-04-18 LAB — CBC
HEMATOCRIT: 40.2 % (ref 39.0–52.0)
Hemoglobin: 13.2 g/dL (ref 13.0–17.0)
MCH: 30.5 pg (ref 26.0–34.0)
MCHC: 32.8 g/dL (ref 30.0–36.0)
MCV: 92.8 fL (ref 80.0–100.0)
NRBC: 0.2 % (ref 0.0–0.2)
PLATELETS: 253 10*3/uL (ref 150–400)
RBC: 4.33 MIL/uL (ref 4.22–5.81)
RDW: 12.6 % (ref 11.5–15.5)
WBC: 12.3 10*3/uL — AB (ref 4.0–10.5)

## 2018-04-18 LAB — BASIC METABOLIC PANEL
ANION GAP: 5 (ref 5–15)
BUN: 28 mg/dL — ABNORMAL HIGH (ref 8–23)
CHLORIDE: 106 mmol/L (ref 98–111)
CO2: 28 mmol/L (ref 22–32)
Calcium: 8.8 mg/dL — ABNORMAL LOW (ref 8.9–10.3)
Creatinine, Ser: 0.95 mg/dL (ref 0.61–1.24)
GFR calc Af Amer: >60 mL/min (ref >60)
GLUCOSE: 61 mg/dL — AB (ref 70–99)
POTASSIUM: 3.9 mmol/L (ref 3.5–5.1)
SODIUM: 139 mmol/L (ref 135–145)

## 2018-04-18 MED ORDER — MORPHINE SULFATE (PF) 2 MG/ML IV SOLN
2.0000 mg | INTRAVENOUS | Status: DC | PRN
  Filled 2018-04-18: qty 1

## 2018-04-18 MED ORDER — INSULIN DETEMIR 100 UNIT/ML ~~LOC~~ SOLN
29.0000 [IU] | Freq: Every day | SUBCUTANEOUS | Status: DC
  Administered 2018-04-18 – 2018-04-19 (×2): 29 [IU] via SUBCUTANEOUS
  Filled 2018-04-18 (×3): qty 0.29

## 2018-04-18 NOTE — Progress Notes (Signed)
Hypoglycemic Event  CBG: 60  Treatment: carb snack  Symptoms:   Follow-up CBG: Time:  CBG Result: 114  Possible Reasons for Event: pt did not eat dinner was in ED  Comments/MD notified: yes    Lezlie Octave

## 2018-04-18 NOTE — Progress Notes (Signed)
PROGRESS NOTE    Hayden [name]  KKX:[id number] DOB: 09/08/1940 DOA: 04/17/2018 PCP: Juanell Fairly, MD (Inactive)   Brief Narrative: Hayden [name] is a 77 y.o. male with medical history significant of cellulitis of the genital area, CLL, dementia, type 2 diabetes, BPH. Patient presented secondary to worsening recurrent cellulitis. Started on Clindamycin.   Assessment & Plan:   Active Problems:   CLL (chronic lymphocytic leukemia) (HCC)   Type 2 diabetes mellitus with complication (HCC)   Mild dementia (HCC)   Cellulitis of male genitalia   Cellulitis   Cellulitis Penis and scrotum. No purulent drainage.  Recurrent. Minimally improved. Significant pain with manipulation or urination. Leukocytosis improving. Patient will need continued treatment, with escalation to IV pain medications to help control symptoms -Continue clindamycin -Blood cultures pending -Norco prn -Add morphine prn severe pain  Abnormal urinalysis Patient is asymptomatic. -Urine culture (after clindamycin dose) -Norco as needed for pain  Type 2 diabetes mellitus Patient is insulin-dependent.  Currently on Levemir 36 units in addition to Humalog 3 units with meals. Hypoglycemic overnight. CO2 improved overnight. -Decrease to 20% of home regimen of Levemir to 29 units in addition to NovoLog 3 units with meals -Sliding scale insulin moderate scale 3 times before meals and at bedtime  Dementia Mild Per chart review records. -Continue Wellbutrin, Seroquel -Continue trazodone nightly  BPH Patient follows with [facility] as [facility]. -Continue Flomax -Bladder scan pending  CLL Asymptomatic.  Patient follows with oncology twice per year.   DVT prophylaxis: Lovenox Code Status:   Code Status: DNR Family Communication: None at bedside Disposition Plan: Discharge to facility likely in 24-48 hours pending clinical improvement of cellulitis   Consultants:   None  Procedures:    None  Antimicrobials:  Clindamycin (10/19>>   Subjective: Significant pain with manipulation of penis and urination. Pain medication not helping much overnight.  Objective: Vitals:   04/17/18 1654 04/17/18 1853 04/17/18 1908 04/18/18 0418  BP: 135/81 129/67 127/86 (!) 103/59  Pulse: 98 91 91 79  Resp: (!) 21 17 18 16   Temp:  98.2 F (36.8 C)  98.2 F (36.8 C)  TempSrc:  Oral  Oral  SpO2: 98% 100% 100% 99%  Weight:      Height:        Intake/Output Summary (Last 24 hours) at 04/18/2018 0957 Last data filed at 04/18/2018 0400 Gross per 24 hour  Intake 100 ml  Output 600 ml  Net -500 ml   Filed Weights   04/17/18 1405  Weight: 61.2 kg    Examination:  General exam: Appears calm and comfortable Respiratory system: Clear to auscultation. Respiratory effort normal. Cardiovascular system: S1 & S2 heard, RRR. No murmurs, rubs, gallops or clicks. Gastrointestinal system: Abdomen is nondistended, soft and nontender. No organomegaly or masses felt. Normal bowel sounds heard. Central nervous system: Alert and oriented. No focal neurological deficits. Extremities: No edema. No calf tenderness Skin: No cyanosis. Penis and scrotum erythematous and tender Psychiatry: Judgement and insight appear normal. Mood & affect appropriate.     Data Reviewed: I have personally reviewed following labs and imaging studies  CBC: Recent Labs  Lab 04/17/18 1609 04/18/18 0538  WBC 15.3* 12.3*  NEUTROABS 6.3  --   HGB 13.8 13.2  HCT 39.7 40.2  MCV 88.4 92.8  PLT 279 169   Basic Metabolic Panel: Recent Labs  Lab 04/17/18 1609 04/18/18 0538  NA 135 139  K 4.1 3.9  CL 104 106  CO2 18*  28  GLUCOSE 320* 61*  BUN 33* 28*  CREATININE 1.06 0.95  CALCIUM 9.3 8.8*   GFR: Estimated Creatinine Clearance: 55.5 mL/min (by C-G formula based on SCr of 0.95 mg/dL). Liver Function Tests: No results for input(s): AST, ALT, ALKPHOS, BILITOT, PROT, ALBUMIN in the last 168 hours. No  results for input(s): LIPASE, AMYLASE in the last 168 hours. No results for input(s): AMMONIA in the last 168 hours. Coagulation Profile: No results for input(s): INR, PROTIME in the last 168 hours. Cardiac Enzymes: No results for input(s): CKTOTAL, CKMB, CKMBINDEX, TROPONINI in the last 168 hours. BNP (last 3 results) No results for input(s): PROBNP in the last 8760 hours. HbA1C: No results for input(s): HGBA1C in the last 72 hours. CBG: Recent Labs  Lab 04/17/18 1713 04/17/18 2016 04/17/18 2224 04/18/18 0806 04/18/18 0852  GLUCAP 299* 231* 264* 60* 114*   Lipid Profile: No results for input(s): CHOL, HDL, LDLCALC, TRIG, CHOLHDL, LDLDIRECT in the last 72 hours. Thyroid Function Tests: No results for input(s): TSH, T4TOTAL, FREET4, T3FREE, THYROIDAB in the last 72 hours. Anemia Panel: No results for input(s): VITAMINB12, FOLATE, FERRITIN, TIBC, IRON, RETICCTPCT in the last 72 hours. Sepsis Labs: No results for input(s): PROCALCITON, LATICACIDVEN in the last 168 hours.  No results found for this or any previous visit (from the past 240 hour(s)).       Radiology Studies: No results found.      Scheduled Meds: . aspirin EC  81 mg Oral QHS  . buPROPion  300 mg Oral Daily  . cholecalciferol  1,000 Units Oral QHS  . docusate sodium  100 mg Oral BID  . enoxaparin (LOVENOX) injection  40 mg Subcutaneous Q24H  . feeding supplement (GLUCERNA SHAKE)  237 mL Oral TID BM  . insulin aspart  0-15 Units Subcutaneous TID WC  . insulin aspart  0-5 Units Subcutaneous QHS  . insulin aspart  3 Units Subcutaneous TID WC  . insulin detemir  36 Units Subcutaneous QHS  . loratadine  10 mg Oral Daily  . omega-3 acid ethyl esters  1 g Oral BID  . pravastatin  20 mg Oral q1800  . QUEtiapine  12.5 mg Oral BID  . tamsulosin  0.4 mg Oral QHS  . traZODone  150 mg Oral QHS  . vitamin B-12  1,000 mcg Oral Daily   Continuous Infusions: . clindamycin (CLEOCIN) IV 600 mg (04/18/18 0318)      LOS: 0 days     Cordelia Poche, MD [facility] 04/18/2018, 9:57 AM  If 7PM-7AM, please contact night-coverage [email or web address]

## 2018-04-18 NOTE — Progress Notes (Signed)
Hypoglycemic Event  CBG: 56  Treatment: carb snack and juice  Symptoms: non symptomatic  Follow-up CBG: 30 CBG Result:163  Possible Reasons for Event: probably didn't eat dinner.pt can't remember eating.  Comments/MD notified: yes    Hayden [name]

## 2018-04-19 DIAGNOSIS — N390 Urinary tract infection, site not specified: Secondary | ICD-10-CM

## 2018-04-19 LAB — CBC
HCT: 41.4 % (ref 39.0–52.0)
Hemoglobin: 13.6 g/dL (ref 13.0–17.0)
MCH: 30.6 pg (ref 26.0–34.0)
MCHC: 32.9 g/dL (ref 30.0–36.0)
MCV: 93.2 fL (ref 80.0–100.0)
PLATELETS: 214 10*3/uL (ref 150–400)
RBC: 4.44 MIL/uL (ref 4.22–5.81)
RDW: 12.6 % (ref 11.5–15.5)
WBC: 9.9 10*3/uL (ref 4.0–10.5)
nRBC: 0 % (ref 0.0–0.2)

## 2018-04-19 LAB — GLUCOSE, CAPILLARY
GLUCOSE-CAPILLARY: 275 mg/dL — AB (ref 70–99)
GLUCOSE-CAPILLARY: 189 mg/dL — AB (ref 70–99)
GLUCOSE-CAPILLARY: 159 mg/dL — AB (ref 70–99)
Glucose-Capillary: 220 mg/dL — ABNORMAL HIGH (ref 70–99)
Glucose-Capillary: 243 mg/dL — ABNORMAL HIGH (ref 70–99)

## 2018-04-19 LAB — HEMOGLOBIN A1C
Hgb A1c MFr Bld: 8.5 % — ABNORMAL HIGH (ref 4.8–5.6)
Mean Plasma Glucose: 197 mg/dL

## 2018-04-19 MED ORDER — SACCHAROMYCES BOULARDII 250 MG PO CAPS
250.0000 mg | ORAL_CAPSULE | Freq: Two times a day (BID) | ORAL | Status: DC
  Administered 2018-04-19 – 2018-04-20 (×3): 250 mg via ORAL
  Filled 2018-04-19 (×3): qty 1

## 2018-04-19 MED ORDER — SODIUM CHLORIDE 0.9 % IV SOLN
1.0000 g | INTRAVENOUS | Status: DC
  Administered 2018-04-19: 1 g via INTRAVENOUS
  Filled 2018-04-19: qty 10
  Filled 2018-04-19: qty 1

## 2018-04-19 NOTE — Progress Notes (Signed)
PROGRESS NOTE    Hayden [name]  [id number]:[id number] DOB: 03/05/1941 DOA: 04/17/2018 PCP: Juanell Fairly, MD (Inactive)   Brief Narrative: Hayden [name] is a 77 y.o. male with medical history significant of cellulitis of the genital area, CLL, dementia, type 2 diabetes, BPH. Patient presented secondary to worsening recurrent cellulitis. Started on Clindamycin.   Assessment & Plan:   Active Problems:   CLL (chronic lymphocytic leukemia) (HCC)   Type 2 diabetes mellitus with complication (HCC)   Mild dementia (HCC)   Cellulitis of male genitalia   Cellulitis   Cellulitis Penis and scrotum. No purulent drainage.  Recurrent. Leukocytosis resolved. Continues to improve slowly but not resolved. Blood cultures no growth to date. -Switch from Clindamycin IV to Clindamycin PO and watch clinical course -Blood cultures pending -Norco prn -Continue morphine prn severe pain -Add Florastor  Abnormal urinalysis Patient with some pain with urination. Unsure if this is related to UTI vs cellulitis. Urine culture significant for GNR. -Add ceftriaxone for empiric coverage  Type 2 diabetes mellitus Patient is insulin-dependent.  Currently on Levemir 36 units in addition to Humalog 3 units with meals. Hypoglycemic overnight. CO2 improved overnight. -Continue 20% of home regimen of Levemir at 29 units in addition to NovoLog 3 units with meals -Sliding scale insulin moderate scale 3 times before meals and at bedtime  Hypoglycemia Recurrent. Patient apparently did not get dinner yesterday which is the same reason as the previous day -Continue blood glucose monitoring  Dementia Mild Per chart review records. -Continue Wellbutrin, Seroquel -Continue trazodone nightly  BPH [facility] as [facility]. -Continue Flomax  CLL Asymptomatic.  Patient follows with oncology twice per year.   DVT prophylaxis: Lovenox Code Status:   Code Status: DNR Family  Communication: None at bedside Disposition Plan: Discharge to facility likely in 24 hours pending clinical improvement of cellulitis   Consultants:   None  Procedures:   None  Antimicrobials:  Clindamycin (10/19>>  Ceftriaxone (10/20>>   Subjective: Pain has improved somewhat.  Objective: Vitals:   04/18/18 0418 04/18/18 1346 04/18/18 2004 04/19/18 0423  BP: (!) 103/59 122/62 119/63 (!) 131/55  Pulse: 79 83 78 74  Resp: 16 20 18 16   Temp: 98.2 F (36.8 C) 98.9 F (37.2 C) 98.6 F (37 C) 98.2 F (36.8 C)  TempSrc: Oral Oral Oral Oral  SpO2: 99%  100% 100%  Weight:      Height:        Intake/Output Summary (Last 24 hours) at 04/19/2018 0936 Last data filed at 04/19/2018 0857 Gross per 24 hour  Intake 770 ml  Output 1775 ml  Net -1005 ml   Filed Weights   04/17/18 1405  Weight: 61.2 kg    Examination:  General exam: Appears calm and comfortable Respiratory system: Clear to auscultation. Respiratory effort normal. Cardiovascular system: S1 & S2 heard, RRR. No murmurs, rubs, gallops or clicks. Gastrointestinal system: Abdomen is nondistended, soft and nontender. Normal bowel sounds heard. Central nervous system: Alert and oriented. No focal neurological deficits. Extremities: No edema. No calf tenderness Skin: No cyanosis. Penis and scrotum with erythema and tenderness on manipulation Psychiatry: Judgement and insight appear normal. Mood & affect appropriate.    Data Reviewed: I have personally reviewed following labs and imaging studies  CBC: Recent Labs  Lab 04/17/18 1609 04/18/18 0538 04/19/18 0820  WBC 15.3* 12.3* 9.9  NEUTROABS 6.3  --   --   HGB 13.8 13.2 13.6  HCT 39.7 40.2 41.4  MCV 88.4 92.8 93.2  PLT 279 253 213   Basic Metabolic Panel: Recent Labs  Lab 04/17/18 1609 04/18/18 0538  NA 135 139  K 4.1 3.9  CL 104 106  CO2 18* 28  GLUCOSE 320* 61*  BUN 33* 28*  CREATININE 1.06 0.95  CALCIUM 9.3 8.8*   GFR: Estimated  Creatinine Clearance: 55.5 mL/min (by C-G formula based on SCr of 0.95 mg/dL). Liver Function Tests: No results for input(s): AST, ALT, ALKPHOS, BILITOT, PROT, ALBUMIN in the last 168 hours. No results for input(s): LIPASE, AMYLASE in the last 168 hours. No results for input(s): AMMONIA in the last 168 hours. Coagulation Profile: No results for input(s): INR, PROTIME in the last 168 hours. Cardiac Enzymes: No results for input(s): CKTOTAL, CKMB, CKMBINDEX, TROPONINI in the last 168 hours. BNP (last 3 results) No results for input(s): PROBNP in the last 8760 hours. HbA1C: Recent Labs    04/18/18 0538  HGBA1C 8.5*   CBG: Recent Labs  Lab 04/18/18 1649 04/18/18 2001 04/18/18 2104 04/19/18 0535 04/19/18 0750  GLUCAP 180* 56* 163* 275* 189*   Lipid Profile: No results for input(s): CHOL, HDL, LDLCALC, TRIG, CHOLHDL, LDLDIRECT in the last 72 hours. Thyroid Function Tests: No results for input(s): TSH, T4TOTAL, FREET4, T3FREE, THYROIDAB in the last 72 hours. Anemia Panel: No results for input(s): VITAMINB12, FOLATE, FERRITIN, TIBC, IRON, RETICCTPCT in the last 72 hours. Sepsis Labs: No results for input(s): PROCALCITON, LATICACIDVEN in the last 168 hours.  Recent Results (from the past 240 hour(s))  Culture, Urine     Status: Abnormal (Preliminary result)   Collection Time: 04/17/18  5:06 PM  Result Value Ref Range Status   Specimen Description   Final    URINE, CLEAN CATCH Performed at [facility], [place] [address] [place], [place] [address]    Special Requests   Final    NONE Performed at [facility], [place] [address] [place], [place] [address]    Culture >=100,000 COLONIES/mL GRAM NEGATIVE RODS (A)  Final   Report Status PENDING  Incomplete  Culture, blood (routine x 2)     Status: None (Preliminary result)   Collection Time: 04/17/18  6:42 PM  Result Value Ref Range Status   Specimen Description   Final    BLOOD BLOOD LEFT  FOREARM Performed at [place] [address], [place], [place] [address]    Special Requests   Final    BOTTLES DRAWN AEROBIC AND ANAEROBIC Blood Culture adequate volume Performed at [place] [address], [place], [place] [address]    Culture   Final    NO GROWTH 2 DAYS Performed at [place] [address], [place], [place] [address]    Report Status PENDING  Incomplete  Culture, blood (routine x 2)     Status: None (Preliminary result)   Collection Time: 04/17/18  6:50 PM  Result Value Ref Range Status   Specimen Description   Final    BLOOD RIGHT FOREARM Performed at [place] [address], [place], [place] [address]    Special Requests   Final    BOTTLES DRAWN AEROBIC AND ANAEROBIC Blood Culture adequate volume Performed at [facility] [address], [place], [place] [address]    Culture   Final    NO GROWTH 2 DAYS Performed at [facility] [address], [place], [place] [address]    Report Status  PENDING  Incomplete         Radiology Studies: No results found.      Scheduled Meds: . aspirin EC  81 mg Oral QHS  . buPROPion  300 mg Oral Daily  . cholecalciferol  1,000 Units Oral QHS  . docusate sodium  100 mg Oral BID  . enoxaparin (LOVENOX) injection  40 mg Subcutaneous Q24H  . feeding supplement (GLUCERNA SHAKE)  237 mL Oral TID BM  . insulin aspart  0-15 Units Subcutaneous TID WC  . insulin aspart  0-5 Units Subcutaneous QHS  . insulin aspart  3 Units Subcutaneous TID WC  . insulin detemir  29 Units Subcutaneous QHS  . loratadine  10 mg Oral Daily  . omega-3 acid ethyl esters  1 g Oral BID  . pravastatin  20 mg Oral q1800  . QUEtiapine  12.5 mg Oral BID  . tamsulosin  0.4 mg Oral QHS  . traZODone  150 mg Oral QHS  . vitamin B-12  1,000 mcg Oral Daily   Continuous Infusions: . clindamycin (CLEOCIN) IV 600 mg (04/19/18 0406)     LOS:  1 day     Cordelia Poche, MD [facility] 04/19/2018, 9:36 AM  If 7PM-7AM, please contact night-coverage [email or web address]
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
## 2018-04-20 LAB — URINE CULTURE: Culture: >=100000 — AB

## 2018-04-20 LAB — GLUCOSE, CAPILLARY
Glucose-Capillary: 94 mg/dL (ref 70–99)
Glucose-Capillary: 143 mg/dL — ABNORMAL HIGH (ref 70–99)

## 2018-04-20 MED ORDER — CLINDAMYCIN HCL 300 MG PO CAPS
300.0000 mg | ORAL_CAPSULE | Freq: Three times a day (TID) | ORAL | Status: DC
  Administered 2018-04-20: 300 mg via ORAL
  Filled 2018-04-20: qty 1

## 2018-04-20 MED ORDER — SACCHAROMYCES BOULARDII 250 MG PO CAPS: 250.0000 mg | ORAL_CAPSULE | Freq: Two times a day (BID) | ORAL | 0 refills | Status: AC

## 2018-04-20 MED ORDER — CEPHALEXIN 500 MG PO CAPS: 500.0000 mg | ORAL_CAPSULE | Freq: Two times a day (BID) | ORAL | 0 refills | Status: DC

## 2018-04-20 MED ORDER — CLINDAMYCIN HCL 300 MG PO CAPS: 300.0000 mg | ORAL_CAPSULE | Freq: Three times a day (TID) | ORAL | 0 refills | Status: AC

## 2018-04-20 MED ORDER — SACCHAROMYCES BOULARDII 250 MG PO CAPS: 250.0000 mg | ORAL_CAPSULE | Freq: Two times a day (BID) | ORAL | Status: DC

## 2018-04-20 MED ORDER — CLINDAMYCIN HCL 300 MG PO CAPS: 300.0000 mg | ORAL_CAPSULE | Freq: Three times a day (TID) | ORAL | 0 refills | Status: DC

## 2018-04-20 MED ORDER — CEPHALEXIN 500 MG PO CAPS: 500.0000 mg | ORAL_CAPSULE | Freq: Two times a day (BID) | ORAL | 0 refills | Status: AC

## 2018-04-20 MED ORDER — CEPHALEXIN 500 MG PO CAPS
500.0000 mg | ORAL_CAPSULE | Freq: Two times a day (BID) | ORAL | Status: DC
  Administered 2018-04-20: 500 mg via ORAL
  Filled 2018-04-20: qty 1

## 2018-04-20 NOTE — Discharge Summary (Signed)
Physician Discharge Summary  Hayden [name]:[id number] DOB: 10-Nov-1940 DOA: 04/17/2018  PCP: Juanell Fairly, MD (Inactive)  Admit date: 04/17/2018 Discharge date: 04/20/2018  Admitted From: ALF Disposition: ALF  Recommendations for Outpatient Follow-up:  1. Follow up with PCP in 1 week 2. Please follow up on the following pending results: Blood cultures  Home Health: None Equipment/Devices: None  Discharge Condition: Stable CODE STATUS: DNR Diet recommendation: Heart healthy/carb modified   Brief/Interim Summary:  Chief Complaint: Scrotal infection and pain  HPI: Hayden [name] is a 77 y.o. male with medical history significant of cellulitis of the [place] area, CLL, dementia, type 2 diabetes, BPH.  Patient presented secondary to worsening erythema and pain of his penis and scrotal area over the last few weeks.  Patient reports that after his last admission, his cellulitis improved and resolved.  He reports receiving some analgesics at the nursing facility which helped his pain a little bit.  Symptoms are similar to previous admission.  ED Course: Vitals: Afebrile, normal pulse, normal respirations, normotensive, on room air. Labs: CO2 of 18, glucose of 320, BUN of 33, creatinine of 1.06, WBC of 15.3 Imaging: None Medications/Course: Clindamyciin, fentanyl, Zofran, 500 mL NS bolus  Hospital course:  Cellulitis Genital area. Treated empirically with Clindamycin with good clinical response. Transitioned to oral regimen. Total 10 day course. Florastor added on discharge. Patient not requiring analgesic treatments. Blood cultures no growth to date.  UTI Klebsiella pneumoniae. Started empirically on ceftriaxone and transitioned to Keflex on discharge. Total 7 day course.  Diabetes mellitus, type 2 Insulin dependent. Some hypoglycemia during hospitalization secondary to issues with oral intake. Resolved. Hemoglobin A1C of 8.5%.   Discharge Diagnoses:  Active  Problems:   CLL (chronic lymphocytic leukemia) (HCC)   Type 2 diabetes mellitus with complication (HCC)   Mild dementia (HCC)   Cellulitis of male genitalia   Cellulitis    Discharge Instructions  Discharge Instructions    Call MD for:  severe uncontrolled pain   Complete by:  As directed    Call MD for:  temperature >100.4   Complete by:  As directed    Increase activity slowly   Complete by:  As directed      Allergies as of 04/20/2018      Reactions   Bee Venom Other (See Comments)   Bump where stung and took days to go away   Glucophage [metformin] Diarrhea   "i will never take it again -stool incontinent "   Shellfish Allergy Swelling   Grass Extracts [gramineae Pollens]    Tobacco [nicotiana Tabacum]       Medication List    STOP taking these medications   HYDROcodone-acetaminophen 5-325 MG tablet Commonly known as:  NORCO/VICODIN     TAKE these medications   aspirin 81 MG tablet Take 81 mg by mouth at bedtime.   bacitracin ointment Apply topically 2 (two) times daily.   buPROPion 300 MG 24 hr tablet Commonly known as:  WELLBUTRIN XL Take 300 mg by mouth daily.   cephALEXin 500 MG capsule Commonly known as:  KEFLEX Take 1 capsule (500 mg total) by mouth every 12 (twelve) hours for 6 days.   cetirizine 10 MG tablet Commonly known as:  ZYRTEC Take 10 mg by mouth daily as needed for allergies.   clindamycin 300 MG capsule Commonly known as:  CLEOCIN Take 1 capsule (300 mg total) by mouth every 8 (eight) hours for 7 days.   DIMETHICONE-ZINC OXIDE EX  Apply 1 application topically 2 (two) times daily.   docusate sodium 100 MG capsule Commonly known as:  COLACE Take 100 mg by mouth 2 (two) times daily.   EPINEPHrine 0.15 MG/0.3ML injection Commonly known as:  EPIPEN JR Inject 0.15 mg into the muscle as needed for anaphylaxis.   feeding supplement (GLUCERNA SHAKE) Liqd Take 237 mLs by mouth 3 (three) times daily between meals.   fish  oil-omega-3 fatty acids 1000 MG capsule Take 1 g by mouth 2 (two) times daily.   HUMALOG KWIKPEN 100 UNIT/ML KiwkPen Generic drug:  insulin lispro Inject 3 Units into the skin 3 (three) times daily before meals.   hydrOXYzine 25 MG tablet Commonly known as:  ATARAX/VISTARIL Take 25 mg by mouth 3 (three) times daily as needed for itching.   LEVEMIR FLEXTOUCH 100 UNIT/ML Pen Generic drug:  Insulin Detemir Inject 36 Units into the skin at bedtime.   lovastatin 20 MG tablet Commonly known as:  MEVACOR Take 20 mg by mouth at bedtime.   naproxen sodium 220 MG tablet Commonly known as:  ALEVE Take 220 mg by mouth every 12 (twelve) hours as needed (pain).   neomycin-bacitracin-polymyxin Oint Commonly known as:  NEOSPORIN Apply 1 application topically as needed for wound care (left shoulder).   QUEtiapine 25 MG tablet Commonly known as:  SEROQUEL Take 12.5 mg by mouth 2 (two) times daily.   saccharomyces boulardii 250 MG capsule Commonly known as:  FLORASTOR Take 1 capsule (250 mg total) by mouth 2 (two) times daily for 14 days.   tamsulosin 0.4 MG Caps capsule Commonly known as:  FLOMAX Take 0.4 mg by mouth at bedtime.   traZODone 100 MG tablet Commonly known as:  DESYREL Take 150 mg by mouth at bedtime.   vitamin B-12 1000 MCG tablet Commonly known as:  CYANOCOBALAMIN Take 1,000 mcg by mouth daily.   Vitamin D3 1000 units Caps Take 1 capsule by mouth at bedtime.      Follow-up Information    Juanell Fairly, MD. Schedule an appointment as soon as possible for a visit in 1 week(s).   Specialty:  Specialist Contact information: [address] [place] [address] [phone]          Allergies  Allergen Reactions  . Bee Venom Other (See Comments)    Bump where stung and took days to go away  . Glucophage [Metformin] Diarrhea    "i will never take it again -stool incontinent "  . Shellfish Allergy Swelling  . Grass Extracts [Gramineae Pollens]     . Tobacco [Nicotiana Tabacum]     Consultations:  None   Procedures/Studies: Dg Chest Port 1 View  Result Date: 04/07/2018 CLINICAL DATA:  Chest wall pain post fall, history diabetes mellitus, leukemia, GERD, fibromyalgia EXAM: PORTABLE CHEST 1 VIEW COMPARISON:  Portable exam 1029 hours without priors for comparison FINDINGS: Normal heart size, mediastinal contours, and pulmonary vascularity. Atherosclerotic calcification aorta. Lungs clear. No infiltrate, pleural effusion or pneumothorax. Bones demineralized. IMPRESSION: No acute abnormalities. Electronically Signed   By: Lavonia Dana M.D.   On: 04/07/2018 11:28       Subjective: No pain. No dysuria.  Discharge Exam: Vitals:   04/19/18 2021 04/20/18 0354  BP: 116/77 (!) 122/56  Pulse: 81 86  Resp: 20 16  Temp: 98.4 F (36.9 C) 97.6 F (36.4 C)  SpO2: 100% 99%   Vitals:   04/19/18 1339 04/19/18 1341 04/19/18 2021 04/20/18 0354  BP: (!) 113/59 (!) 106/54 116/77 Marland Kitchen)  122/56  Pulse: 79 81 81 86  Resp: 18 18 20 16   Temp: 98.3 F (36.8 C) 98.4 F (36.9 C) 98.4 F (36.9 C) 97.6 F (36.4 C)  TempSrc:   Oral Oral  SpO2: (!) 77% 99% 100% 99%  Weight:      Height:        General: Pt is alert, awake, not in acute distress Cardiovascular: RRR, S1/S2 +, no rubs, no gallops Respiratory: CTA bilaterally, no wheezing, no rhonchi Abdominal: Soft, NT, ND, bowel sounds + Extremities: no edema, no cyanosis Skin: penis and scrotum with erythema    The results of significant diagnostics from this hospitalization (including imaging, microbiology, ancillary and laboratory) are listed below for reference.     Microbiology: Recent Results (from the past 240 hour(s))  Culture, Urine     Status: Abnormal   Collection Time: 04/17/18  5:06 PM  Result Value Ref Range Status   Specimen Description   Final    URINE, CLEAN CATCH Performed at [facility] [address] [place], [place] [address]    Special  Requests   Final    NONE Performed at [facility], [place] [address] [place], [place] [address]    Culture >=100,000 COLONIES/mL KLEBSIELLA PNEUMONIAE (A)  Final   Report Status 04/20/2018 FINAL  Final   Organism ID, Bacteria KLEBSIELLA PNEUMONIAE (A)  Final      Susceptibility   Klebsiella pneumoniae - MIC*    AMPICILLIN >=32 RESISTANT Resistant     CEFAZOLIN <=4 SENSITIVE Sensitive     CEFTRIAXONE <=1 SENSITIVE Sensitive     CIPROFLOXACIN <=0.25 SENSITIVE Sensitive     GENTAMICIN <=1 SENSITIVE Sensitive     IMIPENEM <=0.25 SENSITIVE Sensitive     NITROFURANTOIN 64 INTERMEDIATE Intermediate     TRIMETH/SULFA <=20 SENSITIVE Sensitive     AMPICILLIN/SULBACTAM 8 SENSITIVE Sensitive     PIP/TAZO <=4 SENSITIVE Sensitive     Extended ESBL NEGATIVE Sensitive     * >=100,000 COLONIES/mL KLEBSIELLA PNEUMONIAE  Culture, blood (routine x 2)     Status: None (Preliminary result)   Collection Time: 04/17/18  6:42 PM  Result Value Ref Range Status   Specimen Description   Final    BLOOD BLOOD LEFT FOREARM Performed at [facility] [address], [place], [place] [address]    Special Requests   Final    BOTTLES DRAWN AEROBIC AND ANAEROBIC Blood Culture adequate volume Performed at [facility] [address] [place], [place] [address]    Culture   Final    NO GROWTH 3 DAYS Performed at [facility], [place] [address] [place], [place] [address]    Report Status PENDING  Incomplete  Culture, blood (routine x 2)     Status: None (Preliminary result)   Collection Time: 04/17/18  6:50 PM  Result Value Ref Range Status   Specimen Description   Final    BLOOD RIGHT FOREARM Performed at [facility] [address] [place], [place] [address]    Special Requests   Final    BOTTLES DRAWN AEROBIC AND ANAEROBIC Blood Culture adequate volume Performed at [facility] [address],  [place], [place] [address]    Culture   Final    NO GROWTH 3 DAYS Performed at [facility], [place] [address], [place],  [address]    Report Status PENDING  Incomplete     Labs: BNP (last  3 results) No results for input(s): BNP in the last 8760 hours. Basic Metabolic Panel: Recent Labs  Lab 04/17/18 1609 04/18/18 0538  NA 135 139  K 4.1 3.9  CL 104 106  CO2 18* 28  GLUCOSE 320* 61*  BUN 33* 28*  CREATININE 1.06 0.95  CALCIUM 9.3 8.8*   Liver Function Tests: No results for input(s): AST, ALT, ALKPHOS, BILITOT, PROT, ALBUMIN in the last 168 hours. No results for input(s): LIPASE, AMYLASE in the last 168 hours. No results for input(s): AMMONIA in the last 168 hours. CBC: Recent Labs  Lab 04/17/18 1609 04/18/18 0538 04/19/18 0820  WBC 15.3* 12.3* 9.9  NEUTROABS 6.3  --   --   HGB 13.8 13.2 13.6  HCT 39.7 40.2 41.4  MCV 88.4 92.8 93.2  PLT 279 253 214   Cardiac Enzymes: No results for input(s): CKTOTAL, CKMB, CKMBINDEX, TROPONINI in the last 168 hours. BNP: Invalid input(s): POCBNP CBG: Recent Labs  Lab 04/19/18 0750 04/19/18 1219 04/19/18 1603 04/19/18 2022 04/20/18 0749  GLUCAP 189* 159* 220* 243* 94   D-Dimer No results for input(s): DDIMER in the last 72 hours. Hgb A1c Recent Labs    04/18/18 0538  HGBA1C 8.5*   Lipid Profile No results for input(s): CHOL, HDL, LDLCALC, TRIG, CHOLHDL, LDLDIRECT in the last 72 hours. Thyroid function studies No results for input(s): TSH, T4TOTAL, T3FREE, THYROIDAB in the last 72 hours.  Invalid input(s): FREET3 Anemia work up No results for input(s): VITAMINB12, FOLATE, FERRITIN, TIBC, IRON, RETICCTPCT in the last 72 hours. Urinalysis    Component Value Date/Time   COLORURINE YELLOW 04/17/2018 1706   APPEARANCEUR CLOUDY (A) 04/17/2018 1706   LABSPEC 1.014 04/17/2018 1706   PHURINE 7.0 04/17/2018 1706   GLUCOSEU >=500 (A) 04/17/2018 1706   HGBUR NEGATIVE 04/17/2018 1706   BILIRUBINUR NEGATIVE  04/17/2018 1706   KETONESUR 20 (A) 04/17/2018 1706   PROTEINUR NEGATIVE 04/17/2018 1706   NITRITE NEGATIVE 04/17/2018 1706   LEUKOCYTESUR LARGE (A) 04/17/2018 1706   Sepsis Labs Invalid input(s): PROCALCITONIN,  WBC,  LACTICIDVEN Microbiology Recent Results (from the past 240 hour(s))  Culture, Urine     Status: Abnormal   Collection Time: 04/17/18  5:06 PM  Result Value Ref Range Status   Specimen Description   Final    URINE, CLEAN CATCH Performed at [facility], [place] [address], [place], [place] [address]    Special Requests   Final    NONE Performed at [facility], [place] [address], [place], [place] [address]    Culture >=100,000 COLONIES/mL KLEBSIELLA PNEUMONIAE (A)  Final   Report Status 04/20/2018 FINAL  Final   Organism ID, Bacteria KLEBSIELLA PNEUMONIAE (A)  Final      Susceptibility   Klebsiella pneumoniae - MIC*    AMPICILLIN >=32 RESISTANT Resistant     CEFAZOLIN <=4 SENSITIVE Sensitive     CEFTRIAXONE <=1 SENSITIVE Sensitive     CIPROFLOXACIN <=0.25 SENSITIVE Sensitive     GENTAMICIN <=1 SENSITIVE Sensitive     IMIPENEM <=0.25 SENSITIVE Sensitive     NITROFURANTOIN 64 INTERMEDIATE Intermediate     TRIMETH/SULFA <=20 SENSITIVE Sensitive     AMPICILLIN/SULBACTAM 8 SENSITIVE Sensitive     PIP/TAZO <=4 SENSITIVE Sensitive     Extended ESBL NEGATIVE Sensitive     * >=100,000 COLONIES/mL KLEBSIELLA PNEUMONIAE  Culture, blood (routine x 2)     Status: None (Preliminary result)   Collection Time: 04/17/18  6:42 PM  Result Value Ref  Range Status   Specimen Description   Final    BLOOD BLOOD LEFT FOREARM Performed at [facility] [address], [place], [place] [address]    Special Requests   Final    BOTTLES DRAWN AEROBIC AND ANAEROBIC Blood Culture adequate volume Performed at [facility] [address], [place], [place] [address]    Culture   Final    NO GROWTH 3 DAYS Performed at  [facility], [place] [address], [place], [place] [address]    Report Status PENDING  Incomplete  Culture, blood (routine x 2)     Status: None (Preliminary result)   Collection Time: 04/17/18  6:50 PM  Result Value Ref Range Status   Specimen Description   Final    BLOOD RIGHT FOREARM Performed at [facility] [address], [place], [place] [address]    Special Requests   Final    BOTTLES DRAWN AEROBIC AND ANAEROBIC Blood Culture adequate volume Performed at [facility] [address], [place], [place] [address]    Culture   Final    NO GROWTH 3 DAYS Performed at [facility], [place] [address], [place], [place] [address]    Report Status PENDING  Incomplete    SIGNED:   Cordelia Poche, MD [facility] 04/20/2018, 10:49 AM

## 2018-04-20 NOTE — Discharge Instructions (Signed)
Hayden [name],  You were admitted because of cellulitis. You also have bacteria in your urine which may or may not be contributory. Please keep your genital area dry. You will be discharged on TWO antibiotics. Please complete their course.

## 2018-04-20 NOTE — Progress Notes (Signed)
Gave report to RN at [facility]. Awaiting PTAR

## 2018-04-20 NOTE — NC FL2 (Signed)
[place] LEVEL OF CARE SCREENING TOOL     IDENTIFICATION  Patient Name: Hayden [name] Birthdate: 01/05/1941 Sex: male Admission Date (Current Location): 04/17/2018  [facility] and [place] Number:  Herbalist and Address:  [facility],  [place], [place]      Provider Number: [id number]  Attending Physician Name and Address:  [name], MD  Relative Name and Phone Number:       Current Level of Care: Hospital Recommended Level of Care: [place] Prior Approval Number:    Date Approved/Denied:   PASRR Number:    Discharge Plan: Other (Comment)(Assisted Living Facility)    Current Diagnoses: Patient Active Problem List   Diagnosis Date Noted  . Cellulitis 04/17/2018  . Cellulitis, scrotum 03/09/2018  . Cellulitis of male genitalia 03/08/2018  . Depression 03/08/2018  . BPH (benign prostatic hyperplasia) 03/08/2018  . Leukocytosis 03/08/2018  . Mild dementia ([place]) 10/23/2017  . UTI (urinary tract infection) 09/06/2017  . Hyperglycemia 09/06/2017  . ARF (acute renal failure) ([place]) 09/06/2017  . Altered mental status 09/06/2017  . Type 2 diabetes mellitus with complication ([place]) 54/00/8676  . CLL (chronic lymphocytic leukemia) ([place]) 09/01/2013    Orientation RESPIRATION BLADDER Height & Weight     Self, Time, Situation, Place  Normal Continent Weight: 135 lb (61.2 kg) Height:  5\' 8"  (172.7 cm)  BEHAVIORAL SYMPTOMS/MOOD NEUROLOGICAL BOWEL NUTRITION STATUS      Continent Diet(regular)  AMBULATORY STATUS COMMUNICATION OF NEEDS Skin   Independent Verbally Normal                       Personal Care Assistance Level of Assistance  Bathing, Feeding, Dressing Bathing Assistance: Independent Feeding assistance: Independent Dressing Assistance: Independent     Functional Limitations Info  Sight, Hearing, Speech Sight Info: Adequate Hearing Info: Impaired Speech Info: Adequate     SPECIAL CARE FACTORS FREQUENCY                       Contractures Contractures Info: Not present    Additional Factors Info  Code Status, Allergies Code Status Info: DNR Allergies Info: Bee Venom;Shellfish Allergy;Grass Extracts Gramineae Pollens;Tobacco Nicotiana Tabacum;Glucophage Metformin           Current Medications (04/20/2018):  This is the current hospital active medication list Current Facility-Administered Medications  Medication Dose Route Frequency Provider Last Rate Last Dose  . acetaminophen (TYLENOL) tablet 650 mg  650 mg Oral Q6H PRN [name], MD       Or  . acetaminophen (TYLENOL) suppository 650 mg  650 mg Rectal Q6H PRN [name], MD      . aspirin EC tablet 81 mg  81 mg Oral QHS [name], MD   81 mg at 04/19/18 2143  . buPROPion (WELLBUTRIN XL) 24 hr tablet 300 mg  300 mg Oral Daily [name], MD   300 mg at 04/20/18 0856  . cephALEXin (KEFLEX) capsule 500 mg  500 mg Oral Q12H [name], MD   500 mg at 04/20/18 1210  . cholecalciferol (VITAMIN D) tablet 1,000 Units  1,000 Units Oral QHS [name], MD   1,000 Units at 04/19/18 2146  . clindamycin (CLEOCIN) capsule 300 mg  300 mg Oral Q8H [name], MD   300 mg at 04/20/18 1210  . docusate sodium (COLACE) capsule 100 mg  100 mg Oral BID  [name], MD   100 mg at 04/20/18 0857  . enoxaparin (LOVENOX) injection 40 mg  40 mg Subcutaneous Q24H [name], MD   40 mg at 04/19/18 2147  . feeding supplement (GLUCERNA SHAKE) (GLUCERNA SHAKE) liquid 237 mL  237 mL Oral TID BM [name], MD   237 mL at 04/19/18 2148  . HYDROcodone-acetaminophen (NORCO/VICODIN) 5-325 MG per tablet 1-2 tablet  1-2 tablet Oral Q6H PRN [name], MD   1 tablet at 04/18/18 0936  . hydrOXYzine (ATARAX/VISTARIL) tablet 25 mg  25 mg Oral TID PRN [name], MD      . insulin aspart (novoLOG) injection 0-15 Units  0-15 Units Subcutaneous TID WC [name], MD   5  Units at 04/19/18 1636  . insulin aspart (novoLOG) injection 0-5 Units  0-5 Units Subcutaneous QHS [name], MD   2 Units at 04/19/18 2156  . insulin aspart (novoLOG) injection 3 Units  3 Units Subcutaneous TID WC [name], MD   3 Units at 04/20/18 [phone]  . insulin detemir (LEVEMIR) injection 29 Units  29 Units Subcutaneous QHS [name], MD   29 Units at 04/19/18 2155  . loratadine (CLARITIN) tablet 10 mg  10 mg Oral Daily [name], MD   10 mg at 04/20/18 0856  . morphine 2 MG/ML injection 2 mg  2 mg Intravenous Q4H PRN [name], MD      . omega-3 acid ethyl esters (LOVAZA) capsule 1 g  1 g Oral BID [name], MD   1 g at 04/20/18 0856  . ondansetron (ZOFRAN) tablet 4 mg  4 mg Oral Q6H PRN [name], MD       Or  . ondansetron (ZOFRAN) injection 4 mg  4 mg Intravenous Q6H PRN [name], MD      . pravastatin (PRAVACHOL) tablet 20 mg  20 mg Oral q1800 [name], MD   20 mg at 04/19/18 1636  . QUEtiapine (SEROQUEL) tablet 12.5 mg  12.5 mg Oral BID [name], MD   12.5 mg at 04/20/18 0856  . saccharomyces boulardii (FLORASTOR) capsule 250 mg  250 mg Oral BID [name], MD   250 mg at 04/20/18 0857  . tamsulosin (FLOMAX) capsule 0.4 mg  0.4 mg Oral QHS [name], MD   0.4 mg at 04/19/18 2143  . traZODone (DESYREL) tablet 150 mg  150 mg Oral QHS [name], MD   150 mg at 04/19/18 2143  . vitamin B-12 (CYANOCOBALAMIN) tablet 1,000 mcg  1,000 mcg Oral Daily [name], MD   1,000 mcg at 04/20/18 1572     Discharge Medications: Please see discharge summary for a list of discharge medications.  Relevant Imaging Results:  Relevant Lab Results:   Additional Information SSN   [id number]  Burnis Medin, LCSW
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
## 2018-04-20 NOTE — Progress Notes (Signed)
Patient returning to [facility]. Facility aware of patient's discharge and confirmed patient's ability to return. PTAR contacted, patient's sister [name] [phone]) notified. Patient's RN can call report to [phone], packet complete. CSW signing off, no other needs identified at this time.  Abundio Miu, [place] Social Worker [facility] Cell#: [phone]

## 2018-04-20 NOTE — Care Management Note (Addendum)
Case Management Note  Patient Details  Name: Hayden [name] MRN: [id number] Date of Birth: [birth year]  Subjective/Objective:                  DISCHARGED  Action/Plan: Note sent to md to reorder pt for home hhc through [facility] per Georgina Snell with Alvis [facility].  Expected Discharge Date:  04/20/18               Expected Discharge Plan:  Home/Self Care  In-House Referral:     Discharge planning Services  CM Consult  Post Acute Care Choice:    Choice offered to:     DME Arranged:    DME Agency:     HH Arranged:    HH Agency:     Status of Service:  Completed, signed off  If discussed at [employer] of Stay Meetings, dates discussed:    Additional Comments:  Leeroy Cha, RN 04/20/2018, 11:28 AM

## 2018-04-22 DIAGNOSIS — G4700 Insomnia, unspecified: Secondary | ICD-10-CM | POA: Diagnosis not present

## 2018-04-22 DIAGNOSIS — E7800 Pure hypercholesterolemia, unspecified: Secondary | ICD-10-CM | POA: Diagnosis not present

## 2018-04-22 DIAGNOSIS — F418 Other specified anxiety disorders: Secondary | ICD-10-CM | POA: Diagnosis not present

## 2018-04-22 DIAGNOSIS — B961 Klebsiella pneumoniae [K. pneumoniae] as the cause of diseases classified elsewhere: Secondary | ICD-10-CM | POA: Diagnosis not present

## 2018-04-22 DIAGNOSIS — Z9181 History of falling: Secondary | ICD-10-CM | POA: Diagnosis not present

## 2018-04-22 DIAGNOSIS — F0390 Unspecified dementia without behavioral disturbance: Secondary | ICD-10-CM | POA: Diagnosis not present

## 2018-04-22 DIAGNOSIS — E11319 Type 2 diabetes mellitus with unspecified diabetic retinopathy without macular edema: Secondary | ICD-10-CM | POA: Diagnosis not present

## 2018-04-22 DIAGNOSIS — Z794 Long term (current) use of insulin: Secondary | ICD-10-CM | POA: Diagnosis not present

## 2018-04-22 DIAGNOSIS — C9110 Chronic lymphocytic leukemia of B-cell type not having achieved remission: Secondary | ICD-10-CM | POA: Diagnosis not present

## 2018-04-22 DIAGNOSIS — Z7982 Long term (current) use of aspirin: Secondary | ICD-10-CM | POA: Diagnosis not present

## 2018-04-22 DIAGNOSIS — E1142 Type 2 diabetes mellitus with diabetic polyneuropathy: Secondary | ICD-10-CM | POA: Diagnosis not present

## 2018-04-22 DIAGNOSIS — N390 Urinary tract infection, site not specified: Secondary | ICD-10-CM | POA: Diagnosis not present

## 2018-04-22 DIAGNOSIS — N400 Enlarged prostate without lower urinary tract symptoms: Secondary | ICD-10-CM | POA: Diagnosis not present

## 2018-04-22 DIAGNOSIS — I10 Essential (primary) hypertension: Secondary | ICD-10-CM | POA: Diagnosis not present

## 2018-04-22 LAB — CULTURE, BLOOD (ROUTINE X 2)
CULTURE: NO GROWTH
CULTURE: NO GROWTH
SPECIAL REQUESTS: ADEQUATE
Special Requests: ADEQUATE

## 2018-04-23 ENCOUNTER — Encounter (HOSPITAL_COMMUNITY): Payer: Self-pay | Admitting: Emergency Medicine

## 2018-04-23 ENCOUNTER — Other Ambulatory Visit: Payer: Self-pay

## 2018-04-23 ENCOUNTER — Emergency Department (HOSPITAL_COMMUNITY)
Admission: EM | Admit: 2018-04-23 | Discharge: 2018-04-23 | Disposition: A | Discharge status: Home / Self Care | Payer: Medicare Other | Attending: Emergency Medicine | Admitting: Emergency Medicine

## 2018-04-23 ENCOUNTER — Emergency Department (HOSPITAL_COMMUNITY)
Admission: EM | Admit: 2018-04-23 | Discharge: 2018-04-23 | Disposition: A | Discharge status: Skilled Nursing Facility | Payer: Medicare Other | Source: Home / Self Care | Attending: Emergency Medicine | Admitting: Emergency Medicine

## 2018-04-23 DIAGNOSIS — B3742 Candidal balanitis: Secondary | ICD-10-CM | POA: Insufficient documentation

## 2018-04-23 DIAGNOSIS — E119 Type 2 diabetes mellitus without complications: Secondary | ICD-10-CM | POA: Insufficient documentation

## 2018-04-23 DIAGNOSIS — R21 Rash and other nonspecific skin eruption: Secondary | ICD-10-CM | POA: Diagnosis not present

## 2018-04-23 DIAGNOSIS — Z79899 Other long term (current) drug therapy: Secondary | ICD-10-CM | POA: Insufficient documentation

## 2018-04-23 DIAGNOSIS — R52 Pain, unspecified: Secondary | ICD-10-CM | POA: Diagnosis not present

## 2018-04-23 DIAGNOSIS — N4829 Other inflammatory disorders of penis: Secondary | ICD-10-CM | POA: Insufficient documentation

## 2018-04-23 DIAGNOSIS — Z856 Personal history of leukemia: Secondary | ICD-10-CM

## 2018-04-23 DIAGNOSIS — Z794 Long term (current) use of insulin: Secondary | ICD-10-CM

## 2018-04-23 DIAGNOSIS — F0390 Unspecified dementia without behavioral disturbance: Secondary | ICD-10-CM | POA: Insufficient documentation

## 2018-04-23 DIAGNOSIS — Z7982 Long term (current) use of aspirin: Secondary | ICD-10-CM | POA: Insufficient documentation

## 2018-04-23 DIAGNOSIS — L0390 Cellulitis, unspecified: Secondary | ICD-10-CM | POA: Diagnosis not present

## 2018-04-23 DIAGNOSIS — I959 Hypotension, unspecified: Secondary | ICD-10-CM | POA: Diagnosis not present

## 2018-04-23 DIAGNOSIS — R1030 Lower abdominal pain, unspecified: Secondary | ICD-10-CM | POA: Diagnosis present

## 2018-04-23 LAB — CBC WITH DIFFERENTIAL/PLATELET
ABS IMMATURE GRANULOCYTES: 0.05 10*3/uL (ref 0.00–0.07)
Basophils Absolute: 0 10*3/uL (ref 0.0–0.1)
Basophils Relative: 0 %
Eosinophils Absolute: 0.2 10*3/uL (ref 0.0–0.5)
Eosinophils Relative: 1 %
HCT: 37.2 % — ABNORMAL LOW (ref 39.0–52.0)
HEMOGLOBIN: 12.4 g/dL — AB (ref 13.0–17.0)
Immature Granulocytes: 0 %
LYMPHS ABS: 6.9 10*3/uL — AB (ref 0.7–4.0)
LYMPHS PCT: 52 %
MCH: 31.1 pg (ref 26.0–34.0)
MCHC: 33.3 g/dL (ref 30.0–36.0)
MCV: 93.2 fL (ref 80.0–100.0)
MONO ABS: 0.5 10*3/uL (ref 0.1–1.0)
MONOS PCT: 4 %
NEUTROS ABS: 5.8 10*3/uL (ref 1.7–7.7)
Neutrophils Relative %: 43 %
PLATELETS: 239 10*3/uL (ref 150–400)
RBC: 3.99 MIL/uL — AB (ref 4.22–5.81)
RDW: 12.5 % (ref 11.5–15.5)
WBC: 13.4 10*3/uL — AB (ref 4.0–10.5)
nRBC: 0 % (ref 0.0–0.2)

## 2018-04-23 LAB — URINALYSIS, ROUTINE W REFLEX MICROSCOPIC
BILIRUBIN URINE: NEGATIVE
Glucose, UA: 150 mg/dL — AB
HGB URINE DIPSTICK: NEGATIVE
Ketones, ur: 5 mg/dL — AB
LEUKOCYTES UA: NEGATIVE
Nitrite: NEGATIVE
Protein, ur: NEGATIVE mg/dL
Specific Gravity, Urine: 1.018 (ref 1.005–1.030)
pH: 7 (ref 5.0–8.0)

## 2018-04-23 LAB — BASIC METABOLIC PANEL
ANION GAP: 8 (ref 5–15)
BUN: 32 mg/dL — ABNORMAL HIGH (ref 8–23)
CO2: 22 mmol/L (ref 22–32)
Calcium: 9.1 mg/dL (ref 8.9–10.3)
Chloride: 105 mmol/L (ref 98–111)
Creatinine, Ser: 0.93 mg/dL (ref 0.61–1.24)
GFR calc Af Amer: >60 mL/min (ref >60)
GLUCOSE: 226 mg/dL — AB (ref 70–99)
POTASSIUM: 4.1 mmol/L (ref 3.5–5.1)
Sodium: 135 mmol/L (ref 135–145)

## 2018-04-23 MED ORDER — IBUPROFEN 800 MG PO TABS
800.0000 mg | ORAL_TABLET | Freq: Once | ORAL | Status: AC
  Administered 2018-04-23: 800 mg via ORAL
  Filled 2018-04-23: qty 1

## 2018-04-23 MED ORDER — NYSTATIN 100000 UNIT/GM EX POWD
Freq: Once | CUTANEOUS | Status: AC
  Administered 2018-04-23: 15:00:00 via TOPICAL
  Filled 2018-04-23: qty 15

## 2018-04-23 MED ORDER — FLUCONAZOLE 200 MG PO TABS: 200.0000 mg | ORAL_TABLET | Freq: Every day | ORAL | 0 refills | Status: DC

## 2018-04-23 MED ORDER — NYSTATIN 100000 UNIT/GM EX CREA: TOPICAL_CREAM | CUTANEOUS | 0 refills | Status: DC

## 2018-04-23 MED ORDER — IBUPROFEN 600 MG PO TABS: 600.0000 mg | ORAL_TABLET | Freq: Four times a day (QID) | ORAL | 0 refills | Status: DC | PRN

## 2018-04-23 NOTE — ED Triage Notes (Addendum)
Pt c/o genital pains that still on going since seen earlier today when he was seen and discharged with prescriptions. Pt still having pains while waiting for ride back to [facility] decided to check back in to be seen.
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
## 2018-04-23 NOTE — ED Notes (Signed)
PTAR called. PTAR stated there are 30 transports ahead of him

## 2018-04-23 NOTE — ED Provider Notes (Signed)
[facility] DEPT Provider Note   CSN: [id number] Arrival date & time: 04/23/18  1839     History   Chief Complaint No chief complaint on file.   HPI Hayden [name] is a 77 y.o. male.  The history is provided by the patient. No language interpreter was used.     77 year old male who was seen by me earlier today for pain genital region, diagnosed with candidal balanitis.  Patient was discharged by me with prescription for Diflucan, and nystatin cream.  Patient was waiting in the waiting room for his ride home when his pain returns.  He checked back in requesting for further pain management.  No new changes.   Past Medical History:  Diagnosis Date  . Depression   . Diabetes mellitus   . Enlarged prostate   . Fibromyalgia   . GERD (gastroesophageal reflux disease)    reflux intermittent  . Hyperlipidemia   . Leukemia [facility]) chronic lymphocytic   2008 diagnosed-monitored Dr     Patient Active Problem List   Diagnosis Date Noted  . Cellulitis 04/17/2018  . Cellulitis, scrotum 03/09/2018  . Cellulitis of male genitalia 03/08/2018  . Depression 03/08/2018  . BPH (benign prostatic hyperplasia) 03/08/2018  . Leukocytosis 03/08/2018  . 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 UTI (urinary tract infection) 09/06/2017  . Hyperglycemia 09/06/2017  . ARF (acute renal failure) ([place]) 09/06/2017  . Altered mental status 09/06/2017  . Type 2 diabetes mellitus with complication ([place]) 84/69/6295  . 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 ADENOIDECTOMY    . CARPAL TUNNEL RELEASE     rt hand  . CATARACT EXTRACTION, BILATERAL    . CERVICAL FUSION  1998  . CYSTOSCOPY  07/26/2012   Procedure: CYSTOSCOPY;  Surgeon: Ailene Rud, MD;  Location: [facility];  Service: Urology;  Laterality: N/A;  DIAGNOSTIC CYSTO PROSTATE ULTRASOUND      . PROSTATE BIOPSY  07/26/2012   Procedure: PROSTATE BIOPSY;  Surgeon: Ailene Rud, MD;  Location[facility];  Service: Urology;  Laterality: N/A;  . retinal micro aneurysms    . tonsil    . VASECTOMY    . VITRECTOMY          Home Medications    Prior to Admission medications   Medication Sig Start Date End Date Taking? Authorizing Provider  aspirin 81 MG tablet Take 81 mg by mouth every morning.     [provider]  bacitracin ointment Apply topically 2 (two) times daily. Patient not taking: Reported on 04/23/2018 03/11/18   [name], DO  buPROPion (WELLBUTRIN XL) 300 MG 24 hr tablet Take 300 mg by mouth every morning.     [provider]  cephALEXin (KEFLEX) 500 MG capsule Take 1 capsule (500 mg total) by mouth every 12 (twelve) hours for 6 days. 04/20/18 04/26/18  [name], MD  cetirizine (ZYRTEC) 10 MG tablet Take 10 mg by mouth daily as needed for allergies.     [provider]  Cholecalciferol (VITAMIN D3) 1000 UNITS CAPS Take 1 capsule by mouth every morning.     [provider]  clindamycin (CLEOCIN) 300 MG capsule Take 1 capsule (300 mg total) by mouth every 8 (eight) hours for 7 days. 04/20/18 04/27/18  [name], MD  DIMETHICONE-ZINC OXIDE EX Apply 1 application topically 2 (two) times daily.    [provider]  docusate sodium (COLACE) 100 MG capsule Take 100 mg by mouth daily.     [provider]  EPINEPHrine (EPIPEN JR) 0.15 MG/0.3ML injection Inject 0.15 mg into the muscle as needed for anaphylaxis.    [provider]  feeding supplement, GLUCERNA SHAKE, (GLUCERNA SHAKE) LIQD Take 237 mLs by mouth 3 (three) times daily between meals. 03/11/18   [name], [name], DO  fish oil-omega-3 fatty acids 1000 MG capsule Take 1 g by mouth 2 (two) times daily.     [provider]  fluconazole (DIFLUCAN) 200 MG tablet Take 1 tablet (200 mg total) by mouth daily. 04/23/18   [name], PA-C    HYDROcodone-acetaminophen (NORCO/VICODIN) 5-325 MG tablet Take 1 tablet by mouth every 6 (six) hours as needed for moderate pain.    [provider]  hydrOXYzine (ATARAX/VISTARIL) 25 MG tablet Take 25 mg by mouth 3 (three) times daily as needed for itching.    [provider]  insulin lispro (HUMALOG KWIKPEN) 100 UNIT/ML KiwkPen Inject 3 Units into the skin 3 (three) times daily before meals.     [provider]  LEVEMIR FLEXTOUCH 100 UNIT/ML Pen Inject 36 Units into the skin at bedtime.  07/16/15   [provider]  naproxen sodium (ALEVE) 220 MG tablet Take 220 mg by mouth every 12 (twelve) hours as needed (pain).    [provider]  neomycin-bacitracin-polymyxin (NEOSPORIN) OINT Apply 1 application topically as needed for wound care (left shoulder).    [provider]  nystatin cream (MYCOSTATIN) Apply to affected area 2 times daily 04/23/18   [name], PA-C  QUEtiapine (SEROQUEL) 25 MG tablet Take 12.5 mg by mouth 2 (two) times daily.    [provider]  saccharomyces boulardii (FLORASTOR) 250 MG capsule Take 1 capsule (250 mg total) by mouth 2 (two) times daily for 14 days. 04/20/18 05/04/18  [name], MD  tamsulosin (FLOMAX) 0.4 MG CAPS capsule Take 0.4 mg by mouth every evening.     [provider]  traZODone (DESYREL) 100 MG tablet Take 150 mg by mouth at bedtime.    [provider]  vitamin B-12 (CYANOCOBALAMIN) 1000 MCG tablet Take 1,000 mcg by mouth every morning.     [provider]  calcium citrate-vitamin D (CITRACAL+D) 315-200 MG-UNIT per tablet Take 1 tablet by mouth daily.  09/04/11  [provider]  terazosin (HYTRIN) 5 MG capsule Take 5 mg by mouth at bedtime.  09/04/11  [provider]    Family History Family History  Problem Relation Age of Onset  . Diabetes Father   . Diabetes Sister   . Diabetes Brother     Social History Social History   Tobacco Use  .  Smoking status: Never Smoker  . Smokeless tobacco: Never Used  Substance Use Topics  . Alcohol use: No    Frequency: Never  . Drug use: No     Allergies   Bee venom; Glucophage [metformin]; Shellfish allergy; Grass extracts [gramineae pollens]; and Tobacco [nicotiana tabacum]   Review of Systems Review of Systems  Constitutional: Negative for fever.  Skin: Positive for rash.     Physical Exam Updated Vital Signs BP 113/73 (BP Location: Left Arm)   Pulse 94   Temp 98.6 F (37 C) (Oral)   Resp 18   SpO2 100%   Physical Exam  Constitutional: He appears well-developed and well-nourished. No distress.  HENT:  Head: Atraumatic.  Eyes: Conjunctivae are normal.  Neck: Neck supple.  Cardiovascular: Normal rate and regular rhythm.  Pulmonary/Chest: Effort normal and breath sounds normal.  Abdominal: Soft. He exhibits no distension. There is no tenderness.  Genitourinary:  Genitourinary Comments: Similar to prior exam: erythematous skin changes with skin maceration to penis and scrotum  Neurological: He is alert.  Skin: No rash noted.  Psychiatric: He has a normal mood and affect.  Nursing note and vitals reviewed.    ED Treatments / Results  Labs (all labs ordered are listed, but only abnormal results are displayed) Labs Reviewed - No data to display  EKG None  Radiology No results found.  Procedures Procedures (including critical care time)  Medications Ordered in ED Medications - No data to display   Initial Impression / Assessment and Plan / ED Course  I have reviewed the triage vital signs and the nursing notes.  Pertinent labs & imaging results that were available during my care of the patient were reviewed by me and considered in my medical decision making (see chart for details).     BP 113/73 (BP Location: Left Arm)   Pulse 94   Temp 98.6 F (37 C) (Oral)   Resp 18   SpO2 100%    Final Clinical Impressions(s) / ED Diagnoses   Final  diagnoses:  Candidal balanitis    ED Discharge Orders    None     7:16 PM Pt was seen by me earlier today for pain to his genital region.  It appears he has candidal balanitis.  PT d/c home with diflucan and nystatin.  He checked back in after waiting in the waiting room for his ride.  He is well appearing, talking, walking and in no acute discomfort.  Ibuprofen given, encourage pt to f/u with PCP.    [name], PA-C 04/23/18 1917    Quintella Reichert, MD 04/24/18 [phone]

## 2018-04-23 NOTE — Discharge Instructions (Signed)
You have been diagnosed with having skin yeast infection.  Apply nystatin cream to affected area twice daily for 1 week.  Also take diflucan as prescribed for 1 week.  While taking diflucan, you may stop taking a cholesterol medication (mevacor) to decrease risk of drug to drug interaction.  Follow up closely with your doctor for further wound management.  Continue with your current antibiotic medication.  Continue to request for pain medication previously prescribed as needed.

## 2018-04-23 NOTE — ED Notes (Signed)
Bed: WA23 Expected date:  Expected time:  Means of arrival:  Comments: EMS 

## 2018-04-23 NOTE — ED Notes (Signed)
Pen pad for pt disposition was did not work correctly. Unable to obtain signature at time of discharge.

## 2018-04-23 NOTE — ED Triage Notes (Signed)
Pt arrived via [facility] from [facility] for groin pain.  Pt has cellulitis diagnosed for about a month. Pt has been receiving antibiotics, but refused to take antibiotic and pain medication this morning. So nursing facility called EMS  ao x 4  Hx of anxiety

## 2018-04-23 NOTE — ED Provider Notes (Signed)
[place] DEPT Provider Note   CSN: [id number] Arrival date & time: 04/23/18  1325     History   Chief Complaint Chief Complaint  Patient presents with  . Groin Pain    HPI KEIAN ODRISCOLL is a 77 y.o. male.  The history is provided by the patient and medical records. No language interpreter was used.  Groin Pain      78 year old male with history of diabetes, enlarged prostate, fibromyalgia, leukemia brought here via EMS from [facility] for groin pain.  Per nursing note, patient was sent here from the nursing facility because he refused to take his antibiotic today for his groin infection and he was also complaining of worsening groin pain and request to be seen in the hospital.. Patient was admitted on 10/19-10/22. Was diagnosed with cellulitis involving the genital area and was treated empirically with clindamycin with good response.  He was also diagnosed with Klebsiella pneumonia and was initially treated with Rocephin and then transition to Keflex.  He mentioned today his pain in his genital became much more intense, described as throbbing and sharp, 7 out of 10 but the pain has since subsided to 2 out of 10 after he received pain medication.  No report of fever, chills, dysuria, trouble breathing, productive cough.  Patient states he has been compliant with his medication.  Past Medical History:  Diagnosis Date  . Depression   . Diabetes mellitus   . Enlarged prostate   . Fibromyalgia   . GERD (gastroesophageal reflux disease)    reflux intermittent  . Hyperlipidemia   . 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 Cellulitis 04/17/2018  . Cellulitis, scrotum 03/09/2018  . Cellulitis of male genitalia 03/08/2018  . Depression 03/08/2018  . BPH (benign prostatic hyperplasia) 03/08/2018  . Leukocytosis 03/08/2018  . 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  UTI (urinary tract infection) 09/06/2017  . Hyperglycemia 09/06/2017  . ARF (acute renal failure) ([place]) 09/06/2017  . Altered mental status 09/06/2017  . Type 2 diabetes mellitus with complication ([place]) 19/62/2297  . 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 ADENOIDECTOMY    . CARPAL TUNNEL RELEASE     rt hand  . CATARACT EXTRACTION, BILATERAL    . CERVICAL FUSION  1998  . CYSTOSCOPY  07/26/2012   Procedure: CYSTOSCOPY;  Surgeon: Ailene Rud, MD;  Location: [facility];  Service: Urology;  Laterality: N/A;  DIAGNOSTIC CYSTO PROSTATE ULTRASOUND      . PROSTATE BIOPSY  07/26/2012   Procedure: PROSTATE BIOPSY;  Surgeon: Ailene Rud, MD;  Location: [facility];  Service: Urology;  Laterality: N/A;  . retinal micro aneurysms    . tonsil    . VASECTOMY    . VITRECTOMY          Home Medications    Prior to Admission medications   Medication Sig Start Date End Date Taking? Authorizing Provider  aspirin 81 MG tablet Take 81 mg by mouth at bedtime.     [provider]  bacitracin ointment Apply topically 2 (two) times daily. 03/11/18   [name], Omair Latif, DO  buPROPion (WELLBUTRIN XL) 300 MG 24 hr tablet Take 300 mg by mouth daily.    [provider]  cephALEXin (KEFLEX) 500 MG capsule Take 1 capsule (500 mg total)  by mouth every 12 (twelve) hours for 6 days. 04/20/18 04/26/18  [name], MD  cetirizine (ZYRTEC) 10 MG tablet Take 10 mg by mouth daily as needed for allergies.     [provider]  Cholecalciferol (VITAMIN D3) 1000 UNITS CAPS Take 1 capsule by mouth at bedtime.    [provider]  clindamycin (CLEOCIN) 300 MG capsule Take 1 capsule (300 mg total) by mouth every 8 (eight) hours for 7 days. 04/20/18 04/27/18  [name], MD  DIMETHICONE-ZINC OXIDE EX Apply 1 application topically 2 (two) times daily.    [provider]  docusate sodium (COLACE) 100 MG capsule Take 100 mg by mouth 2 (two) times daily.    [provider]  EPINEPHrine (EPIPEN JR) 0.15 MG/0.3ML injection Inject 0.15 mg into the muscle as needed for anaphylaxis.    [provider]  feeding supplement, GLUCERNA SHAKE, (GLUCERNA SHAKE) LIQD Take 237 mLs by mouth 3 (three) times daily between meals. 03/11/18   [name], [name], DO  fish oil-omega-3 fatty acids 1000 MG capsule Take 1 g by mouth 2 (two) times daily.     [provider]  hydrOXYzine (ATARAX/VISTARIL) 25 MG tablet Take 25 mg by mouth 3 (three) times daily as needed for itching.    [provider]  insulin lispro (HUMALOG KWIKPEN) 100 UNIT/ML KiwkPen Inject 3 Units into the skin 3 (three) times daily before meals.     [provider]  LEVEMIR FLEXTOUCH 100 UNIT/ML Pen Inject 36 Units into the skin at bedtime.  07/16/15   [provider]  lovastatin (MEVACOR) 20 MG tablet Take 20 mg by mouth at bedtime.    [provider]  naproxen sodium (ALEVE) 220 MG tablet Take 220 mg by mouth every 12 (twelve) hours as needed (pain).    [provider]  neomycin-bacitracin-polymyxin (NEOSPORIN) OINT Apply 1 application topically as needed for wound care (left shoulder).    [provider]  QUEtiapine (SEROQUEL) 25 MG tablet Take 12.5 mg by mouth 2 (two) times daily.    [provider]  saccharomyces boulardii (FLORASTOR) 250 MG capsule Take 1 capsule (250 mg total) by mouth 2 (two) times daily for 14 days. 04/20/18 05/04/18  [name], MD  tamsulosin (FLOMAX) 0.4 MG CAPS capsule Take 0.4 mg by mouth at bedtime.    [provider]  traZODone (DESYREL) 100 MG tablet Take 150 mg by mouth at bedtime.    [provider]  vitamin B-12 (CYANOCOBALAMIN) 1000 MCG tablet Take 1,000 mcg by mouth daily.    [provider]  calcium citrate-vitamin D (CITRACAL+D) 315-200 MG-UNIT per  tablet Take 1 tablet by mouth daily.  09/04/11  [provider]  terazosin (HYTRIN) 5 MG capsule Take 5 mg by mouth at bedtime.  09/04/11  [provider]    Family History Family History  Problem Relation Age of Onset  . Diabetes Father   . Diabetes Sister   . Diabetes Brother     Social History Social History   Tobacco Use  . Smoking status: Never Smoker  . Smokeless tobacco: Never Used  Substance Use Topics  . Alcohol use: No    Frequency: Never  . Drug use: No     Allergies   Bee venom; Glucophage [metformin]; Shellfish allergy; Grass extracts [gramineae pollens]; and Tobacco [nicotiana tabacum]   Review of Systems Review of Systems  All other systems reviewed and are negative.    Physical Exam  Updated Vital Signs There were no vitals taken for this visit.  Physical Exam  Constitutional: He appears well-developed and well-nourished. No distress.  HENT:  Head: Atraumatic.  Eyes: Conjunctivae are normal.  Neck: Neck supple.  Cardiovascular: Normal rate and regular rhythm.  Pulmonary/Chest: Effort normal and breath sounds normal.  Abdominal: Soft. Bowel sounds are normal. He exhibits no distension. There is no tenderness.  Genitourinary:  Genitourinary Comments: Chaperone present during exam.  circumcised penis. Erythematous and macerated skin changes involved the penis and scrotum, tender to palpation but no induration/fluctuance or evidence of Fornier's Gangrene.   Neurological: He is alert.  Skin: No rash noted.  Psychiatric: He has a normal mood and affect.  Nursing note and vitals reviewed.    ED Treatments / Results  Labs (all labs ordered are listed, but only abnormal results are displayed) Labs Reviewed  CBC WITH DIFFERENTIAL/PLATELET - Abnormal; Notable for the following components:      Result Value   WBC 13.4 (*)    RBC 3.99 (*)    Hemoglobin 12.4 (*)    HCT 37.2 (*)    Lymphs Abs 6.9 (*)    All other components within  normal limits  BASIC METABOLIC PANEL - Abnormal; Notable for the following components:   Glucose, Bld 226 (*)    BUN 32 (*)    All other components within normal limits  URINALYSIS, ROUTINE W REFLEX MICROSCOPIC - Abnormal; Notable for the following components:   Glucose, UA 150 (*)    Ketones, ur 5 (*)    All other components within normal limits    EKG None  Radiology No results found.  Procedures Procedures (including critical care time)  Medications Ordered in ED Medications  nystatin (MYCOSTATIN/NYSTOP) topical powder ( Topical Given 04/23/18 1518)     Initial Impression / Assessment and Plan / ED Course  I have reviewed the triage vital signs and the nursing notes.  Pertinent labs & imaging results that were available during my care of the patient were reviewed by me and considered in my medical decision making (see chart for details).     BP 138/69   Pulse 83   Temp 98.6 F (37 C) (Oral)   Resp 16   SpO2 98%    Final Clinical Impressions(s) / ED Diagnoses   Final diagnoses:  Candidal balanitis    ED Discharge Orders         Ordered    fluconazole (DIFLUCAN) 200 MG tablet  Daily     04/23/18 1533    nystatin cream (MYCOSTATIN)     04/23/18 1533         1:56 PM Patient here with persistent cellulitis involving his perineal region including penis and scrotum for the past several months.  He was recently admitted to the hospital on 10/19 until 10/22 for his symptoms, treated for cellulitis with clindamycin as well as for Klebsiella pneumonia with Keflex.  He is currently on 2 different antibiotics.  He was sent here because he was complaining of increasing pain to his scrotal region earlier this morning which has since subsided.  His pain is 2 out of 10, he does have syneretic skin changes to his perineum and scrotum but no evidence of Fournier gangrene.  He is well-appearing, no fever, no other systemic complaints.  Will perform screening labs, and likely  anticipate discharge back to facility.  I have reached out and spoke with patient's nurse at his skilled nursing facility to garner more history.  3:43 PM Care discussed with Dr. Ronnald Nian, we both felt his skin rash is likely 2/2 to candidal infection.  THere is improvement of his sxs as compared to previous capture picture of his rash during last admission.  Will d/c with diflucan PO and Nystatin cream.  Recommend hold off on cholesterol medication while on difucan. Encourage pt to f/u with PCP in the next few days for reassessment.  Pt stable for discharge.     [name], PA-C 04/23/18 [place], [place], DO 04/23/18 2206

## 2018-04-23 NOTE — ED Provider Notes (Signed)
Medical screening examination/treatment/procedure(s) were conducted as a shared visit with non-physician practitioner(s) and myself.  I personally evaluated the patient during the encounter. Briefly, the patient is a 77 y.o. male with history of diabetes, enlarged prostate, fibromyalgia who presents to the ED with groin pain.  Patient currently on antibiotics for scrotal and penis cellulitis.  Patient has a history of the same.  He was treated inpatient last week and discharged.  Overall he has had improvement of redness and swelling but continues to have intermittent pain.  Patient does not have access to much pain medication at his nursing home and mostly the concern for visit today, he states infection is improving.  Patient has redness to his penis, scrotal area.  However there is no crepitus, no concern for necrotizing fasciitis or other surgical process.  Appears overall improved from photos from last week with less swelling and improved redness.  Concern for also possible fungal component in addition to bacterial component.  He still has multiple days of clindamycin left.  Patient had improvement of pain with pain medications.  Had mild leukocytosis but otherwise normal vitals.  No fever.  No concern for sepsis.  This appears to be recurrent for the patient.  We will start oral and topical antifungal in addition to continuing clindamycin.  Recommend close follow-up with urology or primary care doctor in the next 24 to 48 hours.  Given return precautions.  Discharged from in ED good condition.   EKG Interpretation None          Lennice Sites, DO 04/23/18 1529

## 2018-04-26 DIAGNOSIS — L03315 Cellulitis of perineum: Secondary | ICD-10-CM | POA: Diagnosis not present

## 2018-04-26 DIAGNOSIS — E11319 Type 2 diabetes mellitus with unspecified diabetic retinopathy without macular edema: Secondary | ICD-10-CM | POA: Diagnosis not present

## 2018-04-26 DIAGNOSIS — F418 Other specified anxiety disorders: Secondary | ICD-10-CM | POA: Diagnosis not present

## 2018-04-26 DIAGNOSIS — Z7689 Persons encountering health services in other specified circumstances: Secondary | ICD-10-CM | POA: Diagnosis not present

## 2018-04-28 DIAGNOSIS — E1142 Type 2 diabetes mellitus with diabetic polyneuropathy: Secondary | ICD-10-CM | POA: Diagnosis not present

## 2018-04-28 DIAGNOSIS — F0390 Unspecified dementia without behavioral disturbance: Secondary | ICD-10-CM | POA: Diagnosis not present

## 2018-04-28 DIAGNOSIS — F418 Other specified anxiety disorders: Secondary | ICD-10-CM | POA: Diagnosis not present

## 2018-04-28 DIAGNOSIS — B961 Klebsiella pneumoniae [K. pneumoniae] as the cause of diseases classified elsewhere: Secondary | ICD-10-CM | POA: Diagnosis not present

## 2018-04-28 DIAGNOSIS — N390 Urinary tract infection, site not specified: Secondary | ICD-10-CM | POA: Diagnosis not present

## 2018-04-28 DIAGNOSIS — C9110 Chronic lymphocytic leukemia of B-cell type not having achieved remission: Secondary | ICD-10-CM | POA: Diagnosis not present

## 2018-04-30 DIAGNOSIS — B961 Klebsiella pneumoniae [K. pneumoniae] as the cause of diseases classified elsewhere: Secondary | ICD-10-CM | POA: Diagnosis not present

## 2018-04-30 DIAGNOSIS — E1142 Type 2 diabetes mellitus with diabetic polyneuropathy: Secondary | ICD-10-CM | POA: Diagnosis not present

## 2018-04-30 DIAGNOSIS — F0390 Unspecified dementia without behavioral disturbance: Secondary | ICD-10-CM | POA: Diagnosis not present

## 2018-04-30 DIAGNOSIS — F418 Other specified anxiety disorders: Secondary | ICD-10-CM | POA: Diagnosis not present

## 2018-04-30 DIAGNOSIS — N390 Urinary tract infection, site not specified: Secondary | ICD-10-CM | POA: Diagnosis not present

## 2018-04-30 DIAGNOSIS — C9110 Chronic lymphocytic leukemia of B-cell type not having achieved remission: Secondary | ICD-10-CM | POA: Diagnosis not present

## 2018-05-03 ENCOUNTER — Encounter: Payer: Self-pay | Admitting: Neurology

## 2018-05-03 ENCOUNTER — Ambulatory Visit (INDEPENDENT_AMBULATORY_CARE_PROVIDER_SITE_OTHER): Payer: Medicare Other | Admitting: Neurology

## 2018-05-03 VITALS — BP 136/70 | HR 87 | Ht 68.5 in | Wt 150.0 lb

## 2018-05-03 DIAGNOSIS — F0390 Unspecified dementia without behavioral disturbance: Secondary | ICD-10-CM

## 2018-05-03 DIAGNOSIS — F03A0 Unspecified dementia, mild, without behavioral disturbance, psychotic disturbance, mood disturbance, and anxiety: Secondary | ICD-10-CM

## 2018-05-03 MED ORDER — DONEPEZIL HCL 10 MG PO TABS: 10.0000 mg | ORAL_TABLET | Freq: Every day | ORAL | 11 refills | Status: DC

## 2018-05-03 MED ORDER — QUETIAPINE FUMARATE 25 MG PO TABS: ORAL_TABLET | ORAL | 11 refills | Status: DC

## 2018-05-03 NOTE — Patient Instructions (Addendum)
1. Restart Donepezil 10mg  daily 2. Increase Seroquel 25mg : Take 1/2 tablet in AM, 1 tablet in PM and see if this helps with behavior 3. Follow-up in 6 months, call for any changes

## 2018-05-03 NOTE — Progress Notes (Signed)
NEUROLOGY FOLLOW UP OFFICE NOTE  Hayden [name] [id number] 24-Oct-1940  HISTORY OF PRESENT ILLNESS: I had the pleasure of seeing Hayden [name] in follow-up in the neurology clinic on 05/03/2018.  The patient was last seen 6 months ago for mild to moderate dementia. He is accompanied by [facility] staff who helps supplement the history today. MMSE in April 2019 was 25/30, he was started on Donepezil 10mg  daily which he is tolerating without side effects. He moved to [facility] 2-3 months ago. He has had several falls, one of them while he was having intimate relations with his wife. They have since been separated, however staff reports inappropriate sexual behavior with staff members, writing them inappropriate letters. They plan to start medication to decrease libido. He is again noted to have some tangential speech and loose associations, when asked about Hayden [name], he states it needs improvement and I will contribute to that, stating they need to work in making better telephone and internet service available. He then speaks about being upset with his niece and taking her to court. He reports his memory is poor, especially short-term memory. Staff administers his medications. He is now on Seroquel 12.5mg  BID without side effects. He is independent with dressing most of the time. Staff reports occasional agitation, no paranoia or hallucinations.   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 He reports his memory is "shot." History is mostly obtained from Epic,patient is a poor historian but able to provide some history, his caregiver has been with him only for the past month. He reports that he stopped driving 1-2 years ago, he does recall getting lost driving a few times. He recalls an episode where he started walking to the bank and a family member  saw him on the street and asked him to ride the car. It is unclear when this occurred. He is quite verbose and sometimes tangential, with loose associations. His son has been in charge of finances for the past 2-3 years, he does note he was forgetting bill payments. He states his son has an "iron grip on my life." He has caregivers staying 16 hours a day with him, fixing meals and checking he takes his medications. He takes his medications by himself, once in a while she helps. He is independent with dressing and bathing. His caregiver feels he may need someone with him at night, she thinks he is up at night. No clear wandering behavior. He has a history of insomnia "I have nothing but hard nights," Trazodone does not help with sleep much. He reports his son "unofficially bumped my dose of Ambien" which is much for effective for him, he will discuss this with Dr. Orland Mustard. He was admitted to [facility] last month for altered mental status. 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 There was also note of increased signal and decreased size in the left posterior hippocampus. He denies any significant head injuries. He denies any seizure-like symptoms, no olfactory/gustatory hallucinations, focal numbness/tingling/weakness. His caregiver denies any staring/unresponsive episodes.   He is occasionally wobbly when he gets up. 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 He denies any neck/back pain. He has urinary incontinence and occasional bowel incontinence. He denies any headaches, diplopia, dysarthria/dysphagia, anosmia, or tremors. His caregiver denies any  hallucinations or significant behavioral issues, he rarely gets cranky when tired/overworked or frustrated due to his memory. No family history of dementia. He denies alcohol intake.   PAST MEDICAL HISTORY: Past Medical History:    Diagnosis Date  . Depression   . Diabetes mellitus   . Enlarged prostate   . Fibromyalgia   . GERD (gastroesophageal reflux disease)    reflux intermittent  . Hyperlipidemia   . 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 aspirin 81 MG tablet Take 81 mg by mouth every morning.     . bacitracin ointment Apply topically 2 (two) times daily. (Patient not taking: Reported on 04/23/2018) 120 g 0  . buPROPion (WELLBUTRIN XL) 300 MG 24 hr tablet Take 300 mg by mouth every morning.     . cetirizine (ZYRTEC) 10 MG tablet Take 10 mg by mouth daily as needed for allergies.     . Cholecalciferol (VITAMIN D3) 1000 UNITS CAPS Take 1 capsule by mouth every morning.     Marland Kitchen DIMETHICONE-ZINC OXIDE EX Apply 1 application topically 2 (two) times daily.    Marland Kitchen docusate sodium (COLACE) 100 MG capsule Take 100 mg by mouth daily.     Marland Kitchen EPINEPHrine (EPIPEN JR) 0.15 MG/0.3ML injection Inject 0.15 mg into the muscle as needed for anaphylaxis.    . feeding supplement, GLUCERNA SHAKE, (GLUCERNA SHAKE) LIQD Take 237 mLs by mouth 3 (three) times daily between meals. 30 Can 0  . fish oil-omega-3 fatty acids 1000 MG capsule Take 1 g by mouth 2 (two) times daily.     . fluconazole (DIFLUCAN) 200 MG tablet Take 1 tablet (200 mg total) by mouth daily. 7 tablet 0  . HYDROcodone-acetaminophen (NORCO/VICODIN) 5-325 MG tablet Take 1 tablet by mouth every 6 (six) hours as needed for moderate pain.    . hydrOXYzine (ATARAX/VISTARIL) 25 MG tablet Take 25 mg by mouth 3 (three) times daily as needed for itching.    Marland Kitchen ibuprofen (ADVIL,MOTRIN) 600 MG tablet Take 1 tablet (600 mg total) by mouth every 6 (six) hours as needed. 30 tablet 0  . insulin lispro (HUMALOG KWIKPEN) 100 UNIT/ML KiwkPen Inject 3 Units into the skin 3 (three) times daily before meals.     Marland Kitchen LEVEMIR FLEXTOUCH 100 UNIT/ML Pen Inject 36  Units into the skin at bedtime.   11  . naproxen sodium (ALEVE) 220 MG tablet Take 220 mg by mouth every 12 (twelve) hours as needed (pain).    Marland Kitchen neomycin-bacitracin-polymyxin (NEOSPORIN) OINT Apply 1 application topically as needed for wound care (left shoulder).    . nystatin cream (MYCOSTATIN) Apply to affected area 2 times daily 30 g 0  . QUEtiapine (SEROQUEL) 25 MG tablet Take 12.5 mg by mouth 2 (two) times daily.    Marland Kitchen saccharomyces boulardii (FLORASTOR) 250 MG capsule Take 1 capsule (250 mg total) by mouth 2 (two) times daily for 14 days. 28 capsule 0  . tamsulosin (FLOMAX) 0.4 MG CAPS capsule Take 0.4 mg by mouth every evening.     . traZODone (DESYREL) 100 MG tablet Take 150 mg by mouth at bedtime.    . vitamin B-12 (CYANOCOBALAMIN) 1000 MCG tablet Take 1,000 mcg by mouth every morning.     . [DISCONTINUED] calcium citrate-vitamin D (CITRACAL+D) 315-200 MG-UNIT per tablet Take 1 tablet by mouth daily.    . [DISCONTINUED] terazosin (HYTRIN) 5 MG capsule Take  5 mg by mouth at bedtime.     No current facility-administered medications on file prior to visit.     ALLERGIES: Allergies  Allergen Reactions  . Bee Venom Other (See Comments)    Bump where stung and took days to go away  . Glucophage [Metformin] Diarrhea    "i will never take it again -stool incontinent "  . Shellfish Allergy Swelling  . Grass Extracts [Gramineae Pollens]   . Tobacco [Nicotiana Tabacum]     FAMILY HISTORY: Family History  Problem Relation Age of Onset  . Diabetes Father   . Diabetes Sister   . Diabetes Brother     SOCIAL HISTORY: Social History   Socioeconomic History  . Marital status: Married    Spouse name: Not on file  . Number of children: Not on file  . Years of education: Not on file  . Highest education level: Not on file  Occupational History  . Not on file  Social Needs  . Financial resource strain: Not on file  . Food insecurity:    Worry: Not on file    Inability: Not on  file  . Transportation needs:    Medical: Not on file    Non-medical: Not on file  Tobacco Use  . Smoking status: Never Smoker  . Smokeless tobacco: Never Used  Substance and Sexual Activity  . Alcohol use: No    Frequency: Never  . Drug use: No  . Sexual activity: Not on file  Lifestyle  . Physical activity:    Days per week: Not on file    Minutes per session: Not on file  . Stress: Not on file  Relationships  . Social connections:    Talks on phone: Not on file    Gets together: Not on file    Attends religious service: Not on file    Active member of club or organization: Not on file    Attends meetings of clubs or organizations: Not on file    Relationship status: Not on file  . Intimate partner violence:    Fear of current or ex partner: Not on file    Emotionally abused: Not on file    Physically abused: Not on file    Forced sexual activity: Not on file  Other Topics Concern  . Not on file  Social History Narrative  . Not on file    REVIEW OF SYSTEMS: Constitutional: No fevers, chills, or sweats, no generalized fatigue, change in appetite Eyes: No visual changes, double vision, eye pain Ear, nose and throat: No hearing loss, ear pain, nasal congestion, sore throat Cardiovascular: No chest pain, palpitations Respiratory:  No shortness of breath at rest or with exertion, wheezes GastrointestinaI: No nausea, vomiting, diarrhea, abdominal pain, fecal incontinence Genitourinary:  No dysuria, urinary retention or frequency Musculoskeletal:  No neck pain, back pain Integumentary: No rash, pruritus, skin lesions Neurological: as above Psychiatric: No depression, insomnia, anxiety Endocrine: No palpitations, fatigue, diaphoresis, mood swings, change in appetite, change in weight, increased thirst Hematologic/Lymphatic:  No anemia, purpura, petechiae. Allergic/Immunologic: no itchy/runny eyes, nasal congestion, recent allergic reactions, rashes  PHYSICAL  EXAM: Vitals:   05/03/18 1541  BP: 136/70  Pulse: 87  SpO2: 96%   General: No acute distress Head:  Normocephalic/atraumatic Neck: supple, no paraspinal tenderness, full range of motion Heart:  Regular rate and rhythm Lungs:  Clear to auscultation bilaterally Back: No paraspinal tenderness Skin/Extremities: No rash, no edema Neurological Exam: alert and oriented to person, place,  and date/month/year. No aphasia or dysarthria. Fund of knowledge is reduced. Recent and remote memory are intact.  Attention and concentration are normal.    Able to name objects and repeat phrases. CDT 5/5 MMSE - Mini Mental State Exam 05/03/2018 10/22/2017  Orientation to time 3 4  Orientation to Place 4 4  Registration 3 3  Attention/ Calculation 5 5  Recall 2 0  Language- name 2 objects 2 2  Language- repeat 1 1  Language- follow 3 step command 3 3  Language- read & follow direction 1 1  Write a sentence 1 1  Copy design 1 1  Total score 26 25   Cranial nerves: Pupils equal, round, reactive to light. Extraocular movements intact with no nystagmus. Visual fields full. Facial sensation intact. No facial asymmetry. Tongue, uvula, palate midline.  Motor: Bulk and tone normal, muscle strength 5/5 throughout with no pronator drift.  Sensation to light intact.  No extinction to double simultaneous stimulation.  Finger to nose testing intact.  Gait slow and cautious with cane, no ataxia.  IMPRESSION: This is a pleasant 77 yo RH man with a history of  hyperlipidemia, diabetes, CLL, with mild to moderate dementia with behavioral disturbance. MMSE today 26/30 (25/30 in April 2019). He is taking Donepezil 10mg  daily without side effects. Staff reports inappropriate sexual behavior, there are several medication options that may help, since he is already on Seroquel, try increasing dose to 12.5mg  in AM, 25mg  in PM. Staff reports plans to potentially start medication to decrease libido. Continue with 24/7 supervision. He  will follow-up in 6 months and knows to call for any changes.   Thank you for allowing me to participate in his care.  Please do not hesitate to call for any questions or concerns.  The duration of this appointment visit was 30 minutes of face-to-face time with the patient.  Greater than 50% of this time was spent in counseling, explanation of diagnosis, planning of further management, and coordination of care.   Ellouise Newer, M.D.   CC: Dr. Orland Mustard

## 2018-05-04 DIAGNOSIS — F0390 Unspecified dementia without behavioral disturbance: Secondary | ICD-10-CM | POA: Diagnosis not present

## 2018-05-04 DIAGNOSIS — N390 Urinary tract infection, site not specified: Secondary | ICD-10-CM | POA: Diagnosis not present

## 2018-05-04 DIAGNOSIS — L03314 Cellulitis of groin: Secondary | ICD-10-CM | POA: Diagnosis not present

## 2018-05-04 DIAGNOSIS — N3941 Urge incontinence: Secondary | ICD-10-CM | POA: Diagnosis not present

## 2018-05-04 DIAGNOSIS — B961 Klebsiella pneumoniae [K. pneumoniae] as the cause of diseases classified elsewhere: Secondary | ICD-10-CM | POA: Diagnosis not present

## 2018-05-04 DIAGNOSIS — F418 Other specified anxiety disorders: Secondary | ICD-10-CM | POA: Diagnosis not present

## 2018-05-04 DIAGNOSIS — E1142 Type 2 diabetes mellitus with diabetic polyneuropathy: Secondary | ICD-10-CM | POA: Diagnosis not present

## 2018-05-04 DIAGNOSIS — C9110 Chronic lymphocytic leukemia of B-cell type not having achieved remission: Secondary | ICD-10-CM | POA: Diagnosis not present

## 2018-05-06 DIAGNOSIS — C9110 Chronic lymphocytic leukemia of B-cell type not having achieved remission: Secondary | ICD-10-CM | POA: Diagnosis not present

## 2018-05-06 DIAGNOSIS — N390 Urinary tract infection, site not specified: Secondary | ICD-10-CM | POA: Diagnosis not present

## 2018-05-06 DIAGNOSIS — F418 Other specified anxiety disorders: Secondary | ICD-10-CM | POA: Diagnosis not present

## 2018-05-06 DIAGNOSIS — B961 Klebsiella pneumoniae [K. pneumoniae] as the cause of diseases classified elsewhere: Secondary | ICD-10-CM | POA: Diagnosis not present

## 2018-05-06 DIAGNOSIS — E1142 Type 2 diabetes mellitus with diabetic polyneuropathy: Secondary | ICD-10-CM | POA: Diagnosis not present

## 2018-05-06 DIAGNOSIS — F0390 Unspecified dementia without behavioral disturbance: Secondary | ICD-10-CM | POA: Diagnosis not present

## 2018-05-10 DIAGNOSIS — E11319 Type 2 diabetes mellitus with unspecified diabetic retinopathy without macular edema: Secondary | ICD-10-CM | POA: Diagnosis not present

## 2018-05-10 DIAGNOSIS — L22 Diaper dermatitis: Secondary | ICD-10-CM | POA: Diagnosis not present

## 2018-05-10 DIAGNOSIS — N492 Inflammatory disorders of scrotum: Secondary | ICD-10-CM | POA: Diagnosis not present

## 2018-05-10 DIAGNOSIS — T3696XA Underdosing of unspecified systemic antibiotic, initial encounter: Secondary | ICD-10-CM | POA: Diagnosis not present

## 2018-05-10 DIAGNOSIS — F418 Other specified anxiety disorders: Secondary | ICD-10-CM | POA: Diagnosis not present

## 2018-05-11 DIAGNOSIS — F0390 Unspecified dementia without behavioral disturbance: Secondary | ICD-10-CM | POA: Diagnosis not present

## 2018-05-11 DIAGNOSIS — F418 Other specified anxiety disorders: Secondary | ICD-10-CM | POA: Diagnosis not present

## 2018-05-11 DIAGNOSIS — C9110 Chronic lymphocytic leukemia of B-cell type not having achieved remission: Secondary | ICD-10-CM | POA: Diagnosis not present

## 2018-05-11 DIAGNOSIS — E1142 Type 2 diabetes mellitus with diabetic polyneuropathy: Secondary | ICD-10-CM | POA: Diagnosis not present

## 2018-05-11 DIAGNOSIS — N390 Urinary tract infection, site not specified: Secondary | ICD-10-CM | POA: Diagnosis not present

## 2018-05-11 DIAGNOSIS — B961 Klebsiella pneumoniae [K. pneumoniae] as the cause of diseases classified elsewhere: Secondary | ICD-10-CM | POA: Diagnosis not present

## 2018-05-13 DIAGNOSIS — F0390 Unspecified dementia without behavioral disturbance: Secondary | ICD-10-CM | POA: Diagnosis not present

## 2018-05-13 DIAGNOSIS — E1142 Type 2 diabetes mellitus with diabetic polyneuropathy: Secondary | ICD-10-CM | POA: Diagnosis not present

## 2018-05-13 DIAGNOSIS — F418 Other specified anxiety disorders: Secondary | ICD-10-CM | POA: Diagnosis not present

## 2018-05-13 DIAGNOSIS — N390 Urinary tract infection, site not specified: Secondary | ICD-10-CM | POA: Diagnosis not present

## 2018-05-13 DIAGNOSIS — B961 Klebsiella pneumoniae [K. pneumoniae] as the cause of diseases classified elsewhere: Secondary | ICD-10-CM | POA: Diagnosis not present

## 2018-05-13 DIAGNOSIS — C9110 Chronic lymphocytic leukemia of B-cell type not having achieved remission: Secondary | ICD-10-CM | POA: Diagnosis not present

## 2018-05-16 ENCOUNTER — Emergency Department (HOSPITAL_COMMUNITY)
Admission: EM | Admit: 2018-05-16 | Discharge: 2018-05-17 | Disposition: A | Discharge status: Home / Self Care | Payer: Medicare Other | Attending: Emergency Medicine | Admitting: Emergency Medicine

## 2018-05-16 DIAGNOSIS — W0110XA Fall on same level from slipping, tripping and stumbling with subsequent striking against unspecified object, initial encounter: Secondary | ICD-10-CM | POA: Insufficient documentation

## 2018-05-16 DIAGNOSIS — Y92129 Unspecified place in nursing home as the place of occurrence of the external cause: Secondary | ICD-10-CM

## 2018-05-16 DIAGNOSIS — E1165 Type 2 diabetes mellitus with hyperglycemia: Secondary | ICD-10-CM | POA: Diagnosis not present

## 2018-05-16 DIAGNOSIS — Y9301 Activity, walking, marching and hiking: Secondary | ICD-10-CM | POA: Insufficient documentation

## 2018-05-16 DIAGNOSIS — Z23 Encounter for immunization: Secondary | ICD-10-CM | POA: Diagnosis not present

## 2018-05-16 DIAGNOSIS — F0390 Unspecified dementia without behavioral disturbance: Secondary | ICD-10-CM | POA: Insufficient documentation

## 2018-05-16 DIAGNOSIS — Z794 Long term (current) use of insulin: Secondary | ICD-10-CM | POA: Insufficient documentation

## 2018-05-16 DIAGNOSIS — S199XXA Unspecified injury of neck, initial encounter: Secondary | ICD-10-CM | POA: Diagnosis not present

## 2018-05-16 DIAGNOSIS — S0101XA Laceration without foreign body of scalp, initial encounter: Secondary | ICD-10-CM

## 2018-05-16 DIAGNOSIS — Z79899 Other long term (current) drug therapy: Secondary | ICD-10-CM | POA: Insufficient documentation

## 2018-05-16 DIAGNOSIS — Y92122 Bedroom in nursing home as the place of occurrence of the external cause: Secondary | ICD-10-CM | POA: Diagnosis not present

## 2018-05-16 DIAGNOSIS — E119 Type 2 diabetes mellitus without complications: Secondary | ICD-10-CM | POA: Diagnosis not present

## 2018-05-16 DIAGNOSIS — R52 Pain, unspecified: Secondary | ICD-10-CM | POA: Diagnosis not present

## 2018-05-16 DIAGNOSIS — Y999 Unspecified external cause status: Secondary | ICD-10-CM | POA: Insufficient documentation

## 2018-05-16 DIAGNOSIS — Z7982 Long term (current) use of aspirin: Secondary | ICD-10-CM | POA: Insufficient documentation

## 2018-05-16 DIAGNOSIS — S0990XA Unspecified injury of head, initial encounter: Secondary | ICD-10-CM | POA: Diagnosis present

## 2018-05-16 DIAGNOSIS — W19XXXA Unspecified fall, initial encounter: Secondary | ICD-10-CM

## 2018-05-16 NOTE — ED Notes (Signed)
Bed: XN17 Expected date:  Expected time:  Means of arrival:  Comments: 77 yo M/fall

## 2018-05-17 ENCOUNTER — Encounter (HOSPITAL_COMMUNITY): Payer: Self-pay | Admitting: Emergency Medicine

## 2018-05-17 ENCOUNTER — Emergency Department (HOSPITAL_COMMUNITY): Payer: Medicare Other

## 2018-05-17 ENCOUNTER — Other Ambulatory Visit: Payer: Self-pay

## 2018-05-17 DIAGNOSIS — N492 Inflammatory disorders of scrotum: Secondary | ICD-10-CM | POA: Diagnosis not present

## 2018-05-17 DIAGNOSIS — E11319 Type 2 diabetes mellitus with unspecified diabetic retinopathy without macular edema: Secondary | ICD-10-CM | POA: Diagnosis not present

## 2018-05-17 DIAGNOSIS — M2550 Pain in unspecified joint: Secondary | ICD-10-CM | POA: Diagnosis not present

## 2018-05-17 DIAGNOSIS — B961 Klebsiella pneumoniae [K. pneumoniae] as the cause of diseases classified elsewhere: Secondary | ICD-10-CM | POA: Diagnosis not present

## 2018-05-17 DIAGNOSIS — R278 Other lack of coordination: Secondary | ICD-10-CM | POA: Diagnosis not present

## 2018-05-17 DIAGNOSIS — F418 Other specified anxiety disorders: Secondary | ICD-10-CM | POA: Diagnosis not present

## 2018-05-17 DIAGNOSIS — S199XXA Unspecified injury of neck, initial encounter: Secondary | ICD-10-CM | POA: Diagnosis not present

## 2018-05-17 DIAGNOSIS — R5381 Other malaise: Secondary | ICD-10-CM | POA: Diagnosis not present

## 2018-05-17 DIAGNOSIS — R531 Weakness: Secondary | ICD-10-CM | POA: Diagnosis not present

## 2018-05-17 DIAGNOSIS — C9110 Chronic lymphocytic leukemia of B-cell type not having achieved remission: Secondary | ICD-10-CM | POA: Diagnosis not present

## 2018-05-17 DIAGNOSIS — S0181XA Laceration without foreign body of other part of head, initial encounter: Secondary | ICD-10-CM | POA: Diagnosis not present

## 2018-05-17 DIAGNOSIS — L22 Diaper dermatitis: Secondary | ICD-10-CM | POA: Diagnosis not present

## 2018-05-17 DIAGNOSIS — E1142 Type 2 diabetes mellitus with diabetic polyneuropathy: Secondary | ICD-10-CM | POA: Diagnosis not present

## 2018-05-17 DIAGNOSIS — N390 Urinary tract infection, site not specified: Secondary | ICD-10-CM | POA: Diagnosis not present

## 2018-05-17 DIAGNOSIS — S0101XA Laceration without foreign body of scalp, initial encounter: Secondary | ICD-10-CM | POA: Diagnosis not present

## 2018-05-17 DIAGNOSIS — F0390 Unspecified dementia without behavioral disturbance: Secondary | ICD-10-CM | POA: Diagnosis not present

## 2018-05-17 DIAGNOSIS — W1830XA Fall on same level, unspecified, initial encounter: Secondary | ICD-10-CM | POA: Diagnosis not present

## 2018-05-17 MED ORDER — TETANUS-DIPHTH-ACELL PERTUSSIS 5-2.5-18.5 LF-MCG/0.5 IM SUSP
0.5000 mL | Freq: Once | INTRAMUSCULAR | Status: AC
  Administered 2018-05-17: 0.5 mL via INTRAMUSCULAR
  Filled 2018-05-17: qty 0.5

## 2018-05-17 NOTE — ED Triage Notes (Signed)
Pt presents from [facility] by [facility] for fall. EMS reports that pt tripped and fell when walking into dark room. No LOC. 4.25cm laceration to top of head. No active bleeding at this time.

## 2018-05-17 NOTE — ED Provider Notes (Signed)
[facility] DEPT Provider Note   CSN: [id number] Arrival date & time: 05/16/18  2356     History   Chief Complaint Chief Complaint  Patient presents with  . Fall  . Laceration    HPI Hayden [name] is a 77 y.o. male.  The history is provided by the patient.  He has a diagnosis of diabetes, hyperlipidemia, chronic lymphocytic leukemia and comes in following a fall at the nursing home where he resides.  He states that he walked into his room and shut the door and turn the light off.  He immediately got disoriented and fell.  He suffered a laceration to his scalp.  He is not sure where he cut his hat on.  He does not know when his last tetanus immunization was.  He denies loss of consciousness.  Past Medical History:  Diagnosis Date  . Depression   . Diabetes mellitus   . Enlarged prostate   . Fibromyalgia   . GERD (gastroesophageal reflux disease)    reflux intermittent  . Hyperlipidemia   . Leukemia [facility]) chronic lymphocytic   2008 diagnosed-monitored Dr     Patient Active Problem List   Diagnosis Date Noted  . Cellulitis 04/17/2018  . Cellulitis, scrotum 03/09/2018  . Cellulitis of male genitalia 03/08/2018  . Depression 03/08/2018  . BPH (benign prostatic hyperplasia) 03/08/2018  . Leukocytosis 03/08/2018  . Mild dementia ([place]) 10/23/2017  . UTI (urinary tract infection) 09/06/2017  . Hyperglycemia 09/06/2017  . ARF (acute renal failure) ([place]) 09/06/2017  . Altered mental status 09/06/2017  . Type 2 diabetes mellitus with complication ([place]) 40/98/1191  . CLL (chronic lymphocytic leukemia) ([place]) 09/01/2013    Past Surgical History:  Procedure Laterality Date  . ADENOIDECTOMY    . CARPAL TUNNEL RELEASE     rt hand  . CATARACT EXTRACTION, BILATERAL    . CERVICAL FUSION  1998  . CYSTOSCOPY  07/26/2012   Procedure: CYSTOSCOPY;  Surgeon: Ailene Rud, MD;  Location: [facility];  Service: Urology;   Laterality: N/A;  DIAGNOSTIC CYSTO PROSTATE ULTRASOUND      . PROSTATE BIOPSY  07/26/2012   Procedure: PROSTATE BIOPSY;  Surgeon: Ailene Rud, MD;  Location: [facility];  Service: Urology;  Laterality: N/A;  . retinal micro aneurysms    . tonsil    . VASECTOMY    . VITRECTOMY          Home Medications    Prior to Admission medications   Medication Sig Start Date End Date Taking? Authorizing Provider  aspirin 81 MG tablet Take 81 mg by mouth every morning.     [provider]  bacitracin ointment Apply topically 2 (two) times daily. Patient not taking: Reported on 04/23/2018 03/11/18   [name], DO  buPROPion (WELLBUTRIN XL) 300 MG 24 hr tablet Take 300 mg by mouth every morning.     [provider]  cetirizine (ZYRTEC) 10 MG tablet Take 10 mg by mouth daily as needed for allergies.     [provider]  Cholecalciferol (VITAMIN D3) 1000 UNITS CAPS Take 1 capsule by mouth every morning.     [provider]  DIMETHICONE-ZINC OXIDE EX Apply 1 application topically 2 (two) times daily.    [provider]  docusate sodium (COLACE) 100 MG capsule Take 100 mg by mouth daily.     [provider]  donepezil (ARICEPT) 10 MG tablet Take 1 tablet (10 mg total)  by mouth at bedtime. 05/03/18   Cameron Sprang, MD  EPINEPHrine (EPIPEN JR) 0.15 MG/0.3ML injection Inject 0.15 mg into the muscle as needed for anaphylaxis.    [provider]  feeding supplement, GLUCERNA SHAKE, (GLUCERNA SHAKE) LIQD Take 237 mLs by mouth 3 (three) times daily between meals. 03/11/18   [name], [name], DO  fish oil-omega-3 fatty acids 1000 MG capsule Take 1 g by mouth 2 (two) times daily.     [provider]  fluconazole (DIFLUCAN) 200 MG tablet Take 1 tablet (200 mg total) by mouth daily. 04/23/18   [name], PA-C  HYDROcodone-acetaminophen (NORCO/VICODIN) 5-325 MG tablet Take 1 tablet by mouth every 6 (six)  hours as needed for moderate pain.    [provider]  hydrOXYzine (ATARAX/VISTARIL) 25 MG tablet Take 25 mg by mouth 3 (three) times daily as needed for itching.    [provider]  ibuprofen (ADVIL,MOTRIN) 600 MG tablet Take 1 tablet (600 mg total) by mouth every 6 (six) hours as needed. 04/23/18   [name], PA-C  insulin lispro (HUMALOG KWIKPEN) 100 UNIT/ML KiwkPen Inject 3 Units into the skin 3 (three) times daily before meals.     [provider]  LEVEMIR FLEXTOUCH 100 UNIT/ML Pen Inject 36 Units into the skin at bedtime.  07/16/15   [provider]  naproxen sodium (ALEVE) 220 MG tablet Take 220 mg by mouth every 12 (twelve) hours as needed (pain).    [provider]  neomycin-bacitracin-polymyxin (NEOSPORIN) OINT Apply 1 application topically as needed for wound care (left shoulder).    [provider]  nystatin cream (MYCOSTATIN) Apply to affected area 2 times daily 04/23/18   [name], PA-C  QUEtiapine (SEROQUEL) 25 MG tablet Take 1/2 tablet in AM, 1 tablet in PM 05/03/18   Cameron Sprang, MD  tamsulosin (FLOMAX) 0.4 MG CAPS capsule Take 0.4 mg by mouth every evening.     [provider]  traZODone (DESYREL) 100 MG tablet Take 150 mg by mouth at bedtime.    [provider]  vitamin B-12 (CYANOCOBALAMIN) 1000 MCG tablet Take 1,000 mcg by mouth every morning.     [provider]  calcium citrate-vitamin D (CITRACAL+D) 315-200 MG-UNIT per tablet Take 1 tablet by mouth daily.  09/04/11  [provider]  terazosin (HYTRIN) 5 MG capsule Take 5 mg by mouth at bedtime.  09/04/11  [provider]    Family History Family History  Problem Relation Age of Onset  . Diabetes Father   . Diabetes Sister   . Diabetes Brother     Social History Social History   Tobacco Use  . Smoking status: Never Smoker  . Smokeless tobacco: Never Used  Substance Use Topics  . Alcohol use: No    Frequency:  Never  . Drug use: No     Allergies   Bee venom; Glucophage [metformin]; Shellfish allergy; Grass extracts [gramineae pollens]; and Tobacco [nicotiana tabacum]   Review of Systems Review of Systems  All other systems reviewed and are negative.    Physical Exam Updated Vital Signs BP 127/63 (BP Location: Left Arm)   Pulse 80   Temp 98.4 F (36.9 C) (Oral)   Resp 17   Ht 5' 8.5" (1.74 m)   Wt 61.2 kg   SpO2 100%   BMI 20.23 kg/m   Physical Exam  Nursing note and vitals reviewed.  77 year old male, resting comfortably and in no acute distress. Vital signs  are normal. Oxygen saturation is 100%, which is normal. Head is normocephalic.  4.5 cm laceration present on the right frontal region. PERRLA, EOMI. Oropharynx is clear. Neck is nontender without adenopathy or JVD. Back is nontender and there is no CVA tenderness. Lungs are clear without rales, wheezes, or rhonchi. Chest is nontender. Heart has regular rate and rhythm without murmur. Abdomen is soft, flat, nontender without masses or hepatosplenomegaly and peristalsis is normoactive. Extremities have no cyanosis or edema, full range of motion is present. Skin is warm and dry without rash. Neurologic: Mental status is normal, cranial nerves are intact, there are no motor or sensory deficits.  ED Treatments / Results    Radiology No results found.  Procedures .Marland KitchenLaceration Repair Date/Time: 05/17/2018 2:49 AM Performed by: Delora Fuel, MD Authorized by: Delora Fuel, MD   Consent:    Consent obtained:  Verbal   Consent given by:  Patient   Risks discussed:  Infection, pain and poor cosmetic result   Alternatives discussed:  No treatment Anesthesia (see MAR for exact dosages):    Anesthesia method:  None Laceration details:    Location:  Scalp   Scalp location:  Frontal   Length (cm):  4   Depth (mm):  3 Repair type:    Repair type:  Simple Pre-procedure details:    Preparation:  Patient was prepped  and draped in usual sterile fashion and imaging obtained to evaluate for foreign bodies Exploration:    Hemostasis achieved with:  Direct pressure   Wound exploration: entire depth of wound probed and visualized   Treatment:    Area cleansed with:  Shur-Clens   Amount of cleaning:  Standard Skin repair:    Repair method:  Staples   Number of staples:  7 Approximation:    Approximation:  Close Post-procedure details:    Dressing:  Open (no dressing)   Patient tolerance of procedure:  Tolerated well, no immediate complications    Medications Ordered in ED Medications  Tdap (BOOSTRIX) injection 0.5 mL (0.5 mLs Intramuscular Given 05/17/18 0103)     Initial Impression / Assessment and Plan / ED Course  I have reviewed the triage vital signs and the nursing notes.  Pertinent imaging results that were available during my care of the patient were reviewed by me and considered in my medical decision making (see chart for details).  Fall with scalp laceration. Tdap booster is given.  He is sent for CT of head and cervical spine.  Laceration is closed with staples.  CT scan showed no acute injury.  He is discharged to return to his nursing care facility, advised staple removal in 7-10 days.  Final Clinical Impressions(s) / ED Diagnoses   Final diagnoses:  Fall at nursing home, initial encounter  Scalp laceration, initial encounter    ED Discharge Orders    None       Delora Fuel, MD 26/71/24 [phone]

## 2018-05-20 DIAGNOSIS — F418 Other specified anxiety disorders: Secondary | ICD-10-CM | POA: Diagnosis not present

## 2018-05-20 DIAGNOSIS — E1142 Type 2 diabetes mellitus with diabetic polyneuropathy: Secondary | ICD-10-CM | POA: Diagnosis not present

## 2018-05-20 DIAGNOSIS — N390 Urinary tract infection, site not specified: Secondary | ICD-10-CM | POA: Diagnosis not present

## 2018-05-20 DIAGNOSIS — B961 Klebsiella pneumoniae [K. pneumoniae] as the cause of diseases classified elsewhere: Secondary | ICD-10-CM | POA: Diagnosis not present

## 2018-05-20 DIAGNOSIS — F0390 Unspecified dementia without behavioral disturbance: Secondary | ICD-10-CM | POA: Diagnosis not present

## 2018-05-20 DIAGNOSIS — C9110 Chronic lymphocytic leukemia of B-cell type not having achieved remission: Secondary | ICD-10-CM | POA: Diagnosis not present

## 2018-05-22 ENCOUNTER — Encounter (HOSPITAL_COMMUNITY): Payer: Self-pay | Admitting: Emergency Medicine

## 2018-05-22 ENCOUNTER — Emergency Department (HOSPITAL_COMMUNITY): Payer: Medicare Other

## 2018-05-22 ENCOUNTER — Inpatient Hospital Stay (HOSPITAL_COMMUNITY)
Admission: EM | Admit: 2018-05-22 | Discharge: 2018-05-25 | DRG: 690 | Disposition: A | Discharge status: Nursing Home (Includes ICF) | Payer: Medicare Other | Attending: Internal Medicine | Admitting: Internal Medicine

## 2018-05-22 ENCOUNTER — Other Ambulatory Visit: Payer: Self-pay

## 2018-05-22 DIAGNOSIS — Z7982 Long term (current) use of aspirin: Secondary | ICD-10-CM

## 2018-05-22 DIAGNOSIS — E119 Type 2 diabetes mellitus without complications: Secondary | ICD-10-CM | POA: Diagnosis present

## 2018-05-22 DIAGNOSIS — Z91013 Allergy to seafood: Secondary | ICD-10-CM

## 2018-05-22 DIAGNOSIS — N390 Urinary tract infection, site not specified: Principal | ICD-10-CM | POA: Diagnosis present

## 2018-05-22 DIAGNOSIS — R0602 Shortness of breath: Secondary | ICD-10-CM

## 2018-05-22 DIAGNOSIS — C9111 Chronic lymphocytic leukemia of B-cell type in remission: Secondary | ICD-10-CM | POA: Diagnosis present

## 2018-05-22 DIAGNOSIS — D72829 Elevated white blood cell count, unspecified: Secondary | ICD-10-CM | POA: Diagnosis present

## 2018-05-22 DIAGNOSIS — F0391 Unspecified dementia with behavioral disturbance: Secondary | ICD-10-CM | POA: Diagnosis not present

## 2018-05-22 DIAGNOSIS — M797 Fibromyalgia: Secondary | ICD-10-CM | POA: Diagnosis present

## 2018-05-22 DIAGNOSIS — N400 Enlarged prostate without lower urinary tract symptoms: Secondary | ICD-10-CM | POA: Diagnosis present

## 2018-05-22 DIAGNOSIS — E118 Type 2 diabetes mellitus with unspecified complications: Secondary | ICD-10-CM | POA: Diagnosis present

## 2018-05-22 DIAGNOSIS — Z87440 Personal history of urinary (tract) infections: Secondary | ICD-10-CM

## 2018-05-22 DIAGNOSIS — R32 Unspecified urinary incontinence: Secondary | ICD-10-CM | POA: Diagnosis present

## 2018-05-22 DIAGNOSIS — B961 Klebsiella pneumoniae [K. pneumoniae] as the cause of diseases classified elsewhere: Secondary | ICD-10-CM | POA: Diagnosis present

## 2018-05-22 DIAGNOSIS — Z91030 Bee allergy status: Secondary | ICD-10-CM

## 2018-05-22 DIAGNOSIS — C9110 Chronic lymphocytic leukemia of B-cell type not having achieved remission: Secondary | ICD-10-CM | POA: Diagnosis present

## 2018-05-22 DIAGNOSIS — F329 Major depressive disorder, single episode, unspecified: Secondary | ICD-10-CM | POA: Diagnosis not present

## 2018-05-22 DIAGNOSIS — Z9119 Patient's noncompliance with other medical treatment and regimen: Secondary | ICD-10-CM

## 2018-05-22 DIAGNOSIS — L89301 Pressure ulcer of unspecified buttock, stage 1: Secondary | ICD-10-CM | POA: Diagnosis present

## 2018-05-22 DIAGNOSIS — L8990 Pressure ulcer of unspecified site, unspecified stage: Secondary | ICD-10-CM

## 2018-05-22 DIAGNOSIS — F32A Depression, unspecified: Secondary | ICD-10-CM | POA: Diagnosis present

## 2018-05-22 DIAGNOSIS — Z794 Long term (current) use of insulin: Secondary | ICD-10-CM

## 2018-05-22 DIAGNOSIS — R4182 Altered mental status, unspecified: Secondary | ICD-10-CM | POA: Diagnosis present

## 2018-05-22 DIAGNOSIS — E785 Hyperlipidemia, unspecified: Secondary | ICD-10-CM | POA: Diagnosis present

## 2018-05-22 DIAGNOSIS — W19XXXA Unspecified fall, initial encounter: Secondary | ICD-10-CM | POA: Diagnosis not present

## 2018-05-22 DIAGNOSIS — F0390 Unspecified dementia without behavioral disturbance: Secondary | ICD-10-CM | POA: Diagnosis present

## 2018-05-22 DIAGNOSIS — Z79899 Other long term (current) drug therapy: Secondary | ICD-10-CM

## 2018-05-22 DIAGNOSIS — S299XXA Unspecified injury of thorax, initial encounter: Secondary | ICD-10-CM | POA: Diagnosis not present

## 2018-05-22 DIAGNOSIS — R5381 Other malaise: Secondary | ICD-10-CM | POA: Diagnosis not present

## 2018-05-22 DIAGNOSIS — F29 Unspecified psychosis not due to a substance or known physiological condition: Secondary | ICD-10-CM | POA: Diagnosis not present

## 2018-05-22 DIAGNOSIS — R296 Repeated falls: Secondary | ICD-10-CM | POA: Diagnosis present

## 2018-05-22 DIAGNOSIS — K219 Gastro-esophageal reflux disease without esophagitis: Secondary | ICD-10-CM | POA: Diagnosis present

## 2018-05-22 DIAGNOSIS — Z833 Family history of diabetes mellitus: Secondary | ICD-10-CM

## 2018-05-22 LAB — URINALYSIS, ROUTINE W REFLEX MICROSCOPIC
Bilirubin Urine: NEGATIVE
GLUCOSE, UA: NEGATIVE mg/dL
Ketones, ur: 5 mg/dL — AB
Nitrite: NEGATIVE
PROTEIN: NEGATIVE mg/dL
Specific Gravity, Urine: 1.014 (ref 1.005–1.030)
WBC, UA: >50 WBC/hpf — ABNORMAL HIGH (ref 0–5)
pH: 8 (ref 5.0–8.0)

## 2018-05-22 LAB — CBC WITH DIFFERENTIAL/PLATELET
ABS IMMATURE GRANULOCYTES: 0.05 10*3/uL (ref 0.00–0.07)
BASOS PCT: 0 %
Basophils Absolute: 0.1 10*3/uL (ref 0.0–0.1)
Eosinophils Absolute: 0.2 10*3/uL (ref 0.0–0.5)
Eosinophils Relative: 1 %
HEMATOCRIT: 41.3 % (ref 39.0–52.0)
Hemoglobin: 13.6 g/dL (ref 13.0–17.0)
Immature Granulocytes: 0 %
Lymphocytes Relative: 59 %
Lymphs Abs: 8.7 10*3/uL — ABNORMAL HIGH (ref 0.7–4.0)
MCH: 31.3 pg (ref 26.0–34.0)
MCHC: 32.9 g/dL (ref 30.0–36.0)
MCV: 94.9 fL (ref 80.0–100.0)
Monocytes Absolute: 0.7 10*3/uL (ref 0.1–1.0)
Monocytes Relative: 5 %
NEUTROS ABS: 5.1 10*3/uL (ref 1.7–7.7)
Neutrophils Relative %: 35 %
Platelets: 201 10*3/uL (ref 150–400)
RBC: 4.35 MIL/uL (ref 4.22–5.81)
RDW: 13.1 % (ref 11.5–15.5)
WBC: 14.8 10*3/uL — ABNORMAL HIGH (ref 4.0–10.5)
nRBC: 0.1 % (ref 0.0–0.2)

## 2018-05-22 LAB — COMPREHENSIVE METABOLIC PANEL
ALBUMIN: 4.2 g/dL (ref 3.5–5.0)
ALT: 37 U/L (ref 0–44)
AST: 30 U/L (ref 15–41)
Alkaline Phosphatase: 57 U/L (ref 38–126)
Anion gap: 10 (ref 5–15)
BUN: 26 mg/dL — AB (ref 8–23)
CO2: 26 mmol/L (ref 22–32)
Calcium: 9.6 mg/dL (ref 8.9–10.3)
Chloride: 104 mmol/L (ref 98–111)
Creatinine, Ser: 0.82 mg/dL (ref 0.61–1.24)
GFR calc Af Amer: >60 mL/min (ref >60)
GLUCOSE: 83 mg/dL (ref 70–99)
Potassium: 4.1 mmol/L (ref 3.5–5.1)
SODIUM: 140 mmol/L (ref 135–145)
Total Bilirubin: 1.1 mg/dL (ref 0.3–1.2)
Total Protein: 6.8 g/dL (ref 6.5–8.1)

## 2018-05-22 LAB — GLUCOSE, CAPILLARY: GLUCOSE-CAPILLARY: 217 mg/dL — AB (ref 70–99)

## 2018-05-22 MED ORDER — TRAZODONE HCL 50 MG PO TABS
150.0000 mg | ORAL_TABLET | Freq: Every day | ORAL | Status: DC
  Administered 2018-05-22 – 2018-05-25 (×3): 150 mg via ORAL
  Filled 2018-05-22 (×3): qty 1

## 2018-05-22 MED ORDER — ENOXAPARIN SODIUM 40 MG/0.4ML ~~LOC~~ SOLN
40.0000 mg | SUBCUTANEOUS | Status: DC
  Administered 2018-05-22 – 2018-05-24 (×3): 40 mg via SUBCUTANEOUS
  Filled 2018-05-22 (×3): qty 0.4

## 2018-05-22 MED ORDER — INSULIN ASPART 100 UNIT/ML ~~LOC~~ SOLN
0.0000 [IU] | Freq: Three times a day (TID) | SUBCUTANEOUS | Status: DC
  Administered 2018-05-23: 2 [IU] via SUBCUTANEOUS
  Administered 2018-05-23 – 2018-05-24 (×3): 3 [IU] via SUBCUTANEOUS
  Administered 2018-05-24 – 2018-05-25 (×2): 2 [IU] via SUBCUTANEOUS

## 2018-05-22 MED ORDER — SODIUM CHLORIDE 0.9 % IV SOLN
INTRAVENOUS | Status: DC
  Administered 2018-05-22: 20 mL/h via INTRAVENOUS

## 2018-05-22 MED ORDER — ASPIRIN EC 81 MG PO TBEC
81.0000 mg | DELAYED_RELEASE_TABLET | ORAL | Status: DC
  Administered 2018-05-23 – 2018-05-25 (×3): 81 mg via ORAL
  Filled 2018-05-22 (×3): qty 1

## 2018-05-22 MED ORDER — QUETIAPINE FUMARATE 25 MG PO TABS
25.0000 mg | ORAL_TABLET | Freq: Every day | ORAL | Status: DC
  Administered 2018-05-22 – 2018-05-24 (×3): 25 mg via ORAL
  Filled 2018-05-22 (×3): qty 1

## 2018-05-22 MED ORDER — PRAVASTATIN SODIUM 20 MG PO TABS
20.0000 mg | ORAL_TABLET | Freq: Every day | ORAL | Status: DC
  Administered 2018-05-23 – 2018-05-24 (×2): 20 mg via ORAL
  Filled 2018-05-22 (×2): qty 1

## 2018-05-22 MED ORDER — HYDROCODONE-ACETAMINOPHEN 5-325 MG PO TABS
1.0000 | ORAL_TABLET | Freq: Four times a day (QID) | ORAL | Status: DC | PRN
  Administered 2018-05-25: 1 via ORAL
  Filled 2018-05-22: qty 1

## 2018-05-22 MED ORDER — INSULIN ASPART 100 UNIT/ML ~~LOC~~ SOLN
0.0000 [IU] | Freq: Every day | SUBCUTANEOUS | Status: DC
  Administered 2018-05-22: 2 [IU] via SUBCUTANEOUS
  Administered 2018-05-23: 3 [IU] via SUBCUTANEOUS
  Administered 2018-05-24: 2 [IU] via SUBCUTANEOUS

## 2018-05-22 MED ORDER — SODIUM CHLORIDE 0.9 % IV SOLN
1.0000 g | INTRAVENOUS | Status: DC
  Administered 2018-05-22: 1 g via INTRAVENOUS
  Filled 2018-05-22: qty 10

## 2018-05-22 MED ORDER — SODIUM CHLORIDE 0.9 % IV SOLN
1.0000 g | INTRAVENOUS | Status: DC
  Administered 2018-05-23 – 2018-05-24 (×2): 1 g via INTRAVENOUS
  Filled 2018-05-22: qty 1
  Filled 2018-05-22: qty 10
  Filled 2018-05-22: qty 1

## 2018-05-22 MED ORDER — DONEPEZIL HCL 5 MG PO TABS
10.0000 mg | ORAL_TABLET | Freq: Every day | ORAL | Status: DC
  Administered 2018-05-22 – 2018-05-24 (×3): 10 mg via ORAL
  Filled 2018-05-22 (×3): qty 2

## 2018-05-22 MED ORDER — BUPROPION HCL ER (XL) 150 MG PO TB24
300.0000 mg | ORAL_TABLET | ORAL | Status: DC
  Administered 2018-05-23 – 2018-05-25 (×3): 300 mg via ORAL
  Filled 2018-05-22 (×3): qty 2

## 2018-05-22 MED ORDER — TAMSULOSIN HCL 0.4 MG PO CAPS
0.4000 mg | ORAL_CAPSULE | Freq: Every evening | ORAL | Status: DC
  Administered 2018-05-22 – 2018-05-24 (×3): 0.4 mg via ORAL
  Filled 2018-05-22 (×3): qty 1

## 2018-05-22 MED ORDER — SODIUM CHLORIDE 0.9 % IV SOLN
INTRAVENOUS | Status: DC
  Administered 2018-05-22 – 2018-05-24 (×4): via INTRAVENOUS

## 2018-05-22 MED ORDER — ZINC OXIDE 20 % EX OINT
TOPICAL_OINTMENT | CUTANEOUS | Status: DC | PRN
  Filled 2018-05-22: qty 28.35

## 2018-05-22 NOTE — ED Notes (Signed)
Pt's sister called wanting information about patient. Informed her that patient is here, stable and being treated, but unfortunately due to HIPPA cant give her anymore information. Sister states that she had signed consent with [facility]. I informed her that unfortunately over the phone I have no way of verifying who she is.  States that she wants staff to be aware that "he will have low blood sugar in the mornings and wont let staff check his sugar or give him insulin, please make sure that this is checked."  Assured sister that I would let the EDP aware.  Asked sister if she would like to speak with patient. Call transferred to patient's room.

## 2018-05-22 NOTE — ED Triage Notes (Signed)
Per [facility] pt from [facility] for aggressive behavior and refusing to take his medications. Denies SI or HI. Hx dementia. Pt was seen recently for fall and still has staples in head. Pt very talkative at this time.

## 2018-05-22 NOTE — ED Notes (Signed)
Stuck patient again for blood draw, unable to get enough for CBC.

## 2018-05-22 NOTE — ED Notes (Signed)
ED TO INPATIENT HANDOFF REPORT  Name/Age/Gender Hayden [name] 77 y.o. male  Code Status    Code Status Orders  (From admission, onward)         Start     Ordered   05/22/18 1635  Full code  Continuous     05/22/18 1635        Code Status History    Date Active Date Inactive Code Status Order ID Comments User Context   04/17/2018 1924 04/20/2018 1822 DNR [id number]  [name], MD Inpatient   03/08/2018 1941 03/11/2018 2105 Full Code [id number]  Norval Morton, MD ED   09/06/2017 2211 09/10/2017 2151 Full Code [id number]  Jani Gravel, MD ED      Home/SNF/Other Nursing Home  Chief Complaint behavioral issues  Level of Care/Admitting Diagnosis ED Disposition    ED Disposition Condition [place]: [facility] [[id number]]  Level of Care: Med-Surg [16]  Diagnosis: UTI (urinary tract infection) [485462]  Admitting Physician: Shelly Coss [[id number]]  Attending Physician: Shelly Coss [[id number]]  PT Class (Do Not Modify): Observation [104]  PT Acc Code (Do Not Modify): Observation [10022]       Medical History Past Medical History:  Diagnosis Date  . Depression   . Diabetes mellitus   . Enlarged prostate   . Fibromyalgia   . GERD (gastroesophageal reflux disease)    reflux intermittent  . Hyperlipidemia   . 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 Bee Venom Other (See Comments)    Bump where stung and took days to go away  . Glucophage [Metformin] Diarrhea    "i will never take it again -stool incontinent "  . Shellfish Allergy Swelling  . Grass Extracts [Gramineae Pollens]   . Tobacco [Nicotiana Tabacum]     IV Location/Drains/Wounds Patient Lines/Drains/Airways Status   Active Line/Drains/Airways    Name:   Placement date:   Placement time:   Site:   Days:   Peripheral IV 05/22/18 Right Forearm   05/22/18    1307    Forearm   less than  1   Wound / Incision (Open or Dehisced) 03/08/18 Other (Comment) Penis Left;Lateral small red sore    03/08/18    2100    Penis   75          Labs/Imaging Results for orders placed or performed during the hospital encounter of 05/22/18 (from the past 48 hour(s))  Urinalysis, Routine w reflex microscopic     Status: Abnormal   Collection Time: 05/22/18 12:04 PM  Result Value Ref Range   Color, Urine YELLOW YELLOW   APPearance HAZY (A) CLEAR   Specific Gravity, Urine 1.014 1.005 - 1.030   pH 8.0 5.0 - 8.0   Glucose, UA NEGATIVE NEGATIVE mg/dL   Hgb urine dipstick SMALL (A) NEGATIVE   Bilirubin Urine NEGATIVE NEGATIVE   Ketones, ur 5 (A) NEGATIVE mg/dL   Protein, ur NEGATIVE NEGATIVE mg/dL   Nitrite NEGATIVE NEGATIVE   Leukocytes, UA MODERATE (A) NEGATIVE   RBC / HPF 0-5 0 - 5 RBC/hpf   WBC, UA >50 (H) 0 - 5 WBC/hpf   Bacteria, UA FEW (A) NONE SEEN   Mucus PRESENT    Budding Yeast PRESENT     Comment: Performed at [facility], [place] [address], [place], [place] [address]  Comprehensive metabolic panel     Status:  Abnormal   Collection Time: 05/22/18  1:09 PM  Result Value Ref Range   Sodium 140 135 - 145 mmol/L   Potassium 4.1 3.5 - 5.1 mmol/L   Chloride 104 98 - 111 mmol/L   CO2 26 22 - 32 mmol/L   Glucose, Bld 83 70 - 99 mg/dL   BUN 26 (H) 8 - 23 mg/dL   Creatinine, Ser 0.82 0.61 - 1.24 mg/dL   Calcium 9.6 8.9 - 10.3 mg/dL   Total Protein 6.8 6.5 - 8.1 g/dL   Albumin 4.2 3.5 - 5.0 g/dL   AST 30 15 - 41 U/L   ALT 37 0 - 44 U/L   Alkaline Phosphatase 57 38 - 126 U/L   Total Bilirubin 1.1 0.3 - 1.2 mg/dL   GFR calc non Af Amer >60 >60 mL/min   GFR calc Af Amer >60 >60 mL/min    Comment: (NOTE) The eGFR has been calculated using the CKD EPI equation. This calculation has not been validated in all clinical situations. eGFR's persistently <60 mL/min signify possible Chronic Kidney Disease.    Anion gap 10 5 - 15    Comment: Performed at [facility], [place] [address], [place], [place] [address]  CBC with Differential/Platelet     Status: Abnormal   Collection Time: 05/22/18  3:32 PM  Result Value Ref Range   WBC 14.8 (H) 4.0 - 10.5 K/uL   RBC 4.35 4.22 - 5.81 MIL/uL   Hemoglobin 13.6 13.0 - 17.0 g/dL   HCT 41.3 39.0 - 52.0 %   MCV 94.9 80.0 - 100.0 fL   MCH 31.3 26.0 - 34.0 pg   MCHC 32.9 30.0 - 36.0 g/dL   RDW 13.1 11.5 - 15.5 %   Platelets 201 150 - 400 K/uL   nRBC 0.1 0.0 - 0.2 %   Neutrophils Relative % 35 %   Neutro Abs 5.1 1.7 - 7.7 K/uL   Lymphocytes Relative 59 %   Lymphs Abs 8.7 (H) 0.7 - 4.0 K/uL   Monocytes Relative 5 %   Monocytes Absolute 0.7 0.1 - 1.0 K/uL   Eosinophils Relative 1 %   Eosinophils Absolute 0.2 0.0 - 0.5 K/uL   Basophils Relative 0 %   Basophils Absolute 0.1 0.0 - 0.1 K/uL   Immature Granulocytes 0 %   Abs Immature Granulocytes 0.05 0.00 - 0.07 K/uL   Smudge Cells PRESENT     Comment: Performed at [facility], [place] [address], [place], [place] [address]   Dg Chest 2 View  Result Date: 05/22/2018 CLINICAL DATA:  77 year old with current history of dementia with acute mental status changes associated with aggressive behavior. Recent fall. EXAM: CHEST - 2 VIEW COMPARISON:  04/07/2018. FINDINGS: Cardiac silhouette normal in size, unchanged. Thoracic aorta atherosclerotic. Hilar and mediastinal contours otherwise unremarkable. Lungs clear. Bronchovascular markings normal. Pulmonary vascularity normal. No visible pleural effusions. No pneumothorax. Mild degenerative changes involving the thoracic and upper lumbar spine. IMPRESSION: No acute cardiopulmonary disease. Electronically Signed   By: Evangeline Dakin M.D.   On: 05/22/2018 12:50   None  Pending Labs Unresulted Labs (From admission, onward)    Start     Ordered   05/23/18 0500  CBC  Tomorrow morning,   R     05/22/18 1635   05/23/18 7425  Basic metabolic panel  Tomorrow morning,   R     05/22/18 1635    05/22/18 1629  Culture, blood (routine x 2)  BLOOD  CULTURE X 2,   R     05/22/18 1630   05/22/18 1204  CBC with Differential/Platelet  Once,   R     05/22/18 1203   05/22/18 1204  Urine Culture  ONCE - STAT,   STAT     05/22/18 1203          Vitals/Pain Today's Vitals   05/22/18 1153 05/22/18 1208 05/22/18 1627  BP: 125/62  (!) 146/69  Pulse: 86  77  Resp: 18  18  Temp: 98 F (36.7 C)  97.6 F (36.4 C)  TempSrc: Oral  Oral  SpO2: 100%  100%  PainSc:  2      Isolation Precautions No active isolations  Medications Medications  0.9 %  sodium chloride infusion (20 mL/hr Intravenous New Bag/Given (Non-Interop) 05/22/18 1318)  cefTRIAXone (ROCEPHIN) 1 g in sodium chloride 0.9 % 100 mL IVPB (1 g Intravenous New Bag/Given (Non-Interop) 05/22/18 1409)  cefTRIAXone (ROCEPHIN) 1 g in sodium chloride 0.9 % 100 mL IVPB (has no administration in time range)  0.9 %  sodium chloride infusion (has no administration in time range)  insulin aspart (novoLOG) injection 0-9 Units (has no administration in time range)  insulin aspart (novoLOG) injection 0-5 Units (has no administration in time range)  aspirin tablet 81 mg (has no administration in time range)  HYDROcodone-acetaminophen (NORCO/VICODIN) 5-325 MG per tablet 1 tablet (has no administration in time range)  pravastatin (PRAVACHOL) tablet 20 mg (has no administration in time range)  buPROPion (WELLBUTRIN XL) 24 hr tablet 300 mg (has no administration in time range)  donepezil (ARICEPT) tablet 10 mg (has no administration in time range)  QUEtiapine (SEROQUEL) tablet 25 mg (has no administration in time range)  traZODone (DESYREL) tablet 150 mg (has no administration in time range)  tamsulosin (FLOMAX) capsule 0.4 mg (has no administration in time range)  Dimethicone-Zinc Oxide 5-5 % OINT (has no administration in time range)  enoxaparin (LOVENOX) injection 40 mg (has no administration in time range)    Mobility walks with person  assist

## 2018-05-22 NOTE — ED Provider Notes (Signed)
[facility] DEPT Provider Note   CSN: [id number] Arrival date & time: 05/22/18  1143     History   Chief Complaint Chief Complaint  Patient presents with  . Aggressive Behavior    HPI Hayden [name] is a 77 y.o. male.  This is a 77 year old male with history of depression and mild dementia who presents from his nursing home due to aggressive behavior and refusing to take his medications.  Denies any suicidal homicidal ideations.  Denies any hallucinations.  Patient would not state what happened exactly at his facility.  Patient is very worried when you speak with him and is difficult to get him to keep on track at times.  Therefore the history is very limited.     Past Medical History:  Diagnosis Date  . Depression   . Diabetes mellitus   . Enlarged prostate   . Fibromyalgia   . GERD (gastroesophageal reflux disease)    reflux intermittent  . Hyperlipidemia   . Leukemia [facility]) chronic lymphocytic   2008 diagnosed-monitored Dr     Patient Active Problem List   Diagnosis Date Noted  . Cellulitis 04/17/2018  . Cellulitis, scrotum 03/09/2018  . Cellulitis of male genitalia 03/08/2018  . Depression 03/08/2018  . BPH (benign prostatic hyperplasia) 03/08/2018  . Leukocytosis 03/08/2018  . Mild dementia ([place]) 10/23/2017  . UTI (urinary tract infection) 09/06/2017  . Hyperglycemia 09/06/2017  . ARF (acute renal failure) ([place]) 09/06/2017  . Altered mental status 09/06/2017  . Type 2 diabetes mellitus with complication ([place]) 70/35/0093  . 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 ADENOIDECTOMY    . CARPAL TUNNEL RELEASE     rt hand  . CATARACT EXTRACTION, BILATERAL    . CERVICAL FUSION  1998  . CYSTOSCOPY  07/26/2012   Procedure: CYSTOSCOPY;  Surgeon: Ailene Rud, MD;  Location: [facility];  Service: Urology;  Laterality: N/A;  DIAGNOSTIC  CYSTO PROSTATE ULTRASOUND      . PROSTATE BIOPSY  07/26/2012   Procedure: PROSTATE BIOPSY;  Surgeon: Ailene Rud, MD;  Location: [facility];  Service: Urology;  Laterality: N/A;  . retinal micro aneurysms    . tonsil    . VASECTOMY    . VITRECTOMY          Home Medications    Prior to Admission medications   Medication Sig Start Date End Date Taking? Authorizing Provider  aspirin 81 MG tablet Take 81 mg by mouth every morning.     [provider]  buPROPion (WELLBUTRIN XL) 300 MG 24 hr tablet Take 300 mg by mouth every morning.     [provider]  cetirizine (ZYRTEC) 10 MG tablet Take 10 mg by mouth daily as needed for allergies.     [provider]  Cholecalciferol (VITAMIN D3) 1000 UNITS CAPS Take 1,000 Units by mouth daily after breakfast.     [provider]  donepezil (ARICEPT) 10 MG tablet Take 1 tablet (10 mg total) by mouth at bedtime. 05/03/18   Cameron Sprang, MD  EPINEPHrine (EPIPEN JR) 0.15 MG/0.3ML injection Inject 0.15 mg into the muscle as needed for anaphylaxis.    [provider]  fish oil-omega-3 fatty acids 1000 MG capsule Take 1 g by mouth 2 (two) times daily.     [provider]  insulin lispro (HUMALOG KWIKPEN) 100 UNIT/ML KiwkPen Inject 4-10 Units into the skin  3 (three) times daily before meals.     [provider]  LEVEMIR FLEXTOUCH 100 UNIT/ML Pen Inject 36 Units into the skin at bedtime.  07/16/15   [provider]  naproxen sodium (ALEVE) 220 MG tablet Take 220 mg by mouth every 12 (twelve) hours as needed (pain).    [provider]  neomycin-bacitracin-polymyxin (NEOSPORIN) OINT Apply 1 application topically as needed for wound care (left shoulder).    [provider]  nystatin cream (MYCOSTATIN) Apply to affected area 2 times daily Patient taking differently: Apply 1 application topically 2 (two) times daily.  04/23/18   [name], PA-C   QUEtiapine (SEROQUEL) 25 MG tablet Take 1/2 tablet in AM, 1 tablet in PM Patient taking differently: Take 12.5-25 mg by mouth See admin instructions. Take 1/2 tablet in AM, 1 tablet in PM 05/03/18   Cameron Sprang, MD  tamsulosin (FLOMAX) 0.4 MG CAPS capsule Take 0.4 mg by mouth every evening.     [provider]  traZODone (DESYREL) 100 MG tablet Take 150 mg by mouth at bedtime.    [provider]  vitamin B-12 (CYANOCOBALAMIN) 1000 MCG tablet Take 1,000 mcg by mouth every morning.     [provider]  calcium citrate-vitamin D (CITRACAL+D) 315-200 MG-UNIT per tablet Take 1 tablet by mouth daily.  09/04/11  [provider]  terazosin (HYTRIN) 5 MG capsule Take 5 mg by mouth at bedtime.  09/04/11  [provider]    Family History Family History  Problem Relation Age of Onset  . Diabetes Father   . Diabetes Sister   . Diabetes Brother     Social History Social History   Tobacco Use  . Smoking status: Never Smoker  . Smokeless tobacco: Never Used  Substance Use Topics  . Alcohol use: No    Frequency: Never  . Drug use: No     Allergies   Bee venom; Glucophage [metformin]; Shellfish allergy; Grass extracts [gramineae pollens]; and Tobacco [nicotiana tabacum]   Review of Systems Review of Systems  Unable to perform ROS: Dementia     Physical Exam Updated Vital Signs BP 125/62 (BP Location: Right Arm)   Pulse 86   Temp 98 F (36.7 C) (Oral)   Resp 18   SpO2 100%   Physical Exam  Constitutional: He is oriented to person, place, and time. He appears well-developed and well-nourished.  Non-toxic appearance. No distress.  HENT:  Head: Normocephalic and atraumatic.  Eyes: Pupils are equal, round, and reactive to light. Conjunctivae, EOM and lids are normal.  Neck: Normal range of motion. Neck supple. No tracheal deviation present. No thyroid mass present.  Cardiovascular: Normal rate, regular rhythm and normal heart sounds. Exam  reveals no gallop.  No murmur heard. Pulmonary/Chest: Effort normal and breath sounds normal. No stridor. No respiratory distress. He has no decreased breath sounds. He has no wheezes. He has no rhonchi. He has no rales.  Abdominal: Soft. Normal appearance and bowel sounds are normal. He exhibits no distension. There is no tenderness. There is no rebound and no CVA tenderness.  Musculoskeletal: Normal range of motion. He exhibits no edema or tenderness.  Neurological: He is alert and oriented to person, place, and time. He has normal strength. He is not disoriented. He displays no tremor. No cranial nerve deficit or sensory deficit. GCS eye subscore is 4. GCS verbal subscore is 5. GCS motor subscore is 6.  Skin: Skin is warm and dry. No abrasion and no  rash noted.  Psychiatric: He has a normal mood and affect. His behavior is normal. His speech is rapid and/or pressured. He expresses no homicidal and no suicidal ideation. He expresses no suicidal plans and no homicidal plans.  Nursing note and vitals reviewed.    ED Treatments / Results  Labs (all labs ordered are listed, but only abnormal results are displayed) Labs Reviewed  URINE CULTURE  CBC WITH DIFFERENTIAL/PLATELET  COMPREHENSIVE METABOLIC PANEL  URINALYSIS, ROUTINE W REFLEX MICROSCOPIC    EKG None  Radiology No results found.  Procedures Procedures (including critical care time)  Medications Ordered in ED Medications  0.9 %  sodium chloride infusion (has no administration in time range)     Initial Impression / Assessment and Plan / ED Course  I have reviewed the triage vital signs and the nursing notes.  Pertinent labs & imaging results that were available during my care of the patient were reviewed by me and considered in my medical decision making (see chart for details).     Patient is urinalysis consistent with infection.  He has grown out Klebsiella in his urine in the past which is been sensitive to Rocephin  and this was started.  Mild leukocytosis at 14.8 thousand. Chest x-ray without acute findings.  Patient will require medical admission Final Clinical Impressions(s) / ED Diagnoses   Final diagnoses:  SOB (shortness of breath)    ED Discharge Orders    None       Lacretia Leigh, MD 05/22/18 1557

## 2018-05-22 NOTE — H&P (Signed)
History and Physical    Hayden [name]:[id number] DOB: June 03, 1941 DOA: 05/22/2018  PCP: London Pepper, MD   Patient coming from: ALF    Chief Complaint: Agitation, increased urinary frequency  HPI: Hayden [name] is a 77 y.o. male with medical history significant of depression, dementia, diabetes, hyperlipidemia, BPH, chronic lymphocytic leukemia who was sent from assisted living facility for the evaluation of change in the mental status, aggressive behavior, agitation.  Nursing staff at the facility felt that his agitation was due to urine tract infection as he had history of urine tract infection in the past. UA done in the emergency department was suggestive of tract infection.  He has mild leukocytosis.  Patient was hemodynamically stable. He was also reported to be falling frequently.  His last fall was about 2 to 3 days ago.  He had presented to the emergency department for a fall about 5 days ago.  He suffered a laceration to his scalp and suture applied. Patient seen and examined the bedside in the emergency department.  During my evaluation, he was calm and cooperative.  He was hemodynamically stable.  Afebrile. He reported to me that he was having incontinence of the urine but he denied any dysuria.  He denied any chest pain, shortness of breath, palpitations, abdominal pain, nausea, vomiting, fever, chills, headache, diarrhea.  ED Course:  UA suggestive of urinary tract infection.  He had mild leukocytosis on presentation. He was hemodynamically stable.  We are asked to admit for urinary tract infection and altered mental status saying that assisted  living facility will not accept the patient because of the agitation.  He was given a dose of ceftriaxone.   Past Medical History:  Diagnosis Date  . Depression   . Diabetes mellitus   . Enlarged prostate   . Fibromyalgia   . GERD (gastroesophageal reflux disease)    reflux intermittent  . Hyperlipidemia   . Leukemia ([place])  chronic lymphocytic   2008 diagnosed-monitored Dr     Past Surgical History:  Procedure Laterality Date  . ADENOIDECTOMY    . CARPAL TUNNEL RELEASE     rt hand  . CATARACT EXTRACTION, BILATERAL    . CERVICAL FUSION  1998  . CYSTOSCOPY  07/26/2012   Procedure: CYSTOSCOPY;  Surgeon: Ailene Rud, MD;  Location: [facility];  Service: Urology;  Laterality: N/A;  DIAGNOSTIC CYSTO PROSTATE ULTRASOUND      . PROSTATE BIOPSY  07/26/2012   Procedure: PROSTATE BIOPSY;  Surgeon: Ailene Rud, MD;  Location: [facility];  Service: Urology;  Laterality: N/A;  . retinal micro aneurysms    . tonsil    . VASECTOMY    . VITRECTOMY       reports that he has never smoked. He has never used smokeless tobacco. He reports that he does not drink alcohol or use drugs.  Allergies  Allergen Reactions  . Bee Venom Other (See Comments)    Bump where stung and took days to go away  . Glucophage [Metformin] Diarrhea    "i will never take it again -stool incontinent "  . Shellfish Allergy Swelling  . Grass Extracts [Gramineae Pollens]   . Tobacco [Nicotiana Tabacum]     Family History  Problem Relation Age of Onset  . Diabetes Father   . Diabetes Sister   . Diabetes Brother      Prior to Admission medications   Medication Sig Start Date End Date Taking? Authorizing Provider  aspirin 81 MG tablet Take 81 mg by mouth every morning.    Yes [provider]  buPROPion (WELLBUTRIN XL) 300 MG 24 hr tablet Take 300 mg by mouth every morning.    Yes [provider]  cetirizine (ZYRTEC) 10 MG tablet Take 10 mg by mouth daily as needed for allergies.    Yes [provider]  Cholecalciferol (VITAMIN D3) 1000 UNITS CAPS Take 1,000 Units by mouth daily after breakfast.    Yes [provider]  DIMETHICONE-ZINC OXIDE EX Apply 1 application topically See admin instructions. Apply topically to buttocks and scrotum after each  incontinence episode   Yes [provider]  donepezil (ARICEPT) 10 MG tablet Take 1 tablet (10 mg total) by mouth at bedtime. 05/03/18  Yes Cameron Sprang, MD  fish oil-omega-3 fatty acids 1000 MG capsule Take 1 g by mouth 2 (two) times daily.    Yes [provider]  HYDROcodone-acetaminophen (NORCO/VICODIN) 5-325 MG tablet Take 1 tablet by mouth every 6 (six) hours as needed for moderate pain.   Yes [provider]  hydrOXYzine (ATARAX/VISTARIL) 25 MG tablet Take 25 mg by mouth 3 (three) times daily as needed for itching.   Yes [provider]  insulin lispro (HUMALOG KWIKPEN) 100 UNIT/ML KiwkPen Inject 4-10 Units into the skin 3 (three) times daily before meals.    Yes [provider]  LEVEMIR FLEXTOUCH 100 UNIT/ML Pen Inject 36 Units into the skin at bedtime.  07/16/15  Yes [provider]  lovastatin (MEVACOR) 20 MG tablet Take 20 mg by mouth daily.   Yes [provider]  naproxen sodium (ALEVE) 220 MG tablet Take 220 mg by mouth every 12 (twelve) hours as needed (pain).   Yes [provider]  neomycin-bacitracin-polymyxin (NEOSPORIN) OINT Apply 1 application topically as needed for wound care (left shoulder).   Yes [provider]  nystatin cream (MYCOSTATIN) Apply to affected area 2 times daily Patient taking differently: Apply 1 application topically 2 (two) times daily.  04/23/18  Yes [name], PA-C  QUEtiapine (SEROQUEL) 25 MG tablet Take 1/2 tablet in AM, 1 tablet in PM Patient taking differently: Take 12.5-25 mg by mouth See admin instructions. Take 1/2 tablet in AM, 1 tablet in PM 05/03/18  Yes Cameron Sprang, MD  tamsulosin (FLOMAX) 0.4 MG CAPS capsule Take 0.4 mg by mouth every evening.    Yes [provider]  traZODone (DESYREL) 100 MG tablet Take 150 mg by mouth at bedtime.   Yes [provider]  vitamin B-12 (CYANOCOBALAMIN) 1000 MCG tablet Take 1,000 mcg by mouth every morning.     Yes [provider]  EPINEPHrine (EPIPEN JR) 0.15 MG/0.3ML injection Inject 0.15 mg into the muscle as needed for anaphylaxis.    [provider]  calcium citrate-vitamin D (CITRACAL+D) 315-200 MG-UNIT per tablet Take 1 tablet by mouth daily.  09/04/11  [provider]  terazosin (HYTRIN) 5 MG capsule Take 5 mg by mouth at bedtime.  09/04/11  [provider]    Physical Exam: Vitals:   05/22/18 1153 05/22/18 1627  BP: 125/62 (!) 146/69  Pulse: 86 77  Resp: 18 18  Temp: 98 F (36.7 C) 97.6 F (36.4 C)  TempSrc: Oral Oral  SpO2: 100% 100%    Constitutional: NAD, calm, comfortable Vitals:   05/22/18 1153 05/22/18 1627  BP: 125/62 (!) 146/69  Pulse: 86 77  Resp: 18 18  Temp: 98 F (36.7 C) 97.6 F (36.4 C)  TempSrc: Oral Oral  SpO2: 100% 100%   Eyes: PERRL, lids and conjunctivae normal ENMT: Mucous membranes are moist. Posterior pharynx clear of any exudate or lesions.Normal dentition.  Neck: normal, supple, no masses, no thyromegaly Respiratory: clear to auscultation bilaterally, no wheezing, no crackles. Normal respiratory effort. No accessory muscle use.  Cardiovascular: Regular rate and rhythm, no murmurs / rubs / gallops. No extremity edema. 2+ pedal pulses. No carotid bruits.  Abdomen: no tenderness, no masses palpated. No hepatosplenomegaly. Bowel sounds positive.  Musculoskeletal: no clubbing / cyanosis. No joint deformity upper and lower extremities. Good ROM, no contractures. Normal muscle tone.  Skin: no rashes, lesions, ulcers. No induration Neurologic: CN 2-12 grossly intact. Sensation intact, DTR normal. Strength 5/5 in all 4.  Psychiatric: Normal judgment and insight. Alert and oriented x 3. Pressured speech  Foley Catheter:None  Labs on Admission: I have personally reviewed following labs and imaging studies  CBC: Recent Labs  Lab 05/22/18 1532  WBC 14.8*  NEUTROABS 5.1  HGB 13.6  HCT 41.3  MCV 94.9  PLT 595   Basic  Metabolic Panel: Recent Labs  Lab 05/22/18 1309  NA 140  K 4.1  CL 104  CO2 26  GLUCOSE 83  BUN 26*  CREATININE 0.82  CALCIUM 9.6   GFR: Estimated Creatinine Clearance: 64.3 mL/min (by C-G formula based on SCr of 0.82 mg/dL). Liver Function Tests: Recent Labs  Lab 05/22/18 1309  AST 30  ALT 37  ALKPHOS 57  BILITOT 1.1  PROT 6.8  ALBUMIN 4.2   No results for input(s): LIPASE, AMYLASE in the last 168 hours. No results for input(s): AMMONIA in the last 168 hours. Coagulation Profile: No results for input(s): INR, PROTIME in the last 168 hours. Cardiac Enzymes: No results for input(s): CKTOTAL, CKMB, CKMBINDEX, TROPONINI in the last 168 hours. BNP (last 3 results) No results for input(s): PROBNP in the last 8760 hours. HbA1C: No results for input(s): HGBA1C in the last 72 hours. CBG: No results for input(s): GLUCAP in the last 168 hours. Lipid Profile: No results for input(s): CHOL, HDL, LDLCALC, TRIG, CHOLHDL, LDLDIRECT in the last 72 hours. Thyroid Function Tests: No results for input(s): TSH, T4TOTAL, FREET4, T3FREE, THYROIDAB in the last 72 hours. Anemia Panel: No results for input(s): VITAMINB12, FOLATE, FERRITIN, TIBC, IRON, RETICCTPCT in the last 72 hours. Urine analysis:    Component Value Date/Time   COLORURINE YELLOW 05/22/2018 1204   APPEARANCEUR HAZY (A) 05/22/2018 1204   LABSPEC 1.014 05/22/2018 1204   PHURINE 8.0 05/22/2018 1204   GLUCOSEU NEGATIVE 05/22/2018 1204   HGBUR SMALL (A) 05/22/2018 1204   BILIRUBINUR NEGATIVE 05/22/2018 1204   KETONESUR 5 (A) 05/22/2018 1204   PROTEINUR NEGATIVE 05/22/2018 1204   NITRITE NEGATIVE 05/22/2018 1204   LEUKOCYTESUR MODERATE (A) 05/22/2018 1204    Radiological Exams on Admission: Dg Chest 2 View  Result Date: 05/22/2018 CLINICAL DATA:  77 year old with current history of dementia with acute mental status changes associated with aggressive behavior. Recent fall. EXAM: CHEST - 2 VIEW COMPARISON:   04/07/2018. FINDINGS: Cardiac silhouette normal in size, unchanged. Thoracic aorta atherosclerotic. Hilar and mediastinal contours otherwise unremarkable. Lungs clear. Bronchovascular markings normal. Pulmonary vascularity normal. No visible pleural effusions. No pneumothorax. Mild degenerative changes involving the thoracic and upper lumbar spine. IMPRESSION: No acute cardiopulmonary disease. Electronically Signed   By: Evangeline Dakin M.D.   On: 05/22/2018 12:50     Assessment/Plan Principal Problem:   UTI (urinary tract infection) Active Problems:   CLL (  chronic lymphocytic leukemia) ([place])   Type 2 diabetes mellitus with complication ([facility])   Altered mental status   Depression   BPH (benign prostatic hyperplasia)   Leukocytosis  UTI: Has history of Klebsiella UTI in the past sensitive to ceftriaxone.  Presented with incontinence. Denies any dysuria.  Urine culture, blood culture has been sent.  We will continue IV antibiotic for now.  We can probably  change to oral antibiotic very soon as he is able to tolerate by mouth and is hemodynamically stable.  Altered mental status: During my evaluation he was completely alert and oriented.  He has history of dementia.  As per the staff from ALF, he was very agitated and altered. Patient is on Seroquel at night.  We will continue to monitor his mental status.  He is high risk for sundowning/delirium.  Depression: We will resume his home medications.  Current his mood is looks stable.   Dementia: Resident of assisted living facility.  On donepezil.  Type 2 diabetes mellitus: On insulin at the assisted  living facility.  We will continue to monitor his blood sugars.  Continue sliding-scale insulin here.  BPH: Continue tamsulosin  CLL: Currently in remission.  Not receiving any treatment currently.  He follows with oncology as an outpatient on as-needed basis.  Frequent falls: Has been falling frequently at the assisted living facility.   Ambulates with a cane.  Will request for PT evaluation.  Hyper lipidemia: Continue statin  Severity of Illness: The appropriate patient status for this patient is OBSERVATION  DVT prophylaxis: Lovenox Code Status: Full Family Communication: None present at the bedside Consults called: None     Shelly Coss MD [facility] Pager [phone]  If 7PM-7AM, please contact night-coverage [email or web address] Password [facility]  05/22/2018, 4:35 PM

## 2018-05-22 NOTE — ED Notes (Signed)
Maintenance through the RN on the receiving floor has requesting about 45 mins to fix the light on the assigned room.

## 2018-05-22 NOTE — ED Notes (Signed)
Bed: WA25 Expected date:  Expected time:  Means of arrival:  Comments: EMS  

## 2018-05-23 ENCOUNTER — Observation Stay (HOSPITAL_COMMUNITY): Payer: Medicare Other

## 2018-05-23 DIAGNOSIS — L8990 Pressure ulcer of unspecified site, unspecified stage: Secondary | ICD-10-CM

## 2018-05-23 DIAGNOSIS — F329 Major depressive disorder, single episode, unspecified: Secondary | ICD-10-CM | POA: Diagnosis not present

## 2018-05-23 DIAGNOSIS — N400 Enlarged prostate without lower urinary tract symptoms: Secondary | ICD-10-CM | POA: Diagnosis present

## 2018-05-23 DIAGNOSIS — R0602 Shortness of breath: Secondary | ICD-10-CM | POA: Diagnosis not present

## 2018-05-23 DIAGNOSIS — C9111 Chronic lymphocytic leukemia of B-cell type in remission: Secondary | ICD-10-CM | POA: Diagnosis present

## 2018-05-23 DIAGNOSIS — R4182 Altered mental status, unspecified: Secondary | ICD-10-CM | POA: Diagnosis not present

## 2018-05-23 DIAGNOSIS — Z79899 Other long term (current) drug therapy: Secondary | ICD-10-CM | POA: Diagnosis not present

## 2018-05-23 DIAGNOSIS — Z794 Long term (current) use of insulin: Secondary | ICD-10-CM | POA: Diagnosis not present

## 2018-05-23 DIAGNOSIS — M797 Fibromyalgia: Secondary | ICD-10-CM | POA: Diagnosis present

## 2018-05-23 DIAGNOSIS — Z7401 Bed confinement status: Secondary | ICD-10-CM | POA: Diagnosis not present

## 2018-05-23 DIAGNOSIS — L89301 Pressure ulcer of unspecified buttock, stage 1: Secondary | ICD-10-CM | POA: Diagnosis present

## 2018-05-23 DIAGNOSIS — Z91030 Bee allergy status: Secondary | ICD-10-CM | POA: Diagnosis not present

## 2018-05-23 DIAGNOSIS — N390 Urinary tract infection, site not specified: Secondary | ICD-10-CM | POA: Diagnosis present

## 2018-05-23 DIAGNOSIS — G9340 Encephalopathy, unspecified: Secondary | ICD-10-CM | POA: Diagnosis not present

## 2018-05-23 DIAGNOSIS — Z7982 Long term (current) use of aspirin: Secondary | ICD-10-CM | POA: Diagnosis not present

## 2018-05-23 DIAGNOSIS — Z9119 Patient's noncompliance with other medical treatment and regimen: Secondary | ICD-10-CM | POA: Diagnosis not present

## 2018-05-23 DIAGNOSIS — E118 Type 2 diabetes mellitus with unspecified complications: Secondary | ICD-10-CM | POA: Diagnosis present

## 2018-05-23 DIAGNOSIS — R32 Unspecified urinary incontinence: Secondary | ICD-10-CM | POA: Diagnosis present

## 2018-05-23 DIAGNOSIS — K219 Gastro-esophageal reflux disease without esophagitis: Secondary | ICD-10-CM | POA: Diagnosis present

## 2018-05-23 DIAGNOSIS — Z91013 Allergy to seafood: Secondary | ICD-10-CM | POA: Diagnosis not present

## 2018-05-23 DIAGNOSIS — B961 Klebsiella pneumoniae [K. pneumoniae] as the cause of diseases classified elsewhere: Secondary | ICD-10-CM | POA: Diagnosis present

## 2018-05-23 DIAGNOSIS — C9110 Chronic lymphocytic leukemia of B-cell type not having achieved remission: Secondary | ICD-10-CM | POA: Diagnosis not present

## 2018-05-23 DIAGNOSIS — M2550 Pain in unspecified joint: Secondary | ICD-10-CM | POA: Diagnosis not present

## 2018-05-23 DIAGNOSIS — E785 Hyperlipidemia, unspecified: Secondary | ICD-10-CM | POA: Diagnosis present

## 2018-05-23 DIAGNOSIS — N401 Enlarged prostate with lower urinary tract symptoms: Secondary | ICD-10-CM | POA: Diagnosis not present

## 2018-05-23 DIAGNOSIS — D72829 Elevated white blood cell count, unspecified: Secondary | ICD-10-CM | POA: Diagnosis not present

## 2018-05-23 DIAGNOSIS — F0391 Unspecified dementia with behavioral disturbance: Secondary | ICD-10-CM | POA: Diagnosis not present

## 2018-05-23 DIAGNOSIS — Z87440 Personal history of urinary (tract) infections: Secondary | ICD-10-CM | POA: Diagnosis not present

## 2018-05-23 DIAGNOSIS — Z833 Family history of diabetes mellitus: Secondary | ICD-10-CM | POA: Diagnosis not present

## 2018-05-23 DIAGNOSIS — R296 Repeated falls: Secondary | ICD-10-CM | POA: Diagnosis present

## 2018-05-23 LAB — BASIC METABOLIC PANEL WITH GFR
Anion gap: 7 (ref 5–15)
BUN: 20 mg/dL (ref 8–23)
CO2: 26 mmol/L (ref 22–32)
Calcium: 8.9 mg/dL (ref 8.9–10.3)
Chloride: 108 mmol/L (ref 98–111)
Creatinine, Ser: 0.79 mg/dL (ref 0.61–1.24)
GFR calc Af Amer: >60 mL/min
GFR calc non Af Amer: >60 mL/min
Glucose, Bld: 122 mg/dL — ABNORMAL HIGH (ref 70–99)
Potassium: 3.7 mmol/L (ref 3.5–5.1)
Sodium: 141 mmol/L (ref 135–145)

## 2018-05-23 LAB — CBC
HCT: 40.6 % (ref 39.0–52.0)
HEMOGLOBIN: 13.1 g/dL (ref 13.0–17.0)
MCH: 31 pg (ref 26.0–34.0)
MCHC: 32.3 g/dL (ref 30.0–36.0)
MCV: 96.2 fL (ref 80.0–100.0)
PLATELETS: 198 10*3/uL (ref 150–400)
RBC: 4.22 MIL/uL (ref 4.22–5.81)
RDW: 13.2 % (ref 11.5–15.5)
WBC: 11.7 10*3/uL — AB (ref 4.0–10.5)
nRBC: 0.2 % (ref 0.0–0.2)

## 2018-05-23 LAB — GLUCOSE, CAPILLARY
GLUCOSE-CAPILLARY: 101 mg/dL — AB (ref 70–99)
GLUCOSE-CAPILLARY: 155 mg/dL — AB (ref 70–99)
GLUCOSE-CAPILLARY: 246 mg/dL — AB (ref 70–99)
Glucose-Capillary: 265 mg/dL — ABNORMAL HIGH (ref 70–99)

## 2018-05-23 LAB — MRSA PCR SCREENING: MRSA by PCR: POSITIVE — AB

## 2018-05-23 MED ORDER — MUPIROCIN 2 % EX OINT
1.0000 "application " | TOPICAL_OINTMENT | Freq: Two times a day (BID) | CUTANEOUS | Status: DC
  Administered 2018-05-23 – 2018-05-25 (×5): 1 via NASAL
  Filled 2018-05-23: qty 22

## 2018-05-23 MED ORDER — CHLORHEXIDINE GLUCONATE CLOTH 2 % EX PADS
6.0000 | MEDICATED_PAD | Freq: Every day | CUTANEOUS | Status: DC
  Administered 2018-05-23 – 2018-05-25 (×3): 6 via TOPICAL

## 2018-05-23 NOTE — Progress Notes (Signed)
CRITICAL VALUE ALERT  Critical Value:  PCR  MRSA swab postive  Date & Time Notied:  05/23/2018 0248  Provider Notified:   Orders Received/Actions taken: standing orders

## 2018-05-23 NOTE — Evaluation (Signed)
Physical Therapy Evaluation Patient Details Name: Hayden [name] MRN: [id number] DOB: December 04, 1940 Today's Date: 05/23/2018   History of Present Illness  Pt is a 77 y.o. male with medical history significant of depression, dementia, diabetes, hyperlipidemia, BPH, and chronic lymphocytic leukemia. He was sent from assisted living facility for the evaluation of change in the mental status, aggressive behavior, and agitation.  Pt admitted with dx of UTI.     Clinical Impression  Pt admitted with above diagnosis. Pt currently with functional limitations due to the deficits listed below (see PT Problem List). On eval, pt required supervision bed mobility, min guard assist transfers, and min guard assist ambulation 175 feet with RW. Pt with 2 recent falls. At risk for continued falls due to balance and safety awareness deficits. Pt will benefit from skilled PT to increase their independence and safety with mobility to allow discharge to the venue listed below.  Pt able to return to ALF with HHPT, if ALF staff able to provided needed level of care/assist.     Follow Up Recommendations SNF    Equipment Recommendations  Rolling walker with 5" wheels    Recommendations for Other Services       Precautions / Restrictions Precautions Precautions: Fall Precaution Comments: recent fall with laceration to head (sutured)      Mobility  Bed Mobility Overal bed mobility: Needs Assistance Bed Mobility: Supine to Sit;Sit to Supine     Supine to sit: Supervision;HOB elevated Sit to supine: Supervision;HOB elevated   General bed mobility comments: increased time and effort, supervision for safety  Transfers Overall transfer level: Needs assistance Equipment used: Ambulation equipment used Transfers: Sit to/from [employer] Transfers Sit to Stand: Min guard Stand pivot transfers: Min guard       General transfer comment: increased time to stabilize initial standing  balance  Ambulation/Gait Ambulation/Gait assistance: Min guard Gait Distance (Feet): 175 Feet Assistive device: Rolling walker (2 wheeled) Gait Pattern/deviations: Step-through pattern;Decreased stride length Gait velocity: mildly decreased Gait velocity interpretation: 1.31 - 2.62 ft/sec, indicative of limited community ambulator General Gait Details: min guard assist for safety. Distance limited by genital pain.  Stairs            Wheelchair Mobility    Modified Rankin (Stroke Patients Only)       Balance Overall balance assessment: Needs assistance Sitting-balance support: No upper extremity supported;Feet supported Sitting balance-Leahy Scale: Good     Standing balance support: Bilateral upper extremity supported;During functional activity Standing balance-Leahy Scale: Poor Standing balance comment: reliant on BUE support                             Pertinent Vitals/Pain Pain Assessment: Faces Faces Pain Scale: Hurts little more Pain Location: groin/genital area Pain Descriptors / Indicators: Sore;Burning Pain Intervention(s): Monitored during session;Limited activity within patient's tolerance    Home Living Family/patient expects to be discharged to:: Assisted living               Home Equipment: Cane - quad      Prior Function Level of Independence: Needs assistance   Gait / Transfers Assistance Needed: Mod I with Quad cane  ADL's / Homemaking Assistance Needed: goes to dining room for meals        Hand Dominance   Dominant Hand: Right    Extremity/Trunk Assessment   Upper Extremity Assessment Upper Extremity Assessment: Overall WFL for tasks assessed    Lower Extremity Assessment  Lower Extremity Assessment: Overall WFL for tasks assessed    Cervical / Trunk Assessment Cervical / Trunk Assessment: Kyphotic  Communication   Communication: No difficulties  Cognition Arousal/Alertness: Awake/alert Behavior During  Therapy: WFL for tasks assessed/performed Overall Cognitive Status: History of cognitive impairments - at baseline                                 General Comments: A&O x 3. Reports and is aware of memory issues. Follows commands. Very pleasant and receptive to care.      General Comments      Exercises     Assessment/Plan    PT Assessment Patient needs continued PT services  PT Problem List Decreased mobility;Decreased safety awareness;Decreased knowledge of precautions;Decreased activity tolerance;Decreased knowledge of use of DME;Decreased balance       PT Treatment Interventions DME instruction;Therapeutic activities;Gait training;Therapeutic exercise;Patient/family education;Balance training;Functional mobility training    PT Goals (Current goals can be found in the Care Plan section)  Acute Rehab PT Goals Patient Stated Goal: walk with his cane PT Goal Formulation: With patient Time For Goal Achievement: 06/06/18 Potential to Achieve Goals: Good    Frequency Min 3X/week   Barriers to discharge        Co-evaluation               AM-PAC PT "6 Clicks" Mobility  Outcome Measure Help needed turning from your back to your side while in a flat bed without using bedrails?: None Help needed moving from lying on your back to sitting on the side of a flat bed without using bedrails?: None Help needed moving to and from a bed to a chair (including a wheelchair)?: A Little Help needed standing up from a chair using your arms (e.g., wheelchair or bedside chair)?: A Little Help needed to walk in hospital room?: A Little Help needed climbing 3-5 steps with a railing? : A Lot 6 Click Score: 19    End of Session Equipment Utilized During Treatment: Gait belt Activity Tolerance: Patient tolerated treatment well Patient left: in bed;with call bell/phone within reach Nurse Communication: Mobility status PT Visit Diagnosis: Difficulty in walking, not elsewhere  classified (R26.2);History of falling (Z91.81)    Time: 3536-1443 PT Time Calculation (min) (ACUTE ONLY): 19 min   Charges:   PT Evaluation $PT Eval Low Complexity: 1 Low          Lorrin Goodell, PT  Office # [phone] Pager [phone]   [name] 05/23/2018, 12:21 PM

## 2018-05-23 NOTE — Progress Notes (Signed)
PROGRESS NOTE    Hayden [name]  ELF:[id number] DOB: Nov 18, 1940 DOA: 05/22/2018 PCP: London Pepper, MD  Outpatient Specialists:   Brief Narrative:  Hayden [name] is a 77 y.o. male with medical history significant of depression, dementia, diabetes, hyperlipidemia, BPH, chronic lymphocytic leukemia who was sent from assisted living facility for the evaluation of change in the mental status, aggressive behavior, agitation.  Nursing staff at the facility felt that his agitation was due to urine tract infection as he had history of urine tract infection in the past.  UA done in the emergency department was suggestive of tract infection.  Urine culture is growing Klebsiella pneumonia.  Repeat CT head is nonrevealing.  Follow final urine cultures.  Pursue disposition in the next 1 to 2 days.  Assessment & Plan:   Principal Problem:   UTI (urinary tract infection) Active Problems:   CLL (chronic lymphocytic leukemia) (HCC)   Type 2 diabetes mellitus with complication (HCC)   Altered mental status   Depression   BPH (benign prostatic hyperplasia)   Leukocytosis   Pressure injury of skin   UTI:  History of Klebsiella UTI in the past sensitive to ceftriaxone.   Presented with incontinence. Denies any dysuria.   Urine culture, blood culture has been sent.   We will continue IV antibiotic for now.   05/23/2018: Repeat urine culture is growing Klebsiella pneumonia.  Altered mental status:  During my evaluation he was completely alert and oriented.  He has history of dementia.  As per the staff from ALF, he was very agitated and altered. Patient is on Seroquel at night.  We will continue to monitor his mental status.  He is high risk for sundowning/delirium. 05/23/2018: Continue to monitor.  Depression:  We will resume his home medications.  Current his mood is looks stable.   Dementia:  Resident of assisted living facility.  On donepezil.  Type 2 diabetes mellitus:  On insulin at  the assisted  living facility.  We will continue to monitor his blood sugars.  Continue sliding-scale insulin here.  BPH:  Continue tamsulosin  CLL:  Currently in remission.  Not receiving any treatment currently.  He follows with oncology as an outpatient on as-needed basis.  Frequent falls:  Has been falling frequently at the assisted living facility.  Ambulates with a cane.  Will request for PT evaluation.  Hyperlipidemia:  Continue statin  Severity of Illness: The appropriate patient status for this patient is OBSERVATION  DVT prophylaxis: Lovenox Code Status: Full Family Communication: None present at the bedside Consults called: None  Procedures:   None  Antimicrobials:   IV Rocephin.   Subjective: No new complaints.  No fever chills.  Objective: Vitals:   05/22/18 1831 05/22/18 1855 05/22/18 2240 05/23/18 0511  BP: (!) 118/59 117/62 (!) 121/55 135/69  Pulse: 83 82 76 78  Resp: 18 20 14 18   Temp: 98.3 F (36.8 C) 99.1 F (37.3 C) 98.3 F (36.8 C) 98 F (36.7 C)  TempSrc: Oral Oral Oral Oral  SpO2: 100% 100% 97% 99%  Weight:  64.9 kg    Height:  5\' 8"  (1.727 m)      Intake/Output Summary (Last 24 hours) at 05/23/2018 1346 Last data filed at 05/23/2018 0200 Gross per 24 hour  Intake 180.72 ml  Output 350 ml  Net -169.28 ml   Filed Weights   05/22/18 1855  Weight: 64.9 kg    Examination:  General exam: Appears calm and comfortable  Respiratory system:  Clear to auscultation. Respiratory effort normal. Cardiovascular system: S1 & S2 heard, RRR. No JVD, murmurs, rubs, gallops or clicks. No pedal edema. Gastrointestinal system: Abdomen is nondistended, soft and nontender. No organomegaly or masses felt. Normal bowel sounds heard. Central nervous system: Alert and oriented. No focal neurological deficits. Extremities: Symmetric 5 x 5 power. Skin: No rashes, lesions or ulcers Psychiatry: Judgement and insight appear normal. Mood & affect  appropriate.     Data Reviewed: I have personally reviewed following labs and imaging studies  CBC: Recent Labs  Lab 05/22/18 1532 05/23/18 0441  WBC 14.8* 11.7*  NEUTROABS 5.1  --   HGB 13.6 13.1  HCT 41.3 40.6  MCV 94.9 96.2  PLT 201 177   Basic Metabolic Panel: Recent Labs  Lab 05/22/18 1309 05/23/18 0441  NA 140 141  K 4.1 3.7  CL 104 108  CO2 26 26  GLUCOSE 83 122*  BUN 26* 20  CREATININE 0.82 0.79  CALCIUM 9.6 8.9   GFR: Estimated Creatinine Clearance: 69.9 mL/min (by C-G formula based on SCr of 0.79 mg/dL). Liver Function Tests: Recent Labs  Lab 05/22/18 1309  AST 30  ALT 37  ALKPHOS 57  BILITOT 1.1  PROT 6.8  ALBUMIN 4.2   No results for input(s): LIPASE, AMYLASE in the last 168 hours. No results for input(s): AMMONIA in the last 168 hours. Coagulation Profile: No results for input(s): INR, PROTIME in the last 168 hours. Cardiac Enzymes: No results for input(s): CKTOTAL, CKMB, CKMBINDEX, TROPONINI in the last 168 hours. BNP (last 3 results) No results for input(s): PROBNP in the last 8760 hours. HbA1C: No results for input(s): HGBA1C in the last 72 hours. CBG: Recent Labs  Lab 05/22/18 2152 05/23/18 0734 05/23/18 1141  GLUCAP 217* 101* 155*   Lipid Profile: No results for input(s): CHOL, HDL, LDLCALC, TRIG, CHOLHDL, LDLDIRECT in the last 72 hours. Thyroid Function Tests: No results for input(s): TSH, T4TOTAL, FREET4, T3FREE, THYROIDAB in the last 72 hours. Anemia Panel: No results for input(s): VITAMINB12, FOLATE, FERRITIN, TIBC, IRON, RETICCTPCT in the last 72 hours. Urine analysis:    Component Value Date/Time   COLORURINE YELLOW 05/22/2018 1204   APPEARANCEUR HAZY (A) 05/22/2018 1204   LABSPEC 1.014 05/22/2018 1204   PHURINE 8.0 05/22/2018 1204   GLUCOSEU NEGATIVE 05/22/2018 1204   HGBUR SMALL (A) 05/22/2018 1204   BILIRUBINUR NEGATIVE 05/22/2018 1204   KETONESUR 5 (A) 05/22/2018 1204   PROTEINUR NEGATIVE 05/22/2018 1204    NITRITE NEGATIVE 05/22/2018 1204   LEUKOCYTESUR MODERATE (A) 05/22/2018 1204   Sepsis Labs: @LABRCNTIP (procalcitonin:4,lacticidven:4)  ) Recent Results (from the past 240 hour(s))  Urine Culture     Status: Abnormal (Preliminary result)   Collection Time: 05/22/18 12:04 PM  Result Value Ref Range Status   Specimen Description   Final    URINE, CLEAN CATCH Performed at [facility], [place] [address] [place], [place] [address]    Special Requests   Final    NONE Performed at [facility], [place] [address] [place], [place] [address]    Culture >=100,000 COLONIES/mL GRAM NEGATIVE RODS (A)  Final   Report Status PENDING  Incomplete  MRSA PCR Screening     Status: Abnormal   Collection Time: 05/23/18 12:25 AM  Result Value Ref Range Status   MRSA by PCR POSITIVE (A) NEGATIVE Final    Comment:        The GeneXpert MRSA Assay (FDA approved for NASAL specimens only), is one  component of a comprehensive MRSA colonization surveillance program. It is not intended to diagnose MRSA infection nor to guide or monitor treatment for MRSA infections. RESULT CALLED TO, READ BACK BY AND VERIFIED WITH: N,CAMPELL AT [id number] ON 05/23/18 BY A,MOHAMED Performed at [facility] [address], [place], [place] [address]          Radiology Studies: Dg Chest 2 View  Result Date: 05/22/2018 CLINICAL DATA:  77 year old with current history of dementia with acute mental status changes associated with aggressive behavior. Recent fall. EXAM: CHEST - 2 VIEW COMPARISON:  04/07/2018. FINDINGS: Cardiac silhouette normal in size, unchanged. Thoracic aorta atherosclerotic. Hilar and mediastinal contours otherwise unremarkable. Lungs clear. Bronchovascular markings normal. Pulmonary vascularity normal. No visible pleural effusions. No pneumothorax. Mild degenerative changes involving the thoracic and upper lumbar spine. IMPRESSION: No acute cardiopulmonary  disease. Electronically Signed   By: Evangeline Dakin M.D.   On: 05/22/2018 12:50        Scheduled Meds: . aspirin EC  81 mg Oral BH-q7a  . buPROPion  300 mg Oral BH-q7a  . Chlorhexidine Gluconate Cloth  6 each Topical Q0600  . donepezil  10 mg Oral QHS  . enoxaparin (LOVENOX) injection  40 mg Subcutaneous Q24H  . insulin aspart  0-5 Units Subcutaneous QHS  . insulin aspart  0-9 Units Subcutaneous TID WC  . mupirocin ointment  1 application Nasal BID  . pravastatin  20 mg Oral q1800  . QUEtiapine  25 mg Oral QHS  . tamsulosin  0.4 mg Oral QPM  . traZODone  150 mg Oral QHS   Continuous Infusions: . sodium chloride 20 mL/hr (05/22/18 1318)  . sodium chloride 75 mL/hr at 05/23/18 1031  . cefTRIAXone (ROCEPHIN)  IV 1 g (05/23/18 1035)     LOS: 0 days    Time spent: 25 minutes.    Dana Levar, MD  [facility] Pager #: [phone] 7PM-7AM contact night coverage as above

## 2018-05-24 DIAGNOSIS — F0390 Unspecified dementia without behavioral disturbance: Secondary | ICD-10-CM | POA: Diagnosis present

## 2018-05-24 DIAGNOSIS — F0391 Unspecified dementia with behavioral disturbance: Secondary | ICD-10-CM

## 2018-05-24 LAB — URINE CULTURE: Culture: >=100000 — AB

## 2018-05-24 LAB — GLUCOSE, CAPILLARY
GLUCOSE-CAPILLARY: 229 mg/dL — AB (ref 70–99)
Glucose-Capillary: 168 mg/dL — ABNORMAL HIGH (ref 70–99)
Glucose-Capillary: 208 mg/dL — ABNORMAL HIGH (ref 70–99)
Glucose-Capillary: 216 mg/dL — ABNORMAL HIGH (ref 70–99)

## 2018-05-24 MED ORDER — MUPIROCIN 2 % EX OINT: 1.0000 "application " | TOPICAL_OINTMENT | Freq: Two times a day (BID) | CUTANEOUS | 0 refills | Status: AC

## 2018-05-24 MED ORDER — CIPROFLOXACIN HCL 500 MG PO TABS: 500.0000 mg | ORAL_TABLET | Freq: Two times a day (BID) | ORAL | 0 refills | Status: DC

## 2018-05-24 NOTE — Progress Notes (Signed)
Physical Therapy Treatment Patient Details Name: Hayden [name] MRN: [id number] DOB: 18-Oct-1940 Today's Date: 05/24/2018    History of Present Illness Pt is a 77 y.o. male with medical history significant of depression, dementia, diabetes, hyperlipidemia, BPH, and chronic lymphocytic leukemia. He was sent from assisted living facility for the evaluation of change in the mental status, aggressive behavior, and agitation.  Pt admitted with dx of UTI.     PT Comments    Pt cooperative and agreeable to mobilize and tolerated good distance of ambulation today.  If pt unable to return to ALF, may need SNF.   Follow Up Recommendations  SNF     Equipment Recommendations  Rolling walker with 5" wheels    Recommendations for Other Services       Precautions / Restrictions Precautions Precautions: Fall Precaution Comments: recent fall with laceration to head (sutured)    Mobility  Bed Mobility Overal bed mobility: Needs Assistance Bed Mobility: Supine to Sit     Supine to sit: Supervision;HOB elevated     General bed mobility comments: increased time and effort, supervision for safety  Transfers Overall transfer level: Needs assistance Equipment used: Rolling walker (2 wheeled) Transfers: Sit to/from Stand Sit to Stand: Min assist         General transfer comment: assist to rise and steady as well as control descent  Ambulation/Gait Ambulation/Gait assistance: Min guard Gait Distance (Feet): 400 Feet Assistive device: Rolling walker (2 wheeled) Gait Pattern/deviations: Step-through pattern;Decreased stride length Gait velocity: mildly decreased   General Gait Details: distance to tolerance, pt steady with RW   Stairs             Wheelchair Mobility    Modified Rankin (Stroke Patients Only)       Balance                                            Cognition Arousal/Alertness: Awake/alert Behavior During Therapy: WFL for tasks  assessed/performed Overall Cognitive Status: History of cognitive impairments - at baseline                                 General Comments: Reports and is aware of memory issues. Follows commands. Very pleasant and receptive to care.      Exercises      General Comments        Pertinent Vitals/Pain Pain Assessment: Faces Faces Pain Scale: Hurts little more Pain Location: groin/genital area Pain Descriptors / Indicators: Tender;Sore Pain Intervention(s): Monitored during session;Repositioned    Home Living                      Prior Function            PT Goals (current goals can now be found in the care plan section) Progress towards PT goals: Progressing toward goals    Frequency    Min 3X/week      PT Plan Current plan remains appropriate    Co-evaluation              AM-PAC PT "6 Clicks" Mobility   Outcome Measure  Help needed turning from your back to your side while in a flat bed without using bedrails?: None Help needed moving from lying on your back to sitting on the side  of a flat bed without using bedrails?: A Little Help needed moving to and from a bed to a chair (including a wheelchair)?: A Little Help needed standing up from a chair using your arms (e.g., wheelchair or bedside chair)?: A Little Help needed to walk in hospital room?: A Little Help needed climbing 3-5 steps with a railing? : A Lot 6 Click Score: 18    End of Session Equipment Utilized During Treatment: Gait belt Activity Tolerance: Patient tolerated treatment well Patient left: in chair;with call bell/phone within reach Nurse Communication: Mobility status(aware pt is up in recliner) PT Visit Diagnosis: Difficulty in walking, not elsewhere classified (R26.2);History of falling (Z91.81)     Time: 2993-7169 PT Time Calculation (min) (ACUTE ONLY): 22 min  Charges:  $Gait Training: 8-22 mins                     Carmelia Bake, PT, DPT Acute  Rehabilitation Services Office: [phone] Pager: [phone]  Trena Platt 05/24/2018, 3:27 PM

## 2018-05-24 NOTE — Progress Notes (Signed)
Clinical Social Worker following patient for support and discharge needs. patient is from [facility] and facility was requesting psychiatry to see patient before they can accept him. [facility] psychiatrist cleared patient to go back to ALF. Per facility they will be unable to take him today due to it being so late in the day. Facility stated they will be able to take him in the morning.   Rhea Pink, MSW,  [facility]

## 2018-05-24 NOTE — Consult Note (Signed)
[facility] Consult   Reason for Consult:  Agitation  Referring Physician:  Dr Duanne Limerick Patient Identification: Hayden [name] MRN:  [id number] Principal Diagnosis: UTI (urinary tract infection) Diagnosis:  Principal Problem:   UTI (urinary tract infection) Active Problems:   Dementia with behavioral disturbance (HCC)   CLL (chronic lymphocytic leukemia) ([place])   Type 2 diabetes mellitus with complication (HCC)   Altered mental status   Depression   BPH (benign prostatic hyperplasia)   Leukocytosis   Pressure injury of skin   Total Time spent with patient: 45 minutes  Subjective:   Hayden [name] is a 77 y.o. male patient reports he "made trouble at his facility" by being "sexual exploitative" towards male staff at the facility. Currently taking Seroquel 25 mg at bedtime for sleep, continue as he says he is a "chronic insomniac."  May start Depakote 250 mg BID for mood stabilization (appears to have some mood disturbances) and PRN Ativan 0.5 mg daily PRN anxiety/agitation.  Dr Jake Samples saw this patient with this practitioner and concurs with the plan.    HPI:  77 yo male who presented to the hospital with a recent fall and UTI which may have contributed to his agitation.  Patient engages easily in conversation and reports two head injuries which may have prompted his sexual disinhibition along with his diagnosis of dementia.  Hayden [name] does have insight into his issues and agreeable to follow the MD's orders.  No suicidal/homicidal ideations, hallucinations, or substance abuse on assessment.  Does reports seeing a sheet of colors in front of him at times that is not real as when he tries to touch it, it is not there.  No current visual distortions.  Stable to return to his facility.  Past Psychiatric History: dementia, depression  Risk to Self:  none Risk to Others:  none Prior Inpatient Therapy:  none Prior Outpatient Therapy:  none  Past Medical History:  Past Medical  History:  Diagnosis Date  . Depression   . Diabetes mellitus   . Enlarged prostate   . Fibromyalgia   . GERD (gastroesophageal reflux disease)    reflux intermittent  . Hyperlipidemia   . Leukemia ([place]) chronic lymphocytic   2008 diagnosed-monitored Dr     Past Surgical History:  Procedure Laterality Date  . ADENOIDECTOMY    . CARPAL TUNNEL RELEASE     rt hand  . CATARACT EXTRACTION, BILATERAL    . CERVICAL FUSION  1998  . CYSTOSCOPY  07/26/2012   Procedure: CYSTOSCOPY;  Surgeon: Ailene Rud, MD;  Location: [facility];  Service: Urology;  Laterality: N/A;  DIAGNOSTIC CYSTO PROSTATE ULTRASOUND      . PROSTATE BIOPSY  07/26/2012   Procedure: PROSTATE BIOPSY;  Surgeon: Ailene Rud, MD;  Location: [facility];  Service: Urology;  Laterality: N/A;  . retinal micro aneurysms    . tonsil    . VASECTOMY    . VITRECTOMY     Family History:  Family History  Problem Relation Age of Onset  . Diabetes Father   . Diabetes Sister   . Diabetes Brother    Family Psychiatric  History: none Social History:  Social History   Substance and Sexual Activity  Alcohol Use No  . Frequency: Never     Social History   Substance and Sexual Activity  Drug Use No    Social History   Socioeconomic History  . Marital status: Married    Spouse name:  Not on file  . Number of children: Not on file  . Years of education: Not on file  . Highest education level: Not on file  Occupational History  . Not on file  Social Needs  . Financial resource strain: Not on file  . Food insecurity:    Worry: Not on file    Inability: Not on file  . Transportation needs:    Medical: Not on file    Non-medical: Not on file  Tobacco Use  . Smoking status: Never Smoker  . Smokeless tobacco: Never Used  Substance and Sexual Activity  . Alcohol use: No    Frequency: Never  . Drug use: No  . Sexual activity: Not on file  Lifestyle  . Physical  activity:    Days per week: Not on file    Minutes per session: Not on file  . Stress: Not on file  Relationships  . Social connections:    Talks on phone: Not on file    Gets together: Not on file    Attends religious service: Not on file    Active member of club or organization: Not on file    Attends meetings of clubs or organizations: Not on file    Relationship status: Not on file  Other Topics Concern  . Not on file  Social History Narrative  . Not on file   Additional Social History:    Allergies:   Allergies  Allergen Reactions  . Bee Venom Other (See Comments)    Bump where stung and took days to go away  . Glucophage [Metformin] Diarrhea    "i will never take it again -stool incontinent "  . Shellfish Allergy Swelling  . Grass Extracts [Gramineae Pollens]   . Tobacco [Nicotiana Tabacum]     Labs:  Results for orders placed or performed during the hospital encounter of 05/22/18 (from the past 48 hour(s))  Culture, blood (routine x 2)     Status: None (Preliminary result)   Collection Time: 05/22/18  8:07 PM  Result Value Ref Range   Specimen Description      BLOOD RIGHT ANTECUBITAL Performed at [facility], [place] [address] [place], [place] [address]    Special Requests      BOTTLES DRAWN AEROBIC ONLY Blood Culture adequate volume Performed at [facility] [address] [place], [place] [address]    Culture      NO GROWTH 2 DAYS Performed at [facility], [place] [address] [place], [place] [address]    Report Status PENDING   Culture, blood (Routine X 2) w Reflex to ID Panel     Status: None (Preliminary result)   Collection Time: 05/22/18  8:07 PM  Result Value Ref Range   Specimen Description      BLOOD LEFT ANTECUBITAL Performed at [facility] [address] [place], [place] [address]    Special Requests      BOTTLES DRAWN AEROBIC ONLY Blood Culture adequate volume Performed at  [facility] [address], [place], [place] [address]    Culture      NO GROWTH 2 DAYS Performed at [facility], [place] [address], [place], [place] [address]    Report Status PENDING   Glucose, capillary     Status: Abnormal   Collection Time: 05/22/18  9:52 PM  Result Value Ref Range   Glucose-Capillary 217 (H) 70 - 99 mg/dL  MRSA PCR  Screening     Status: Abnormal   Collection Time: 05/23/18 12:25 AM  Result Value Ref Range   MRSA by PCR POSITIVE (A) NEGATIVE    Comment:        The GeneXpert MRSA Assay (FDA approved for NASAL specimens only), is one component of a comprehensive MRSA colonization surveillance program. It is not intended to diagnose MRSA infection nor to guide or monitor treatment for MRSA infections. RESULT CALLED TO, READ BACK BY AND VERIFIED WITH: N,CAMPELL AT 4944 ON 05/23/18 BY A,MOHAMED Performed at [facility] [address] [place], [place] [address]   CBC     Status: Abnormal   Collection Time: 05/23/18  4:41 AM  Result Value Ref Range   WBC 11.7 (H) 4.0 - 10.5 K/uL   RBC 4.22 4.22 - 5.81 MIL/uL   Hemoglobin 13.1 13.0 - 17.0 g/dL   HCT 40.6 39.0 - 52.0 %   MCV 96.2 80.0 - 100.0 fL   MCH 31.0 26.0 - 34.0 pg   MCHC 32.3 30.0 - 36.0 g/dL   RDW 13.2 11.5 - 15.5 %   Platelets 198 150 - 400 K/uL   nRBC 0.2 0.0 - 0.2 %    Comment: Performed at [facility], [place] [address] [place], [place] [address]  Basic metabolic panel     Status: Abnormal   Collection Time: 05/23/18  4:41 AM  Result Value Ref Range   Sodium 141 135 - 145 mmol/L   Potassium 3.7 3.5 - 5.1 mmol/L   Chloride 108 98 - 111 mmol/L   CO2 26 22 - 32 mmol/L   Glucose, Bld 122 (H) 70 - 99 mg/dL   BUN 20 8 - 23 mg/dL   Creatinine, Ser 0.79 0.61 - 1.24 mg/dL   Calcium 8.9 8.9 - 10.3 mg/dL   GFR calc non Af Amer >60 >60 mL/min   GFR calc Af Amer >60 >60 mL/min    Comment: (NOTE) The eGFR has been calculated using the  CKD EPI equation. This calculation has not been validated in all clinical situations. eGFR's persistently <60 mL/min signify possible Chronic Kidney Disease.    Anion gap 7 5 - 15    Comment: Performed at [facility], [place] [address], [place], [place] [address]  Glucose, capillary     Status: Abnormal   Collection Time: 05/23/18  7:34 AM  Result Value Ref Range   Glucose-Capillary 101 (H) 70 - 99 mg/dL  Glucose, capillary     Status: Abnormal   Collection Time: 05/23/18 11:41 AM  Result Value Ref Range   Glucose-Capillary 155 (H) 70 - 99 mg/dL  Glucose, capillary     Status: Abnormal   Collection Time: 05/23/18  5:17 PM  Result Value Ref Range   Glucose-Capillary 246 (H) 70 - 99 mg/dL  Glucose, capillary     Status: Abnormal   Collection Time: 05/23/18  9:07 PM  Result Value Ref Range   Glucose-Capillary 265 (H) 70 - 99 mg/dL  Glucose, capillary     Status: Abnormal   Collection Time: 05/24/18  7:43 AM  Result Value Ref Range   Glucose-Capillary 168 (H) 70 - 99 mg/dL  Glucose, capillary     Status: Abnormal   Collection Time: 05/24/18 12:09 PM  Result Value Ref Range   Glucose-Capillary 208 (H) 70 - 99 mg/dL    Current Facility-Administered Medications  Medication Dose Route Frequency Provider Last Rate Last Dose  . 0.9 %  sodium  chloride infusion   Intravenous Continuous Shelly Coss, MD 20 mL/hr at 05/22/18 1318 20 mL/hr at 05/22/18 1318  . 0.9 %  sodium chloride infusion   Intravenous Continuous Shelly Coss, MD 75 mL/hr at 05/24/18 1213    . aspirin EC tablet 81 mg  81 mg Oral Linard Millers, Tamsen Meek, MD   81 mg at 05/24/18 0858  . buPROPion (WELLBUTRIN XL) 24 hr tablet 300 mg  300 mg Oral Linard Millers, Tamsen Meek, MD   300 mg at 05/24/18 0858  . cefTRIAXone (ROCEPHIN) 1 g in sodium chloride 0.9 % 100 mL IVPB  1 g Intravenous Q24H Shelly Coss, MD 200 mL/hr at 05/24/18 0856 1 g at 05/24/18 0856  . Chlorhexidine Gluconate Cloth 2 % PADS 6 each  6  each Topical Q0600 Bonnell Public, MD   6 each at 05/24/18 [phone]  . donepezil (ARICEPT) tablet 10 mg  10 mg Oral QHS Shelly Coss, MD   10 mg at 05/23/18 2125  . enoxaparin (LOVENOX) injection 40 mg  40 mg Subcutaneous Q24H Shelly Coss, MD   40 mg at 05/23/18 2125  . HYDROcodone-acetaminophen (NORCO/VICODIN) 5-325 MG per tablet 1 tablet  1 tablet Oral Q6H PRN Adhikari, Amrit, MD      . insulin aspart (novoLOG) injection 0-5 Units  0-5 Units Subcutaneous QHS Shelly Coss, MD   3 Units at 05/23/18 2126  . insulin aspart (novoLOG) injection 0-9 Units  0-9 Units Subcutaneous TID WC Shelly Coss, MD   3 Units at 05/24/18 1212  . mupirocin ointment (BACTROBAN) 2 % 1 application  1 application Nasal BID Bonnell Public, MD   1 application at 70/96/28 [phone]  . pravastatin (PRAVACHOL) tablet 20 mg  20 mg Oral q1800 Shelly Coss, MD   20 mg at 05/23/18 1820  . QUEtiapine (SEROQUEL) tablet 25 mg  25 mg Oral QHS Shelly Coss, MD   25 mg at 05/23/18 2125  . tamsulosin (FLOMAX) capsule 0.4 mg  0.4 mg Oral QPM Shelly Coss, MD   0.4 mg at 05/23/18 1820  . traZODone (DESYREL) tablet 150 mg  150 mg Oral QHS Shelly Coss, MD   150 mg at 05/23/18 2125  . zinc oxide 20 % ointment   Topical PRN Shelly Coss, MD        Musculoskeletal: Strength & Muscle Tone: within normal limits Gait & Station: normal Patient leans: N/A  Psychiatric Specialty Exam: Physical Exam  Nursing note and vitals reviewed. Constitutional: He appears well-developed.  HENT:  Head: Normocephalic.  Neck: Normal range of motion.  Respiratory: Effort normal.  Musculoskeletal: Normal range of motion.  Neurological: He is alert.  Psychiatric: His behavior is normal. Thought content normal. His mood appears anxious. His speech is tangential. Cognition and memory are impaired. He expresses impulsivity.    Review of Systems  Psychiatric/Behavioral: Positive for memory loss. The patient is nervous/anxious.    All other systems reviewed and are negative.   Blood pressure (!) 121/57, pulse 83, temperature 98.4 F (36.9 C), temperature source Oral, resp. rate 18, height '5\' 8"'  (1.727 m), weight 64.9 kg, SpO2 98 %.Body mass index is 21.76 kg/m.  General Appearance: Casual  Eye Contact:  Good  Speech:  Normal Rate  Volume:  Normal  Mood:  Anxious  Affect:  Congruent  Thought Process:  Irrelevant and Descriptions of Associations: Tangential  Orientation:  Other:  person  Thought Content:  Logical  Suicidal Thoughts:  No  Homicidal Thoughts:  No  Memory:  Immediate;  Fair Recent;   Fair Remote;   Fair  Judgement:  Impaired  Insight:  Fair  Psychomotor Activity:  Normal  Concentration:  Concentration: Fair and Attention Span: Fair  Recall:  [place] of Knowledge:  Good  Language:  Good  Akathisia:  No  Handed:  Right  AIMS (if indicated):     Assets:  Leisure Time Resilience Social Support  ADL's:  Impaired  Cognition:  Impaired,  Mild  Sleep:        Treatment Plan Summary: Dementia with behavioral disturbance: -Continue Seroquel 25 mg at bedtime for sleep and mood -Consider started Depakote 250 mg BID for mood  -Consider Ativan 0.5 mg daily PRN anxiety/agitation  Disposition: No evidence of imminent risk to self or others at present.    Waylan Boga, NP 05/24/2018 4:13 PM

## 2018-05-24 NOTE — Progress Notes (Signed)
Inpatient Diabetes Program Recommendations  AACE/ADA: New Consensus Statement on Inpatient Glycemic Control (2015)  Target Ranges:  Prepandial:   less than 140 mg/dL      Peak postprandial:   less than 180 mg/dL (1-2 hours)      Critically ill patients:  140 - 180 mg/dL   Results for Hayden [name], Hayden [name] (MRN [id number]) as of 05/24/2018 12:11  Ref. Range 05/23/2018 07:34 05/23/2018 11:41 05/23/2018 17:17 05/23/2018 21:07  Glucose-Capillary Latest Ref Range: 70 - 99 mg/dL 101 (H) 155 (H)  2 units NOVOLOG  246 (H)  3 units NOVOLOG  265 (H)  3 units NOVOLOG    Results for Hayden [name], Hayden [name] (MRN [id number]) as of 05/24/2018 12:11  Ref. Range 05/24/2018 07:43 05/24/2018 12:09  Glucose-Capillary Latest Ref Range: 70 - 99 mg/dL 168 (H)  2 units NOVOLOG  208 (H)     ALF  M Meds: Levemir 36 units QHS              Humalog 4-10 units TID   Current Orders: Novolog Sensitive Correction Scale/ SSI (0-9 units) TID AC + HS      MD- Please consider increasing Novolog SSI to Moderate scale (0-15 units) TID AC + HS      --Will follow patient during hospitalization--  Wyn Quaker RN, MSN, CDE Diabetes Coordinator Inpatient Glycemic Control Team Team Pager: [phone] (8a-5p)

## 2018-05-25 DIAGNOSIS — C9110 Chronic lymphocytic leukemia of B-cell type not having achieved remission: Secondary | ICD-10-CM

## 2018-05-25 DIAGNOSIS — D72829 Elevated white blood cell count, unspecified: Secondary | ICD-10-CM

## 2018-05-25 DIAGNOSIS — N401 Enlarged prostate with lower urinary tract symptoms: Secondary | ICD-10-CM

## 2018-05-25 LAB — GLUCOSE, CAPILLARY
Glucose-Capillary: 160 mg/dL — ABNORMAL HIGH (ref 70–99)
Glucose-Capillary: 175 mg/dL — ABNORMAL HIGH (ref 70–99)

## 2018-05-25 MED ORDER — CEPHALEXIN 500 MG PO CAPS
500.0000 mg | ORAL_CAPSULE | Freq: Three times a day (TID) | ORAL | Status: DC
  Administered 2018-05-25: 500 mg via ORAL
  Filled 2018-05-25: qty 1

## 2018-05-25 MED ORDER — CEPHALEXIN 500 MG PO CAPS: 500.0000 mg | ORAL_CAPSULE | Freq: Three times a day (TID) | ORAL | 0 refills | Status: AC

## 2018-05-25 NOTE — Progress Notes (Signed)
Attempted to call report to [facility], spoke with Lovey Newcomer, asked to speak with med tech or Angela Nevin per CSW note, was transferred, when phone answered the person said hold on and I will transfer you to the front office and was disconnected, [facility] here to transport patient, will attempt to call back after report given to transporters

## 2018-05-25 NOTE — Discharge Summary (Signed)
Physician Discharge Summary  Patient ID: Hayden [name] MRN: [id number] DOB/AGE: 1940-12-08 77 y.o.  Admit date: 05/22/2018 Discharge date: 05/25/2018  Admission Diagnoses:  Discharge Diagnoses:  Principal Problem:   UTI (urinary tract infection) Active Problems:   CLL (chronic lymphocytic leukemia) (HCC)   Type 2 diabetes mellitus with complication (HCC)   Altered mental status   Depression   BPH (benign prostatic hyperplasia)   Leukocytosis   Pressure injury of skin   Dementia with behavioral disturbance ([place])   Discharged Condition: stable  Hospital Course:  Hayden [name] a 77 y.o.malewith medical history significant ofdepression, dementia,diabetes, hyperlipidemia, BPH, chronic lymphocytic leukemia who was sent from assisted living facility for the evaluation of change in the mental status, aggressive behavior, agitation. Nursing staff at the facility felt that his agitation was due to urine tract infection as he had history of urine tract infection in the past.  UA done in the emergency department was suggestive of tract infection.  Urine culture is growing Klebsiella pneumonia.  Repeat CT head is nonrevealing.  Follow final urine cultures.  Pursue disposition in the next 1 to 2 days.  UTI:  History of Klebsiella UTI in the past sensitive to ceftriaxone.  Repeat urine culture grew Klebsiella pneumonia.   Patient was treated with IV Rocephin during the hospital stay.   Patient will be discharged back home on Keflex.    Altered mental status:  Patient's behavior was quite manageable during the hospital stay.   As per assisted living facilities request, psychiatric team was asked to see patient in consultation.   Patient will need to follow with psychiatric team on discharge.    Depression:  Continue current medications.    Dementia:  Resident of assisted living facility.  On donepezil. Follow with psychiatry. There may be element of disinhibition to the  psychopathology of his dementia.  Type 2 diabetes mellitus:  On insulinat the assistedliving facility.  Continue to monitor closely.    BPH:  Continue tamsulosin  CLL:  Currently in remission.  Not receiving any treatment currently.  Patient follows with oncology as an outpatient on as-needed basis.  Frequent falls:  -Patient has been falling frequently at the assisted living facility.  -Ambulates with a cane.  -Continue physical therapy assessment on discharge.   -If patient continues to fall, patient may need higher level of care, as per physical therapy team.   -With history of falls and incontinence, PCP should have a low threshold to proceed with MRI of the brain to rule out normal pressure hydrocephalus.    Hyperlipidemia: Continue statin  Consults: psychiatry  Significant Diagnostic Studies: microbiology: urine culture: positive for Klebsiella pneumonia  Discharge Exam: Blood pressure (!) 152/68, pulse 71, temperature 97.9 F (36.6 C), temperature source Oral, resp. rate 16, height 5\' 8"  (1.727 m), weight 64.9 kg, SpO2 99 %.   Disposition: Discharge disposition: 01-Home or Self Care  Discharge Instructions    Diet - low sodium heart healthy   Complete by:  As directed    Diet - low sodium heart healthy   Complete by:  As directed    Diet Carb Modified   Complete by:  As directed    Diet Carb Modified   Complete by:  As directed    Increase activity slowly   Complete by:  As directed    Increase activity slowly   Complete by:  As directed      Allergies as of 05/25/2018      Reactions  Bee Venom Other (See Comments)   Bump where stung and took days to go away   Glucophage [metformin] Diarrhea   "i will never take it again -stool incontinent "   Shellfish Allergy Swelling   Grass Extracts [gramineae Pollens]    Tobacco [nicotiana Tabacum]       Medication List    STOP taking these medications   hydrOXYzine 25 MG tablet Commonly  known as:  ATARAX/VISTARIL   naproxen sodium 220 MG tablet Commonly known as:  ALEVE     TAKE these medications   aspirin 81 MG tablet Take 81 mg by mouth every morning.   buPROPion 300 MG 24 hr tablet Commonly known as:  WELLBUTRIN XL Take 300 mg by mouth every morning.   cephALEXin 500 MG capsule Commonly known as:  KEFLEX Take 1 capsule (500 mg total) by mouth every 8 (eight) hours for 3 days.   cetirizine 10 MG tablet Commonly known as:  ZYRTEC Take 10 mg by mouth daily as needed for allergies.   DIMETHICONE-ZINC OXIDE EX Apply 1 application topically See admin instructions. Apply topically to buttocks and scrotum after each incontinence episode   donepezil 10 MG tablet Commonly known as:  ARICEPT Take 1 tablet (10 mg total) by mouth at bedtime.   EPINEPHrine 0.15 MG/0.3ML injection Commonly known as:  EPIPEN JR Inject 0.15 mg into the muscle as needed for anaphylaxis.   fish oil-omega-3 fatty acids 1000 MG capsule Take 1 g by mouth 2 (two) times daily.   HUMALOG KWIKPEN 100 UNIT/ML KiwkPen Generic drug:  insulin lispro Inject 4-10 Units into the skin 3 (three) times daily before meals.   HYDROcodone-acetaminophen 5-325 MG tablet Commonly known as:  NORCO/VICODIN Take 1 tablet by mouth every 6 (six) hours as needed for moderate pain.   LEVEMIR FLEXTOUCH 100 UNIT/ML Pen Generic drug:  Insulin Detemir Inject 36 Units into the skin at bedtime.   lovastatin 20 MG tablet Commonly known as:  MEVACOR Take 20 mg by mouth daily.   mupirocin ointment 2 % Commonly known as:  BACTROBAN Place 1 application into the nose 2 (two) times daily for 5 days.   neomycin-bacitracin-polymyxin Oint Commonly known as:  NEOSPORIN Apply 1 application topically as needed for wound care (left shoulder).   nystatin cream Commonly known as:  MYCOSTATIN Apply to affected area 2 times daily What changed:    how much to take  how to take this  when to take this  additional  instructions   QUEtiapine 25 MG tablet Commonly known as:  SEROQUEL Take 1/2 tablet in AM, 1 tablet in PM What changed:    how much to take  how to take this  when to take this   tamsulosin 0.4 MG Caps capsule Commonly known as:  FLOMAX Take 0.4 mg by mouth every evening.   traZODone 100 MG tablet Commonly known as:  DESYREL Take 150 mg by mouth at bedtime.   vitamin B-12 1000 MCG tablet Commonly known as:  CYANOCOBALAMIN Take 1,000 mcg by mouth every morning.   Vitamin D3 25 MCG (1000 UT) Caps Take 1,000 Units by mouth daily after breakfast.      Time spent: 32 minutes.  SignedBonnell Public 05/25/2018, 11:51 AM

## 2018-05-25 NOTE — Consult Note (Addendum)
   [facility] CM Inpatient Consult   05/25/2018  Hayden [name] [birth year] [id number]    Patient screened for potential [facility] Care Management services due to unplanned readmission risk score of 24% (high) and multiple hospitalizations.  Chart reviewed. Noted [name] discharge plan is for ALF. No identifiable [facility] Care Management needs at this time.   Marthenia Rolling, MSN-Ed, RN,BSN [facility] Liaison [phone]

## 2018-05-25 NOTE — Progress Notes (Signed)
Clinical Social Worker facilitated patient discharge including contacting patient family and facility to confirm patient discharge plans.  Clinical information faxed to facility and family agreeable with plan.  CSW arranged ambulance transport via [facility] to [facility].  RN to call [phone] (ask for Med Tech or Angela Nevin) for report prior to discharge.  Clinical Social Worker will sign off for now as social work intervention is no longer needed. Please consult [place] again if new need arises.  Rhea Pink, MSW, [place]

## 2018-05-25 NOTE — NC FL2 (Signed)
[place] LEVEL OF CARE SCREENING TOOL     IDENTIFICATION  Patient Name: Hayden [name] Birthdate: [birth year] Sex: male Admission Date (Current Location): 05/22/2018  [facility] and [place] Number:  Herbalist and Address:  [facility],  [place] [address] [place]      Provider Number: [id number]  Attending Physician Name and Address:  Bonnell Public, MD  Relative Name and Phone Number:       Current Level of Care: Hospital Recommended Level of Care: [place] Prior Approval Number:    Date Approved/Denied:   PASRR Number:    Discharge Plan: Home([facility])    Current Diagnoses: Patient Active Problem List   Diagnosis Date Noted  . Dementia with behavioral disturbance ([place]) 05/24/2018  . Pressure injury of skin 05/23/2018  . Cellulitis 04/17/2018  . Cellulitis, scrotum 03/09/2018  . Cellulitis of male genitalia 03/08/2018  . Depression 03/08/2018  . BPH (benign prostatic hyperplasia) 03/08/2018  . Leukocytosis 03/08/2018  . Mild dementia ([place]) 10/23/2017  . UTI (urinary tract infection) 09/06/2017  . Hyperglycemia 09/06/2017  . ARF (acute renal failure) ([place]) 09/06/2017  . Altered mental status 09/06/2017  . Type 2 diabetes mellitus with complication ([place]) 88/50/2774  . CLL (chronic lymphocytic leukemia) ([place]) 09/01/2013    Orientation RESPIRATION BLADDER Height & Weight     Self, Time, Situation, Place  Normal Continent Weight: 143 lb 1.3 oz (64.9 kg) Height:  5\' 8"  (172.7 cm)  BEHAVIORAL SYMPTOMS/MOOD NEUROLOGICAL BOWEL NUTRITION STATUS      Continent Diet(carb modified)  AMBULATORY STATUS COMMUNICATION OF NEEDS Skin   Limited Assist Verbally Normal                       Personal Care Assistance Level of Assistance  Bathing, Feeding, Dressing Bathing Assistance: Limited assistance Feeding assistance: Independent Dressing Assistance: Limited assistance     Functional  Limitations Info  Sight, Hearing, Speech Sight Info: Adequate Hearing Info: Adequate Speech Info: Adequate    SPECIAL CARE FACTORS FREQUENCY  PT (By licensed PT), OT (By licensed OT)     PT Frequency: 3x home health OT Frequency: 3x home health            Contractures Contractures Info: Not present    Additional Factors Info  Code Status, Allergies Code Status Info: dnr Allergies Info: BEE VENOM, GLUCOPHAGE METFORMIN, SHELLFISH ALLERGY, GRASS EXTRACTS GRAMINEAE POLLENS, TOBACCO NICOTIANA TABACUM            Current Medications (05/25/2018):  This is the current hospital active medication list Current Facility-Administered Medications  Medication Dose Route Frequency Provider Last Rate Last Dose  . 0.9 %  sodium chloride infusion   Intravenous Continuous Shelly Coss, MD 20 mL/hr at 05/22/18 1318 20 mL/hr at 05/22/18 1318  . 0.9 %  sodium chloride infusion   Intravenous Continuous Shelly Coss, MD 75 mL/hr at 05/24/18 1213    . aspirin EC tablet 81 mg  81 mg Oral Linard Millers, Amrit, MD   81 mg at 05/25/18 0900  . buPROPion (WELLBUTRIN XL) 24 hr tablet 300 mg  300 mg Oral Linard Millers, Amrit, MD   300 mg at 05/25/18 0900  . cephALEXin (KEFLEX) capsule 500 mg  500 mg Oral Q8H Dana Paddy I, MD   500 mg at 05/25/18 0902  . Chlorhexidine Gluconate Cloth 2 % PADS 6 each  6 each Topical Q0600 Bonnell Public, MD   6 each at  05/25/18 0557  . donepezil (ARICEPT) tablet 10 mg  10 mg Oral QHS Shelly Coss, MD   10 mg at 05/24/18 2125  . enoxaparin (LOVENOX) injection 40 mg  40 mg Subcutaneous Q24H Shelly Coss, MD   40 mg at 05/24/18 2125  . HYDROcodone-acetaminophen (NORCO/VICODIN) 5-325 MG per tablet 1 tablet  1 tablet Oral Q6H PRN Shelly Coss, MD   1 tablet at 05/25/18 0156  . insulin aspart (novoLOG) injection 0-5 Units  0-5 Units Subcutaneous QHS Shelly Coss, MD   2 Units at 05/24/18 2123  . insulin aspart (novoLOG) injection 0-9 Units  0-9 Units  Subcutaneous TID WC Shelly Coss, MD   2 Units at 05/25/18 0859  . mupirocin ointment (BACTROBAN) 2 % 1 application  1 application Nasal BID Bonnell Public, MD   1 application at 26/83/41 0902  . pravastatin (PRAVACHOL) tablet 20 mg  20 mg Oral q1800 Shelly Coss, MD   20 mg at 05/24/18 1653  . QUEtiapine (SEROQUEL) tablet 25 mg  25 mg Oral QHS Shelly Coss, MD   25 mg at 05/24/18 2125  . tamsulosin (FLOMAX) capsule 0.4 mg  0.4 mg Oral QPM Shelly Coss, MD   0.4 mg at 05/24/18 1653  . traZODone (DESYREL) tablet 150 mg  150 mg Oral QHS Shelly Coss, MD   150 mg at 05/25/18 0155  . zinc oxide 20 % ointment   Topical PRN Shelly Coss, MD         Discharge Medications: Please see discharge summary for a list of discharge medications.  Relevant Imaging Results:  Relevant Lab Results:   Additional Information SSN   [id number]  Wende Neighbors, LCSW

## 2018-05-25 NOTE — Progress Notes (Signed)
PROGRESS NOTE    Hayden [name]  TIR:[id number] DOB: 09/13/1940 DOA: 05/22/2018 PCP: Patient, No Pcp Per  Outpatient Specialists:   Brief Narrative:  Hayden [name] is a 77 y.o. male with medical history significant of depression, dementia, diabetes, hyperlipidemia, BPH, chronic lymphocytic leukemia who was sent from assisted living facility for the evaluation of change in the mental status, aggressive behavior, agitation.  Nursing staff at the facility felt that his agitation was due to urine tract infection as he had history of urine tract infection in the past.  UA done in the emergency department was suggestive of tract infection.  Urine culture is growing Klebsiella pneumonia.  Repeat CT head is nonrevealing.  Follow final urine cultures.  Pursue disposition in the next 1 to 2 days.  05/24/2018: Patient seen.  No new complaints.  We will pursue disposition.  Assessment & Plan:   Principal Problem:   UTI (urinary tract infection) Active Problems:   CLL (chronic lymphocytic leukemia) (HCC)   Type 2 diabetes mellitus with complication (HCC)   Altered mental status   Depression   BPH (benign prostatic hyperplasia)   Leukocytosis   Pressure injury of skin   Dementia with behavioral disturbance (HCC)   UTI:  History of Klebsiella UTI in the past sensitive to ceftriaxone.   Presented with incontinence. Denies any dysuria.   Urine culture, blood culture has been sent.   We will continue IV antibiotic for now.   05/23/2018: Repeat urine culture is growing Klebsiella pneumonia. 05/24/2018: With discharge patient on oral antibiotics.  Altered mental status:  During my evaluation he was completely alert and oriented.  He has history of dementia.  As per the staff from ALF, he was very agitated and altered. Patient is on Seroquel at night.  We will continue to monitor his mental status.  He is high risk for sundowning/delirium. 05/23/2018: Continue to monitor. 05/24/2018: Patient has  remained stable.  Await psychiatric input.  Depression:  We will resume his home medications.  Current his mood is looks stable.   Dementia:  Resident of assisted living facility.  On donepezil.  Type 2 diabetes mellitus:  On insulin at the assisted  living facility.  We will continue to monitor his blood sugars.  Continue sliding-scale insulin here.  BPH:  Continue tamsulosin  CLL:  Currently in remission.  Not receiving any treatment currently.  He follows with oncology as an outpatient on as-needed basis.  Frequent falls:  Has been falling frequently at the assisted living facility.  Ambulates with a cane.  Will request for PT evaluation.  Hyperlipidemia:  Continue statin  Severity of Illness: The appropriate patient status for this patient is OBSERVATION  DVT prophylaxis: Lovenox Code Status: Full Family Communication: None present at the bedside Consults called: None  Procedures:   None  Antimicrobials:   IV Rocephin.   Subjective: No new complaints.  No fever chills.  Objective: Vitals:   05/24/18 0535 05/24/18 1408 05/24/18 2041 05/25/18 0428  BP: (!) 118/58 (!) 121/57 131/73 (!) 152/68  Pulse: 70 83 77 71  Resp: 18 18 16 16   Temp: 98.2 F (36.8 C) 98.4 F (36.9 C) 99 F (37.2 C) 97.9 F (36.6 C)  TempSrc: Oral Oral Oral Oral  SpO2: 97% 98% 98% 99%  Weight:      Height:        Intake/Output Summary (Last 24 hours) at 05/25/2018 6761 Last data filed at 05/25/2018 0649 Gross per 24 hour  Intake 120 ml  Output 1525 ml  Net -1405 ml   Filed Weights   05/22/18 1855  Weight: 64.9 kg    Examination:  General exam: Appears calm and comfortable  Respiratory system: Clear to auscultation. Respiratory effort normal. Cardiovascular system: S1 & S2 heard, RRR. No JVD, murmurs, rubs, gallops or clicks. No pedal edema. Gastrointestinal system: Abdomen is nondistended, soft and nontender. No organomegaly or masses felt. Normal bowel sounds  heard. Central nervous system: Alert and oriented. No focal neurological deficits. Extremities: Symmetric 5 x 5 power. Skin: No rashes, lesions or ulcers Psychiatry: Judgement and insight appear normal. Mood & affect appropriate.     Data Reviewed: I have personally reviewed following labs and imaging studies  CBC: Recent Labs  Lab 05/22/18 1532 05/23/18 0441  WBC 14.8* 11.7*  NEUTROABS 5.1  --   HGB 13.6 13.1  HCT 41.3 40.6  MCV 94.9 96.2  PLT 201 643   Basic Metabolic Panel: Recent Labs  Lab 05/22/18 1309 05/23/18 0441  NA 140 141  K 4.1 3.7  CL 104 108  CO2 26 26  GLUCOSE 83 122*  BUN 26* 20  CREATININE 0.82 0.79  CALCIUM 9.6 8.9   GFR: Estimated Creatinine Clearance: 69.9 mL/min (by C-G formula based on SCr of 0.79 mg/dL). Liver Function Tests: Recent Labs  Lab 05/22/18 1309  AST 30  ALT 37  ALKPHOS 57  BILITOT 1.1  PROT 6.8  ALBUMIN 4.2   No results for input(s): LIPASE, AMYLASE in the last 168 hours. No results for input(s): AMMONIA in the last 168 hours. Coagulation Profile: No results for input(s): INR, PROTIME in the last 168 hours. Cardiac Enzymes: No results for input(s): CKTOTAL, CKMB, CKMBINDEX, TROPONINI in the last 168 hours. BNP (last 3 results) No results for input(s): PROBNP in the last 8760 hours. HbA1C: No results for input(s): HGBA1C in the last 72 hours. CBG: Recent Labs  Lab 05/24/18 0743 05/24/18 1209 05/24/18 1652 05/24/18 2038 05/25/18 0758  GLUCAP 168* 208* 229* 216* 160*   Lipid Profile: No results for input(s): CHOL, HDL, LDLCALC, TRIG, CHOLHDL, LDLDIRECT in the last 72 hours. Thyroid Function Tests: No results for input(s): TSH, T4TOTAL, FREET4, T3FREE, THYROIDAB in the last 72 hours. Anemia Panel: No results for input(s): VITAMINB12, FOLATE, FERRITIN, TIBC, IRON, RETICCTPCT in the last 72 hours. Urine analysis:    Component Value Date/Time   COLORURINE YELLOW 05/22/2018 1204   APPEARANCEUR HAZY (A)  05/22/2018 1204   LABSPEC 1.014 05/22/2018 1204   PHURINE 8.0 05/22/2018 1204   GLUCOSEU NEGATIVE 05/22/2018 1204   HGBUR SMALL (A) 05/22/2018 1204   BILIRUBINUR NEGATIVE 05/22/2018 1204   KETONESUR 5 (A) 05/22/2018 1204   PROTEINUR NEGATIVE 05/22/2018 1204   NITRITE NEGATIVE 05/22/2018 1204   LEUKOCYTESUR MODERATE (A) 05/22/2018 1204   Sepsis Labs: @LABRCNTIP (procalcitonin:4,lacticidven:4)  ) Recent Results (from the past 240 hour(s))  Urine Culture     Status: Abnormal   Collection Time: 05/22/18 12:04 PM  Result Value Ref Range Status   Specimen Description   Final    URINE, CLEAN CATCH Performed at [facility], [place] [address] [place], [place] [address]    Special Requests   Final    NONE Performed at [facility], [place] [address] [place], [place] [address]    Culture >=100,000 COLONIES/mL KLEBSIELLA PNEUMONIAE (A)  Final   Report Status 05/24/2018 FINAL  Final   Organism ID, Bacteria KLEBSIELLA PNEUMONIAE (A)  Final      Susceptibility  Klebsiella pneumoniae - MIC*    AMPICILLIN >=32 RESISTANT Resistant     CEFAZOLIN <=4 SENSITIVE Sensitive     CEFTRIAXONE <=1 SENSITIVE Sensitive     CIPROFLOXACIN <=0.25 SENSITIVE Sensitive     GENTAMICIN <=1 SENSITIVE Sensitive     IMIPENEM <=0.25 SENSITIVE Sensitive     NITROFURANTOIN 256 RESISTANT Resistant     TRIMETH/SULFA <=20 SENSITIVE Sensitive     AMPICILLIN/SULBACTAM 4 SENSITIVE Sensitive     PIP/TAZO <=4 SENSITIVE Sensitive     Extended ESBL NEGATIVE Sensitive     * >=100,000 COLONIES/mL KLEBSIELLA PNEUMONIAE  Culture, blood (routine x 2)     Status: None (Preliminary result)   Collection Time: 05/22/18  8:07 PM  Result Value Ref Range Status   Specimen Description   Final    BLOOD RIGHT ANTECUBITAL Performed at [facility] [address], [place], [place] [address]    Special Requests   Final    BOTTLES DRAWN AEROBIC ONLY Blood Culture adequate  volume Performed at [facility] [address], [place], [place] [address]    Culture   Final    NO GROWTH 3 DAYS Performed at [facility], [place] [address], [place], [place] [address]    Report Status PENDING  Incomplete  Culture, blood (Routine X 2) w Reflex to ID Panel     Status: None (Preliminary result)   Collection Time: 05/22/18  8:07 PM  Result Value Ref Range Status   Specimen Description   Final    BLOOD LEFT ANTECUBITAL Performed at [facility] [address], [place], [place] [address]    Special Requests   Final    BOTTLES DRAWN AEROBIC ONLY Blood Culture adequate volume Performed at [facility] [address] [place], [place] [address]    Culture   Final    NO GROWTH 3 DAYS Performed at [facility], [place] [address], [place], [place] [address]    Report Status PENDING  Incomplete  MRSA PCR Screening     Status: Abnormal   Collection Time: 05/23/18 12:25 AM  Result Value Ref Range Status   MRSA by PCR POSITIVE (A) NEGATIVE Final    Comment:        The GeneXpert MRSA Assay (FDA approved for NASAL specimens only), is one component of a comprehensive MRSA colonization surveillance program. It is not intended to diagnose MRSA infection nor to guide or monitor treatment for MRSA infections. RESULT CALLED TO, READ BACK BY AND VERIFIED WITH: N,CAMPELL AT 0932 ON 05/23/18 BY A,MOHAMED Performed at [facility] [address], [place],  [address]          Radiology Studies: Ct Head Wo Contrast  Result Date: 05/23/2018 CLINICAL DATA:  Encephalopathy EXAM: CT HEAD WITHOUT CONTRAST TECHNIQUE: Contiguous axial images were obtained from the base of the skull through the vertex without intravenous contrast. COMPARISON:  Six days ago FINDINGS: Brain: No evidence of acute infarction, hemorrhage, hydrocephalus, extra-axial collection or mass lesion/mass effect.  Generalized cortical atrophy. Vascular: Atherosclerotic calcification Skull: Normal. Negative for fracture or focal lesion. Sinuses/Orbits: Bilateral cataract resection.  No pathologic finding IMPRESSION: No acute or reversible finding. Generalized atrophy. Electronically Signed   By: Monte Fantasia M.D.   On: 05/23/2018 14:55        Scheduled Meds: . aspirin EC  81 mg Oral BH-q7a  . buPROPion  300 mg Oral BH-q7a  . Chlorhexidine Gluconate Cloth  6  each Topical Q0600  . donepezil  10 mg Oral QHS  . enoxaparin (LOVENOX) injection  40 mg Subcutaneous Q24H  . insulin aspart  0-5 Units Subcutaneous QHS  . insulin aspart  0-9 Units Subcutaneous TID WC  . mupirocin ointment  1 application Nasal BID  . pravastatin  20 mg Oral q1800  . QUEtiapine  25 mg Oral QHS  . tamsulosin  0.4 mg Oral QPM  . traZODone  150 mg Oral QHS   Continuous Infusions: . sodium chloride 20 mL/hr (05/22/18 1318)  . sodium chloride 75 mL/hr at 05/24/18 1213  . cefTRIAXone (ROCEPHIN)  IV 1 g (05/24/18 0856)      Time spent: 25 minutes.    Dana Nil, MD  [facility] Pager #: [phone] 7PM-7AM contact night coverage as above

## 2018-05-25 NOTE — Clinical Social Work Note (Signed)
Clinical Social Work Assessment  Patient Details  Name: Hayden [name] MRN: [id number] Date of Birth: [birth year]  Date of referral:  05/25/18               Reason for consult:  Discharge Planning                Permission sought to share information with:  Family Supports Permission granted to share information::  Yes, Verbal Permission Granted  Name::     Rodena Piety   Agency::  [facility]  Relationship::  sister  Contact Information:  [phone]  Housing/Transportation Living arrangements for the past 2 months:  [place] of Information:  Patient Patient Interpreter Needed:  None Criminal Activity/Legal Involvement Pertinent to Current Situation/Hospitalization:  No - Comment as needed Significant Relationships:  Adult Children, Siblings, Community Support Lives with:  Facility Resident Do you feel safe going back to the place where you live?  Yes Need for family participation in patient care:  Yes (Comment)  Care giving concerns:  No family at bedside. Patient is from [place]   Social Worker assessment / plan:  CSW met patient at bedside to discuss discharge plans. Patient stated he is ready to get back home but stated he thought he would be able to stay longer at the hospital. Patient stated he is still agreeable to go back to [facility].  CSW had spoken with [facility] and they stated they are agreeable to take patient back but requested that MD please write an order for psychiatry   to follow patient at the facility. CSW relayed information dn MD stated he will write order.  Employment status:  Retired Forensic scientist:  Medicare PT Recommendations:  [place] / Referral to community resources:  Other (Comment Required)  Patient/Family's Response to care:  Patient very pleasant and understanding during assessment   Patient/Family's Understanding of and Emotional Response to Diagnosis, Current  Treatment, and Prognosis:  Patient agreeable to go back to facility  Emotional Assessment Appearance:  Appears stated age Attitude/Demeanor/Rapport:  Engaged Affect (typically observed):  Accepting Orientation:  Oriented to Self, Oriented to Place, Oriented to  Time, Oriented to Situation Alcohol / Substance use:  Not Applicable Psych involvement (Current and /or in the community):     Discharge Needs  Concerns to be addressed:  No discharge needs identified Readmission within the last 30 days:  No Current discharge risk:  None Barriers to Discharge:  Continued Medical Work up   [employer], LCSW 05/25/2018, 10:32 AM

## 2018-05-26 DIAGNOSIS — B961 Klebsiella pneumoniae [K. pneumoniae] as the cause of diseases classified elsewhere: Secondary | ICD-10-CM | POA: Diagnosis not present

## 2018-05-26 DIAGNOSIS — F418 Other specified anxiety disorders: Secondary | ICD-10-CM | POA: Diagnosis not present

## 2018-05-26 DIAGNOSIS — E1142 Type 2 diabetes mellitus with diabetic polyneuropathy: Secondary | ICD-10-CM | POA: Diagnosis not present

## 2018-05-26 DIAGNOSIS — N390 Urinary tract infection, site not specified: Secondary | ICD-10-CM | POA: Diagnosis not present

## 2018-05-26 DIAGNOSIS — C9110 Chronic lymphocytic leukemia of B-cell type not having achieved remission: Secondary | ICD-10-CM | POA: Diagnosis not present

## 2018-05-26 DIAGNOSIS — F0390 Unspecified dementia without behavioral disturbance: Secondary | ICD-10-CM | POA: Diagnosis not present

## 2018-05-27 ENCOUNTER — Emergency Department (HOSPITAL_COMMUNITY)
Admission: EM | Admit: 2018-05-27 | Discharge: 2018-05-27 | Disposition: A | Discharge status: Home / Self Care | Payer: Medicare Other | Attending: Emergency Medicine | Admitting: Emergency Medicine

## 2018-05-27 ENCOUNTER — Emergency Department (HOSPITAL_COMMUNITY): Payer: Medicare Other

## 2018-05-27 ENCOUNTER — Other Ambulatory Visit: Payer: Self-pay

## 2018-05-27 ENCOUNTER — Encounter (HOSPITAL_COMMUNITY): Payer: Self-pay | Admitting: *Deleted

## 2018-05-27 DIAGNOSIS — I959 Hypotension, unspecified: Secondary | ICD-10-CM | POA: Diagnosis not present

## 2018-05-27 DIAGNOSIS — E119 Type 2 diabetes mellitus without complications: Secondary | ICD-10-CM | POA: Diagnosis not present

## 2018-05-27 DIAGNOSIS — Z856 Personal history of leukemia: Secondary | ICD-10-CM | POA: Insufficient documentation

## 2018-05-27 DIAGNOSIS — F528 Other sexual dysfunction not due to a substance or known physiological condition: Secondary | ICD-10-CM

## 2018-05-27 DIAGNOSIS — F0390 Unspecified dementia without behavioral disturbance: Secondary | ICD-10-CM | POA: Diagnosis not present

## 2018-05-27 DIAGNOSIS — R102 Pelvic and perineal pain: Secondary | ICD-10-CM | POA: Diagnosis present

## 2018-05-27 DIAGNOSIS — M2550 Pain in unspecified joint: Secondary | ICD-10-CM | POA: Diagnosis not present

## 2018-05-27 DIAGNOSIS — L03325 Acute lymphangitis of perineum: Secondary | ICD-10-CM | POA: Diagnosis not present

## 2018-05-27 DIAGNOSIS — I700 Atherosclerosis of aorta: Secondary | ICD-10-CM | POA: Diagnosis not present

## 2018-05-27 DIAGNOSIS — L304 Erythema intertrigo: Secondary | ICD-10-CM | POA: Insufficient documentation

## 2018-05-27 DIAGNOSIS — Z794 Long term (current) use of insulin: Secondary | ICD-10-CM | POA: Insufficient documentation

## 2018-05-27 DIAGNOSIS — Z79899 Other long term (current) drug therapy: Secondary | ICD-10-CM | POA: Diagnosis not present

## 2018-05-27 DIAGNOSIS — Z7401 Bed confinement status: Secondary | ICD-10-CM | POA: Diagnosis not present

## 2018-05-27 DIAGNOSIS — R52 Pain, unspecified: Secondary | ICD-10-CM | POA: Diagnosis not present

## 2018-05-27 DIAGNOSIS — L0390 Cellulitis, unspecified: Secondary | ICD-10-CM | POA: Diagnosis not present

## 2018-05-27 HISTORY — DX: Other sexual dysfunction not due to a substance or known physiological condition: F52.8

## 2018-05-27 LAB — CBC WITH DIFFERENTIAL/PLATELET
ABS IMMATURE GRANULOCYTES: 0.06 10*3/uL (ref 0.00–0.07)
BASOS PCT: 0 %
Basophils Absolute: 0 10*3/uL (ref 0.0–0.1)
Eosinophils Absolute: 0.2 10*3/uL (ref 0.0–0.5)
Eosinophils Relative: 2 %
HCT: 40.3 % (ref 39.0–52.0)
Hemoglobin: 13.2 g/dL (ref 13.0–17.0)
Immature Granulocytes: 1 %
Lymphocytes Relative: 43 %
Lymphs Abs: 5.4 10*3/uL — ABNORMAL HIGH (ref 0.7–4.0)
MCH: 30.8 pg (ref 26.0–34.0)
MCHC: 32.8 g/dL (ref 30.0–36.0)
MCV: 94.2 fL (ref 80.0–100.0)
MONO ABS: 0.5 10*3/uL (ref 0.1–1.0)
Monocytes Relative: 4 %
NEUTROS ABS: 6.3 10*3/uL (ref 1.7–7.7)
Neutrophils Relative %: 50 %
Platelets: 176 10*3/uL (ref 150–400)
RBC: 4.28 MIL/uL (ref 4.22–5.81)
RDW: 13.4 % (ref 11.5–15.5)
WBC: 12.5 10*3/uL — AB (ref 4.0–10.5)
nRBC: 0.2 % (ref 0.0–0.2)

## 2018-05-27 LAB — URINALYSIS, ROUTINE W REFLEX MICROSCOPIC
Bilirubin Urine: NEGATIVE
Glucose, UA: 50 mg/dL — AB
Hgb urine dipstick: NEGATIVE
Ketones, ur: NEGATIVE mg/dL
Leukocytes, UA: NEGATIVE
Nitrite: NEGATIVE
Protein, ur: NEGATIVE mg/dL
Specific Gravity, Urine: 1.015 (ref 1.005–1.030)
pH: 6 (ref 5.0–8.0)

## 2018-05-27 LAB — CULTURE, BLOOD (ROUTINE X 2)
CULTURE: NO GROWTH
Culture: NO GROWTH
Special Requests: ADEQUATE
Special Requests: ADEQUATE

## 2018-05-27 LAB — COMPREHENSIVE METABOLIC PANEL WITH GFR
ALT: 40 U/L (ref 0–44)
AST: 34 U/L (ref 15–41)
Albumin: 4 g/dL (ref 3.5–5.0)
Alkaline Phosphatase: 56 U/L (ref 38–126)
Anion gap: 7 (ref 5–15)
BUN: 27 mg/dL — ABNORMAL HIGH (ref 8–23)
CO2: 24 mmol/L (ref 22–32)
Calcium: 9 mg/dL (ref 8.9–10.3)
Chloride: 105 mmol/L (ref 98–111)
Creatinine, Ser: 0.78 mg/dL (ref 0.61–1.24)
GFR calc Af Amer: >60 mL/min
GFR calc non Af Amer: >60 mL/min
Glucose, Bld: 186 mg/dL — ABNORMAL HIGH (ref 70–99)
Potassium: 4.1 mmol/L (ref 3.5–5.1)
Sodium: 136 mmol/L (ref 135–145)
Total Bilirubin: 0.8 mg/dL (ref 0.3–1.2)
Total Protein: 6.3 g/dL — ABNORMAL LOW (ref 6.5–8.1)

## 2018-05-27 LAB — I-STAT CG4 LACTIC ACID, ED
Lactic Acid, Venous: 1.14 mmol/L (ref 0.5–1.9)
Lactic Acid, Venous: 0.81 mmol/L (ref 0.5–1.9)

## 2018-05-27 MED ORDER — TOLNAFTATE 1 % EX CREA: 1.0000 "application " | TOPICAL_CREAM | Freq: Two times a day (BID) | CUTANEOUS | 0 refills | Status: DC

## 2018-05-27 MED ORDER — VANCOMYCIN HCL IN DEXTROSE 1-5 GM/200ML-% IV SOLN
1000.0000 mg | Freq: Once | INTRAVENOUS | Status: AC
  Administered 2018-05-27: 1000 mg via INTRAVENOUS
  Filled 2018-05-27: qty 200

## 2018-05-27 MED ORDER — ONDANSETRON HCL 4 MG/2ML IJ SOLN
4.0000 mg | Freq: Once | INTRAMUSCULAR | Status: AC
  Administered 2018-05-27: 4 mg via INTRAVENOUS
  Filled 2018-05-27: qty 2

## 2018-05-27 MED ORDER — SODIUM CHLORIDE 0.9 % IV BOLUS (SEPSIS)
1000.0000 mL | Freq: Once | INTRAVENOUS | Status: AC
  Administered 2018-05-27: 1000 mL via INTRAVENOUS

## 2018-05-27 MED ORDER — SODIUM CHLORIDE 0.9 % IV SOLN
2.0000 g | INTRAVENOUS | Status: DC
  Administered 2018-05-27: 2 g via INTRAVENOUS
  Filled 2018-05-27: qty 20

## 2018-05-27 MED ORDER — LORAZEPAM 2 MG/ML IJ SOLN: 1.0000 mg | Freq: Once | INTRAMUSCULAR | Status: DC

## 2018-05-27 MED ORDER — LORAZEPAM 2 MG/ML IJ SOLN
1.0000 mg | Freq: Once | INTRAMUSCULAR | Status: AC
  Administered 2018-05-27: 1 mg via INTRAVENOUS
  Filled 2018-05-27: qty 1

## 2018-05-27 MED ORDER — GADOBUTROL 1 MMOL/ML IV SOLN
6.0000 mL | Freq: Once | INTRAVENOUS | Status: AC | PRN
  Administered 2018-05-27: 6 mL via INTRAVENOUS

## 2018-05-27 MED ORDER — FENTANYL CITRATE (PF) 100 MCG/2ML IJ SOLN
50.0000 ug | Freq: Once | INTRAMUSCULAR | Status: AC
  Administered 2018-05-27: 50 ug via INTRAVENOUS
  Filled 2018-05-27: qty 2

## 2018-05-27 MED ORDER — HALOPERIDOL LACTATE 5 MG/ML IJ SOLN
2.0000 mg | Freq: Once | INTRAMUSCULAR | Status: AC
  Administered 2018-05-27: 2 mg via INTRAVENOUS
  Filled 2018-05-27: qty 1

## 2018-05-27 NOTE — ED Notes (Signed)
Attempted to call [facility] to give report to RN. No answer

## 2018-05-27 NOTE — ED Notes (Signed)
Pt verbalized discharge instructions. PTAR at bedside

## 2018-05-27 NOTE — Progress Notes (Signed)
A consult was received from an ED physician for vancomycin per pharmacy dosing.  The patient's profile has been reviewed for ht/wt/allergies/indication/available labs.   A one time order has been placed for 1g IV.  Further antibiotics/pharmacy consults should be ordered by admitting physician if indicated.                       Thank you, Lolita Patella 05/27/2018  11:52 AM

## 2018-05-27 NOTE — ED Provider Notes (Signed)
Pt signed out by Dr. Eulis Foster awaiting results of MRI.  IMPRESSION:  Nonspecific superficial edema throughout the mid to distal penile  shaft and anterior scrotum. No abscess.   No evidence of deep space infection.  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  Pt will be d/c with Tinactin.  Return if worse.   Isla Pence, MD 05/27/18 1719

## 2018-05-27 NOTE — ED Notes (Signed)
Pt transported to MRI 

## 2018-05-27 NOTE — ED Triage Notes (Addendum)
EMS reports Nursing Home  Called due to groin pain, pt has been having an issue in the area for a while and been treated several times. Upon assessment it is not groin pain it is excoriation of penis and front side scrotum.

## 2018-05-27 NOTE — ED Notes (Signed)
Dr Eulis Foster in assessing pt at present time.

## 2018-05-27 NOTE — ED Notes (Addendum)
PTAR contacted to transport pt back to [facility]. Paperwork printed

## 2018-05-27 NOTE — ED Notes (Signed)
Bed: PB22 Expected date:  Expected time:  Means of arrival:  Comments: EMS/groin pain
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
## 2018-05-28 DIAGNOSIS — F0390 Unspecified dementia without behavioral disturbance: Secondary | ICD-10-CM | POA: Diagnosis not present

## 2018-05-28 DIAGNOSIS — B961 Klebsiella pneumoniae [K. pneumoniae] as the cause of diseases classified elsewhere: Secondary | ICD-10-CM | POA: Diagnosis not present

## 2018-05-28 DIAGNOSIS — F418 Other specified anxiety disorders: Secondary | ICD-10-CM | POA: Diagnosis not present

## 2018-05-28 DIAGNOSIS — E1142 Type 2 diabetes mellitus with diabetic polyneuropathy: Secondary | ICD-10-CM | POA: Diagnosis not present

## 2018-05-28 DIAGNOSIS — N390 Urinary tract infection, site not specified: Secondary | ICD-10-CM | POA: Diagnosis not present

## 2018-05-28 DIAGNOSIS — C9110 Chronic lymphocytic leukemia of B-cell type not having achieved remission: Secondary | ICD-10-CM | POA: Diagnosis not present

## 2018-05-28 LAB — URINE CULTURE: CULTURE: NO GROWTH

## 2018-05-29 ENCOUNTER — Emergency Department (HOSPITAL_COMMUNITY)
Admission: EM | Admit: 2018-05-29 | Discharge: 2018-05-29 | Disposition: A | Discharge status: Skilled Nursing Facility | Payer: Medicare Other | Attending: Emergency Medicine | Admitting: Emergency Medicine

## 2018-05-29 ENCOUNTER — Other Ambulatory Visit: Payer: Self-pay

## 2018-05-29 ENCOUNTER — Encounter (HOSPITAL_COMMUNITY): Payer: Self-pay | Admitting: *Deleted

## 2018-05-29 DIAGNOSIS — F0390 Unspecified dementia without behavioral disturbance: Secondary | ICD-10-CM | POA: Diagnosis not present

## 2018-05-29 DIAGNOSIS — E119 Type 2 diabetes mellitus without complications: Secondary | ICD-10-CM | POA: Diagnosis not present

## 2018-05-29 DIAGNOSIS — R21 Rash and other nonspecific skin eruption: Secondary | ICD-10-CM | POA: Diagnosis not present

## 2018-05-29 DIAGNOSIS — N4889 Other specified disorders of penis: Secondary | ICD-10-CM | POA: Insufficient documentation

## 2018-05-29 DIAGNOSIS — Z7401 Bed confinement status: Secondary | ICD-10-CM | POA: Diagnosis not present

## 2018-05-29 DIAGNOSIS — M2550 Pain in unspecified joint: Secondary | ICD-10-CM | POA: Diagnosis not present

## 2018-05-29 HISTORY — DX: Candidiasis, unspecified: B37.9

## 2018-05-29 NOTE — ED Notes (Signed)
[facility] has been called and will send Unit for transfer ASAP.
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
## 2018-05-29 NOTE — Discharge Instructions (Signed)
You were seen in the ED for pain. Continue your anti-fungal medication. Take Tylenol and/or Motrin for pain. Call the Urologist listed to schedule a follow up appointment.

## 2018-05-29 NOTE — ED Notes (Signed)
Bed: WA17 Expected date:  Expected time:  Means of arrival:  Comments: Chronic Penile Pain (here multiple times)

## 2018-05-29 NOTE — ED Notes (Signed)
ED Provider at bedside. 

## 2018-05-29 NOTE — ED Provider Notes (Signed)
Emergency Department Provider Note   I have reviewed the triage vital signs and the nursing notes.   HISTORY  Chief Complaint No chief complaint on file.   HPI Hayden [name] is a 77 y.o. male with PMH of candidiasis, GERD, Fibromyalgia, and HLD presents to the emergency department from [facility] with concern for intermittent but worsening penile pain.  Patient is incontinent and and diapers.  He was recently diagnosed with Candida infection of the penis and started on antifungal medications.  He states that the staff at [facility] have been providing this for him and his swelling is gone down significantly.  He tells me that he continues to have some pain.  He is unsure if he is followed up with a urologist.  Denies any dysuria, hesitancy, or urgency.  Denies any lower abdominal pain.  No fevers or chills. No pain at the time of my assessment.   Past Medical History:  Diagnosis Date  . Candidiasis   . Depression   . Diabetes mellitus   . Enlarged prostate   . Fibromyalgia   . GERD (gastroesophageal reflux disease)    reflux intermittent  . Hyperlipidemia   . 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 Dementia with behavioral disturbance ([place]) 05/24/2018  . Pressure injury of skin 05/23/2018  . Cellulitis 04/17/2018  . Cellulitis, scrotum 03/09/2018  . Cellulitis of male genitalia 03/08/2018  . Depression 03/08/2018  . BPH (benign prostatic hyperplasia) 03/08/2018  . Leukocytosis 03/08/2018  . Mild dementia () 10/23/2017  . UTI (urinary tract infection) 09/06/2017  . Hyperglycemia 09/06/2017  . ARF (acute renal failure) ([place]) 09/06/2017  . Altered mental status 09/06/2017  . Type 2 diabetes mellitus with complication ([place]) 16/03/9603  . 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 ADENOIDECTOMY    . CARPAL TUNNEL RELEASE     rt hand  . CATARACT EXTRACTION, BILATERAL    . CERVICAL FUSION  1998  . CYSTOSCOPY  07/26/2012   Procedure: CYSTOSCOPY;  Surgeon: Ailene Rud, MD;  Location[facility];  Service: Urology;  Laterality: N/A;  DIAGNOSTIC CYSTO PROSTATE ULTRASOUND      . PROSTATE BIOPSY  07/26/2012   Procedure: PROSTATE BIOPSY;  Surgeon: Ailene Rud, MD;  Location: [facility];  Service: Urology;  Laterality: N/A;  . retinal micro aneurysms    . tonsil    . VASECTOMY    . VITRECTOMY      Allergies Bee venom; Glucophage [metformin]; Shellfish allergy; Grass extracts [gramineae pollens]; and Tobacco [nicotiana tabacum]  Family History  Problem Relation Age of Onset  . Diabetes Father   . Diabetes Sister   . Diabetes Brother     Social History Social History   Tobacco Use  . Smoking status: Never Smoker  . Smokeless tobacco: Never Used  Substance Use Topics  . Alcohol use: No    Frequency: Never  . Drug use: No    Review of Systems  Constitutional: No fever/chills Eyes: No visual changes. ENT: No sore throat. Cardiovascular: Denies chest pain. Respiratory: Denies shortness of breath. Gastrointestinal: No abdominal pain.  No nausea, no vomiting.  No diarrhea.  No constipation. Genitourinary: Negative for dysuria. Positive penis pain and redness.  Musculoskeletal: Negative for back pain. Skin: Negative for rash. Neurological: Negative for headaches, focal weakness or numbness.  10-point ROS otherwise negative.  ____________________________________________   PHYSICAL EXAM:  VITAL SIGNS: ED Triage Vitals  Enc Vitals Group     BP 05/29/18 0749 119/62     Pulse Rate 05/29/18 0749 72     Resp 05/29/18 0749 18     Temp 05/29/18 0749 98.2 F (36.8 C)     Temp Source 05/29/18 0749 Oral     SpO2 05/29/18 0741 100 %     Weight  05/29/18 0747 143 lb 1.3 oz (64.9 kg)     Height 05/29/18 0747 5\' 8"  (1.727 m)     Pain Score 05/29/18 0745 2    Constitutional: Alert. Well appearing and in no acute distress. Eyes: Conjunctivae are normal.  Head: Atraumatic. Nose: No congestion/rhinnorhea. Mouth/Throat: Mucous membranes are moist.  Neck: No stridor.  Cardiovascular: Good peripheral circulation.  Respiratory: Normal respiratory effort.  Gastrointestinal: No distention.  Genitourinary: Generalized erythema with no significant swelling. No testicular tenderness.  Musculoskeletal: No lower extremity tenderness nor edema. No gross deformities of extremities. Neurologic:  Normal speech and language. No gross focal neurologic deficits are appreciated.  Skin:  Skin is warm, dry and intact. Erythema over genitalia.   ____________________________________________   PROCEDURES  Procedure(s) performed:   Procedures  None ____________________________________________   INITIAL IMPRESSION / ASSESSMENT AND PLAN / ED COURSE  Pertinent labs & imaging results that were available during my care of the patient were reviewed by me and considered in my medical decision making (see chart for details).  Patient returns to the emergency department with penile pain.  He has improving rash in the genitalia and is currently on antifungal medications.  No indication for additional ultrasound.  No UTI symptoms.  Advised Tylenol and/or Motrin for pain and Urology follow up.   At this time, I do not feel there is any life-threatening condition present. I have reviewed and discussed all results (EKG, imaging, lab, urine as appropriate), exam findings with patient. I have reviewed nursing notes and appropriate previous records.  I feel the patient is safe to be discharged home without further emergent workup. Discussed usual and customary return precautions. Patient and family (if present) verbalize understanding and are comfortable with this  plan.  Patient will follow-up with their primary care provider. If they do not have a primary care provider, information for follow-up has been provided to them. All questions have been answered.  ____________________________________________  FINAL CLINICAL IMPRESSION(S) / ED DIAGNOSES  Final diagnoses:  Penile pain    Note:  This document was prepared using Dragon voice recognition software and may include unintentional dictation errors.  Nanda Quinton, MD Emergency Medicine    Long, [facility], MD 05/29/18 [phone]

## 2018-05-31 DIAGNOSIS — B961 Klebsiella pneumoniae [K. pneumoniae] as the cause of diseases classified elsewhere: Secondary | ICD-10-CM | POA: Diagnosis not present

## 2018-05-31 DIAGNOSIS — Z760 Encounter for issue of repeat prescription: Secondary | ICD-10-CM | POA: Diagnosis not present

## 2018-05-31 DIAGNOSIS — C9110 Chronic lymphocytic leukemia of B-cell type not having achieved remission: Secondary | ICD-10-CM | POA: Diagnosis not present

## 2018-05-31 DIAGNOSIS — L22 Diaper dermatitis: Secondary | ICD-10-CM | POA: Diagnosis not present

## 2018-05-31 DIAGNOSIS — N390 Urinary tract infection, site not specified: Secondary | ICD-10-CM | POA: Diagnosis not present

## 2018-05-31 DIAGNOSIS — N492 Inflammatory disorders of scrotum: Secondary | ICD-10-CM | POA: Diagnosis not present

## 2018-05-31 DIAGNOSIS — E11319 Type 2 diabetes mellitus with unspecified diabetic retinopathy without macular edema: Secondary | ICD-10-CM | POA: Diagnosis not present

## 2018-05-31 DIAGNOSIS — E1142 Type 2 diabetes mellitus with diabetic polyneuropathy: Secondary | ICD-10-CM | POA: Diagnosis not present

## 2018-05-31 DIAGNOSIS — F0390 Unspecified dementia without behavioral disturbance: Secondary | ICD-10-CM | POA: Diagnosis not present

## 2018-05-31 DIAGNOSIS — F418 Other specified anxiety disorders: Secondary | ICD-10-CM | POA: Diagnosis not present

## 2018-06-01 ENCOUNTER — Other Ambulatory Visit: Payer: Self-pay

## 2018-06-01 LAB — CULTURE, BLOOD (ROUTINE X 2)
Culture: NO GROWTH
Culture: NO GROWTH
SPECIAL REQUESTS: ADEQUATE
Special Requests: ADEQUATE

## 2018-06-01 NOTE — Patient Outreach (Signed)
[facility]) Care Management  06/01/2018  Hayden [name] [birth year] [id number]    Telephone Screen Referral Date :05/31/2018 Referral Source:[facility] ED Census Referral Reason:ED Utilization Insurance:Medicare   Outreach attempt # 1  To patient for screening. No answer. Unable to leave a voicemail.   Plan: RN Health Coach will outreach the patient within four business days.   Lazaro Arms RN, BSN, [place] Direct Dial:  [phone] Fax: [phone]

## 2018-06-02 ENCOUNTER — Ambulatory Visit: Payer: Self-pay

## 2018-06-03 ENCOUNTER — Other Ambulatory Visit: Payer: Self-pay

## 2018-06-03 DIAGNOSIS — N390 Urinary tract infection, site not specified: Secondary | ICD-10-CM | POA: Diagnosis not present

## 2018-06-03 DIAGNOSIS — F0390 Unspecified dementia without behavioral disturbance: Secondary | ICD-10-CM | POA: Diagnosis not present

## 2018-06-03 DIAGNOSIS — B961 Klebsiella pneumoniae [K. pneumoniae] as the cause of diseases classified elsewhere: Secondary | ICD-10-CM | POA: Diagnosis not present

## 2018-06-03 DIAGNOSIS — C9110 Chronic lymphocytic leukemia of B-cell type not having achieved remission: Secondary | ICD-10-CM | POA: Diagnosis not present

## 2018-06-03 DIAGNOSIS — F418 Other specified anxiety disorders: Secondary | ICD-10-CM | POA: Diagnosis not present

## 2018-06-03 DIAGNOSIS — E1142 Type 2 diabetes mellitus with diabetic polyneuropathy: Secondary | ICD-10-CM | POA: Diagnosis not present

## 2018-06-03 NOTE — Patient Outreach (Signed)
[facility]) Care Management  06/03/2018  Hayden [name] May 08, 1941 [id number]    Telephone Screen Referral Date :05/31/2018 Referral Source:[facility] ED Census Referral Reason:ED Utilization Insurance:Medicare   Outreach attempt # 2 To patient for screening.  Spoke with Amy at [facility] who verified HIPAA.  Amy states that the patient resides at [facility] and needs are being attended to.   Plan: RN Health Coach will close the case at this time due to no further interventions are needed.  Lazaro Arms RN, BSN, [place] Direct Dial: [phone]

## 2018-06-05 ENCOUNTER — Emergency Department (HOSPITAL_COMMUNITY)
Admission: EM | Admit: 2018-06-05 | Discharge: 2018-06-06 | Disposition: A | Discharge status: Home / Self Care | Payer: Medicare Other | Attending: Emergency Medicine | Admitting: Emergency Medicine

## 2018-06-05 ENCOUNTER — Other Ambulatory Visit: Payer: Self-pay

## 2018-06-05 ENCOUNTER — Encounter (HOSPITAL_COMMUNITY): Payer: Self-pay

## 2018-06-05 DIAGNOSIS — Z794 Long term (current) use of insulin: Secondary | ICD-10-CM | POA: Diagnosis not present

## 2018-06-05 DIAGNOSIS — Z856 Personal history of leukemia: Secondary | ICD-10-CM | POA: Diagnosis not present

## 2018-06-05 DIAGNOSIS — G3184 Mild cognitive impairment, so stated: Secondary | ICD-10-CM | POA: Insufficient documentation

## 2018-06-05 DIAGNOSIS — F0281 Dementia in other diseases classified elsewhere with behavioral disturbance: Secondary | ICD-10-CM | POA: Diagnosis not present

## 2018-06-05 DIAGNOSIS — Z79899 Other long term (current) drug therapy: Secondary | ICD-10-CM | POA: Insufficient documentation

## 2018-06-05 DIAGNOSIS — Z7982 Long term (current) use of aspirin: Secondary | ICD-10-CM | POA: Insufficient documentation

## 2018-06-05 DIAGNOSIS — R4182 Altered mental status, unspecified: Secondary | ICD-10-CM | POA: Diagnosis not present

## 2018-06-05 DIAGNOSIS — E119 Type 2 diabetes mellitus without complications: Secondary | ICD-10-CM | POA: Diagnosis not present

## 2018-06-05 DIAGNOSIS — F918 Other conduct disorders: Secondary | ICD-10-CM | POA: Diagnosis not present

## 2018-06-05 DIAGNOSIS — R4689 Other symptoms and signs involving appearance and behavior: Secondary | ICD-10-CM

## 2018-06-05 DIAGNOSIS — G309 Alzheimer's disease, unspecified: Secondary | ICD-10-CM | POA: Insufficient documentation

## 2018-06-05 DIAGNOSIS — F29 Unspecified psychosis not due to a substance or known physiological condition: Secondary | ICD-10-CM | POA: Diagnosis not present

## 2018-06-05 DIAGNOSIS — I10 Essential (primary) hypertension: Secondary | ICD-10-CM | POA: Diagnosis not present

## 2018-06-05 LAB — CBC WITH DIFFERENTIAL/PLATELET
Abs Immature Granulocytes: 0.05 10*3/uL (ref 0.00–0.07)
BASOS PCT: 0 %
Basophils Absolute: 0 10*3/uL (ref 0.0–0.1)
Eosinophils Absolute: 0.2 10*3/uL (ref 0.0–0.5)
Eosinophils Relative: 1 %
HCT: 41.5 % (ref 39.0–52.0)
Hemoglobin: 13.5 g/dL (ref 13.0–17.0)
Immature Granulocytes: 0 %
Lymphocytes Relative: 48 %
Lymphs Abs: 6.1 10*3/uL — ABNORMAL HIGH (ref 0.7–4.0)
MCH: 31.3 pg (ref 26.0–34.0)
MCHC: 32.5 g/dL (ref 30.0–36.0)
MCV: 96.3 fL (ref 80.0–100.0)
Monocytes Absolute: 0.5 10*3/uL (ref 0.1–1.0)
Monocytes Relative: 4 %
NEUTROS PCT: 47 %
Neutro Abs: 6.1 10*3/uL (ref 1.7–7.7)
Platelets: 205 10*3/uL (ref 150–400)
RBC: 4.31 MIL/uL (ref 4.22–5.81)
RDW: 13.2 % (ref 11.5–15.5)
WBC: 13 10*3/uL — ABNORMAL HIGH (ref 4.0–10.5)
nRBC: 0.3 % — ABNORMAL HIGH (ref 0.0–0.2)

## 2018-06-05 LAB — COMPREHENSIVE METABOLIC PANEL
ALT: 37 U/L (ref 0–44)
AST: 41 U/L (ref 15–41)
Albumin: 3.6 g/dL (ref 3.5–5.0)
Alkaline Phosphatase: 49 U/L (ref 38–126)
Anion gap: 8 (ref 5–15)
BILIRUBIN TOTAL: 1.4 mg/dL — AB (ref 0.3–1.2)
BUN: 20 mg/dL (ref 8–23)
CO2: 24 mmol/L (ref 22–32)
Calcium: 8.6 mg/dL — ABNORMAL LOW (ref 8.9–10.3)
Chloride: 104 mmol/L (ref 98–111)
Creatinine, Ser: 0.81 mg/dL (ref 0.61–1.24)
GFR calc Af Amer: >60 mL/min (ref >60)
GFR calc non Af Amer: >60 mL/min (ref >60)
Glucose, Bld: 289 mg/dL — ABNORMAL HIGH (ref 70–99)
POTASSIUM: 4.4 mmol/L (ref 3.5–5.1)
Sodium: 136 mmol/L (ref 135–145)
TOTAL PROTEIN: 5.6 g/dL — AB (ref 6.5–8.1)

## 2018-06-05 LAB — URINALYSIS, ROUTINE W REFLEX MICROSCOPIC
Bacteria, UA: NONE SEEN
Bilirubin Urine: NEGATIVE
Glucose, UA: >=500 mg/dL — AB
Hgb urine dipstick: NEGATIVE
Ketones, ur: 5 mg/dL — AB
Leukocytes, UA: NEGATIVE
Nitrite: NEGATIVE
Protein, ur: NEGATIVE mg/dL
SPECIFIC GRAVITY, URINE: 1.016 (ref 1.005–1.030)
pH: 7 (ref 5.0–8.0)

## 2018-06-05 LAB — RAPID URINE DRUG SCREEN, HOSP PERFORMED
Amphetamines: NOT DETECTED
Barbiturates: NOT DETECTED
Benzodiazepines: NOT DETECTED
Cocaine: NOT DETECTED
Opiates: NOT DETECTED
Tetrahydrocannabinol: NOT DETECTED

## 2018-06-05 LAB — VALPROIC ACID LEVEL: Valproic Acid Lvl: 21 ug/mL — ABNORMAL LOW (ref 50.0–100.0)

## 2018-06-05 LAB — CBG MONITORING, ED
Glucose-Capillary: 191 mg/dL — ABNORMAL HIGH (ref 70–99)
Glucose-Capillary: 156 mg/dL — ABNORMAL HIGH (ref 70–99)

## 2018-06-05 LAB — ETHANOL: Alcohol, Ethyl (B): <10 mg/dL (ref <10)

## 2018-06-05 MED ORDER — INSULIN DETEMIR 100 UNIT/ML ~~LOC~~ SOLN
36.0000 [IU] | Freq: Every day | SUBCUTANEOUS | Status: DC
  Administered 2018-06-05: 36 [IU] via SUBCUTANEOUS
  Filled 2018-06-05: qty 0.36

## 2018-06-05 MED ORDER — TRAZODONE HCL 50 MG PO TABS
150.0000 mg | ORAL_TABLET | Freq: Every day | ORAL | Status: DC
  Administered 2018-06-05: 150 mg via ORAL
  Filled 2018-06-05: qty 1

## 2018-06-05 MED ORDER — DONEPEZIL HCL 5 MG PO TABS
10.0000 mg | ORAL_TABLET | Freq: Every day | ORAL | Status: DC
  Administered 2018-06-05: 10 mg via ORAL
  Filled 2018-06-05: qty 2

## 2018-06-05 MED ORDER — ASPIRIN EC 81 MG PO TBEC: 81.0000 mg | DELAYED_RELEASE_TABLET | Freq: Every day | ORAL | Status: DC

## 2018-06-05 MED ORDER — CEPHALEXIN 500 MG PO CAPS
500.0000 mg | ORAL_CAPSULE | Freq: Three times a day (TID) | ORAL | Status: DC
  Administered 2018-06-05 (×2): 500 mg via ORAL
  Filled 2018-06-05 (×2): qty 1

## 2018-06-05 MED ORDER — LORATADINE 10 MG PO TABS
10.0000 mg | ORAL_TABLET | Freq: Every day | ORAL | Status: DC
  Administered 2018-06-05: 10 mg via ORAL
  Filled 2018-06-05: qty 1

## 2018-06-05 MED ORDER — VITAMIN D 25 MCG (1000 UNIT) PO TABS
1000.0000 [IU] | ORAL_TABLET | Freq: Every day | ORAL | Status: DC
  Filled 2018-06-05: qty 1

## 2018-06-05 MED ORDER — OMEGA-3-ACID ETHYL ESTERS 1 G PO CAPS
1.0000 g | ORAL_CAPSULE | Freq: Two times a day (BID) | ORAL | Status: DC
  Administered 2018-06-05: 1 g via ORAL
  Filled 2018-06-05: qty 1

## 2018-06-05 MED ORDER — QUETIAPINE FUMARATE 25 MG PO TABS: 12.5000 mg | ORAL_TABLET | ORAL | Status: DC

## 2018-06-05 MED ORDER — INSULIN ASPART 100 UNIT/ML ~~LOC~~ SOLN
4.0000 [IU] | Freq: Three times a day (TID) | SUBCUTANEOUS | Status: DC
  Administered 2018-06-05: 4 [IU] via SUBCUTANEOUS
  Filled 2018-06-05: qty 1

## 2018-06-05 MED ORDER — NYSTATIN 100000 UNIT/GM EX CREA
1.0000 "application " | TOPICAL_CREAM | Freq: Two times a day (BID) | CUTANEOUS | Status: DC
  Filled 2018-06-05: qty 15

## 2018-06-05 MED ORDER — PRAVASTATIN SODIUM 20 MG PO TABS
20.0000 mg | ORAL_TABLET | Freq: Every day | ORAL | Status: DC
  Administered 2018-06-05: 20 mg via ORAL
  Filled 2018-06-05: qty 1

## 2018-06-05 MED ORDER — TOLNAFTATE 1 % EX CREA
1.0000 "application " | TOPICAL_CREAM | Freq: Two times a day (BID) | CUTANEOUS | Status: DC
  Filled 2018-06-05: qty 14.18

## 2018-06-05 MED ORDER — VITAMIN B-12 1000 MCG PO TABS: 1000.0000 ug | ORAL_TABLET | Freq: Every day | ORAL | Status: DC

## 2018-06-05 MED ORDER — TAMSULOSIN HCL 0.4 MG PO CAPS
0.4000 mg | ORAL_CAPSULE | Freq: Every evening | ORAL | Status: DC
  Administered 2018-06-05: 0.4 mg via ORAL
  Filled 2018-06-05: qty 1

## 2018-06-05 MED ORDER — BUPROPION HCL ER (XL) 150 MG PO TB24: 300.0000 mg | ORAL_TABLET | ORAL | Status: DC

## 2018-06-05 MED ORDER — DOCUSATE SODIUM 100 MG PO CAPS: 100.0000 mg | ORAL_CAPSULE | Freq: Two times a day (BID) | ORAL | Status: DC
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
## 2018-06-05 NOTE — Progress Notes (Signed)
CSW aware patient has been assessed by TTS and is psychiatrically cleared. CSW aware patient is from [facility], attempted to reach facility staff at [phone]. The phone was not answered, CSW left voicemail.   CSW attempted a second call, was transferred to [phone], identified as a phone number for leadership. The call was not answered and CSW left another voicemail.  CSW following for discharge needs.  Stephanie Acre, [place] Social Worker [phone]

## 2018-06-06 LAB — URINE CULTURE: Culture: <10000 — AB

## 2018-06-06 NOTE — ED Provider Notes (Signed)
Medically and psych cleared. The patient is safe for discharge with strict return precautions.    Fatima Blank, MD 06/06/18 Laureen Abrahams

## 2018-06-07 DIAGNOSIS — F418 Other specified anxiety disorders: Secondary | ICD-10-CM | POA: Diagnosis not present

## 2018-06-07 DIAGNOSIS — Z760 Encounter for issue of repeat prescription: Secondary | ICD-10-CM | POA: Diagnosis not present

## 2018-06-07 DIAGNOSIS — N492 Inflammatory disorders of scrotum: Secondary | ICD-10-CM | POA: Diagnosis not present

## 2018-06-07 DIAGNOSIS — F0391 Unspecified dementia with behavioral disturbance: Secondary | ICD-10-CM | POA: Diagnosis not present

## 2018-06-07 DIAGNOSIS — E11319 Type 2 diabetes mellitus with unspecified diabetic retinopathy without macular edema: Secondary | ICD-10-CM | POA: Diagnosis not present

## 2018-06-07 DIAGNOSIS — L22 Diaper dermatitis: Secondary | ICD-10-CM | POA: Diagnosis not present

## 2018-06-08 DIAGNOSIS — E1142 Type 2 diabetes mellitus with diabetic polyneuropathy: Secondary | ICD-10-CM | POA: Diagnosis not present

## 2018-06-08 DIAGNOSIS — N390 Urinary tract infection, site not specified: Secondary | ICD-10-CM | POA: Diagnosis not present

## 2018-06-08 DIAGNOSIS — C9110 Chronic lymphocytic leukemia of B-cell type not having achieved remission: Secondary | ICD-10-CM | POA: Diagnosis not present

## 2018-06-08 DIAGNOSIS — F418 Other specified anxiety disorders: Secondary | ICD-10-CM | POA: Diagnosis not present

## 2018-06-08 DIAGNOSIS — B961 Klebsiella pneumoniae [K. pneumoniae] as the cause of diseases classified elsewhere: Secondary | ICD-10-CM | POA: Diagnosis not present

## 2018-06-08 DIAGNOSIS — F0390 Unspecified dementia without behavioral disturbance: Secondary | ICD-10-CM | POA: Diagnosis not present

## 2018-06-09 DIAGNOSIS — E1142 Type 2 diabetes mellitus with diabetic polyneuropathy: Secondary | ICD-10-CM | POA: Diagnosis not present

## 2018-06-09 DIAGNOSIS — F0390 Unspecified dementia without behavioral disturbance: Secondary | ICD-10-CM | POA: Diagnosis not present

## 2018-06-09 DIAGNOSIS — B961 Klebsiella pneumoniae [K. pneumoniae] as the cause of diseases classified elsewhere: Secondary | ICD-10-CM | POA: Diagnosis not present

## 2018-06-09 DIAGNOSIS — N390 Urinary tract infection, site not specified: Secondary | ICD-10-CM | POA: Diagnosis not present

## 2018-06-09 DIAGNOSIS — C9110 Chronic lymphocytic leukemia of B-cell type not having achieved remission: Secondary | ICD-10-CM | POA: Diagnosis not present

## 2018-06-09 DIAGNOSIS — F418 Other specified anxiety disorders: Secondary | ICD-10-CM | POA: Diagnosis not present

## 2018-06-10 DIAGNOSIS — E1142 Type 2 diabetes mellitus with diabetic polyneuropathy: Secondary | ICD-10-CM | POA: Diagnosis not present

## 2018-06-10 DIAGNOSIS — F418 Other specified anxiety disorders: Secondary | ICD-10-CM | POA: Diagnosis not present

## 2018-06-10 DIAGNOSIS — F0390 Unspecified dementia without behavioral disturbance: Secondary | ICD-10-CM | POA: Diagnosis not present

## 2018-06-10 DIAGNOSIS — C9110 Chronic lymphocytic leukemia of B-cell type not having achieved remission: Secondary | ICD-10-CM | POA: Diagnosis not present

## 2018-06-10 DIAGNOSIS — N390 Urinary tract infection, site not specified: Secondary | ICD-10-CM | POA: Diagnosis not present

## 2018-06-10 DIAGNOSIS — B961 Klebsiella pneumoniae [K. pneumoniae] as the cause of diseases classified elsewhere: Secondary | ICD-10-CM | POA: Diagnosis not present

## 2018-06-11 ENCOUNTER — Other Ambulatory Visit: Payer: Self-pay

## 2018-06-11 ENCOUNTER — Emergency Department (HOSPITAL_COMMUNITY)
Admission: EM | Admit: 2018-06-11 | Discharge: 2018-06-12 | Disposition: A | Discharge status: Skilled Nursing Facility | Payer: Medicare Other | Attending: Emergency Medicine | Admitting: Emergency Medicine

## 2018-06-11 DIAGNOSIS — F418 Other specified anxiety disorders: Secondary | ICD-10-CM | POA: Diagnosis not present

## 2018-06-11 DIAGNOSIS — E1142 Type 2 diabetes mellitus with diabetic polyneuropathy: Secondary | ICD-10-CM | POA: Diagnosis not present

## 2018-06-11 DIAGNOSIS — E119 Type 2 diabetes mellitus without complications: Secondary | ICD-10-CM | POA: Insufficient documentation

## 2018-06-11 DIAGNOSIS — E1165 Type 2 diabetes mellitus with hyperglycemia: Secondary | ICD-10-CM | POA: Diagnosis not present

## 2018-06-11 DIAGNOSIS — Z79899 Other long term (current) drug therapy: Secondary | ICD-10-CM | POA: Insufficient documentation

## 2018-06-11 DIAGNOSIS — R310 Gross hematuria: Secondary | ICD-10-CM

## 2018-06-11 DIAGNOSIS — Z7982 Long term (current) use of aspirin: Secondary | ICD-10-CM | POA: Diagnosis not present

## 2018-06-11 DIAGNOSIS — B961 Klebsiella pneumoniae [K. pneumoniae] as the cause of diseases classified elsewhere: Secondary | ICD-10-CM | POA: Diagnosis not present

## 2018-06-11 DIAGNOSIS — F0390 Unspecified dementia without behavioral disturbance: Secondary | ICD-10-CM | POA: Diagnosis not present

## 2018-06-11 DIAGNOSIS — C9110 Chronic lymphocytic leukemia of B-cell type not having achieved remission: Secondary | ICD-10-CM | POA: Diagnosis not present

## 2018-06-11 DIAGNOSIS — R319 Hematuria, unspecified: Secondary | ICD-10-CM | POA: Diagnosis present

## 2018-06-11 DIAGNOSIS — N390 Urinary tract infection, site not specified: Secondary | ICD-10-CM | POA: Diagnosis not present

## 2018-06-11 NOTE — ED Notes (Signed)
Bed: WA09 Expected date:  Expected time:  Means of arrival:  Comments: EMS 77 yo male from [place] in catheter-placed here 2 days ago-has a UTI-facility said increased blood in urine

## 2018-06-11 NOTE — ED Triage Notes (Signed)
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  Pt is on antibiotics.  Facility reports increasing in bleeding into catheter bag following evening shift change at 1900.  Pt denies pain.  No reported fevers.  Pt hard of hearing.

## 2018-06-12 DIAGNOSIS — R279 Unspecified lack of coordination: Secondary | ICD-10-CM | POA: Diagnosis not present

## 2018-06-12 DIAGNOSIS — Z743 Need for continuous supervision: Secondary | ICD-10-CM | POA: Diagnosis not present

## 2018-06-12 DIAGNOSIS — R4182 Altered mental status, unspecified: Secondary | ICD-10-CM | POA: Diagnosis not present

## 2018-06-12 DIAGNOSIS — R310 Gross hematuria: Secondary | ICD-10-CM | POA: Diagnosis not present

## 2018-06-12 LAB — COMPREHENSIVE METABOLIC PANEL
ALT: 22 U/L (ref 0–44)
AST: 18 U/L (ref 15–41)
Albumin: 3.6 g/dL (ref 3.5–5.0)
Alkaline Phosphatase: 46 U/L (ref 38–126)
Anion gap: 6 (ref 5–15)
BUN: 21 mg/dL (ref 8–23)
CO2: 28 mmol/L (ref 22–32)
Calcium: 8.9 mg/dL (ref 8.9–10.3)
Chloride: 104 mmol/L (ref 98–111)
Creatinine, Ser: 0.67 mg/dL (ref 0.61–1.24)
GFR calc Af Amer: >60 mL/min (ref >60)
GFR calc non Af Amer: >60 mL/min (ref >60)
Glucose, Bld: 259 mg/dL — ABNORMAL HIGH (ref 70–99)
POTASSIUM: 4.1 mmol/L (ref 3.5–5.1)
Sodium: 138 mmol/L (ref 135–145)
Total Bilirubin: 0.2 mg/dL — ABNORMAL LOW (ref 0.3–1.2)
Total Protein: 5.7 g/dL — ABNORMAL LOW (ref 6.5–8.1)

## 2018-06-12 LAB — URINALYSIS, ROUTINE W REFLEX MICROSCOPIC
Bacteria, UA: NONE SEEN
Bilirubin Urine: NEGATIVE
Glucose, UA: >=500 mg/dL — AB
Ketones, ur: NEGATIVE mg/dL
Nitrite: NEGATIVE
Protein, ur: 100 mg/dL — AB
Specific Gravity, Urine: 1.014 (ref 1.005–1.030)
pH: 6 (ref 5.0–8.0)

## 2018-06-12 LAB — CBC WITH DIFFERENTIAL/PLATELET
Abs Immature Granulocytes: 0.05 10*3/uL (ref 0.00–0.07)
Basophils Absolute: 0 10*3/uL (ref 0.0–0.1)
Basophils Relative: 0 %
Eosinophils Absolute: 0.3 10*3/uL (ref 0.0–0.5)
Eosinophils Relative: 2 %
HCT: 39.5 % (ref 39.0–52.0)
Hemoglobin: 12.6 g/dL — ABNORMAL LOW (ref 13.0–17.0)
Immature Granulocytes: 0 %
Lymphocytes Relative: 59 %
Lymphs Abs: 7.2 10*3/uL — ABNORMAL HIGH (ref 0.7–4.0)
MCH: 31 pg (ref 26.0–34.0)
MCHC: 31.9 g/dL (ref 30.0–36.0)
MCV: 97.1 fL (ref 80.0–100.0)
MONO ABS: 0.6 10*3/uL (ref 0.1–1.0)
Monocytes Relative: 5 %
Neutro Abs: 4.2 10*3/uL (ref 1.7–7.7)
Neutrophils Relative %: 34 %
PLATELETS: 193 10*3/uL (ref 150–400)
RBC: 4.07 MIL/uL — AB (ref 4.22–5.81)
RDW: 13.2 % (ref 11.5–15.5)
WBC: 12.3 10*3/uL — ABNORMAL HIGH (ref 4.0–10.5)
nRBC: 0.3 % — ABNORMAL HIGH (ref 0.0–0.2)

## 2018-06-12 LAB — PROTIME-INR
INR: 1.02
Prothrombin Time: 13.3 seconds (ref 11.4–15.2)

## 2018-06-12 NOTE — ED Provider Notes (Signed)
[facility] DEPT Provider Note   CSN: [id number] Arrival date & time: 06/11/18  2228     History   Chief Complaint Chief Complaint  Patient presents with  . Hematuria    HPI Hayden [name] is a 77 y.o. male.  HPI 77 year old male with history of diabetes, enlarged prostate and penile infection with Foley catheter placement that was completed 2 or 3 days ago comes in with chief complaint of hematuria. Patient is residing at a nursing home and they started noticing that he was having gross hematuria therefore they sent him to the ER. On our assessment it was noted that patient has about 600 cc of urine in his bladder -nursing team later noted that perhaps the bulb was not in the bladder.  Patient however denies any abdominal pain, back pain, fevers, chills, nausea, vomiting.  He is not on any blood thinners.  Past Medical History:  Diagnosis Date  . Candidiasis   . Depression   . Diabetes mellitus   . Enlarged prostate   . Fibromyalgia   . GERD (gastroesophageal reflux disease)    reflux intermittent  . Hyperlipidemia   . 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 Leukemia [facility]) chronic lymphocytic   2008 diagnosed-monitored Dr     Patient Active Problem List   Diagnosis Date Noted  . Dementia with behavioral disturbance ([place]) 05/24/2018  . Pressure injury of skin 05/23/2018  . Cellulitis 04/17/2018  . Cellulitis, scrotum 03/09/2018  . Cellulitis of male genitalia 03/08/2018  . Depression 03/08/2018  . BPH (benign prostatic hyperplasia) 03/08/2018  . Leukocytosis 03/08/2018  . Mild dementia ([place]) 10/23/2017  . UTI (urinary tract infection) 09/06/2017  . Hyperglycemia 09/06/2017  . ARF (acute renal failure) ([place]) 09/06/2017  . Altered mental status 09/06/2017  . Type 2 diabetes mellitus with complication ([place]) 63/87/5643  . 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 ADENOIDECTOMY    . CARPAL TUNNEL RELEASE     rt hand  . CATARACT EXTRACTION, BILATERAL    . CERVICAL FUSION  1998  . CYSTOSCOPY  07/26/2012   Procedure: CYSTOSCOPY;  Surgeon: Ailene Rud, MD;  Location: [facility];  Service: Urology;  Laterality: N/A;  DIAGNOSTIC CYSTO PROSTATE ULTRASOUND      . PROSTATE BIOPSY  07/26/2012   Procedure: PROSTATE BIOPSY;  Surgeon: Ailene Rud, MD;  Location: [facility];  Service: Urology;  Laterality: N/A;  . retinal micro aneurysms    . tonsil    . VASECTOMY    . VITRECTOMY          Home Medications    Prior to Admission medications   Medication Sig Start Date End Date Taking? Authorizing Provider  aspirin 81 MG tablet Take 81 mg by mouth every morning.     [provider]  buPROPion (WELLBUTRIN XL) 300 MG 24 hr tablet Take 300 mg by mouth every morning.     [provider]  cephALEXin (KEFLEX) 500 MG capsule Take 500 mg by mouth 3 (three) times daily.    [provider]  cetirizine (ZYRTEC) 10 MG tablet Take 10 mg by mouth daily as needed for allergies.     [provider]  Cholecalciferol (VITAMIN D3) 1000 UNITS CAPS Take 1,000 Units by mouth daily after breakfast.     [provider]  [place] EX Apply  1 application topically See admin instructions. Apply topically to buttocks and scrotum after each incontinence episode    [provider]  divalproex (DEPAKOTE SPRINKLE) 125 MG capsule Take by mouth 2 (two) times daily.    [provider]  docusate sodium (COLACE) 100 MG capsule Take 100 mg by mouth 2 (two) times daily.    [provider]  donepezil (ARICEPT) 10 MG tablet Take 1 tablet (10 mg total) by mouth at bedtime. 05/03/18   Cameron Sprang, MD  EPINEPHrine (EPIPEN JR) 0.15 MG/0.3ML injection Inject 0.15 mg into the muscle as needed for anaphylaxis.     [provider]  fish oil-omega-3 fatty acids 1000 MG capsule Take 1 g by mouth 2 (two) times daily.     [provider]  fluconazole (DIFLUCAN) 200 MG tablet Take 200 mg by mouth daily.    [provider]  HYDROcodone-acetaminophen (NORCO/VICODIN) 5-325 MG tablet Take 1 tablet by mouth every 6 (six) hours as needed for moderate pain.    [provider]  insulin lispro (HUMALOG KWIKPEN) 100 UNIT/ML KiwkPen Inject 4-10 Units into the skin 3 (three) times daily before meals.     [provider]  LEVEMIR FLEXTOUCH 100 UNIT/ML Pen Inject 36 Units into the skin at bedtime.  07/16/15   [provider]  lovastatin (MEVACOR) 20 MG tablet Take 20 mg by mouth daily.    [provider]  naproxen sodium (ALEVE) 220 MG tablet Take 220 mg by mouth daily as needed.    [provider]  neomycin-bacitracin-polymyxin (NEOSPORIN) OINT Apply 1 application topically as needed for wound care (left shoulder).    [provider]  nystatin cream (MYCOSTATIN) Apply to affected area 2 times daily Patient taking differently: Apply 1 application topically 2 (two) times daily.  04/23/18   [name], PA-C  QUEtiapine (SEROQUEL) 25 MG tablet Take 1/2 tablet in AM, 1 tablet in PM Patient taking differently: Take 12.5-25 mg by mouth See admin instructions. Take 1/2 tablet in AM, 1 tablet in PM 05/03/18   Cameron Sprang, MD  tamsulosin (FLOMAX) 0.4 MG CAPS capsule Take 0.4 mg by mouth every evening.     [provider]  tolnaftate (TINACTIN) 1 % cream Apply 1 application topically 2 (two) times daily. 05/27/18   Isla Pence, MD  traZODone (DESYREL) 100 MG tablet Take 150 mg by mouth at bedtime.    [provider]  vitamin B-12 (CYANOCOBALAMIN) 1000 MCG tablet Take 1,000 mcg by mouth every morning.     [provider]  calcium citrate-vitamin D (CITRACAL+D) 315-200 MG-UNIT per tablet Take 1 tablet by mouth daily.  09/04/11   [provider]  terazosin (HYTRIN) 5 MG capsule Take 5 mg by mouth at bedtime.  09/04/11  [provider]    Family History Family History  Problem Relation Age of Onset  . Diabetes Father   . Diabetes Sister   . Diabetes Brother     Social History Social History   Tobacco Use  . Smoking status: Never Smoker  . Smokeless tobacco: Never Used  Substance Use Topics  . Alcohol use: No    Frequency: Never  . Drug use: No     Allergies   Bee venom; Glucophage [metformin]; Shellfish allergy; Grass extracts [gramineae pollens]; and Tobacco [nicotiana tabacum]   Review of Systems Review of Systems  Constitutional: Negative for activity change.  Genitourinary: Positive for hematuria.  Allergic/Immunologic: Negative for immunocompromised state.  Hematological: Does not  bruise/bleed easily.  All other systems reviewed and are negative.    Physical Exam Updated Vital Signs BP 132/67 (BP Location: Left Arm)   Pulse 74   Temp 97.7 F (36.5 C) (Oral)   Resp 16   Ht 5\' 8"  (1.727 m)   Wt 63.5 kg   SpO2 100%   BMI 21.29 kg/m   Physical Exam Vitals signs and nursing note reviewed.  Constitutional:      Appearance: He is well-developed.  HENT:     Head: Atraumatic.  Neck:     Musculoskeletal: Neck supple.  Cardiovascular:     Rate and Rhythm: Normal rate.  Pulmonary:     Effort: Pulmonary effort is normal.  Abdominal:     General: There is no distension.     Tenderness: There is no abdominal tenderness.  Genitourinary:    Comments: Foley catheter bag reveals gross hematuria.  Skin:    General: Skin is warm.  Neurological:     Mental Status: He is alert and oriented to person, place, and time.      ED Treatments / Results  Labs (all labs ordered are listed, but only abnormal results are displayed) Labs Reviewed  CBC WITH DIFFERENTIAL/PLATELET - Abnormal; Notable for the following components:      Result Value   WBC 12.3 (*)    RBC 4.07  (*)    Hemoglobin 12.6 (*)    nRBC 0.3 (*)    Lymphs Abs 7.2 (*)    All other components within normal limits  COMPREHENSIVE METABOLIC PANEL - Abnormal; Notable for the following components:   Glucose, Bld 259 (*)    Total Protein 5.7 (*)    Total Bilirubin 0.2 (*)    All other components within normal limits  URINALYSIS, ROUTINE W REFLEX MICROSCOPIC - Abnormal; Notable for the following components:   APPearance HAZY (*)    Glucose, UA >=500 (*)    Hgb urine dipstick LARGE (*)    Protein, ur 100 (*)    Leukocytes, UA TRACE (*)    RBC / HPF >50 (*)    All other components within normal limits  URINE CULTURE  PROTIME-INR    EKG None  Radiology No results found.  Procedures Procedures (including critical care time)  Medications Ordered in ED Medications - No data to display   Initial Impression / Assessment and Plan / ED Course  I have reviewed the triage vital signs and the nursing notes.  Pertinent labs & imaging results that were available during my care of the patient were reviewed by me and considered in my medical decision making (see chart for details).     77 year old male with history of Candida infection of the penis status post Foley catheter placement 2 days ago comes in with gross hematuria.  He does not have any systemic symptoms and her exam is benign. His bladder scan did reveal urinary retention.  Further assessment by the nursing team noted that the Foley catheter was perhaps not inserted into the bladder. We decided to remove his existing Foley catheter in place a 20 French Foley catheter which yielded immediate return.  UA does not reveal any signs of infection.  Clinically we are not concerned for UTI, but the gross hematuria is new therefore urine culture had been sent.  The other possibility for the gross hematuria could be urethral injury.  Final Clinical Impressions(s) / ED Diagnoses   Final diagnoses:  Gross hematuria    ED Discharge Orders  None       Varney Biles, MD 06/12/18 [phone]

## 2018-06-12 NOTE — Discharge Instructions (Addendum)
We saw you in the ER for blood in the urine. The labs are overall reassuring.  The urine does not show any clear evidence of infection. We have replaced your Foley catheter in the ER.  We are discharging you back to the nursing home.  If you start developing nausea, vomiting, fevers, inability to urinate, worsening of the blood in the urine -return to the ER.  We have sent your urine for cultures -if the urine starts growing bacteria we will call you with recommendations on antibiotics.

## 2018-06-12 NOTE — ED Notes (Signed)
Catheter bag not draining.  Bladder scan reveals 582mL urine.

## 2018-06-13 LAB — URINE CULTURE: Culture: NO GROWTH

## 2018-06-14 DIAGNOSIS — Z760 Encounter for issue of repeat prescription: Secondary | ICD-10-CM | POA: Diagnosis not present

## 2018-06-14 DIAGNOSIS — N390 Urinary tract infection, site not specified: Secondary | ICD-10-CM | POA: Diagnosis not present

## 2018-06-14 DIAGNOSIS — E1142 Type 2 diabetes mellitus with diabetic polyneuropathy: Secondary | ICD-10-CM | POA: Diagnosis not present

## 2018-06-14 DIAGNOSIS — R310 Gross hematuria: Secondary | ICD-10-CM | POA: Diagnosis not present

## 2018-06-14 DIAGNOSIS — B961 Klebsiella pneumoniae [K. pneumoniae] as the cause of diseases classified elsewhere: Secondary | ICD-10-CM | POA: Diagnosis not present

## 2018-06-14 DIAGNOSIS — F418 Other specified anxiety disorders: Secondary | ICD-10-CM | POA: Diagnosis not present

## 2018-06-14 DIAGNOSIS — C9110 Chronic lymphocytic leukemia of B-cell type not having achieved remission: Secondary | ICD-10-CM | POA: Diagnosis not present

## 2018-06-14 DIAGNOSIS — L22 Diaper dermatitis: Secondary | ICD-10-CM | POA: Diagnosis not present

## 2018-06-14 DIAGNOSIS — F0390 Unspecified dementia without behavioral disturbance: Secondary | ICD-10-CM | POA: Diagnosis not present

## 2018-06-14 DIAGNOSIS — N492 Inflammatory disorders of scrotum: Secondary | ICD-10-CM | POA: Diagnosis not present

## 2018-06-14 DIAGNOSIS — E11319 Type 2 diabetes mellitus with unspecified diabetic retinopathy without macular edema: Secondary | ICD-10-CM | POA: Diagnosis not present

## 2018-06-15 DIAGNOSIS — N390 Urinary tract infection, site not specified: Secondary | ICD-10-CM | POA: Diagnosis not present

## 2018-06-15 DIAGNOSIS — F0390 Unspecified dementia without behavioral disturbance: Secondary | ICD-10-CM | POA: Diagnosis not present

## 2018-06-15 DIAGNOSIS — B961 Klebsiella pneumoniae [K. pneumoniae] as the cause of diseases classified elsewhere: Secondary | ICD-10-CM | POA: Diagnosis not present

## 2018-06-15 DIAGNOSIS — C9110 Chronic lymphocytic leukemia of B-cell type not having achieved remission: Secondary | ICD-10-CM | POA: Diagnosis not present

## 2018-06-15 DIAGNOSIS — E1142 Type 2 diabetes mellitus with diabetic polyneuropathy: Secondary | ICD-10-CM | POA: Diagnosis not present

## 2018-06-15 DIAGNOSIS — F418 Other specified anxiety disorders: Secondary | ICD-10-CM | POA: Diagnosis not present

## 2018-06-16 ENCOUNTER — Encounter: Payer: Self-pay | Admitting: Psychiatry

## 2018-06-16 ENCOUNTER — Ambulatory Visit: Payer: Self-pay | Admitting: Psychiatry
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
## 2018-06-18 DIAGNOSIS — F418 Other specified anxiety disorders: Secondary | ICD-10-CM | POA: Diagnosis not present

## 2018-06-18 DIAGNOSIS — C9110 Chronic lymphocytic leukemia of B-cell type not having achieved remission: Secondary | ICD-10-CM | POA: Diagnosis not present

## 2018-06-18 DIAGNOSIS — B961 Klebsiella pneumoniae [K. pneumoniae] as the cause of diseases classified elsewhere: Secondary | ICD-10-CM | POA: Diagnosis not present

## 2018-06-18 DIAGNOSIS — N390 Urinary tract infection, site not specified: Secondary | ICD-10-CM | POA: Diagnosis not present

## 2018-06-18 DIAGNOSIS — F0390 Unspecified dementia without behavioral disturbance: Secondary | ICD-10-CM | POA: Diagnosis not present

## 2018-06-18 DIAGNOSIS — E1142 Type 2 diabetes mellitus with diabetic polyneuropathy: Secondary | ICD-10-CM | POA: Diagnosis not present

## 2018-06-21 DIAGNOSIS — B359 Dermatophytosis, unspecified: Secondary | ICD-10-CM | POA: Diagnosis not present

## 2018-06-21 DIAGNOSIS — Z8701 Personal history of pneumonia (recurrent): Secondary | ICD-10-CM | POA: Diagnosis not present

## 2018-06-21 DIAGNOSIS — Z87440 Personal history of urinary (tract) infections: Secondary | ICD-10-CM | POA: Diagnosis not present

## 2018-06-21 DIAGNOSIS — C9110 Chronic lymphocytic leukemia of B-cell type not having achieved remission: Secondary | ICD-10-CM | POA: Diagnosis not present

## 2018-06-21 DIAGNOSIS — F0391 Unspecified dementia with behavioral disturbance: Secondary | ICD-10-CM | POA: Diagnosis not present

## 2018-06-21 DIAGNOSIS — F339 Major depressive disorder, recurrent, unspecified: Secondary | ICD-10-CM | POA: Diagnosis not present

## 2018-06-21 DIAGNOSIS — B961 Klebsiella pneumoniae [K. pneumoniae] as the cause of diseases classified elsewhere: Secondary | ICD-10-CM | POA: Diagnosis not present

## 2018-06-21 DIAGNOSIS — Z9181 History of falling: Secondary | ICD-10-CM | POA: Diagnosis not present

## 2018-06-21 DIAGNOSIS — Z466 Encounter for fitting and adjustment of urinary device: Secondary | ICD-10-CM | POA: Diagnosis not present

## 2018-06-21 DIAGNOSIS — M797 Fibromyalgia: Secondary | ICD-10-CM | POA: Diagnosis not present

## 2018-06-21 DIAGNOSIS — Z7982 Long term (current) use of aspirin: Secondary | ICD-10-CM | POA: Diagnosis not present

## 2018-06-21 DIAGNOSIS — Z794 Long term (current) use of insulin: Secondary | ICD-10-CM | POA: Diagnosis not present

## 2018-06-21 DIAGNOSIS — E7800 Pure hypercholesterolemia, unspecified: Secondary | ICD-10-CM | POA: Diagnosis not present

## 2018-06-21 DIAGNOSIS — E1142 Type 2 diabetes mellitus with diabetic polyneuropathy: Secondary | ICD-10-CM | POA: Diagnosis not present

## 2018-06-21 DIAGNOSIS — N390 Urinary tract infection, site not specified: Secondary | ICD-10-CM | POA: Diagnosis not present

## 2018-06-21 DIAGNOSIS — G4700 Insomnia, unspecified: Secondary | ICD-10-CM | POA: Diagnosis not present

## 2018-06-21 DIAGNOSIS — S3130XD Unspecified open wound of scrotum and testes, subsequent encounter: Secondary | ICD-10-CM | POA: Diagnosis not present

## 2018-06-21 DIAGNOSIS — F418 Other specified anxiety disorders: Secondary | ICD-10-CM | POA: Diagnosis not present

## 2018-06-21 DIAGNOSIS — I10 Essential (primary) hypertension: Secondary | ICD-10-CM | POA: Diagnosis not present

## 2018-06-21 DIAGNOSIS — N400 Enlarged prostate without lower urinary tract symptoms: Secondary | ICD-10-CM | POA: Diagnosis not present

## 2018-06-21 DIAGNOSIS — K219 Gastro-esophageal reflux disease without esophagitis: Secondary | ICD-10-CM | POA: Diagnosis not present

## 2018-06-21 DIAGNOSIS — B372 Candidiasis of skin and nail: Secondary | ICD-10-CM | POA: Diagnosis not present

## 2018-06-21 DIAGNOSIS — E11319 Type 2 diabetes mellitus with unspecified diabetic retinopathy without macular edema: Secondary | ICD-10-CM | POA: Diagnosis not present

## 2018-06-25 DIAGNOSIS — N390 Urinary tract infection, site not specified: Secondary | ICD-10-CM | POA: Diagnosis not present

## 2018-06-25 DIAGNOSIS — B961 Klebsiella pneumoniae [K. pneumoniae] as the cause of diseases classified elsewhere: Secondary | ICD-10-CM | POA: Diagnosis not present

## 2018-06-25 DIAGNOSIS — B372 Candidiasis of skin and nail: Secondary | ICD-10-CM | POA: Diagnosis not present

## 2018-06-25 DIAGNOSIS — E1142 Type 2 diabetes mellitus with diabetic polyneuropathy: Secondary | ICD-10-CM | POA: Diagnosis not present

## 2018-06-25 DIAGNOSIS — F0391 Unspecified dementia with behavioral disturbance: Secondary | ICD-10-CM | POA: Diagnosis not present

## 2018-06-25 DIAGNOSIS — S3130XD Unspecified open wound of scrotum and testes, subsequent encounter: Secondary | ICD-10-CM | POA: Diagnosis not present

## 2018-06-28 DIAGNOSIS — F0281 Dementia in other diseases classified elsewhere with behavioral disturbance: Secondary | ICD-10-CM | POA: Diagnosis not present

## 2018-06-28 DIAGNOSIS — G300 Alzheimer's disease with early onset: Secondary | ICD-10-CM | POA: Diagnosis not present

## 2018-06-28 DIAGNOSIS — N492 Inflammatory disorders of scrotum: Secondary | ICD-10-CM | POA: Diagnosis not present

## 2018-06-28 DIAGNOSIS — L22 Diaper dermatitis: Secondary | ICD-10-CM | POA: Diagnosis not present

## 2018-06-28 DIAGNOSIS — F418 Other specified anxiety disorders: Secondary | ICD-10-CM | POA: Diagnosis not present

## 2018-06-28 DIAGNOSIS — E11319 Type 2 diabetes mellitus with unspecified diabetic retinopathy without macular edema: Secondary | ICD-10-CM | POA: Diagnosis not present

## 2018-06-29 DIAGNOSIS — G301 Alzheimer's disease with late onset: Secondary | ICD-10-CM | POA: Diagnosis not present

## 2018-06-29 DIAGNOSIS — F419 Anxiety disorder, unspecified: Secondary | ICD-10-CM | POA: Diagnosis not present

## 2018-06-29 DIAGNOSIS — G4700 Insomnia, unspecified: Secondary | ICD-10-CM | POA: Diagnosis not present

## 2018-06-29 DIAGNOSIS — F331 Major depressive disorder, recurrent, moderate: Secondary | ICD-10-CM | POA: Diagnosis not present

## 2018-06-29 DIAGNOSIS — F4324 Adjustment disorder with disturbance of conduct: Secondary | ICD-10-CM | POA: Diagnosis not present

## 2018-07-01 DIAGNOSIS — N390 Urinary tract infection, site not specified: Secondary | ICD-10-CM | POA: Diagnosis not present

## 2018-07-01 DIAGNOSIS — F0391 Unspecified dementia with behavioral disturbance: Secondary | ICD-10-CM | POA: Diagnosis not present

## 2018-07-01 DIAGNOSIS — B961 Klebsiella pneumoniae [K. pneumoniae] as the cause of diseases classified elsewhere: Secondary | ICD-10-CM | POA: Diagnosis not present

## 2018-07-01 DIAGNOSIS — B372 Candidiasis of skin and nail: Secondary | ICD-10-CM | POA: Diagnosis not present

## 2018-07-01 DIAGNOSIS — E1142 Type 2 diabetes mellitus with diabetic polyneuropathy: Secondary | ICD-10-CM | POA: Diagnosis not present

## 2018-07-01 DIAGNOSIS — S3130XD Unspecified open wound of scrotum and testes, subsequent encounter: Secondary | ICD-10-CM | POA: Diagnosis not present

## 2018-07-02 ENCOUNTER — Other Ambulatory Visit: Payer: Self-pay

## 2018-07-02 ENCOUNTER — Encounter (HOSPITAL_COMMUNITY): Payer: Self-pay

## 2018-07-02 ENCOUNTER — Inpatient Hospital Stay (HOSPITAL_COMMUNITY)
Admission: EM | Admit: 2018-07-02 | Discharge: 2018-07-05 | DRG: 699 | Disposition: A | Discharge status: Skilled Nursing Facility | Payer: Medicare Other | Attending: Internal Medicine | Admitting: Internal Medicine

## 2018-07-02 DIAGNOSIS — Z79891 Long term (current) use of opiate analgesic: Secondary | ICD-10-CM

## 2018-07-02 DIAGNOSIS — G9340 Encephalopathy, unspecified: Secondary | ICD-10-CM | POA: Diagnosis not present

## 2018-07-02 DIAGNOSIS — F0391 Unspecified dementia with behavioral disturbance: Secondary | ICD-10-CM | POA: Diagnosis present

## 2018-07-02 DIAGNOSIS — K219 Gastro-esophageal reflux disease without esophagitis: Secondary | ICD-10-CM | POA: Diagnosis present

## 2018-07-02 DIAGNOSIS — R339 Retention of urine, unspecified: Secondary | ICD-10-CM | POA: Diagnosis present

## 2018-07-02 DIAGNOSIS — L89306 Pressure-induced deep tissue damage of unspecified buttock: Secondary | ICD-10-CM | POA: Diagnosis present

## 2018-07-02 DIAGNOSIS — E785 Hyperlipidemia, unspecified: Secondary | ICD-10-CM | POA: Diagnosis present

## 2018-07-02 DIAGNOSIS — R4182 Altered mental status, unspecified: Secondary | ICD-10-CM | POA: Diagnosis not present

## 2018-07-02 DIAGNOSIS — M797 Fibromyalgia: Secondary | ICD-10-CM | POA: Diagnosis present

## 2018-07-02 DIAGNOSIS — Z91013 Allergy to seafood: Secondary | ICD-10-CM

## 2018-07-02 DIAGNOSIS — C9110 Chronic lymphocytic leukemia of B-cell type not having achieved remission: Secondary | ICD-10-CM | POA: Diagnosis present

## 2018-07-02 DIAGNOSIS — T83518A Infection and inflammatory reaction due to other urinary catheter, initial encounter: Principal | ICD-10-CM | POA: Diagnosis present

## 2018-07-02 DIAGNOSIS — Z794 Long term (current) use of insulin: Secondary | ICD-10-CM

## 2018-07-02 DIAGNOSIS — Y846 Urinary catheterization as the cause of abnormal reaction of the patient, or of later complication, without mention of misadventure at the time of the procedure: Secondary | ICD-10-CM | POA: Diagnosis present

## 2018-07-02 DIAGNOSIS — F329 Major depressive disorder, single episode, unspecified: Secondary | ICD-10-CM | POA: Diagnosis present

## 2018-07-02 DIAGNOSIS — E1165 Type 2 diabetes mellitus with hyperglycemia: Secondary | ICD-10-CM | POA: Diagnosis present

## 2018-07-02 DIAGNOSIS — F419 Anxiety disorder, unspecified: Secondary | ICD-10-CM | POA: Diagnosis present

## 2018-07-02 DIAGNOSIS — N390 Urinary tract infection, site not specified: Secondary | ICD-10-CM | POA: Diagnosis present

## 2018-07-02 DIAGNOSIS — N400 Enlarged prostate without lower urinary tract symptoms: Secondary | ICD-10-CM | POA: Diagnosis present

## 2018-07-02 DIAGNOSIS — Z91030 Bee allergy status: Secondary | ICD-10-CM

## 2018-07-02 DIAGNOSIS — Z79899 Other long term (current) drug therapy: Secondary | ICD-10-CM

## 2018-07-02 DIAGNOSIS — E119 Type 2 diabetes mellitus without complications: Secondary | ICD-10-CM | POA: Diagnosis present

## 2018-07-02 DIAGNOSIS — E118 Type 2 diabetes mellitus with unspecified complications: Secondary | ICD-10-CM

## 2018-07-02 DIAGNOSIS — Z9109 Other allergy status, other than to drugs and biological substances: Secondary | ICD-10-CM

## 2018-07-02 DIAGNOSIS — Z833 Family history of diabetes mellitus: Secondary | ICD-10-CM

## 2018-07-02 DIAGNOSIS — Z981 Arthrodesis status: Secondary | ICD-10-CM

## 2018-07-02 DIAGNOSIS — F32A Depression, unspecified: Secondary | ICD-10-CM | POA: Diagnosis present

## 2018-07-02 DIAGNOSIS — R457 State of emotional shock and stress, unspecified: Secondary | ICD-10-CM | POA: Diagnosis not present

## 2018-07-02 DIAGNOSIS — Z7982 Long term (current) use of aspirin: Secondary | ICD-10-CM

## 2018-07-02 DIAGNOSIS — B9562 Methicillin resistant Staphylococcus aureus infection as the cause of diseases classified elsewhere: Secondary | ICD-10-CM | POA: Diagnosis present

## 2018-07-02 DIAGNOSIS — F418 Other specified anxiety disorders: Secondary | ICD-10-CM | POA: Diagnosis present

## 2018-07-02 DIAGNOSIS — Z888 Allergy status to other drugs, medicaments and biological substances status: Secondary | ICD-10-CM

## 2018-07-02 DIAGNOSIS — F0390 Unspecified dementia without behavioral disturbance: Secondary | ICD-10-CM | POA: Diagnosis present

## 2018-07-02 DIAGNOSIS — Z791 Long term (current) use of non-steroidal anti-inflammatories (NSAID): Secondary | ICD-10-CM

## 2018-07-02 LAB — URINALYSIS, ROUTINE W REFLEX MICROSCOPIC
Bilirubin Urine: NEGATIVE
Ketones, ur: NEGATIVE mg/dL
Nitrite: NEGATIVE
Protein, ur: NEGATIVE mg/dL
Specific Gravity, Urine: 1.022 (ref 1.005–1.030)
WBC, UA: >50 WBC/hpf — ABNORMAL HIGH (ref 0–5)
pH: 7 (ref 5.0–8.0)

## 2018-07-02 LAB — COMPREHENSIVE METABOLIC PANEL
ALK PHOS: 63 U/L (ref 38–126)
ALT: 33 U/L (ref 0–44)
AST: 28 U/L (ref 15–41)
Albumin: 4.2 g/dL (ref 3.5–5.0)
Anion gap: 10 (ref 5–15)
BUN: 33 mg/dL — AB (ref 8–23)
CALCIUM: 9.5 mg/dL (ref 8.9–10.3)
CO2: 28 mmol/L (ref 22–32)
CREATININE: 0.87 mg/dL (ref 0.61–1.24)
Chloride: 99 mmol/L (ref 98–111)
GFR calc Af Amer: >60 mL/min (ref >60)
GFR calc non Af Amer: >60 mL/min (ref >60)
Glucose, Bld: 440 mg/dL — ABNORMAL HIGH (ref 70–99)
Potassium: 4.9 mmol/L (ref 3.5–5.1)
Sodium: 137 mmol/L (ref 135–145)
Total Bilirubin: 0.7 mg/dL (ref 0.3–1.2)
Total Protein: 6.9 g/dL (ref 6.5–8.1)

## 2018-07-02 LAB — CBC
HCT: 43.3 % (ref 39.0–52.0)
Hemoglobin: 14 g/dL (ref 13.0–17.0)
MCH: 30.5 pg (ref 26.0–34.0)
MCHC: 32.3 g/dL (ref 30.0–36.0)
MCV: 94.3 fL (ref 80.0–100.0)
Platelets: 239 10*3/uL (ref 150–400)
RBC: 4.59 MIL/uL (ref 4.22–5.81)
RDW: 12.7 % (ref 11.5–15.5)
WBC: 12.2 10*3/uL — ABNORMAL HIGH (ref 4.0–10.5)
nRBC: 0 % (ref 0.0–0.2)

## 2018-07-02 LAB — BLOOD GAS, VENOUS
Acid-Base Excess: 5.2 mmol/L — ABNORMAL HIGH (ref 0.0–2.0)
Bicarbonate: 29.5 mmol/L — ABNORMAL HIGH (ref 20.0–28.0)
O2 Saturation: 68.4 %
PATIENT TEMPERATURE: 98.6
pCO2, Ven: 43.4 mmHg — ABNORMAL LOW (ref 44.0–60.0)
pH, Ven: 7.447 — ABNORMAL HIGH (ref 7.250–7.430)
pO2, Ven: 36.2 mmHg (ref 32.0–45.0)

## 2018-07-02 LAB — I-STAT CG4 LACTIC ACID, ED
Lactic Acid, Venous: 1.7 mmol/L (ref 0.5–1.9)
Lactic Acid, Venous: 1.62 mmol/L (ref 0.5–1.9)

## 2018-07-02 LAB — CBG MONITORING, ED: Glucose-Capillary: 599 mg/dL (ref 70–99)

## 2018-07-02 MED ORDER — SODIUM CHLORIDE 0.9 % IV SOLN
2.0000 g | Freq: Once | INTRAVENOUS | Status: AC
  Administered 2018-07-03: 2 g via INTRAVENOUS
  Filled 2018-07-02: qty 2

## 2018-07-02 MED ORDER — INSULIN ASPART 100 UNIT/ML ~~LOC~~ SOLN
0.0000 [IU] | Freq: Three times a day (TID) | SUBCUTANEOUS | Status: DC
  Administered 2018-07-04: 1 [IU] via SUBCUTANEOUS
  Administered 2018-07-05: 2 [IU] via SUBCUTANEOUS

## 2018-07-02 MED ORDER — INSULIN ASPART 100 UNIT/ML ~~LOC~~ SOLN
0.0000 [IU] | Freq: Every day | SUBCUTANEOUS | Status: DC
  Administered 2018-07-04: 2 [IU] via SUBCUTANEOUS

## 2018-07-02 MED ORDER — LORAZEPAM 2 MG/ML IJ SOLN
1.0000 mg | Freq: Once | INTRAMUSCULAR | Status: AC
  Administered 2018-07-02: 1 mg via INTRAVENOUS
  Filled 2018-07-02: qty 1

## 2018-07-02 MED ORDER — SODIUM CHLORIDE 0.9 % IV SOLN
Freq: Once | INTRAVENOUS | Status: AC
  Administered 2018-07-02: 19:00:00 via INTRAVENOUS

## 2018-07-02 MED ORDER — INSULIN ASPART 100 UNIT/ML ~~LOC~~ SOLN
10.0000 [IU] | Freq: Once | SUBCUTANEOUS | Status: AC
  Administered 2018-07-02: 10 [IU] via SUBCUTANEOUS
  Filled 2018-07-02: qty 1

## 2018-07-02 MED ORDER — INSULIN DETEMIR 100 UNIT/ML ~~LOC~~ SOLN
25.0000 [IU] | Freq: Every day | SUBCUTANEOUS | Status: DC
  Administered 2018-07-03 – 2018-07-04 (×3): 25 [IU] via SUBCUTANEOUS
  Filled 2018-07-02 (×4): qty 0.25

## 2018-07-02 MED ORDER — QUETIAPINE FUMARATE 25 MG PO TABS
25.0000 mg | ORAL_TABLET | Freq: Every day | ORAL | Status: DC
  Administered 2018-07-02: 25 mg via ORAL
  Filled 2018-07-02: qty 1

## 2018-07-02 MED ORDER — SODIUM CHLORIDE 0.9 % IV SOLN
1.0000 g | Freq: Once | INTRAVENOUS | Status: AC
  Administered 2018-07-02: 1 g via INTRAVENOUS
  Filled 2018-07-02: qty 10

## 2018-07-02 NOTE — ED Notes (Signed)
Caregiver called this Probation officer into room stating patient had to urinate. On arrival into room, patient standing out of bed, IV fluids unhooked and dripping on the floor and bed/patient wet with saline. Bed and patient gown changed. Patient assisted with urinal and condom cath placed. Patient placed back in bed and provided with warm blankets.

## 2018-07-02 NOTE — ED Triage Notes (Signed)
Per EMS, pt from [facility] has not been acting himself for the past couple days per staff. Pt was in his room, naked, peeing on the floor today. Pt was alert and oriented x 4 with EMS. Pt was eating upon EMS arrival, had CBG of 483.   BP 132/88 HR 80 RR 16

## 2018-07-02 NOTE — ED Notes (Signed)
Bed: WA16 Expected date:  Expected time:  Means of arrival:  Comments: EMS- UTI? 

## 2018-07-02 NOTE — ED Notes (Signed)
ED TO INPATIENT HANDOFF REPORT  Name/Age/Gender Hayden [name] 78 y.o. male  Code Status Code Status History    Date Active Date Inactive Code Status Order ID Comments User Context   06/05/2018 1417 06/06/2018 0328 Full Code [id number]  Lacretia Leigh, MD ED   05/22/2018 2000 05/25/2018 1748 DNR [id number]  Neila Gear, NP Inpatient   05/22/2018 1635 05/22/2018 2000 Full Code [id number]  Shelly Coss, MD ED   04/17/2018 1924 04/20/2018 1822 DNR [id number]  [name], MD Inpatient   03/08/2018 1941 03/11/2018 2105 Full Code [id number]  Norval Morton, MD ED   09/06/2017 2211 09/10/2017 2151 Full Code [id number]  Jani Gravel, MD ED      Home/SNF/Other Nursing Home  Chief Complaint hyperglycemia  Level of Care/Admitting Diagnosis ED Disposition    ED Disposition Condition [facility] Area: [facility] [100102]  Level of Care: Med-Surg [16]  Diagnosis: Acute encephalopathy [767341]  Admitting Physician: Vianne Bulls [[id number]]  Attending Physician: Vianne Bulls [[id number]]  PT Class (Do Not Modify): Observation [104]  PT Acc Code (Do Not Modify): Observation [10022]       Medical History Past Medical History:  Diagnosis Date  . Candidiasis   . Depression   . Diabetes mellitus   . Enlarged prostate   . Fibromyalgia   . GERD (gastroesophageal reflux disease)    reflux intermittent  . Hyperlipidemia   . 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 Leukemia ([place]) chronic lymphocytic   2008 diagnosed-monitored Dr     Allergies Allergies  Allergen Reactions  . Bee Venom Other (See Comments)    Bump where stung and took days to go away  . Glucophage [Metformin] Diarrhea    "i will never take it again -stool incontinent "  . Shellfish Allergy Swelling  . Grass Extracts [Gramineae Pollens]   . Tobacco [Nicotiana Tabacum]     IV Location/Drains/Wounds Patient Lines/Drains/Airways Status    Active Line/Drains/Airways    Name:   Placement date:   Placement time:   Site:   Days:   Peripheral IV 07/02/18 Left Forearm   07/02/18    1812    Forearm   less than 1   Pressure Injury 05/22/18 Stage I -  Intact skin with non-blanchable redness of a localized area usually over a bony prominence.   05/22/18    2100     41   Wound / Incision (Open or Dehisced) 03/08/18 Other (Comment) Penis Left;Lateral small red sore    03/08/18    2100    Penis   116          Labs/Imaging Results for orders placed or performed during the hospital encounter of 07/02/18 (from the past 48 hour(s))  Comprehensive metabolic panel     Status: Abnormal   Collection Time: 07/02/18  6:12 PM  Result Value Ref Range   Sodium 137 135 - 145 mmol/L   Potassium 4.9 3.5 - 5.1 mmol/L   Chloride 99 98 - 111 mmol/L   CO2 28 22 - 32 mmol/L   Glucose, Bld 440 (H) 70 - 99 mg/dL   BUN 33 (H) 8 - 23 mg/dL   Creatinine, Ser 0.87 0.61 - 1.24 mg/dL   Calcium 9.5 8.9 - 10.3 mg/dL   Total Protein 6.9 6.5 - 8.1 g/dL   Albumin 4.2 3.5 - 5.0 g/dL   AST 28 15 - 41 U/L  ALT 33 0 - 44 U/L   Alkaline Phosphatase 63 38 - 126 U/L   Total Bilirubin 0.7 0.3 - 1.2 mg/dL   GFR calc non Af Amer >60 >60 mL/min   GFR calc Af Amer >60 >60 mL/min   Anion gap 10 5 - 15    Comment: Performed at [facility], [place] [address] [place], [place] [address]  CBC     Status: Abnormal   Collection Time: 07/02/18  6:12 PM  Result Value Ref Range   WBC 12.2 (H) 4.0 - 10.5 K/uL   RBC 4.59 4.22 - 5.81 MIL/uL   Hemoglobin 14.0 13.0 - 17.0 g/dL   HCT 43.3 39.0 - 52.0 %   MCV 94.3 80.0 - 100.0 fL   MCH 30.5 26.0 - 34.0 pg   MCHC 32.3 30.0 - 36.0 g/dL   RDW 12.7 11.5 - 15.5 %   Platelets 239 150 - 400 K/uL   nRBC 0.0 0.0 - 0.2 %    Comment: Performed at [facility], [place] [address], [place], [place] [address]  CBG monitoring, ED     Status: Abnormal   Collection Time: 07/02/18  6:16 PM  Result Value Ref  Range   Glucose-Capillary 599 (HH) 70 - 99 mg/dL  Urinalysis, Routine w reflex microscopic     Status: Abnormal   Collection Time: 07/02/18  6:39 PM  Result Value Ref Range   Color, Urine STRAW (A) YELLOW   APPearance CLOUDY (A) CLEAR   Specific Gravity, Urine 1.022 1.005 - 1.030   pH 7.0 5.0 - 8.0   Glucose, UA >=500 (A) NEGATIVE mg/dL   Hgb urine dipstick SMALL (A) NEGATIVE   Bilirubin Urine NEGATIVE NEGATIVE   Ketones, ur NEGATIVE NEGATIVE mg/dL   Protein, ur NEGATIVE NEGATIVE mg/dL   Nitrite NEGATIVE NEGATIVE   Leukocytes, UA LARGE (A) NEGATIVE   RBC / HPF 0-5 0 - 5 RBC/hpf   WBC, UA >50 (H) 0 - 5 WBC/hpf   Bacteria, UA RARE (A) NONE SEEN    Comment: Performed at [facility], [place] [address] [place], [place] [address]  Blood gas, venous     Status: Abnormal   Collection Time: 07/02/18  7:00 PM  Result Value Ref Range   pH, Ven 7.447 (H) 7.250 - 7.430   pCO2, Ven 43.4 (L) 44.0 - 60.0 mmHg   pO2, Ven 36.2 32.0 - 45.0 mmHg   Bicarbonate 29.5 (H) 20.0 - 28.0 mmol/L   Acid-Base Excess 5.2 (H) 0.0 - 2.0 mmol/L   O2 Saturation 68.4 %   Patient temperature 98.6    Collection site VEIN    Drawn by COLLECTED BY NURSE    Sample type VENOUS     Comment: Performed at [facility] [address] [place], [place] [address]  I-Stat CG4 Lactic Acid, ED     Status: None   Collection Time: 07/02/18  7:06 PM  Result Value Ref Range   Lactic Acid, Venous 1.70 0.5 - 1.9 mmol/L  I-Stat CG4 Lactic Acid, ED     Status: None   Collection Time: 07/02/18 10:09 PM  Result Value Ref Range   Lactic Acid, Venous 1.62 0.5 - 1.9 mmol/L   No results found. None  Pending Labs Unresulted Labs (From admission, onward)    Start     Ordered   07/02/18 2116  Valproic acid level  ONCE - STAT,   STAT     07/02/18 2117  07/02/18 2115  Urine culture  ONCE - STAT,   STAT     07/02/18 2117          Vitals/Pain Today's Vitals   07/02/18 1946 07/02/18 2000  07/02/18 2114 07/02/18 2200  BP: (!) 146/67 (!) 146/65 (!) 155/79 135/66  Pulse: 90 83 88 77  Resp: 16 13 16 16   Temp:      TempSrc:      SpO2: 99% 98% 100% 97%  PainSc:        Isolation Precautions No active isolations  Medications Medications  cefTRIAXone (ROCEPHIN) 1 g in sodium chloride 0.9 % 100 mL IVPB (1 g Intravenous New Bag/Given 07/02/18 2159)  QUEtiapine (SEROQUEL) tablet 25 mg (25 mg Oral Given 07/02/18 2156)  insulin detemir (LEVEMIR) injection 25 Units (has no administration in time range)  insulin aspart (novoLOG) injection 0-9 Units (has no administration in time range)  insulin aspart (novoLOG) injection 0-5 Units (has no administration in time range)  0.9 %  sodium chloride infusion ( Intravenous Stopped 07/02/18 2139)  insulin aspart (novoLOG) injection 10 Units (10 Units Subcutaneous Given 07/02/18 2157)  LORazepam (ATIVAN) injection 1 mg (1 mg Intravenous Given 07/02/18 2156)    Mobility walks with assistance

## 2018-07-02 NOTE — ED Notes (Signed)
Pt has gotten out of bed very confused x2, ripped gown and cardiac monitoring off. Pt is very unsteady while ambulating in room. Judson Roch, RN made aware.

## 2018-07-02 NOTE — ED Notes (Signed)
Pt found out of bed walking around room with no gown on.

## 2018-07-02 NOTE — ED Provider Notes (Signed)
[facility] DEPT Provider Note   CSN: [id number] Arrival date & time: 07/02/18  1746     History   Chief Complaint Chief Complaint  Patient presents with  . Altered Mental Status  . Hyperglycemia    HPI Hayden [name] is a 78 y.o. male.  HPI Patient is sent from [facility] skilled care nursing facility for confusion and behavioral change with hyperglycemia.  Patient's care nurse who is familiar with him came to the hospital for additional history.  She reports that they were concerned because he exhibited some unusual behaviors such as taking off all of his clothes and wandering around in his room.  She does report that at times patient does exhibit more pronounced features of dementia than others.  Patient can be quite lucid but when he is ill, will exhibit more pronounced signs of confusion and dementia.  Patient had been seen in the emergency department in December after he had had a Foley catheter for urinary retention.  At that time, he was having hematuria which was felt to be due to urethral injury. Past Medical History:  Diagnosis Date  . Candidiasis   . Depression   . Diabetes mellitus   . Enlarged prostate   . Fibromyalgia   . GERD (gastroesophageal reflux disease)    reflux intermittent  . Hyperlipidemia   . 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 Pressure injury of skin 05/23/2018  . Cellulitis 04/17/2018  . Cellulitis, scrotum 03/09/2018  . Cellulitis of male genitalia 03/08/2018  . Depression 03/08/2018  . BPH (benign prostatic hyperplasia) 03/08/2018  . Leukocytosis 03/08/2018  . Mild dementia ([place]) 10/23/2017  . UTI (urinary tract infection) 09/06/2017  . Hyperglycemia 09/06/2017  . ARF (acute  renal failure) ([place]) 09/06/2017  . Altered mental status 09/06/2017  . Type 2 diabetes mellitus with complication ([place]) 11/94/1740  . 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 ADENOIDECTOMY    . CARPAL TUNNEL RELEASE     rt hand  . CATARACT EXTRACTION, BILATERAL    . CERVICAL FUSION  1998  . CYSTOSCOPY  07/26/2012   Procedure: CYSTOSCOPY;  Surgeon: Ailene Rud, MD;  Location: [facility];  Service: Urology;  Laterality: N/A;  DIAGNOSTIC CYSTO PROSTATE ULTRASOUND      . PROSTATE BIOPSY  07/26/2012   Procedure: PROSTATE BIOPSY;  Surgeon: Ailene Rud, MD;  Location: [facility];  Service: Urology;  Laterality: N/A;  . retinal micro aneurysms    . tonsil    . VASECTOMY    . VITRECTOMY          Home Medications    Prior to Admission medications   Medication Sig Start Date End Date Taking? Authorizing Provider  aspirin 81 MG tablet Take 81 mg by mouth every morning.     [provider]  buPROPion (WELLBUTRIN XL) 300 MG 24 hr tablet Take 300 mg by mouth every morning.     [provider]  cephALEXin (KEFLEX) 500 MG capsule Take 500 mg by mouth 3 (three) times daily.    [provider]  cetirizine (ZYRTEC) 10 MG tablet Take 10 mg by mouth daily as needed for allergies.  [provider]  Cholecalciferol (VITAMIN D3) 1000 UNITS CAPS Take 1,000 Units by mouth daily after breakfast.     [provider]  DIMETHICONE-ZINC OXIDE EX Apply 1 application topically See admin instructions. Apply topically to buttocks and scrotum after each incontinence episode    [provider]  divalproex (DEPAKOTE SPRINKLE) 125 MG capsule Take by mouth 2 (two) times daily.    [provider]  docusate sodium (COLACE) 100 MG capsule Take 100 mg by mouth 2 (two) times daily.    [provider]  donepezil (ARICEPT) 10 MG  tablet Take 1 tablet (10 mg total) by mouth at bedtime. 05/03/18   Cameron Sprang, MD  EPINEPHrine (EPIPEN JR) 0.15 MG/0.3ML injection Inject 0.15 mg into the muscle as needed for anaphylaxis.    [provider]  fish oil-omega-3 fatty acids 1000 MG capsule Take 1 g by mouth 2 (two) times daily.     [provider]  fluconazole (DIFLUCAN) 200 MG tablet Take 200 mg by mouth daily.    [provider]  HYDROcodone-acetaminophen (NORCO/VICODIN) 5-325 MG tablet Take 1 tablet by mouth every 6 (six) hours as needed for moderate pain.    [provider]  insulin lispro (HUMALOG KWIKPEN) 100 UNIT/ML KiwkPen Inject 4-10 Units into the skin 3 (three) times daily before meals.     [provider]  LEVEMIR FLEXTOUCH 100 UNIT/ML Pen Inject 36 Units into the skin at bedtime.  07/16/15   [provider]  lovastatin (MEVACOR) 20 MG tablet Take 20 mg by mouth daily.    [provider]  naproxen sodium (ALEVE) 220 MG tablet Take 220 mg by mouth daily as needed.    [provider]  neomycin-bacitracin-polymyxin (NEOSPORIN) OINT Apply 1 application topically as needed for wound care (left shoulder).    [provider]  nystatin cream (MYCOSTATIN) Apply to affected area 2 times daily Patient taking differently: Apply 1 application topically 2 (two) times daily.  04/23/18   [name], PA-C  QUEtiapine (SEROQUEL) 25 MG tablet Take 1/2 tablet in AM, 1 tablet in PM Patient taking differently: Take 12.5-25 mg by mouth See admin instructions. Take 1/2 tablet in AM, 1 tablet in PM 05/03/18   Cameron Sprang, MD  tamsulosin (FLOMAX) 0.4 MG CAPS capsule Take 0.4 mg by mouth every evening.     [provider]  tolnaftate (TINACTIN) 1 % cream Apply 1 application topically 2 (two) times daily. 05/27/18   Isla Pence, MD  traZODone (DESYREL) 100 MG tablet Take 150 mg by mouth at bedtime.    [provider]  vitamin B-12  (CYANOCOBALAMIN) 1000 MCG tablet Take 1,000 mcg by mouth every morning.     [provider]  calcium citrate-vitamin D (CITRACAL+D) 315-200 MG-UNIT per tablet Take 1 tablet by mouth daily.  09/04/11  [provider]  terazosin (HYTRIN) 5 MG capsule Take 5 mg by mouth at bedtime.  09/04/11  [provider]    Family History Family History  Problem Relation Age of Onset  . Diabetes Father   . Diabetes Sister   . Diabetes Brother     Social History Social History   Tobacco Use  . Smoking status: Never Smoker  . Smokeless tobacco: Never Used  Substance Use Topics  . Alcohol use: No    Frequency: Never  . Drug use: No     Allergies   Bee venom; Glucophage [metformin]; Shellfish allergy; Grass extracts [gramineae pollens]; and Tobacco [place]  tabacum]   Review of Systems Review of Systems 10 Systems reviewed and are negative for acute change except as noted in the HPI.   Physical Exam Updated Vital Signs BP (!) 155/79 (BP Location: Left Arm) Comment: Simultaneous filing. User may not have seen previous data.  Pulse 88 Comment: Simultaneous filing. User may not have seen previous data.  Temp 97.8 F (36.6 C) (Oral)   Resp 16 Comment: Simultaneous filing. User may not have seen previous data.  SpO2 100% Comment: Simultaneous filing. User may not have seen previous data.  Physical Exam Constitutional:      Comments: Patient is alert.  He has no respiratory distress.  HENT:     Head: Normocephalic and atraumatic.     Mouth/Throat:     Mouth: Mucous membranes are moist.  Eyes:     Extraocular Movements: Extraocular movements intact.  Cardiovascular:     Rate and Rhythm: Normal rate and regular rhythm.  Pulmonary:     Effort: Pulmonary effort is normal.     Breath sounds: Normal breath sounds.  Abdominal:     General: There is no distension.     Palpations: Abdomen is soft.     Tenderness: There is no abdominal tenderness. There is no  guarding.  Genitourinary:    Penis: Normal.   Musculoskeletal: Normal range of motion.        General: No swelling, tenderness, deformity or signs of injury.     Right lower leg: No edema.     Left lower leg: No edema.  Skin:    General: Skin is warm and dry.  Neurological:     Cranial Nerves: No cranial nerve deficit.     Coordination: Coordination normal.  Psychiatric:        Mood and Affect: Mood normal.      ED Treatments / Results  Labs (all labs ordered are listed, but only abnormal results are displayed) Labs Reviewed  COMPREHENSIVE METABOLIC PANEL - Abnormal; Notable for the following components:      Result Value   Glucose, Bld 440 (*)    BUN 33 (*)    All other components within normal limits  CBC - Abnormal; Notable for the following components:   WBC 12.2 (*)    All other components within normal limits  BLOOD GAS, VENOUS - Abnormal; Notable for the following components:   pH, Ven 7.447 (*)    pCO2, Ven 43.4 (*)    Bicarbonate 29.5 (*)    Acid-Base Excess 5.2 (*)    All other components within normal limits  URINALYSIS, ROUTINE W REFLEX MICROSCOPIC - Abnormal; Notable for the following components:   Color, Urine STRAW (*)    APPearance CLOUDY (*)    Glucose, UA >=500 (*)    Hgb urine dipstick SMALL (*)    Leukocytes, UA LARGE (*)    WBC, UA >50 (*)    Bacteria, UA RARE (*)    All other components within normal limits  CBG MONITORING, ED - Abnormal; Notable for the following components:   Glucose-Capillary 599 (*)    All other components within normal limits  URINE CULTURE  VALPROIC ACID LEVEL  I-STAT CG4 LACTIC ACID, ED  I-STAT CG4 LACTIC ACID, ED    EKG None  Radiology No results found.  Procedures Procedures (including critical care time)  Medications Ordered in ED Medications  cefTRIAXone (ROCEPHIN) 1 g in sodium chloride 0.9 % 100 mL IVPB (1 g Intravenous New Bag/Given 07/02/18 2159)  QUEtiapine (  SEROQUEL) tablet 25 mg (25 mg Oral Given  07/02/18 2156)  0.9 %  sodium chloride infusion ( Intravenous Stopped 07/02/18 2139)  insulin aspart (novoLOG) injection 10 Units (10 Units Subcutaneous Given 07/02/18 2157)  LORazepam (ATIVAN) injection 1 mg (1 mg Intravenous Given 07/02/18 2156)     Initial Impression / Assessment and Plan / ED Course  I have reviewed the triage vital signs and the nursing notes.  Pertinent labs & imaging results that were available during my care of the patient were reviewed by me and considered in my medical decision making (see chart for details).    Patient presents for hyperglycemia and altered mental status.  Patient has UTI.  This appears to have developed since he had a catheter approximately 3 weeks ago.  At that time there was no significant amount of leukocytes in the urine and culture negative.  Today patient has significant leukocytosis of the urine.  I suspect this is the etiology for hyperglycemia and mental status change.  Will initiate Rocephin and plan for admission with administration of insulin and patient's evening Seroquel with Ativan for mild agitation.  Final Clinical Impressions(s) / ED Diagnoses   Final diagnoses:  Altered mental status, unspecified altered mental status type  Lower urinary tract infectious disease  Type 2 diabetes mellitus with hyperglycemia, with long-term current use of insulin [facility])    ED Discharge Orders    None       Charlesetta Shanks, MD 07/02/18 2211

## 2018-07-03 DIAGNOSIS — N390 Urinary tract infection, site not specified: Secondary | ICD-10-CM

## 2018-07-03 DIAGNOSIS — Z794 Long term (current) use of insulin: Secondary | ICD-10-CM | POA: Diagnosis not present

## 2018-07-03 DIAGNOSIS — F419 Anxiety disorder, unspecified: Secondary | ICD-10-CM | POA: Diagnosis present

## 2018-07-03 DIAGNOSIS — M797 Fibromyalgia: Secondary | ICD-10-CM | POA: Diagnosis present

## 2018-07-03 DIAGNOSIS — R339 Retention of urine, unspecified: Secondary | ICD-10-CM | POA: Diagnosis present

## 2018-07-03 DIAGNOSIS — Z743 Need for continuous supervision: Secondary | ICD-10-CM | POA: Diagnosis not present

## 2018-07-03 DIAGNOSIS — G9340 Encephalopathy, unspecified: Secondary | ICD-10-CM

## 2018-07-03 DIAGNOSIS — L89306 Pressure-induced deep tissue damage of unspecified buttock: Secondary | ICD-10-CM | POA: Diagnosis present

## 2018-07-03 DIAGNOSIS — N400 Enlarged prostate without lower urinary tract symptoms: Secondary | ICD-10-CM | POA: Diagnosis present

## 2018-07-03 DIAGNOSIS — K219 Gastro-esophageal reflux disease without esophagitis: Secondary | ICD-10-CM | POA: Diagnosis present

## 2018-07-03 DIAGNOSIS — R279 Unspecified lack of coordination: Secondary | ICD-10-CM | POA: Diagnosis not present

## 2018-07-03 DIAGNOSIS — F0391 Unspecified dementia with behavioral disturbance: Secondary | ICD-10-CM

## 2018-07-03 DIAGNOSIS — E785 Hyperlipidemia, unspecified: Secondary | ICD-10-CM | POA: Diagnosis present

## 2018-07-03 DIAGNOSIS — Z91030 Bee allergy status: Secondary | ICD-10-CM | POA: Diagnosis not present

## 2018-07-03 DIAGNOSIS — Y846 Urinary catheterization as the cause of abnormal reaction of the patient, or of later complication, without mention of misadventure at the time of the procedure: Secondary | ICD-10-CM | POA: Diagnosis present

## 2018-07-03 DIAGNOSIS — C9110 Chronic lymphocytic leukemia of B-cell type not having achieved remission: Secondary | ICD-10-CM | POA: Diagnosis present

## 2018-07-03 DIAGNOSIS — Z833 Family history of diabetes mellitus: Secondary | ICD-10-CM | POA: Diagnosis not present

## 2018-07-03 DIAGNOSIS — Z981 Arthrodesis status: Secondary | ICD-10-CM | POA: Diagnosis not present

## 2018-07-03 DIAGNOSIS — Z791 Long term (current) use of non-steroidal anti-inflammatories (NSAID): Secondary | ICD-10-CM | POA: Diagnosis not present

## 2018-07-03 DIAGNOSIS — Z91013 Allergy to seafood: Secondary | ICD-10-CM | POA: Diagnosis not present

## 2018-07-03 DIAGNOSIS — Z7982 Long term (current) use of aspirin: Secondary | ICD-10-CM | POA: Diagnosis not present

## 2018-07-03 DIAGNOSIS — F329 Major depressive disorder, single episode, unspecified: Secondary | ICD-10-CM

## 2018-07-03 DIAGNOSIS — B9562 Methicillin resistant Staphylococcus aureus infection as the cause of diseases classified elsewhere: Secondary | ICD-10-CM | POA: Diagnosis present

## 2018-07-03 DIAGNOSIS — T83518A Infection and inflammatory reaction due to other urinary catheter, initial encounter: Secondary | ICD-10-CM | POA: Diagnosis present

## 2018-07-03 DIAGNOSIS — Z79899 Other long term (current) drug therapy: Secondary | ICD-10-CM | POA: Diagnosis not present

## 2018-07-03 DIAGNOSIS — Z79891 Long term (current) use of opiate analgesic: Secondary | ICD-10-CM | POA: Diagnosis not present

## 2018-07-03 DIAGNOSIS — F418 Other specified anxiety disorders: Secondary | ICD-10-CM | POA: Diagnosis present

## 2018-07-03 DIAGNOSIS — E1165 Type 2 diabetes mellitus with hyperglycemia: Secondary | ICD-10-CM | POA: Diagnosis present

## 2018-07-03 LAB — GLUCOSE, CAPILLARY
Glucose-Capillary: 63 mg/dL — ABNORMAL LOW (ref 70–99)
Glucose-Capillary: 83 mg/dL (ref 70–99)
Glucose-Capillary: 251 mg/dL — ABNORMAL HIGH (ref 70–99)
Glucose-Capillary: 209 mg/dL — ABNORMAL HIGH (ref 70–99)

## 2018-07-03 LAB — VITAMIN B12: Vitamin B-12: 993 pg/mL — ABNORMAL HIGH (ref 180–914)

## 2018-07-03 LAB — AMMONIA: Ammonia: 41 umol/L — ABNORMAL HIGH (ref 9–35)

## 2018-07-03 LAB — TSH: TSH: 1.977 u[IU]/mL (ref 0.350–4.500)

## 2018-07-03 MED ORDER — TAMSULOSIN HCL 0.4 MG PO CAPS
0.4000 mg | ORAL_CAPSULE | Freq: Every evening | ORAL | Status: DC
  Administered 2018-07-03 – 2018-07-05 (×3): 0.4 mg via ORAL
  Filled 2018-07-03 (×3): qty 1

## 2018-07-03 MED ORDER — ASPIRIN EC 81 MG PO TBEC
81.0000 mg | DELAYED_RELEASE_TABLET | Freq: Every day | ORAL | Status: DC
  Administered 2018-07-03 – 2018-07-05 (×3): 81 mg via ORAL
  Filled 2018-07-03 (×3): qty 1

## 2018-07-03 MED ORDER — CARBAMAZEPINE 200 MG PO TABS
100.0000 mg | ORAL_TABLET | Freq: Two times a day (BID) | ORAL | Status: DC
  Administered 2018-07-03 – 2018-07-05 (×6): 100 mg via ORAL
  Filled 2018-07-03 (×6): qty 0.5

## 2018-07-03 MED ORDER — SODIUM CHLORIDE 0.9 % IV SOLN
1.0000 g | Freq: Three times a day (TID) | INTRAVENOUS | Status: DC
  Administered 2018-07-03 – 2018-07-05 (×7): 1 g via INTRAVENOUS
  Filled 2018-07-03 (×9): qty 1

## 2018-07-03 MED ORDER — LORAZEPAM 0.5 MG PO TABS
0.5000 mg | ORAL_TABLET | Freq: Two times a day (BID) | ORAL | Status: DC
  Administered 2018-07-03 – 2018-07-05 (×6): 0.5 mg via ORAL
  Filled 2018-07-03 (×6): qty 1

## 2018-07-03 MED ORDER — SODIUM CHLORIDE 0.9 % IV SOLN
INTRAVENOUS | Status: AC
  Administered 2018-07-03: 03:00:00 via INTRAVENOUS

## 2018-07-03 MED ORDER — DONEPEZIL HCL 10 MG PO TABS
10.0000 mg | ORAL_TABLET | Freq: Every day | ORAL | Status: DC
  Administered 2018-07-03 – 2018-07-05 (×3): 10 mg via ORAL
  Filled 2018-07-03 (×3): qty 1

## 2018-07-03 MED ORDER — ONDANSETRON HCL 4 MG PO TABS
4.0000 mg | ORAL_TABLET | Freq: Four times a day (QID) | ORAL | Status: DC | PRN
  Filled 2018-07-03: qty 1

## 2018-07-03 MED ORDER — ONDANSETRON HCL 4 MG/2ML IJ SOLN: 4.0000 mg | Freq: Four times a day (QID) | INTRAMUSCULAR | Status: DC | PRN

## 2018-07-03 MED ORDER — RISPERIDONE 1 MG PO TABS
1.0000 mg | ORAL_TABLET | Freq: Every day | ORAL | Status: DC
  Administered 2018-07-03 – 2018-07-05 (×3): 1 mg via ORAL
  Filled 2018-07-03 (×3): qty 1

## 2018-07-03 MED ORDER — ENOXAPARIN SODIUM 40 MG/0.4ML ~~LOC~~ SOLN
40.0000 mg | SUBCUTANEOUS | Status: DC
  Administered 2018-07-03 – 2018-07-05 (×3): 40 mg via SUBCUTANEOUS
  Filled 2018-07-03 (×3): qty 0.4

## 2018-07-03 MED ORDER — ACETAMINOPHEN 325 MG PO TABS: 650.0000 mg | ORAL_TABLET | Freq: Four times a day (QID) | ORAL | Status: DC | PRN

## 2018-07-03 MED ORDER — HYDROCODONE-ACETAMINOPHEN 5-325 MG PO TABS: 1.0000 | ORAL_TABLET | Freq: Four times a day (QID) | ORAL | Status: DC | PRN

## 2018-07-03 MED ORDER — RISPERIDONE 0.25 MG PO TABS
0.5000 mg | ORAL_TABLET | Freq: Every day | ORAL | Status: DC
  Administered 2018-07-03 – 2018-07-05 (×3): 0.5 mg via ORAL
  Filled 2018-07-03 (×3): qty 2

## 2018-07-03 MED ORDER — DOCUSATE SODIUM 100 MG PO CAPS
100.0000 mg | ORAL_CAPSULE | Freq: Every day | ORAL | Status: DC
  Administered 2018-07-03 – 2018-07-05 (×3): 100 mg via ORAL
  Filled 2018-07-03 (×3): qty 1

## 2018-07-03 MED ORDER — PRAVASTATIN SODIUM 20 MG PO TABS
20.0000 mg | ORAL_TABLET | Freq: Every day | ORAL | Status: DC
  Administered 2018-07-03 – 2018-07-05 (×3): 20 mg via ORAL
  Filled 2018-07-03 (×3): qty 1

## 2018-07-03 MED ORDER — ACETAMINOPHEN 650 MG RE SUPP: 650.0000 mg | Freq: Four times a day (QID) | RECTAL | Status: DC | PRN

## 2018-07-03 MED ORDER — TRAZODONE HCL 50 MG PO TABS
150.0000 mg | ORAL_TABLET | Freq: Every day | ORAL | Status: DC
  Administered 2018-07-04 – 2018-07-05 (×2): 150 mg via ORAL
  Filled 2018-07-03 (×3): qty 1

## 2018-07-03 NOTE — Plan of Care (Signed)

## 2018-07-03 NOTE — ED Notes (Signed)
Unable to call report due to nurse being on break. Waiting for nurse to call back.

## 2018-07-03 NOTE — Progress Notes (Signed)
Pharmacy Antibiotic Note  Hayden [name] is a 78 y.o. male admitted on 07/02/2018 with UTI, from SNF, recent abx use and hx of pseudomonas in urine last March.  Pharmacy has been consulted for Cefepime dosing.  Plan: Cefepime 2gm iv x1, then 1gm iv q8hr  Weight: 140 lb (63.5 kg)  Temp (24hrs), Avg:97.8 F (36.6 C), Min:97.8 F (36.6 C), Max:97.8 F (36.6 C)  Recent Labs  Lab 07/02/18 1812 07/02/18 1906 07/02/18 2209  WBC 12.2*  --   --   CREATININE 0.87  --   --   LATICACIDVEN  --  1.70 1.62    Estimated Creatinine Clearance: 62.9 mL/min (by C-G formula based on SCr of 0.87 mg/dL).    Allergies  Allergen Reactions  . Bee Venom Other (See Comments)    Bump where stung and took days to go away  . Glucophage [Metformin] Diarrhea    "i will never take it again -stool incontinent "  . Shellfish Allergy Swelling  . Grass Extracts [Gramineae Pollens]   . Tobacco [[place] Tabacum]     Antimicrobials this admission: Cefepime 07/02/2018 >>   Dose adjustments this admission: -  Microbiology results: -  Thank you for allowing pharmacy to be a part of this patient's care.  Nani Skillern Crowford 07/03/2018 6:05 AM

## 2018-07-03 NOTE — ED Notes (Signed)
Attempted to call report, no answer. Will try again.

## 2018-07-03 NOTE — Plan of Care (Signed)
78 year old male admitted from skilled nursing facility with history of dementia and behavioral disturbance type 2 diabetes recent balanitis with urinary retention treated with Foley catheter and antifungals now admitted with increased confusion thought to be secondary to UTI follow-up urine culture patient currently has condom catheter.

## 2018-07-03 NOTE — H&P (Signed)
History and Physical    Hayden [name] XBD:[id number] DOB: 1940-09-14 DOA: 07/02/2018  PCP: Patient, No Pcp Per   Patient coming from: SNF   Chief Complaint: Increased confusion   HPI: Hayden [name] is a 78 y.o. male with medical history significant for dementia with behavioral disturbance, depression, anxiety, insulin-dependent diabetes mellitus, and recent balanitis with urinary retention treated with Foley catheter and antifungal's, now presenting to the emergency department for evaluation of increased confusion.  Patient has had a Foley catheter in place for 2 to 3 weeks now, had been in his usual state until he was noted today to be undressing at the nursing home and urinating on the floor, apparently disoriented beyond his baseline dementia.  Patient had not fallen, had not been complaining of anything in particular, has not had any vomiting or diarrhea, and has not appeared dyspneic or coughing.  ED Course: Upon arrival to the ED, patient is found to be afebrile, saturating well on room air, and with vitals otherwise normal.  Chemistry panel is notable for an elevated BUN to creatinine ratio and serum glucose of 440 without DKA.  CBC features a leukocytosis to 12,200.  Lactic acid is reassuringly normal.  Urinalysis suggest possible infection.  Urine was sent for culture, 10 units of subcutaneous NovoLog was given, and the patient was treated with Rocephin, Seroquel, and Ativan in the ED.  He remains hemodynamically stable, in no apparent respiratory distress, and will be observed for further evaluation and management of acute encephalopathy, suspected secondary to UTI.  Review of Systems:  Unable to complete ROS secondary to the patient's clinical condition.  Past Medical History:  Diagnosis Date  . Candidiasis   . Depression   . Diabetes mellitus   . Enlarged prostate   . Fibromyalgia   . GERD (gastroesophageal reflux disease)    reflux intermittent  . Hyperlipidemia   .  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 Leukemia ([place]) chronic lymphocytic   2008 diagnosed-monitored Dr     Past Surgical History:  Procedure Laterality Date  . ADENOIDECTOMY    . CARPAL TUNNEL RELEASE     rt hand  . CATARACT EXTRACTION, BILATERAL    . CERVICAL FUSION  1998  . CYSTOSCOPY  07/26/2012   Procedure: CYSTOSCOPY;  Surgeon: Ailene Rud, MD;  Location: [facility];  Service: Urology;  Laterality: N/A;  DIAGNOSTIC CYSTO PROSTATE ULTRASOUND      . PROSTATE BIOPSY  07/26/2012   Procedure: PROSTATE BIOPSY;  Surgeon: Ailene Rud, MD;  Location[facility];  Service: Urology;  Laterality: N/A;  . retinal micro aneurysms    . tonsil    . VASECTOMY    . VITRECTOMY       reports that he has never smoked. He has never used smokeless tobacco. He reports that he does not drink alcohol or use drugs.  Allergies  Allergen Reactions  . Bee Venom Other (See Comments)    Bump where stung and took days to go away  . Glucophage [Metformin] Diarrhea    "i will never take it again -stool incontinent "  . Shellfish Allergy Swelling  . Grass Extracts [Gramineae Pollens]   . Tobacco [[place] Tabacum]     Family History  Problem Relation Age of Onset  . Diabetes Father   . Diabetes Sister   . Diabetes Brother      Prior to Admission medications   Medication  Sig Start Date End Date Taking? Authorizing Provider  acetaminophen (TYLENOL) 500 MG tablet Take 500 mg by mouth every 4 (four) hours as needed for fever or headache.   Yes [provider]  alum & mag hydroxide-simeth (MINTOX REGULAR STRENGTH) 725-366-44 MG/5ML suspension Take 30 mLs by mouth every 6 (six) hours as needed for indigestion or heartburn.   Yes [provider]  aspirin 81 MG tablet Take 81 mg by mouth every morning.    Yes [provider]  carbamazepine (TEGRETOL) 200 MG tablet Take 100 mg by  mouth 2 (two) times daily.   Yes [provider]  cetirizine (ZYRTEC) 10 MG tablet Take 10 mg by mouth daily as needed for allergies.    Yes [provider]  Cholecalciferol (VITAMIN D3) 1000 UNITS CAPS Take 1,000 Units by mouth daily after breakfast.    Yes [provider]  DIMETHICONE-ZINC OXIDE EX Apply 1 application topically See admin instructions. Apply topically to buttocks and scrotum after each incontinence episode   Yes [provider]  docusate sodium (COLACE) 100 MG capsule Take 100 mg by mouth daily.    Yes [provider]  donepezil (ARICEPT) 10 MG tablet Take 1 tablet (10 mg total) by mouth at bedtime. 05/03/18  Yes Cameron Sprang, MD  fish oil-omega-3 fatty acids 1000 MG capsule Take 1 g by mouth 2 (two) times daily.    Yes [provider]  guaiFENesin (ROBITUSSIN) 100 MG/5ML SOLN Take 10 mLs by mouth every 6 (six) hours as needed for cough or to loosen phlegm.   Yes [provider]  HYDROcodone-acetaminophen (NORCO/VICODIN) 5-325 MG tablet Take 1 tablet by mouth every 6 (six) hours as needed for moderate pain.   Yes [provider]  LEVEMIR FLEXTOUCH 100 UNIT/ML Pen Inject 36 Units into the skin at bedtime.  07/16/15  Yes [provider]  loperamide (IMODIUM) 2 MG capsule Take 2 mg by mouth as needed for diarrhea or loose stools.   Yes [provider]  LORazepam (ATIVAN) 0.5 MG tablet Take 0.5 mg by mouth 2 (two) times daily.   Yes [provider]  lovastatin (MEVACOR) 20 MG tablet Take 20 mg by mouth daily.   Yes [provider]  magnesium hydroxide (MILK OF MAGNESIA) 400 MG/5ML suspension Take 30 mLs by mouth daily as needed for mild constipation.   Yes [provider]  neomycin-bacitracin-polymyxin (NEOSPORIN) OINT Apply 1 application topically as needed for wound care (left shoulder).   Yes [provider]  risperiDONE (RISPERDAL) 0.5 MG tablet Take 0.5-1 mg  by mouth See admin instructions. 0.5 mg by mouth every morning and take 1 mg by mouth at bedtime.   Yes [provider]  tamsulosin (FLOMAX) 0.4 MG CAPS capsule Take 0.4 mg by mouth every evening.    Yes [provider]  tolnaftate (TINACTIN) 1 % cream Apply 1 application topically 2 (two) times daily. 05/27/18  Yes Isla Pence, MD  traZODone (DESYREL) 100 MG tablet Take 150 mg by mouth at bedtime.   Yes [provider]  vitamin B-12 (CYANOCOBALAMIN) 1000 MCG tablet Take 1,000 mcg by mouth every morning.    Yes [provider]  cephALEXin (KEFLEX) 500 MG capsule Take 500 mg by mouth 3 (three) times daily.    [provider]  EPINEPHrine (EPIPEN JR) 0.15 MG/0.3ML injection Inject 0.15 mg into the muscle as needed for anaphylaxis.    [provider]  nystatin cream (MYCOSTATIN) Apply to affected  area 2 times daily Patient taking differently: Apply 1 application topically 2 (two) times daily.  04/23/18   [name], PA-C  calcium citrate-vitamin D (CITRACAL+D) 315-200 MG-UNIT per tablet Take 1 tablet by mouth daily.  09/04/11  [provider]  terazosin (HYTRIN) 5 MG capsule Take 5 mg by mouth at bedtime.  09/04/11  [provider]    Physical Exam: Vitals:   07/03/18 0000 07/03/18 0030 07/03/18 0100 07/03/18 0130  BP: 138/63 124/71 (!) 135/58 113/67  Pulse: 76 71 78 71  Resp: 16 15 17 12   Temp:      TempSrc:      SpO2: 97% 98% 99% 98%    Constitutional: NAD, calm, appears frail   Eyes: PERTLA, lids and conjunctivae normal ENMT: Mucous membranes are moist. Posterior pharynx clear of any exudate or lesions.   Neck: normal, supple, no masses, no thyromegaly Respiratory: clear to auscultation bilaterally, no wheezing, no crackles. Normal respiratory effort.  Cardiovascular: S1 & S2 heard, regular rate and rhythm. No extremity edema.   Abdomen: No distension, no tenderness, soft. Bowel sounds active.  Musculoskeletal: no  clubbing / cyanosis. No joint deformity upper and lower extremities.    Skin: no significant rashes, lesions, ulcers. Warm, dry, well-perfused. Neurologic: no facial asymmetry. Sensation intact. Moving all extremities.  Psychiatric: Somnolent, easily woken. Disoriented.     Labs on Admission: I have personally reviewed following labs and imaging studies  CBC: Recent Labs  Lab 07/02/18 1812  WBC 12.2*  HGB 14.0  HCT 43.3  MCV 94.3  PLT 124   Basic Metabolic Panel: Recent Labs  Lab 07/02/18 1812  NA 137  K 4.9  CL 99  CO2 28  GLUCOSE 440*  BUN 33*  CREATININE 0.87  CALCIUM 9.5   GFR: CrCl cannot be calculated (Unknown ideal weight.). Liver Function Tests: Recent Labs  Lab 07/02/18 1812  AST 28  ALT 33  ALKPHOS 63  BILITOT 0.7  PROT 6.9  ALBUMIN 4.2   No results for input(s): LIPASE, AMYLASE in the last 168 hours. No results for input(s): AMMONIA in the last 168 hours. Coagulation Profile: No results for input(s): INR, PROTIME in the last 168 hours. Cardiac Enzymes: No results for input(s): CKTOTAL, CKMB, CKMBINDEX, TROPONINI in the last 168 hours. BNP (last 3 results) No results for input(s): PROBNP in the last 8760 hours. HbA1C: No results for input(s): HGBA1C in the last 72 hours. CBG: Recent Labs  Lab 07/02/18 1816  GLUCAP 599*   Lipid Profile: No results for input(s): CHOL, HDL, LDLCALC, TRIG, CHOLHDL, LDLDIRECT in the last 72 hours. Thyroid Function Tests: No results for input(s): TSH, T4TOTAL, FREET4, T3FREE, THYROIDAB in the last 72 hours. Anemia Panel: No results for input(s): VITAMINB12, FOLATE, FERRITIN, TIBC, IRON, RETICCTPCT in the last 72 hours. Urine analysis:    Component Value Date/Time   COLORURINE STRAW (A) 07/02/2018 1839   APPEARANCEUR CLOUDY (A) 07/02/2018 1839   LABSPEC 1.022 07/02/2018 1839   PHURINE 7.0 07/02/2018 1839   GLUCOSEU >=500 (A) 07/02/2018 1839   HGBUR SMALL (A) 07/02/2018 1839   BILIRUBINUR NEGATIVE  07/02/2018 1839   [place] 07/02/2018 1839   PROTEINUR NEGATIVE 07/02/2018 1839   NITRITE NEGATIVE 07/02/2018 1839   LEUKOCYTESUR LARGE (A) 07/02/2018 1839   Sepsis Labs: @LABRCNTIP (procalcitonin:4,lacticidven:4) )No results found for this or any previous visit (from the past 240 hour(s)).   Radiological Exams on Admission: No results found.  EKG: Not performed.   Assessment/Plan   1. Acute encephalopathy  -  Presents from SNF with increased confusion and bizarre behavior  - No focal neurologic deficits identified  - Likely secondary to UTI, treating as discussed below  - Check TSH, ammonia, B12, folate, and RPR  - Continue supportive care    2. Catheter-associated UTI  - Presents with increased confusion, disorientation  - Found to have leukocytosis and UA suggestive of infection  - Hx of pseudomonas in urine last March  - Urine sent for culture and empiric Rocephin given in ED  - Continue empiric antibiotics with cefepime given hx of pseudomonas, follow culture and clinical course    3. Dementia with behavioral disturbance; depression with anxiety  - Presents with increased confusion and disorientation, suspected secondary to UTI  - Continue Aricept, Risperdal, trazodone, and Ativan    4. Uncontrolled insulin dependent DM  - Serum glucose is 440 in ED without DKA, possibly exacerbated from acute infection  - A1c was 8.5% in October  - Managed at [facility] with Levemir 36 units qHS  - Given 10 units sq Novolog in ED  - Continue CBG's, continue Levemir, add SSI with Novolog while in hospital     DVT prophylaxis: Lovenox  Code Status: Full  Family Communication: Discussed with patient  Consults called: None Admission status: Observation     Vianne Bulls, MD [facility] Pager [phone]  If 7PM-7AM, please contact night-coverage [email or web address] Password [email or web address]  07/03/2018, 1:53 AM

## 2018-07-03 NOTE — Progress Notes (Signed)
PHARMACY NOTE -  Cefepime  Pharmacy has been assisting with dosing of Cefepime for UTI.  Dosage remains stable at 1g IV q8 hr and need for further dosage adjustment appears unlikely at present given stable renal function  Pharmacy will sign off, following peripherally for culture results or dose adjustments. Please reconsult if a change in clinical status warrants re-evaluation of dosage.  Reuel Boom, PharmD, BCPS [phone] 07/03/2018, 3:24 PM

## 2018-07-04 LAB — GLUCOSE, CAPILLARY
Glucose-Capillary: 45 mg/dL — ABNORMAL LOW (ref 70–99)
Glucose-Capillary: 115 mg/dL — ABNORMAL HIGH (ref 70–99)
Glucose-Capillary: 128 mg/dL — ABNORMAL HIGH (ref 70–99)
Glucose-Capillary: 250 mg/dL — ABNORMAL HIGH (ref 70–99)
Glucose-Capillary: 241 mg/dL — ABNORMAL HIGH (ref 70–99)

## 2018-07-04 LAB — VALPROIC ACID LEVEL: Valproic Acid Lvl: <10 ug/mL — ABNORMAL LOW (ref 50.0–100.0)

## 2018-07-04 LAB — FOLATE RBC

## 2018-07-04 LAB — HIV ANTIBODY (ROUTINE TESTING W REFLEX): HIV Screen 4th Generation wRfx: NONREACTIVE

## 2018-07-04 LAB — RPR: RPR Ser Ql: NONREACTIVE

## 2018-07-04 NOTE — Progress Notes (Signed)
Patients current blood sugar 45, orange juice given to patient by Nurse Awilda Bill. Will recheck blood sugar in 15 minutes.

## 2018-07-04 NOTE — Progress Notes (Signed)
Recheck of blood sugar 115. Patient eating breakfast.

## 2018-07-04 NOTE — Progress Notes (Signed)
PROGRESS NOTE    Hayden [name]  HGD:[id number] DOB: 28-Jun-1941 DOA: 07/02/2018 PCP: Patient, No Pcp Per    Brief Sant[age] Evans y.o. male with medical history significant for dementia with behavioral disturbance, depression, anxiety, insulin-dependent diabetes mellitus, and recent balanitis with urinary retention treated with Foley catheter and antifungal's, now presenting to the emergency department for evaluation of increased confusion.  Patient has had a Foley catheter in place for 2 to 3 weeks now, had been in his usual state until he was noted today to be undressing at the nursing home and urinating on the floor, apparently disoriented beyond his baseline dementia.  Patient had not fallen, had not been complaining of anything in particular, has not had any vomiting or diarrhea, and has not appeared dyspneic or coughing.  ED Course: Upon arrival to the ED, patient is found to be afebrile, saturating well on room air, and with vitals otherwise normal.  Chemistry panel is notable for an elevated BUN to creatinine ratio and serum glucose of 440 without DKA.  CBC features a leukocytosis to 12,200.  Lactic acid is reassuringly normal.  Urinalysis suggest possible infection.  Urine was sent for culture, 10 units of subcutaneous NovoLog was given, and the patient was treated with Rocephin, Seroquel, and Ativan in the ED.  He remains hemodynamically stable, in no apparent respiratory distress, and will be observed for further evaluation and management of acute encephalopathy, suspected secondary to UTI.  Assessment & Plan:   Principal Problem:   Acute encephalopathy Active Problems:   Type 2 diabetes mellitus with complication (HCC)   Lower urinary tract infectious disease   Depression   Dementia with behavioral disturbance ([place])  1. Acute encephalopathy  - Presents from SNF with increased confusion and bizarre behavior  - No focal neurologic deficits identified  -Urine culture with more than  100,000 colonies of staph aureus and Morganella morganii acne sensitivity pending.  2. Catheter-associated UTI  - Presents with increased confusion, disorientation  - Found to have leukocytosis and UA suggestive of infection  - Hx of pseudomonas in urine last March  - Urine sent for culture and empiric Rocephin given in ED  - Continue empiric antibiotics with cefepime given hx of pseudomonas, follow culture and clinical course    3. Dementia with behavioral disturbance; depression with anxiety  - Presents with increased confusion and disorientation, suspected secondary to UTI  - Continue Aricept, Risperdal, trazodone, and Ativan    4. Uncontrolled insulin dependent DM  - Serum glucose is 440 in ED without DKA, possibly exacerbated from acute infection  - A1c was 8.5% in October  - Managed at [facility] with Levemir 36 units qHS  - Given 10 units sq Novolog in ED  - Continue CBG's, continue Levemir, add SSI with Novolog while in hospital     Pressure Injury 05/22/18 Stage I -  Intact skin with non-blanchable redness of a localized area usually over a bony prominence. (Active)  05/22/18 2100  Location: Buttocks  Location Orientation: Medial  Staging: Stage I -  Intact skin with non-blanchable redness of a localized area usually over a bony prominence.  Wound Description (Comments):   Present on Admission: Yes     Estimated body mass index is 21.29 kg/m as calculated from the following:   Height as of 06/11/18: 5\' 8"  (1.727 m).   Weight as of this encounter: 63.5 kg.  DVT prophylaxis: Lovenox Code Status: Full code Family Communication none Disposition Plan: Discharge back to nursing home in 24  hours await PT evaluation await sensitivity Consultants:  None  Procedures: None Antimicrobials: Cefepime  Subjective: Resting in bed patient pulled out the condom cath last night and was able to urinate without any catheter.  Objective: Vitals:   07/03/18 0600 07/03/18 1420  07/03/18 2009 07/04/18 0417  BP:  (!) 115/52 123/64 131/69  Pulse:  79 76 72  Resp:  14 18 16   Temp:  98.7 F (37.1 C) 98.2 F (36.8 C) 98 F (36.7 C)  TempSrc:  Oral Oral Oral  SpO2:  97% 98% 96%  Weight: 63.5 kg       Intake/Output Summary (Last 24 hours) at 07/04/2018 1231 Last data filed at 07/04/2018 1159 Gross per 24 hour  Intake 580 ml  Output 1120 ml  Net -540 ml   Filed Weights   07/03/18 0600  Weight: 63.5 kg    Examination:  General exam: Appears calm and comfortable  Respiratory system: Clear to auscultation. Respiratory effort normal. Cardiovascular system: S1 & S2 heard, RRR. No JVD, murmurs, rubs, gallops or clicks. No pedal edema. Gastrointestinal system: Abdomen is nondistended, soft and nontender. No organomegaly or masses felt. Normal bowel sounds heard. Central nervous system: Alert and oriented. No focal neurological deficits. Extremities: Symmetric 5 x 5 power. Skin: No rashes, lesions or ulcers      Data Reviewed: I have personally reviewed following labs and imaging studies  CBC: Recent Labs  Lab 07/02/18 1812 07/03/18 0601  WBC 12.2*  --   HGB 14.0  --   HCT 43.3 NOT PERFORMED  MCV 94.3  --   PLT 239  --    Basic Metabolic Panel: Recent Labs  Lab 07/02/18 1812  NA 137  K 4.9  CL 99  CO2 28  GLUCOSE 440*  BUN 33*  CREATININE 0.87  CALCIUM 9.5   GFR: Estimated Creatinine Clearance: 62.9 mL/min (by C-G formula based on SCr of 0.87 mg/dL). Liver Function Tests: Recent Labs  Lab 07/02/18 1812  AST 28  ALT 33  ALKPHOS 63  BILITOT 0.7  PROT 6.9  ALBUMIN 4.2   No results for input(s): LIPASE, AMYLASE in the last 168 hours. Recent Labs  Lab 07/03/18 0601  AMMONIA 41*   Coagulation Profile: No results for input(s): INR, PROTIME in the last 168 hours. Cardiac Enzymes: No results for input(s): CKTOTAL, CKMB, CKMBINDEX, TROPONINI in the last 168 hours. BNP (last 3 results) No results for input(s): PROBNP in the last 8760  hours. HbA1C: No results for input(s): HGBA1C in the last 72 hours. CBG: Recent Labs  Lab 07/03/18 2012 07/03/18 2154 07/04/18 0807 07/04/18 0902 07/04/18 1219  GLUCAP 251* 209* 45* 115* 128*   Lipid Profile: No results for input(s): CHOL, HDL, LDLCALC, TRIG, CHOLHDL, LDLDIRECT in the last 72 hours. Thyroid Function Tests: Recent Labs    07/03/18 0601  TSH 1.977   Anemia Panel: Recent Labs    07/03/18 0837  VITAMINB12 993*   Sepsis Labs: Recent Labs  Lab 07/02/18 1906 07/02/18 2209  LATICACIDVEN 1.70 1.62    Recent Results (from the past 240 hour(s))  Urine culture     Status: Abnormal (Preliminary result)   Collection Time: 07/02/18  9:33 PM  Result Value Ref Range Status   Specimen Description   Final    URINE, CLEAN CATCH Performed at [facility], [place] [address], [place],  [address]    Special Requests   Final    NONE Performed at [facility], [place]  [address], [place], [place] [address]    Culture (A)  Final    >=100,000 COLONIES/mL MORGANELLA MORGANII >=100,000 COLONIES/mL STAPHYLOCOCCUS AUREUS    Report Status PENDING  Incomplete         Radiology Studies: No results found.      Scheduled Meds: . aspirin EC  81 mg Oral Daily  . carbamazepine  100 mg Oral BID  . docusate sodium  100 mg Oral Daily  . donepezil  10 mg Oral QHS  . enoxaparin (LOVENOX) injection  40 mg Subcutaneous Q24H  . insulin aspart  0-5 Units Subcutaneous QHS  . insulin aspart  0-9 Units Subcutaneous TID WC  . insulin detemir  25 Units Subcutaneous QHS  . LORazepam  0.5 mg Oral BID  . pravastatin  20 mg Oral q1800  . risperiDONE  0.5 mg Oral Daily  . risperiDONE  1 mg Oral QHS  . tamsulosin  0.4 mg Oral QPM  . traZODone  150 mg Oral QHS   Continuous Infusions: . ceFEPime (MAXIPIME) IV 200 mL/hr at 07/04/18 0644     LOS: 1 day     Georgette Shell, MD [facility]  If 7PM-7AM, please contact  night-coverage [email or web address] Password [facility] 07/04/2018, 12:31 PM

## 2018-07-04 NOTE — Evaluation (Signed)
Physical Therapy Evaluation Patient Details Name: Hayden [name] MRN: [id number] DOB: 04/25/1941 Today's Date: 07/04/2018   History of Present Illness  78 y.o. male admitted with acute encephalopathy after having increased confusion and bizarre behavior at his ALF.  PMH includes depression, dementia, diabtes, hyperlipidemia, BPH, and CLL.  Clinical Impression  Pt admitted with above diagnosis. Pt currently with functional limitations due to the deficits listed below (see PT Problem List). Pt will benefit from skilled PT to increase their independence and safety with mobility to allow discharge to the venue listed below.  Recommend returning to ALF with HHPT.  If he continues to progress in acute care, he may not need HH.  He may be close to his baseline mobility.     Follow Up Recommendations Supervision/Assistance - 24 hour;Other (comment);Home health PT(ALF)    Equipment Recommendations  None recommended by PT    Recommendations for Other Services       Precautions / Restrictions Precautions Precautions: Fall Restrictions Weight Bearing Restrictions: No      Mobility  Bed Mobility Overal bed mobility: Independent                Transfers Overall transfer level: Needs assistance Equipment used: Rolling walker (2 wheeled) Transfers: Sit to/from Stand Sit to Stand: Supervision         General transfer comment: S for safety  Ambulation/Gait Ambulation/Gait assistance: Min guard Gait Distance (Feet): 170 Feet Assistive device: Rolling walker (2 wheeled);IV Pole Gait Pattern/deviations: Trunk flexed;Decreased step length - right;Decreased step length - left     General Gait Details: Pt ambulated with RW for half of gait and then transitioned to the IV pole where he did show an increase in cadence and flexed posture.  Stairs            Wheelchair Mobility    Modified Rankin (Stroke Patients Only)       Balance Overall balance assessment: Needs  assistance   Sitting balance-Leahy Scale: Good       Standing balance-Leahy Scale: Fair                               Pertinent Vitals/Pain Pain Assessment: No/denies pain    Home Living Family/patient expects to be discharged to:: Assisted living               Home Equipment: Cane - quad      Prior Function Level of Independence: Independent with assistive device(s)         Comments: Amb with quad cane     Hand Dominance   Dominant Hand: Right    Extremity/Trunk Assessment   Upper Extremity Assessment Upper Extremity Assessment: Overall WFL for tasks assessed    Lower Extremity Assessment Lower Extremity Assessment: Overall WFL for tasks assessed;Generalized weakness       Communication   Communication: No difficulties  Cognition     Overall Cognitive Status: No family/caregiver present to determine baseline cognitive functioning Area of Impairment: Orientation                 Orientation Level: Disoriented to;Place             General Comments: Pt thought he was at his ALF.  Has hx of dementia.  Once oriented to location, he stated he was in the hospital for a UTI.      General Comments      Exercises  Assessment/Plan    PT Assessment Patient needs continued PT services  PT Problem List Decreased strength;Decreased mobility;Decreased balance       PT Treatment Interventions Gait training;Therapeutic activities;Balance training;Therapeutic exercise    PT Goals (Current goals can be found in the Care Plan section)  Acute Rehab PT Goals Patient Stated Goal: none stated PT Goal Formulation: Patient unable to participate in goal setting Time For Goal Achievement: 07/18/18    Frequency Min 3X/week   Barriers to discharge        Co-evaluation               AM-PAC PT "6 Clicks" Mobility  Outcome Measure Help needed turning from your back to your side while in a flat bed without using bedrails?:  None Help needed moving from lying on your back to sitting on the side of a flat bed without using bedrails?: None Help needed moving to and from a bed to a chair (including a wheelchair)?: None Help needed standing up from a chair using your arms (e.g., wheelchair or bedside chair)?: A Little Help needed to walk in hospital room?: A Little Help needed climbing 3-5 steps with a railing? : A Little 6 Click Score: 21    End of Session Equipment Utilized During Treatment: Gait belt Activity Tolerance: Patient tolerated treatment well Patient left: in bed;with bed alarm set;with call bell/phone within reach Nurse Communication: Mobility status PT Visit Diagnosis: Difficulty in walking, not elsewhere classified (R26.2);Muscle weakness (generalized) (M62.81)    Time: 5449-2010 PT Time Calculation (min) (ACUTE ONLY): 14 min   Charges:   PT Evaluation $PT Eval Low Complexity: 1 Low          Fiorella Hanahan L. Tamala Julian, [place] Pager [phone] 07/04/2018   Galen Manila 07/04/2018, 2:52 PM

## 2018-07-05 LAB — GLUCOSE, CAPILLARY
GLUCOSE-CAPILLARY: 214 mg/dL — AB (ref 70–99)
Glucose-Capillary: 77 mg/dL (ref 70–99)
Glucose-Capillary: 178 mg/dL — ABNORMAL HIGH (ref 70–99)
Glucose-Capillary: 180 mg/dL — ABNORMAL HIGH (ref 70–99)

## 2018-07-05 LAB — URINE CULTURE: Culture: >=100000 — AB

## 2018-07-05 LAB — FOLATE RBC
Folate, Hemolysate: 509 ng/mL
Folate, RBC: 1209 ng/mL (ref >498)
Hematocrit: 42.1 % (ref 37.5–51.0)

## 2018-07-05 MED ORDER — SODIUM CHLORIDE 0.9 % IV SOLN
INTRAVENOUS | Status: DC | PRN
  Administered 2018-07-05: 16:00:00 via INTRAVENOUS

## 2018-07-05 MED ORDER — NITROFURANTOIN MONOHYD MACRO 100 MG PO CAPS: 100.0000 mg | ORAL_CAPSULE | Freq: Two times a day (BID) | ORAL | 0 refills | Status: AC

## 2018-07-05 MED ORDER — SULFAMETHOXAZOLE-TRIMETHOPRIM 800-160 MG PO TABS: 1.0000 | ORAL_TABLET | Freq: Two times a day (BID) | ORAL | 0 refills | Status: DC

## 2018-07-05 NOTE — Discharge Summary (Signed)
Physician Discharge Summary  Hayden [name] UTM:[id number] DOB: 1941-01-21 DOA: 79/08/2018  PCP: Patient, No Pcp Per  Admit date: 07/02/2018 Discharge date: 07/05/2018  Admitted From nursing home Disposition:  Nursing home Recommendations for Outpatient Follow-up:  1. Follow up with PCP in 1-2 weeks 2. Please obtain BMP/CBC in one week   Home Health none Equipment/Devicesnone  Discharge Condition:stable CODE STATUS full Diet recommendation:cardiac Brief/Interim Summary:78 y.o.malewith medical history significant fordementia with behavioral disturbance, depression, anxiety, insulin-dependent diabetes mellitus, and recent balanitis with urinary retention treated with Foley catheter and antifungal's, now presenting to the emergency department for evaluation of increased confusion. Patient has had a Foley catheter in place for 2 to 3 weeks now, had been in his usual state until he was noted today to be undressing at the nursing home and urinating on the floor, apparently disoriented beyond his baseline dementia. Patient had not fallen, had not been complaining of anything in particular, has not had any vomiting or diarrhea, and has not appeared dyspneic or coughing.  ED Course:Upon arrival to the ED, patient is found to be afebrile, saturating well on room air, and with vitals otherwise normal. Chemistry panel is notable for an elevated BUN to creatinine ratio and serum glucose of 440 without DKA. CBC features a leukocytosis to 12,200. Lactic acid is reassuringly normal. Urinalysis suggest possible infection. Urine was sent for culture, 10 units of subcutaneous NovoLog was given, and the patient was treated with Rocephin, Seroquel, and Ativan in the ED. He remains hemodynamically stable, in no apparent respiratory distress, and will be observed for further evaluation and management of acute encephalopathy, suspected secondary to UTI.   Discharge Diagnoses:  Principal Problem:   Acute  encephalopathy Active Problems:   Type 2 diabetes mellitus with complication (HCC)   Lower urinary tract infectious disease   Depression   Dementia with behavioral disturbance (HCC)   1.Acute encephalopathy -Presents from SNF with increased confusion and bizarre behavior -No focal neurologic deficits identified -Urine culture with more than 100,000 colonies of MRSA sensitive to bactrim and Morganella morganii sensitive to nitrofurantoin.will discharge on both antibiotics.  2.Catheter-associated UTI -Presents with increased confusion, disorientation -Found to have leukocytosis and UA suggestive of infection -Hx of pseudomonas in urine last March -Urine culture as above  3.Dementia with behavioral disturbance; depression with anxiety -Presents with increased confusion and disorientation, suspected secondary to UTI -Continue Aricept, Risperdal, trazodone, and Ativan  4.Uncontrolled insulin dependent DM -Serum glucose is 440 in ED without DKA, possibly exacerbated from acute infection -A1c was 8.5% in October -Managed at [facility] with Levemir 36 units qHS -Given 10 units sq Novolog in ED      Pressure Injury 05/22/18 Stage I -  Intact skin with non-blanchable redness of a localized area usually over a bony prominence. (Active)  05/22/18 2100  Location: Buttocks  Location Orientation: Medial  Staging: Stage I -  Intact skin with non-blanchable redness of a localized area usually over a bony prominence.  Wound Description (Comments):   Present on Admission: Yes                    Estimated body mass index is 21.29 kg/m as calculated from the following:   Height as of this encounter: 5\' 8"  (1.727 m).   Weight as of this encounter: 63.5 kg.  Discharge Instructions  Discharge Instructions    Call MD for:  difficulty breathing, headache or visual disturbances   Complete by:  As directed    Call MD  for:  persistant nausea and  vomiting   Complete by:  As directed    Call MD for:  temperature >100.4   Complete by:  As directed    Diet - low sodium heart healthy   Complete by:  As directed    Increase activity slowly   Complete by:  As directed      Allergies as of 07/05/2018      Reactions   Bee Venom Other (See Comments)   Bump where stung and took days to go away   Glucophage [metformin] Diarrhea   "i will never take it again -stool incontinent "   Shellfish Allergy Swelling   Grass Extracts [gramineae Pollens]    Tobacco [nicotiana Tabacum]       Medication List    STOP taking these medications   cephALEXin 500 MG capsule Commonly known as:  KEFLEX     TAKE these medications   acetaminophen 500 MG tablet Commonly known as:  TYLENOL Take 500 mg by mouth every 4 (four) hours as needed for fever or headache.   aspirin 81 MG tablet Take 81 mg by mouth every morning.   carbamazepine 200 MG tablet Commonly known as:  TEGRETOL Take 100 mg by mouth 2 (two) times daily.   cetirizine 10 MG tablet Commonly known as:  ZYRTEC Take 10 mg by mouth daily as needed for allergies.   DIMETHICONE-ZINC OXIDE EX Apply 1 application topically See admin instructions. Apply topically to buttocks and scrotum after each incontinence episode   docusate sodium 100 MG capsule Commonly known as:  COLACE Take 100 mg by mouth daily.   donepezil 10 MG tablet Commonly known as:  ARICEPT Take 1 tablet (10 mg total) by mouth at bedtime.   EPINEPHrine 0.15 MG/0.3ML injection Commonly known as:  EPIPEN JR Inject 0.15 mg into the muscle as needed for anaphylaxis.   fish oil-omega-3 fatty acids 1000 MG capsule Take 1 g by mouth 2 (two) times daily.   guaiFENesin 100 MG/5ML Soln Commonly known as:  ROBITUSSIN Take 10 mLs by mouth every 6 (six) hours as needed for cough or to loosen phlegm.   HYDROcodone-acetaminophen 5-325 MG tablet Commonly known as:  NORCO/VICODIN Take 1 tablet by mouth every 6 (six) hours as  needed for moderate pain.   LEVEMIR FLEXTOUCH 100 UNIT/ML Pen Generic drug:  Insulin Detemir Inject 36 Units into the skin at bedtime.   loperamide 2 MG capsule Commonly known as:  IMODIUM Take 2 mg by mouth as needed for diarrhea or loose stools.   LORazepam 0.5 MG tablet Commonly known as:  ATIVAN Take 0.5 mg by mouth 2 (two) times daily.   lovastatin 20 MG tablet Commonly known as:  MEVACOR Take 20 mg by mouth daily.   magnesium hydroxide 400 MG/5ML suspension Commonly known as:  MILK OF MAGNESIA Take 30 mLs by mouth daily as needed for mild constipation.   [place] 200-200-20 MG/5ML suspension Generic drug:  alum & mag hydroxide-simeth Take 30 mLs by mouth every 6 (six) hours as needed for indigestion or heartburn.   neomycin-bacitracin-polymyxin Oint Commonly known as:  NEOSPORIN Apply 1 application topically as needed for wound care (left shoulder).   nitrofurantoin (macrocrystal-monohydrate) 100 MG capsule Commonly known as:  MACROBID Take 1 capsule (100 mg total) by mouth 2 (two) times daily for 7 days.   nystatin cream Commonly known as:  MYCOSTATIN Apply to affected area 2 times daily What changed:    how much to take  how to take this  when to take this  additional instructions   risperiDONE 0.5 MG tablet Commonly known as:  RISPERDAL Take 0.5-1 mg by mouth See admin instructions. 0.5 mg by mouth every morning and take 1 mg by mouth at bedtime.   sulfamethoxazole-trimethoprim 800-160 MG tablet Commonly known as:  BACTRIM DS,SEPTRA DS Take 1 tablet by mouth 2 (two) times daily.   tamsulosin 0.4 MG Caps capsule Commonly known as:  FLOMAX Take 0.4 mg by mouth every evening.   tolnaftate 1 % cream Commonly known as:  TINACTIN Apply 1 application topically 2 (two) times daily.   traZODone 100 MG tablet Commonly known as:  DESYREL Take 150 mg by mouth at bedtime.   vitamin B-12 1000 MCG tablet Commonly known as:   CYANOCOBALAMIN Take 1,000 mcg by mouth every morning.   Vitamin D3 25 MCG (1000 UT) Caps Take 1,000 Units by mouth daily after breakfast.       Allergies  Allergen Reactions  . Bee Venom Other (See Comments)    Bump where stung and took days to go away  . Glucophage [Metformin] Diarrhea    "i will never take it again -stool incontinent "  . Shellfish Allergy Swelling  . Grass Extracts [Gramineae Pollens]   . Tobacco [Nicotiana Tabacum]     Consultations: none  Procedures/Studies:  No results found. (Echo, Carotid, EGD, Colonoscopy, ERCP)    Subjective:  Resting in bed staff reports nothing new complaints Discharge Exam: Vitals:   07/04/18 2217 07/05/18 0533  BP: 140/69 110/64  Pulse: 78 67  Resp: 12 12  Temp: 98.5 F (36.9 C) 97.9 F (36.6 C)  SpO2: 98% 99%   Vitals:   07/04/18 1411 07/04/18 1930 07/04/18 2217 07/05/18 0533  BP: 119/65  140/69 110/64  Pulse: 70  78 67  Resp: 14  12 12   Temp: 98.1 F (36.7 C)  98.5 F (36.9 C) 97.9 F (36.6 C)  TempSrc: Oral  Oral Oral  SpO2: 100%  98% 99%  Weight:      Height:  5\' 8"  (1.727 m)      General: Pt is alert, awake, not in acute distress Cardiovascular: RRR, S1/S2 +, no rubs, no gallops Respiratory: CTA bilaterally, no wheezing, no rhonchi Abdominal: Soft, NT, ND, bowel sounds + Extremities: no edema, no cyanosis    The results of significant diagnostics from this hospitalization (including imaging, microbiology, ancillary and laboratory) are listed below for reference.     Microbiology: Recent Results (from the past 240 hour(s))  Urine culture     Status: Abnormal   Collection Time: 07/02/18  9:33 PM  Result Value Ref Range Status   Specimen Description   Final    URINE, CLEAN CATCH Performed at [facility], [place] [address] [place], [place] [address]    Special Requests   Final    NONE Performed at [facility], [place] [address] [place], [place]  [address]    Culture (A)  Final    >=100,000 COLONIES/mL MORGANELLA MORGANII >=100,000 COLONIES/mL METHICILLIN RESISTANT STAPHYLOCOCCUS AUREUS    Report Status 07/05/2018 FINAL  Final   Organism ID, Bacteria METHICILLIN RESISTANT STAPHYLOCOCCUS AUREUS (A)  Final   Organism ID, Bacteria MORGANELLA MORGANII (A)  Final      Susceptibility   Morganella morganii - MIC*    AMPICILLIN >=32 RESISTANT Resistant     CEFAZOLIN >=64 RESISTANT Resistant     CEFTRIAXONE >=64 RESISTANT Resistant     CIPROFLOXACIN <=  0.25 SENSITIVE Sensitive     GENTAMICIN <=1 SENSITIVE Sensitive     IMIPENEM <=0.25 SENSITIVE Sensitive     NITROFURANTOIN RESISTANT Resistant     TRIMETH/SULFA <=20 SENSITIVE Sensitive     AMPICILLIN/SULBACTAM >=32 RESISTANT Resistant     PIP/TAZO 64 INTERMEDIATE Intermediate     * >=100,000 COLONIES/mL MORGANELLA MORGANII   Methicillin resistant staphylococcus aureus - MIC*    CIPROFLOXACIN >=8 RESISTANT Resistant     GENTAMICIN <=0.5 SENSITIVE Sensitive     NITROFURANTOIN <=16 SENSITIVE Sensitive     OXACILLIN >=4 RESISTANT Resistant     TETRACYCLINE <=1 SENSITIVE Sensitive     VANCOMYCIN 1 SENSITIVE Sensitive     TRIMETH/SULFA >=320 RESISTANT Resistant     CLINDAMYCIN >=8 RESISTANT Resistant     RIFAMPIN <=0.5 SENSITIVE Sensitive     Inducible Clindamycin NEGATIVE Sensitive     * >=100,000 COLONIES/mL METHICILLIN RESISTANT STAPHYLOCOCCUS AUREUS     Labs: BNP (last 3 results) No results for input(s): BNP in the last 8760 hours. Basic Metabolic Panel: Recent Labs  Lab 07/02/18 1812  NA 137  K 4.9  CL 99  CO2 28  GLUCOSE 440*  BUN 33*  CREATININE 0.87  CALCIUM 9.5   Liver Function Tests: Recent Labs  Lab 07/02/18 1812  AST 28  ALT 33  ALKPHOS 63  BILITOT 0.7  PROT 6.9  ALBUMIN 4.2   No results for input(s): LIPASE, AMYLASE in the last 168 hours. Recent Labs  Lab 07/03/18 0601  AMMONIA 41*   CBC: Recent Labs  Lab 07/02/18 1812 07/03/18 0601  WBC 12.2*   --   HGB 14.0  --   HCT 43.3 NOT PERFORMED  MCV 94.3  --   PLT 239  --    Cardiac Enzymes: No results for input(s): CKTOTAL, CKMB, CKMBINDEX, TROPONINI in the last 168 hours. BNP: Invalid input(s): POCBNP CBG: Recent Labs  Lab 07/04/18 0902 07/04/18 1219 07/04/18 1844 07/04/18 2118 07/05/18 0744  GLUCAP 115* 128* 250* 241* 77   D-Dimer No results for input(s): DDIMER in the last 72 hours. Hgb A1c No results for input(s): HGBA1C in the last 72 hours. Lipid Profile No results for input(s): CHOL, HDL, LDLCALC, TRIG, CHOLHDL, LDLDIRECT in the last 72 hours. Thyroid function studies Recent Labs    07/03/18 0601  TSH 1.977   Anemia work up Recent Labs    07/03/18 0837  VITAMINB12 993*   Urinalysis    Component Value Date/Time   COLORURINE STRAW (A) 07/02/2018 1839   APPEARANCEUR CLOUDY (A) 07/02/2018 1839   LABSPEC 1.022 07/02/2018 1839   PHURINE 7.0 07/02/2018 1839   GLUCOSEU >=500 (A) 07/02/2018 1839   HGBUR SMALL (A) 07/02/2018 1839   BILIRUBINUR NEGATIVE 07/02/2018 1839   KETONESUR NEGATIVE 07/02/2018 1839   PROTEINUR NEGATIVE 07/02/2018 1839   NITRITE NEGATIVE 07/02/2018 1839   LEUKOCYTESUR LARGE (A) 07/02/2018 1839   Sepsis Labs Invalid input(s): PROCALCITONIN,  WBC,  LACTICIDVEN Microbiology Recent Results (from the past 240 hour(s))  Urine culture     Status: Abnormal   Collection Time: 07/02/18  9:33 PM  Result Value Ref Range Status   Specimen Description   Final    URINE, CLEAN CATCH Performed at [facility], [place] [address], [place], [place] [address]    Special Requests   Final    NONE Performed at [facility], [place] [address] [place], [place] [address]    Culture (A)  Final    >=100,000 COLONIES/mL [facility]  MORGANII >=100,000 COLONIES/mL METHICILLIN RESISTANT STAPHYLOCOCCUS AUREUS    Report Status 07/05/2018 FINAL  Final   Organism ID, Bacteria METHICILLIN RESISTANT STAPHYLOCOCCUS AUREUS (A)   Final   Organism ID, Bacteria MORGANELLA MORGANII (A)  Final      Susceptibility   Morganella morganii - MIC*    AMPICILLIN >=32 RESISTANT Resistant     CEFAZOLIN >=64 RESISTANT Resistant     CEFTRIAXONE >=64 RESISTANT Resistant     CIPROFLOXACIN <=0.25 SENSITIVE Sensitive     GENTAMICIN <=1 SENSITIVE Sensitive     IMIPENEM <=0.25 SENSITIVE Sensitive     NITROFURANTOIN RESISTANT Resistant     TRIMETH/SULFA <=20 SENSITIVE Sensitive     AMPICILLIN/SULBACTAM >=32 RESISTANT Resistant     PIP/TAZO 64 INTERMEDIATE Intermediate     * >=100,000 COLONIES/mL MORGANELLA MORGANII   Methicillin resistant staphylococcus aureus - MIC*    CIPROFLOXACIN >=8 RESISTANT Resistant     GENTAMICIN <=0.5 SENSITIVE Sensitive     NITROFURANTOIN <=16 SENSITIVE Sensitive     OXACILLIN >=4 RESISTANT Resistant     TETRACYCLINE <=1 SENSITIVE Sensitive     VANCOMYCIN 1 SENSITIVE Sensitive     TRIMETH/SULFA >=320 RESISTANT Resistant     CLINDAMYCIN >=8 RESISTANT Resistant     RIFAMPIN <=0.5 SENSITIVE Sensitive     Inducible Clindamycin NEGATIVE Sensitive     * >=100,000 COLONIES/mL METHICILLIN RESISTANT STAPHYLOCOCCUS AUREUS     Time coordinating discharge:33  minutes  SIGNED:   Georgette Shell, MD  [facility] 07/05/2018, 11:51 AM Pager   If 7PM-7AM, please contact night-coverage [email or web address] Password [email or web address]

## 2018-07-05 NOTE — Plan of Care (Addendum)
Shift report included fact that Patient has not needed a Air cabin crew.  Observed mild disorientation to time.  Appropriate to situation.  Expressive of his needs. 1941 Received Call from [facility] requesting update.  MD/PT notes and Lab results referred to and continuing ABO.

## 2018-07-05 NOTE — NC FL2 (Addendum)
[place] LEVEL OF CARE SCREENING TOOL     IDENTIFICATION  Patient Name: Hayden [name] Birthdate: [birth year] Sex: male Admission Date (Current Location): 07/02/2018  [facility] and [place] Number:  Herbalist and Address:  [facility],  [place], [place]      Provider Number: [id number]  Attending Physician Name and Address:  Georgette Shell, MD  Relative Name and Phone Number:       Current Level of Care: Hospital  Recommended Level of Care: [place]  Prior Approval Number:    Date Approved/Denied:   PASRR Number:    Discharge Plan:  ALF    Current Diagnoses: Patient Active Problem List   Diagnosis Date Noted  . Acute encephalopathy 07/02/2018  . Dementia with behavioral disturbance ([place]) 05/24/2018  . Pressure injury of skin 05/23/2018  . Cellulitis 04/17/2018  . Cellulitis, scrotum 03/09/2018  . Cellulitis of male genitalia 03/08/2018  . Depression 03/08/2018  . BPH (benign prostatic hyperplasia) 03/08/2018  . Leukocytosis 03/08/2018  . Mild dementia ([place]) 10/23/2017  . Lower urinary tract infectious disease 09/06/2017  . Hyperglycemia 09/06/2017  . ARF (acute renal failure) ([place]) 09/06/2017  . Altered mental status 09/06/2017  . Type 2 diabetes mellitus with complication ([place]) 10/93/2355  . 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    Orientation RESPIRATION BLADDER Height & Weight     Self, Person, Place  Normal Continent Weight: 140 lb (63.5 kg) Height:  5\' 8"  (172.7 cm)  BEHAVIORAL SYMPTOMS/MOOD NEUROLOGICAL BOWEL NUTRITION STATUS      Continent  No added salt  AMBULATORY STATUS COMMUNICATION OF NEEDS Skin   Supervision Verbally Normal                       Personal Care Assistance Level of Assistance  Bathing, Feeding, Dressing Bathing Assistance: Limited assistance Feeding assistance: Independent Dressing Assistance: Limited assistance     Functional  Limitations Info  Sight, Hearing, Speech Sight Info: Impaired Hearing Info: Adequate Speech Info: Adequate    SPECIAL CARE FACTORS FREQUENCY  PT (By licensed PT), OT (By licensed OT)     PT Frequency: 3x Home Health               Contractures Contractures Info: Not present    Additional Factors Info  Insulin Sliding Scale Code Status Info: Fullcode Allergies Info: Allergies: Bee Venom, Glucophage Metformin, Shellfish Allergy, Grass Extracts Gramineae Pollens, Tobacco Nicotiana Tabacum Psychotropic Info: Ativan, Desryrel, Risperdal  Insulin Sliding Scale Info: See Medication list.        Current Medications (07/05/2018):  This is the current hospital active medication list Current Facility-Administered Medications  Medication Dose Route Frequency Provider Last Rate Last Dose  . 0.9 %  sodium chloride infusion   Intravenous PRN Georgette Shell, MD      . acetaminophen (TYLENOL) tablet 650 mg  650 mg Oral Q6H PRN Opyd, Ilene Qua, MD       Or  . acetaminophen (TYLENOL) suppository 650 mg  650 mg Rectal Q6H PRN Opyd, Ilene Qua, MD      . aspirin EC tablet 81 mg  81 mg Oral Daily Opyd, Ilene Qua, MD   81 mg at 07/05/18 7322  . carbamazepine (TEGRETOL) tablet 100 mg  100 mg Oral BID Opyd, Ilene Qua, MD   100 mg at 07/05/18 0831  . ceFEPIme (MAXIPIME) 1 g in sodium chloride 0.9 % 100 mL IVPB  1  g Intravenous Q8H Opyd, Ilene Qua, MD 200 mL/hr at 07/05/18 0507 1 g at 07/05/18 0507  . docusate sodium (COLACE) capsule 100 mg  100 mg Oral Daily Opyd, Ilene Qua, MD   100 mg at 07/05/18 0831  . donepezil (ARICEPT) tablet 10 mg  10 mg Oral QHS Opyd, Ilene Qua, MD   10 mg at 07/04/18 2136  . enoxaparin (LOVENOX) injection 40 mg  40 mg Subcutaneous Q24H Opyd, Ilene Qua, MD   40 mg at 07/05/18 4098  . HYDROcodone-acetaminophen (NORCO/VICODIN) 5-325 MG per tablet 1 tablet  1 tablet Oral Q6H PRN Opyd, Ilene Qua, MD      . insulin aspart (novoLOG) injection 0-5 Units  0-5 Units Subcutaneous  QHS Vianne Bulls, MD   2 Units at 07/04/18 2139  . insulin aspart (novoLOG) injection 0-9 Units  0-9 Units Subcutaneous TID WC Opyd, Ilene Qua, MD   1 Units at 07/04/18 1300  . insulin detemir (LEVEMIR) injection 25 Units  25 Units Subcutaneous QHS Vianne Bulls, MD   25 Units at 07/04/18 2138  . LORazepam (ATIVAN) tablet 0.5 mg  0.5 mg Oral BID Opyd, Ilene Qua, MD   0.5 mg at 07/05/18 0830  . ondansetron (ZOFRAN) tablet 4 mg  4 mg Oral Q6H PRN Opyd, Ilene Qua, MD       Or  . ondansetron (ZOFRAN) injection 4 mg  4 mg Intravenous Q6H PRN Opyd, Ilene Qua, MD      . pravastatin (PRAVACHOL) tablet 20 mg  20 mg Oral q1800 Opyd, Ilene Qua, MD   20 mg at 07/04/18 1914  . risperiDONE (RISPERDAL) tablet 0.5 mg  0.5 mg Oral Daily Opyd, Ilene Qua, MD   0.5 mg at 07/05/18 0830  . risperiDONE (RISPERDAL) tablet 1 mg  1 mg Oral QHS Opyd, Ilene Qua, MD   1 mg at 07/04/18 2136  . tamsulosin (FLOMAX) capsule 0.4 mg  0.4 mg Oral QPM Opyd, Ilene Qua, MD   0.4 mg at 07/04/18 1914  . traZODone (DESYREL) tablet 150 mg  150 mg Oral QHS Opyd, Ilene Qua, MD   150 mg at 07/04/18 2136     Discharge Medications:  Medication List    STOP taking these medications   cephALEXin 500 MG capsule Commonly known as:  KEFLEX     TAKE these medications   acetaminophen 500 MG tablet Commonly known as:  TYLENOL Take 500 mg by mouth every 4 (four) hours as needed for fever or headache.   aspirin 81 MG tablet Take 81 mg by mouth every morning.   carbamazepine 200 MG tablet Commonly known as:  TEGRETOL Take 100 mg by mouth 2 (two) times daily.   cetirizine 10 MG tablet Commonly known as:  ZYRTEC Take 10 mg by mouth daily as needed for allergies.   DIMETHICONE-ZINC OXIDE EX Apply 1 application topically See admin instructions. Apply topically to buttocks and scrotum after each incontinence episode   docusate sodium 100 MG capsule Commonly known as:  COLACE Take 100 mg by mouth daily.   donepezil 10  MG tablet Commonly known as:  ARICEPT Take 1 tablet (10 mg total) by mouth at bedtime.   EPINEPHrine 0.15 MG/0.3ML injection Commonly known as:  EPIPEN JR Inject 0.15 mg into the muscle as needed for anaphylaxis.   fish oil-omega-3 fatty acids 1000 MG capsule Take 1 g by mouth 2 (two) times daily.   guaiFENesin 100 MG/5ML Soln Commonly known as:  ROBITUSSIN Take 10 mLs by mouth  every 6 (six) hours as needed for cough or to loosen phlegm.   HYDROcodone-acetaminophen 5-325 MG tablet Commonly known as:  NORCO/VICODIN Take 1 tablet by mouth every 6 (six) hours as needed for moderate pain.   LEVEMIR FLEXTOUCH 100 UNIT/ML Pen Generic drug:  Insulin Detemir Inject 36 Units into the skin at bedtime.   loperamide 2 MG capsule Commonly known as:  IMODIUM Take 2 mg by mouth as needed for diarrhea or loose stools.   LORazepam 0.5 MG tablet Commonly known as:  ATIVAN Take 0.5 mg by mouth 2 (two) times daily.   lovastatin 20 MG tablet Commonly known as:  MEVACOR Take 20 mg by mouth daily.   magnesium hydroxide 400 MG/5ML suspension Commonly known as:  MILK OF MAGNESIA Take 30 mLs by mouth daily as needed for mild constipation.   [place] 200-200-20 MG/5ML suspension Generic drug:  alum & mag hydroxide-simeth Take 30 mLs by mouth every 6 (six) hours as needed for indigestion or heartburn.   neomycin-bacitracin-polymyxin Oint Commonly known as:  NEOSPORIN Apply 1 application topically as needed for wound care (left shoulder).   nitrofurantoin (macrocrystal-monohydrate) 100 MG capsule Commonly known as:  MACROBID Take 1 capsule (100 mg total) by mouth 2 (two) times daily for 7 days.   nystatin cream Commonly known as:  MYCOSTATIN Apply to affected area 2 times daily What changed:    how much to take  how to take this  when to take this  additional instructions   risperiDONE 0.5 MG tablet Commonly known as:  RISPERDAL Take 0.5-1 mg by mouth  See admin instructions. 0.5 mg by mouth every morning and take 1 mg by mouth at bedtime.   sulfamethoxazole-trimethoprim 800-160 MG tablet Commonly known as:  BACTRIM DS,SEPTRA DS Take 1 tablet by mouth 2 (two) times daily.   tamsulosin 0.4 MG Caps capsule Commonly known as:  FLOMAX Take 0.4 mg by mouth every evening.   tolnaftate 1 % cream Commonly known as:  TINACTIN Apply 1 application topically 2 (two) times daily.   traZODone 100 MG tablet Commonly known as:  DESYREL Take 150 mg by mouth at bedtime.   vitamin B-12 1000 MCG tablet Commonly known as:  CYANOCOBALAMIN Take 1,000 mcg by mouth every morning.   Vitamin D3 25 MCG (1000 UT) Caps Take 1,000 Units by mouth daily after breakfast.        Relevant Imaging Results:  Relevant Lab Results:   Additional Information SSN   [id number]  Lia Hopping, LCSW

## 2018-07-05 NOTE — Progress Notes (Signed)
At 9:25 pm attempted to call the nursing home [facility] to give verbal report, calls were transferred multiple times and unable to talk to the nurse in charge. Patient was given Scheduled PO 10 pm meds. CBG is 180 mg during transfer. Note was made and given to the transport personal for [facility] nurse in charge.

## 2018-07-05 NOTE — Progress Notes (Addendum)
At 1808, I attempted to give report to the receiving nurse at the [facility], but was unable to get through to anyone. I will call back and try again.    At [place], I attempted to give report again to the receiving nurse at the [facility], but no one answered. The person directing my call was provided with the number to the nurses station for report whenever she is able to get in contact with the receiving nurse.

## 2018-07-05 NOTE — Consult Note (Signed)
   [facility] CM Inpatient Consult   07/05/2018  Hayden [name] Apr 22, 1941 [id number]    Patient screened for potential [facility] Care Management services due to unplanned readmission risk score of 52% (extreme) and multiple hospitalizations.  Chart reviewed. Noted [name] is slated to return to ALF today. No identifiable [facility] Care Management needs at this time.     Marthenia Rolling, MSN-Ed, RN,BSN [facility] Liaison [phone]

## 2018-07-05 NOTE — Progress Notes (Addendum)
D/C Summary sent/ Signed FL2 sent.  Nurse call report to: [phone] [facility] to transport.  Patient brother [name] notified of discharge back to [facility].   Kathrin Greathouse, Marlinda Mike, MSW Clinical Social Worker  [phone] 07/05/2018  4:27 PM

## 2018-07-05 NOTE — Clinical Social Work Note (Signed)
Clinical Social Work Assessment  Patient Details  Name: Hayden [name] MRN: [id number] Date of Birth: 1941/01/21  Date of referral:  07/05/18               Reason for consult:  Discharge Planning                Permission sought to share information with:  Family Supports Permission granted to share information::  Yes, Verbal Permission Granted  Name::        Agency::  [facility]   Relationship::     Contact Information:     Housing/Transportation Living arrangements for the past 2 months:  [place] of Information:  Patient Patient Interpreter Needed:  None Criminal Activity/Legal Involvement Pertinent to Current Situation/Hospitalization:  No - Comment as needed Significant Relationships:  Adult Children Lives with:  Facility Resident Do you feel safe going back to the place where you live?  Yes Need for family participation in patient care:  Yes  Care giving concerns:   Patient returning to  ALF-[facility].    Social Worker assessment / plan: Patient alert x2. Patient understands he will return to [place]. Patient gave CSW permsission to provide updates to his brother. CSW reached out to the patient brother and inform him the patient will return to ALF today with Home Health to follow for rehab. The patient needs some assistance with bathing and dressing.  CSW discussed patient discharge plan with Med Tech. Hayden [name].   FL2 Completed.   Plan: SNF   Employment status:  Retired Forensic scientist:  Medicare PT Recommendations:  Home with [place] / Referral to community resources:     Patient/Family's Response to care:  Agreeable and Responding well to care.   Patient/Family's Understanding of and Emotional Response to Diagnosis, Current Treatment, and Prognosis: CSW discussed briefly discussed the patient diagnosis with the patient brother. He was unaware the patient was admitted to the hospital. Hayden [name] informed  physician so that a thorough medical update will be given.   Emotional Assessment Appearance:  Appears stated age Attitude/Demeanor/Rapport:    Affect (typically observed):  Calm Orientation:  Oriented to Self, Oriented to Place Alcohol / Substance use:  Not Applicable Psych involvement (Current and /or in the community):  No (Comment)  Discharge Needs  Concerns to be addressed:  Discharge Planning Concerns Readmission within the last 30 days:  No Current discharge risk:  None Barriers to Discharge:  No Barriers Identified   Hayden Hopping, LCSW 07/05/2018, 11:07 AM

## 2018-07-08 DIAGNOSIS — S3130XD Unspecified open wound of scrotum and testes, subsequent encounter: Secondary | ICD-10-CM | POA: Diagnosis not present

## 2018-07-08 DIAGNOSIS — B372 Candidiasis of skin and nail: Secondary | ICD-10-CM | POA: Diagnosis not present

## 2018-07-08 DIAGNOSIS — F0391 Unspecified dementia with behavioral disturbance: Secondary | ICD-10-CM | POA: Diagnosis not present

## 2018-07-08 DIAGNOSIS — E1142 Type 2 diabetes mellitus with diabetic polyneuropathy: Secondary | ICD-10-CM | POA: Diagnosis not present

## 2018-07-08 DIAGNOSIS — N390 Urinary tract infection, site not specified: Secondary | ICD-10-CM | POA: Diagnosis not present

## 2018-07-08 DIAGNOSIS — B961 Klebsiella pneumoniae [K. pneumoniae] as the cause of diseases classified elsewhere: Secondary | ICD-10-CM | POA: Diagnosis not present

## 2018-07-12 DIAGNOSIS — F418 Other specified anxiety disorders: Secondary | ICD-10-CM | POA: Diagnosis not present

## 2018-07-12 DIAGNOSIS — E11319 Type 2 diabetes mellitus with unspecified diabetic retinopathy without macular edema: Secondary | ICD-10-CM | POA: Diagnosis not present

## 2018-07-12 DIAGNOSIS — L22 Diaper dermatitis: Secondary | ICD-10-CM | POA: Diagnosis not present

## 2018-07-12 DIAGNOSIS — Z760 Encounter for issue of repeat prescription: Secondary | ICD-10-CM | POA: Diagnosis not present

## 2018-07-12 DIAGNOSIS — G300 Alzheimer's disease with early onset: Secondary | ICD-10-CM | POA: Diagnosis not present

## 2018-07-12 DIAGNOSIS — N492 Inflammatory disorders of scrotum: Secondary | ICD-10-CM | POA: Diagnosis not present

## 2018-07-12 DIAGNOSIS — F0281 Dementia in other diseases classified elsewhere with behavioral disturbance: Secondary | ICD-10-CM | POA: Diagnosis not present

## 2018-07-12 DIAGNOSIS — B9562 Methicillin resistant Staphylococcus aureus infection as the cause of diseases classified elsewhere: Secondary | ICD-10-CM | POA: Diagnosis not present

## 2018-07-15 ENCOUNTER — Ambulatory Visit: Payer: Self-pay | Admitting: Psychiatry

## 2018-07-22 DIAGNOSIS — S3130XD Unspecified open wound of scrotum and testes, subsequent encounter: Secondary | ICD-10-CM | POA: Diagnosis not present

## 2018-07-22 DIAGNOSIS — F0391 Unspecified dementia with behavioral disturbance: Secondary | ICD-10-CM | POA: Diagnosis not present

## 2018-07-22 DIAGNOSIS — B372 Candidiasis of skin and nail: Secondary | ICD-10-CM | POA: Diagnosis not present

## 2018-07-22 DIAGNOSIS — N390 Urinary tract infection, site not specified: Secondary | ICD-10-CM | POA: Diagnosis not present

## 2018-07-22 DIAGNOSIS — E1142 Type 2 diabetes mellitus with diabetic polyneuropathy: Secondary | ICD-10-CM | POA: Diagnosis not present

## 2018-07-22 DIAGNOSIS — B961 Klebsiella pneumoniae [K. pneumoniae] as the cause of diseases classified elsewhere: Secondary | ICD-10-CM | POA: Diagnosis not present

## 2018-07-23 DIAGNOSIS — R197 Diarrhea, unspecified: Secondary | ICD-10-CM | POA: Diagnosis not present

## 2018-07-23 DIAGNOSIS — R4182 Altered mental status, unspecified: Secondary | ICD-10-CM | POA: Diagnosis not present

## 2018-07-23 DIAGNOSIS — Z7982 Long term (current) use of aspirin: Secondary | ICD-10-CM | POA: Diagnosis not present

## 2018-07-23 DIAGNOSIS — Z794 Long term (current) use of insulin: Secondary | ICD-10-CM | POA: Diagnosis not present

## 2018-07-23 DIAGNOSIS — Z79899 Other long term (current) drug therapy: Secondary | ICD-10-CM | POA: Diagnosis not present

## 2018-07-23 DIAGNOSIS — F329 Major depressive disorder, single episode, unspecified: Secondary | ICD-10-CM | POA: Diagnosis not present

## 2018-07-27 DIAGNOSIS — G301 Alzheimer's disease with late onset: Secondary | ICD-10-CM | POA: Diagnosis not present

## 2018-07-27 DIAGNOSIS — F4324 Adjustment disorder with disturbance of conduct: Secondary | ICD-10-CM | POA: Diagnosis not present

## 2018-07-27 DIAGNOSIS — F419 Anxiety disorder, unspecified: Secondary | ICD-10-CM | POA: Diagnosis not present

## 2018-07-27 DIAGNOSIS — G4700 Insomnia, unspecified: Secondary | ICD-10-CM | POA: Diagnosis not present

## 2018-07-27 DIAGNOSIS — F331 Major depressive disorder, recurrent, moderate: Secondary | ICD-10-CM | POA: Diagnosis not present

## 2018-07-28 DIAGNOSIS — E119 Type 2 diabetes mellitus without complications: Secondary | ICD-10-CM | POA: Diagnosis not present

## 2018-07-28 DIAGNOSIS — D649 Anemia, unspecified: Secondary | ICD-10-CM | POA: Diagnosis not present

## 2018-08-02 DIAGNOSIS — F418 Other specified anxiety disorders: Secondary | ICD-10-CM | POA: Diagnosis not present

## 2018-08-02 DIAGNOSIS — L22 Diaper dermatitis: Secondary | ICD-10-CM | POA: Diagnosis not present

## 2018-08-02 DIAGNOSIS — N492 Inflammatory disorders of scrotum: Secondary | ICD-10-CM | POA: Diagnosis not present

## 2018-08-02 DIAGNOSIS — F0281 Dementia in other diseases classified elsewhere with behavioral disturbance: Secondary | ICD-10-CM | POA: Diagnosis not present

## 2018-08-02 DIAGNOSIS — E11319 Type 2 diabetes mellitus with unspecified diabetic retinopathy without macular edema: Secondary | ICD-10-CM | POA: Diagnosis not present

## 2018-08-06 DIAGNOSIS — N390 Urinary tract infection, site not specified: Secondary | ICD-10-CM | POA: Diagnosis not present

## 2018-08-06 DIAGNOSIS — S3130XD Unspecified open wound of scrotum and testes, subsequent encounter: Secondary | ICD-10-CM | POA: Diagnosis not present

## 2018-08-06 DIAGNOSIS — B372 Candidiasis of skin and nail: Secondary | ICD-10-CM | POA: Diagnosis not present

## 2018-08-06 DIAGNOSIS — E1142 Type 2 diabetes mellitus with diabetic polyneuropathy: Secondary | ICD-10-CM | POA: Diagnosis not present

## 2018-08-06 DIAGNOSIS — F0391 Unspecified dementia with behavioral disturbance: Secondary | ICD-10-CM | POA: Diagnosis not present

## 2018-08-06 DIAGNOSIS — B961 Klebsiella pneumoniae [K. pneumoniae] as the cause of diseases classified elsewhere: Secondary | ICD-10-CM | POA: Diagnosis not present

## 2018-08-14 DIAGNOSIS — Z794 Long term (current) use of insulin: Secondary | ICD-10-CM | POA: Diagnosis not present

## 2018-08-14 DIAGNOSIS — G300 Alzheimer's disease with early onset: Secondary | ICD-10-CM | POA: Diagnosis not present

## 2018-08-14 DIAGNOSIS — R2689 Other abnormalities of gait and mobility: Secondary | ICD-10-CM | POA: Diagnosis not present

## 2018-08-14 DIAGNOSIS — F0281 Dementia in other diseases classified elsewhere with behavioral disturbance: Secondary | ICD-10-CM | POA: Diagnosis not present

## 2018-08-14 DIAGNOSIS — M6281 Muscle weakness (generalized): Secondary | ICD-10-CM | POA: Diagnosis not present

## 2018-08-14 DIAGNOSIS — G309 Alzheimer's disease, unspecified: Secondary | ICD-10-CM | POA: Diagnosis not present

## 2018-08-14 DIAGNOSIS — E119 Type 2 diabetes mellitus without complications: Secondary | ICD-10-CM | POA: Diagnosis not present

## 2018-08-16 DIAGNOSIS — E11319 Type 2 diabetes mellitus with unspecified diabetic retinopathy without macular edema: Secondary | ICD-10-CM | POA: Diagnosis not present

## 2018-08-16 DIAGNOSIS — F418 Other specified anxiety disorders: Secondary | ICD-10-CM | POA: Diagnosis not present

## 2018-08-16 DIAGNOSIS — N492 Inflammatory disorders of scrotum: Secondary | ICD-10-CM | POA: Diagnosis not present

## 2018-08-16 DIAGNOSIS — L22 Diaper dermatitis: Secondary | ICD-10-CM | POA: Diagnosis not present

## 2018-08-16 DIAGNOSIS — F0281 Dementia in other diseases classified elsewhere with behavioral disturbance: Secondary | ICD-10-CM | POA: Diagnosis not present

## 2018-08-16 DIAGNOSIS — G300 Alzheimer's disease with early onset: Secondary | ICD-10-CM | POA: Diagnosis not present

## 2018-08-24 DIAGNOSIS — G4700 Insomnia, unspecified: Secondary | ICD-10-CM | POA: Diagnosis not present

## 2018-08-24 DIAGNOSIS — F331 Major depressive disorder, recurrent, moderate: Secondary | ICD-10-CM | POA: Diagnosis not present

## 2018-08-24 DIAGNOSIS — F4324 Adjustment disorder with disturbance of conduct: Secondary | ICD-10-CM | POA: Diagnosis not present

## 2018-08-24 DIAGNOSIS — G301 Alzheimer's disease with late onset: Secondary | ICD-10-CM | POA: Diagnosis not present

## 2018-08-24 DIAGNOSIS — F419 Anxiety disorder, unspecified: Secondary | ICD-10-CM | POA: Diagnosis not present

## 2018-08-30 DIAGNOSIS — F418 Other specified anxiety disorders: Secondary | ICD-10-CM | POA: Diagnosis not present

## 2018-08-30 DIAGNOSIS — G300 Alzheimer's disease with early onset: Secondary | ICD-10-CM | POA: Diagnosis not present

## 2018-08-30 DIAGNOSIS — N492 Inflammatory disorders of scrotum: Secondary | ICD-10-CM | POA: Diagnosis not present

## 2018-08-30 DIAGNOSIS — F0281 Dementia in other diseases classified elsewhere with behavioral disturbance: Secondary | ICD-10-CM | POA: Diagnosis not present

## 2018-08-30 DIAGNOSIS — E11319 Type 2 diabetes mellitus with unspecified diabetic retinopathy without macular edema: Secondary | ICD-10-CM | POA: Diagnosis not present

## 2018-09-02 DIAGNOSIS — E785 Hyperlipidemia, unspecified: Secondary | ICD-10-CM | POA: Diagnosis not present

## 2018-09-02 DIAGNOSIS — D649 Anemia, unspecified: Secondary | ICD-10-CM | POA: Diagnosis not present

## 2018-09-02 DIAGNOSIS — Z5181 Encounter for therapeutic drug level monitoring: Secondary | ICD-10-CM | POA: Diagnosis not present

## 2018-09-02 DIAGNOSIS — E119 Type 2 diabetes mellitus without complications: Secondary | ICD-10-CM | POA: Diagnosis not present

## 2018-09-06 DIAGNOSIS — F418 Other specified anxiety disorders: Secondary | ICD-10-CM | POA: Diagnosis not present

## 2018-09-06 DIAGNOSIS — E11319 Type 2 diabetes mellitus with unspecified diabetic retinopathy without macular edema: Secondary | ICD-10-CM | POA: Diagnosis not present

## 2018-09-06 DIAGNOSIS — G300 Alzheimer's disease with early onset: Secondary | ICD-10-CM | POA: Diagnosis not present

## 2018-09-06 DIAGNOSIS — F0281 Dementia in other diseases classified elsewhere with behavioral disturbance: Secondary | ICD-10-CM | POA: Diagnosis not present

## 2018-09-06 DIAGNOSIS — L662 Folliculitis decalvans: Secondary | ICD-10-CM | POA: Diagnosis not present

## 2018-09-06 DIAGNOSIS — N492 Inflammatory disorders of scrotum: Secondary | ICD-10-CM | POA: Diagnosis not present

## 2018-09-08 DIAGNOSIS — M79675 Pain in left toe(s): Secondary | ICD-10-CM | POA: Diagnosis not present

## 2018-09-08 DIAGNOSIS — B351 Tinea unguium: Secondary | ICD-10-CM | POA: Diagnosis not present

## 2018-09-08 DIAGNOSIS — M79674 Pain in right toe(s): Secondary | ICD-10-CM | POA: Diagnosis not present

## 2018-09-13 DIAGNOSIS — G300 Alzheimer's disease with early onset: Secondary | ICD-10-CM | POA: Diagnosis not present

## 2018-09-13 DIAGNOSIS — E11319 Type 2 diabetes mellitus with unspecified diabetic retinopathy without macular edema: Secondary | ICD-10-CM | POA: Diagnosis not present

## 2018-09-13 DIAGNOSIS — F418 Other specified anxiety disorders: Secondary | ICD-10-CM | POA: Diagnosis not present

## 2018-09-13 DIAGNOSIS — F0281 Dementia in other diseases classified elsewhere with behavioral disturbance: Secondary | ICD-10-CM | POA: Diagnosis not present

## 2018-09-13 DIAGNOSIS — M6281 Muscle weakness (generalized): Secondary | ICD-10-CM | POA: Diagnosis not present

## 2018-09-28 DIAGNOSIS — F333 Major depressive disorder, recurrent, severe with psychotic symptoms: Secondary | ICD-10-CM | POA: Diagnosis not present

## 2018-09-28 DIAGNOSIS — F4324 Adjustment disorder with disturbance of conduct: Secondary | ICD-10-CM | POA: Diagnosis not present

## 2018-09-28 DIAGNOSIS — G4700 Insomnia, unspecified: Secondary | ICD-10-CM | POA: Diagnosis not present

## 2018-09-28 DIAGNOSIS — G301 Alzheimer's disease with late onset: Secondary | ICD-10-CM | POA: Diagnosis not present

## 2018-09-28 DIAGNOSIS — F419 Anxiety disorder, unspecified: Secondary | ICD-10-CM | POA: Diagnosis not present

## 2018-10-05 DIAGNOSIS — G309 Alzheimer's disease, unspecified: Secondary | ICD-10-CM | POA: Diagnosis not present

## 2018-10-05 DIAGNOSIS — Z794 Long term (current) use of insulin: Secondary | ICD-10-CM | POA: Diagnosis not present

## 2018-10-05 DIAGNOSIS — M6281 Muscle weakness (generalized): Secondary | ICD-10-CM | POA: Diagnosis not present

## 2018-10-05 DIAGNOSIS — F0281 Dementia in other diseases classified elsewhere with behavioral disturbance: Secondary | ICD-10-CM | POA: Diagnosis not present

## 2018-10-05 DIAGNOSIS — R2681 Unsteadiness on feet: Secondary | ICD-10-CM | POA: Diagnosis not present

## 2018-10-05 DIAGNOSIS — E119 Type 2 diabetes mellitus without complications: Secondary | ICD-10-CM | POA: Diagnosis not present

## 2018-12-03 ENCOUNTER — Encounter: Payer: Self-pay | Admitting: Neurology

## 2018-12-20 ENCOUNTER — Telehealth (INDEPENDENT_AMBULATORY_CARE_PROVIDER_SITE_OTHER): Payer: Medicare Other | Admitting: Neurology

## 2018-12-20 ENCOUNTER — Other Ambulatory Visit: Payer: Self-pay

## 2018-12-20 ENCOUNTER — Encounter: Payer: Self-pay | Admitting: Neurology

## 2018-12-20 ENCOUNTER — Ambulatory Visit: Payer: Medicare Other | Admitting: Neurology

## 2018-12-20 VITALS — Temp 98.5°F | Ht 68.0 in | Wt 162.5 lb

## 2018-12-20 DIAGNOSIS — F0391 Unspecified dementia with behavioral disturbance: Secondary | ICD-10-CM

## 2018-12-20 DIAGNOSIS — F03B18 Unspecified dementia, moderate, with other behavioral disturbance: Secondary | ICD-10-CM

## 2018-12-20 NOTE — Progress Notes (Signed)
Virtual Visit via Telephone Note The purpose of this virtual visit is to provide medical care while limiting exposure to the novel coronavirus.    Consent was obtained for phone visit:  Yes.   Answered questions that patient had about telehealth interaction:  Yes.   I discussed the limitations, risks, security and privacy concerns of performing an evaluation and management service by telephone. I also discussed with the patient that there may be a patient responsible charge related to this service. The patient expressed understanding and agreed to proceed.  Pt location: Home Physician Location: office Name of referring provider:  No ref. provider found I connected with .Hayden [name] at patients initiation/request on 12/20/2018 at  3:30 PM EDT by telephone and verified that I am speaking with the correct person using two identifiers.  Pt MRN:  [id number] Pt DOB:  06/24/1941   History of Present Illness:  The patient was last seen in November 2019 for moderate dementia with behavioral disturbance. [facility] staff Erlene Quan helps provide additional information. [name] reports that his memory is fair. He is able to answer questions during today's phone visit, but repeats the same phrase several times during the visit. He was in the hospital several times for behavioral issues/agitation, and has been seeing Psychiatry, last visit was a month ago. He is on Donepezil 10mg  daily, Tegretol 200mg  BID, Trazodone 200mg  qhs, and Risperdal 1mg . He denies any side effects to his medications. He denies any headaches, dizziness, vision changes, no recent falls. Staff administers medications. He does not drive. Erlene Quan reports that he gets agitated sometimes and has raised his fist at some of the women. If he is having any hallucinations, it may be auditory, from time to time he says there is a search for his wife and tells Erlene Quan to call off the search.   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 He reports his memory is "shot." History is mostly obtained from Epic,patient is a poor historian but able to provide some history, his caregiver has been with him only for the past month. He reports that he stopped driving 1-2 years ago, he does recall getting lost driving a few times. He recalls an episode where he started walking to the bank and a family member saw him on the street and asked him to ride the car. It is unclear when this occurred. He is quite verbose and sometimes tangential, with loose associations. His son has been in charge of finances for the past 2-3 years, he does note he was forgetting bill payments. He states his son has an "iron grip on my life." He has caregivers staying 16 hours a day with him, fixing meals and checking he takes his medications. He takes his medications by himself, once in a while she helps. He is independent with dressing and bathing. His caregiver feels he may need someone with him at night, she thinks he is up at night. No clear wandering behavior. He has a history of insomnia "I have nothing but hard nights," Trazodone does not help with sleep much. He reports his son "unofficially bumped my dose of Ambien" which is much for effective for him, he will discuss this with Dr. Orland Mustard. He was admitted to [facility] last month for altered mental status. He had an MRI brain without contrast which I personally reviewed, there were no acute  changes, there was moderate chronic microvascular disease, moderate diffuse atrophy. There was also note of increased signal and decreased size in the left posterior hippocampus. He denies any significant head injuries. He denies any seizure-like symptoms, no olfactory/gustatory hallucinations, focal numbness/tingling/weakness. His caregiver denies any staring/unresponsive episodes.   He is  occasionally wobbly when he gets up. 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 He denies any neck/back pain. He has urinary incontinence and occasional bowel incontinence. He denies any headaches, diplopia, dysarthria/dysphagia, anosmia, or tremors. His caregiver denies any hallucinations or significant behavioral issues, he rarely gets cranky when tired/overworked or frustrated due to his memory. No family history of dementia. He denies alcohol intake.     Observations/Objective:  Limited due to nature of phone visit. He is awake, alert, answers questions and appears less tangential today, repeats himself several times.  Montreal Cognitive Assessment Blind 12/20/2018  Attention: Read list of digits (0/2) 2  Attention: Read list of letters (0/1) 1  Attention: Serial 7 subtraction starting at 100 (0/3) 3  Language: Repeat phrase (0/2) 1  Language : Fluency (0/1) 1  Abstraction (0/2) 2  Delayed Recall (0/5) 1  Orientation (0/6) 4  Total 15  Adjusted Score (based on education) 15/22     Assessment and Plan:   This is a pleasant 78 yo RH man with a history of  hyperlipidemia, diabetes, CLL, with moderate dementia with behavioral disturbance. [place] Blind (done over phone) today 15/22 (MMSE 26/30 in November 2019, 25/30 in April 2019). Continue Donepezil 10mg  daily. Main issue has been behavioral changes, he is now seeing Psychiatry with adjustment in medications. Continue with 24/7 supervision. He will follow-up in 6 months and knows to call for any changes.    Follow Up Instructions:    -I discussed the assessment and treatment plan with the patient. The patient was provided an opportunity to ask questions and all were answered. The patient agreed with the plan and demonstrated an understanding of the instructions.   The patient was advised to call back or seek an in-person evaluation if the symptoms worsen or if the condition fails to improve as anticipated.    Total Time spent in visit  with the patient was:  25 minutes, of which 100% of the time was spent in counseling and/or coordinating care on the above.   Pt understands and agrees with the plan of care outlined.     Cameron Sprang, MD

## 2018-12-23 ENCOUNTER — Emergency Department (HOSPITAL_COMMUNITY)
Admission: EM | Admit: 2018-12-23 | Discharge: 2018-12-23 | Disposition: A | Discharge status: Home / Self Care | Payer: Medicare Other | Attending: Emergency Medicine | Admitting: Emergency Medicine

## 2018-12-23 ENCOUNTER — Emergency Department (HOSPITAL_COMMUNITY): Payer: Medicare Other

## 2018-12-23 ENCOUNTER — Encounter (HOSPITAL_COMMUNITY): Payer: Self-pay

## 2018-12-23 ENCOUNTER — Other Ambulatory Visit: Payer: Self-pay

## 2018-12-23 DIAGNOSIS — M545 Low back pain, unspecified: Secondary | ICD-10-CM

## 2018-12-23 DIAGNOSIS — Z7984 Long term (current) use of oral hypoglycemic drugs: Secondary | ICD-10-CM | POA: Diagnosis not present

## 2018-12-23 DIAGNOSIS — E119 Type 2 diabetes mellitus without complications: Secondary | ICD-10-CM | POA: Diagnosis not present

## 2018-12-23 DIAGNOSIS — F0390 Unspecified dementia without behavioral disturbance: Secondary | ICD-10-CM | POA: Insufficient documentation

## 2018-12-23 DIAGNOSIS — Z79899 Other long term (current) drug therapy: Secondary | ICD-10-CM | POA: Insufficient documentation

## 2018-12-23 NOTE — ED Triage Notes (Signed)
Pt BIBA from [facility] memory care unit. Non traumatic back pain x 10 days. Pt states pain is in lumbar region.

## 2018-12-23 NOTE — ED Provider Notes (Signed)
[facility] DEPT Provider Note   CSN: [id number] Arrival date & time: 12/23/18  1632     History   Chief Complaint Chief Complaint  Patient presents with   Back Pain    HPI Hayden [name] is a 78 y.o. male.     Patient brought in by EMS from [facility] memory care unit.  Patient is been complaining of back pain for 10 days no evidence of any trauma or fall.  Patient states pains in the lumbar area.  Denies any pain radiating to his legs or any pain with movement of his legs.  Or any numbness or weakness in his feet.  But patient does have a history of dementia.     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   Patient attempts to grab and or grope male patients   Leukemia ([place]) chronic lymphocytic   2008 diagnosed-monitored Dr     Patient Active Problem List   Diagnosis Date Noted   Acute encephalopathy 07/02/2018   Dementia with behavioral disturbance ([place]) 05/24/2018   Pressure injury of skin 05/23/2018   Cellulitis 04/17/2018   Cellulitis, scrotum 03/09/2018   Cellulitis of male genitalia 03/08/2018   Depression 03/08/2018   BPH (benign prostatic hyperplasia) 03/08/2018   Leukocytosis 03/08/2018   Mild dementia ([place]) 10/23/2017   Lower urinary tract infectious disease 09/06/2017   Hyperglycemia 09/06/2017   ARF (acute renal failure) ([place]) 09/06/2017   Altered mental status 09/06/2017   Type 2 diabetes mellitus without complication, with long-term current use of insulin ([place]) 04/02/2017   Cubital tunnel syndrome on right 12/23/2016   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   Bilateral hand pain 12/09/2016   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    Past Surgical History:  Procedure  Laterality Date   ADENOIDECTOMY     CARPAL TUNNEL RELEASE     rt hand   CATARACT EXTRACTION, BILATERAL     CERVICAL FUSION  1998   CYSTOSCOPY  07/26/2012   Procedure: CYSTOSCOPY;  Surgeon: Ailene Rud, MD;  Location: [facility];  Service: Urology;  Laterality: N/A;  DIAGNOSTIC CYSTO PROSTATE ULTRASOUND       PROSTATE BIOPSY  07/26/2012   Procedure: PROSTATE BIOPSY;  Surgeon: Ailene Rud, MD;  Location: [facility];  Service: Urology;  Laterality: N/A;   retinal micro aneurysms     tonsil     VASECTOMY     VITRECTOMY          Home Medications    Prior to Admission medications   Medication Sig Start Date End Date Taking? Authorizing Provider  acetaminophen (TYLENOL) 500 MG tablet Take 500 mg by mouth every 4 (four) hours as needed for fever or headache.    [provider]  alum & mag hydroxide-simeth (MINTOX REGULAR STRENGTH) 200-200-20 MG/5ML suspension Take 30 mLs by mouth every 6 (six) hours as needed for indigestion or heartburn.    [provider]  aspirin 81 MG tablet Take 81 mg by mouth every morning.     [provider]  carbamazepine (TEGRETOL) 200 MG tablet Take 100 mg by mouth 2 (two) times daily.    [provider]  cetirizine (ZYRTEC) 10 MG tablet Take 10 mg  by mouth daily as needed for allergies.     [provider]  Cholecalciferol (VITAMIN D3) 1000 UNITS CAPS Take 1,000 Units by mouth daily after breakfast.     [provider]  DIMETHICONE-ZINC OXIDE EX Apply 1 application topically See admin instructions. Apply topically to buttocks and scrotum after each incontinence episode    [provider]  docusate sodium (COLACE) 100 MG capsule Take 100 mg by mouth daily.     [provider]  donepezil (ARICEPT) 10 MG tablet Take 1 tablet (10 mg total) by mouth at bedtime. 05/03/18   Cameron Sprang, MD  EPINEPHrine (EPIPEN JR) 0.15 MG/0.3ML  injection Inject 0.15 mg into the muscle as needed for anaphylaxis.    [provider]  fish oil-omega-3 fatty acids 1000 MG capsule Take 1 g by mouth 2 (two) times daily.     [provider]  guaiFENesin (ROBITUSSIN) 100 MG/5ML SOLN Take 10 mLs by mouth every 6 (six) hours as needed for cough or to loosen phlegm.    [provider]  HYDROcodone-acetaminophen (NORCO/VICODIN) 5-325 MG tablet Take 1 tablet by mouth every 6 (six) hours as needed for moderate pain.    [provider]  LEVEMIR FLEXTOUCH 100 UNIT/ML Pen Inject 36 Units into the skin at bedtime.  07/16/15   [provider]  loperamide (IMODIUM) 2 MG capsule Take 2 mg by mouth as needed for diarrhea or loose stools.    [provider]  LORazepam (ATIVAN) 0.5 MG tablet Take 0.5 mg by mouth 2 (two) times daily.    [provider]  lovastatin (MEVACOR) 20 MG tablet Take 20 mg by mouth daily.    [provider]  magnesium hydroxide (MILK OF MAGNESIA) 400 MG/5ML suspension Take 30 mLs by mouth daily as needed for mild constipation.    [provider]  neomycin-bacitracin-polymyxin (NEOSPORIN) OINT Apply 1 application topically as needed for wound care (left shoulder).    [provider]  nystatin cream (MYCOSTATIN) Apply to affected area 2 times daily Patient taking differently: Apply 1 application topically 2 (two) times daily.  04/23/18   [name], PA-C  risperiDONE (RISPERDAL) 0.5 MG tablet Take 0.5-1 mg by mouth See admin instructions. 0.5 mg by mouth every morning and take 1 mg by mouth at bedtime.    [provider]  sulfamethoxazole-trimethoprim (BACTRIM DS,SEPTRA DS) 800-160 MG tablet Take 1 tablet by mouth 2 (two) times daily. 07/05/18   Georgette Shell, MD  tamsulosin (FLOMAX) 0.4 MG CAPS capsule Take 0.4 mg by mouth every evening.     [provider]  tolnaftate (TINACTIN) 1 % cream Apply 1 application topically 2 (two) times  daily. 05/27/18   Isla Pence, MD  traZODone (DESYREL) 100 MG tablet Take 150 mg by mouth at bedtime.    [provider]  vitamin B-12 (CYANOCOBALAMIN) 1000 MCG tablet Take 1,000 mcg by mouth every morning.     [provider]  calcium citrate-vitamin D (CITRACAL+D) 315-200 MG-UNIT per tablet Take 1 tablet by mouth daily.  09/04/11  [provider]  terazosin (HYTRIN) 5 MG capsule Take 5 mg by mouth at bedtime.  09/04/11  [provider]    Family History Family History  Problem Relation Age of Onset   Diabetes Father    Diabetes Sister    Diabetes Brother     Social History Social History   Tobacco Use   Smoking status: Never Smoker   Smokeless tobacco: Never Used  Substance Use Topics   Alcohol use: No    Frequency: Never   Drug use: No     Allergies   Bee venom, Glucophage [metformin], Shellfish allergy, Grass extracts [gramineae pollens], and Tobacco [nicotiana tabacum]   Review of Systems Review of Systems  Unable to perform ROS: Dementia  Musculoskeletal: Positive for back pain.     Physical Exam Updated Vital Signs BP (!) 174/85 (BP Location: Right Arm)    Pulse 76    Temp 98.1 F (36.7 C) (Oral)    Resp 16    Wt 73.7 kg    SpO2 98%    BMI 24.70 kg/m   Physical Exam Vitals signs and nursing note reviewed.  Constitutional:      General: He is not in acute distress.    Appearance: Normal appearance. He is well-developed.  HENT:     Head: Normocephalic and atraumatic.  Eyes:     Extraocular Movements: Extraocular movements intact.     Conjunctiva/sclera: Conjunctivae normal.     Pupils: Pupils are equal, round, and reactive to light.  Neck:     Musculoskeletal: Normal range of motion and neck supple.  Cardiovascular:     Rate and Rhythm: Normal rate and regular rhythm.     Heart sounds: No murmur.  Pulmonary:     Effort: Pulmonary effort is normal. No respiratory distress.     Breath sounds: Normal breath  sounds.  Abdominal:     Palpations: Abdomen is soft.     Tenderness: There is no abdominal tenderness.  Musculoskeletal:        General: No tenderness.     Comments: No tenderness to palpation to the lumbar part of the back.  Patient also with excellent movement of the legs.  Seems to cause no pain.  Good strength in the feet.  No obvious sensory deficit.  Skin:    General: Skin is warm and dry.     Capillary Refill: Capillary refill takes less than 2 seconds.  Neurological:     General: No focal deficit present.     Mental Status: He is alert. Mental status is at baseline.     Cranial Nerves: No cranial nerve deficit.     Motor: No weakness.      ED Treatments / Results  Labs (all labs ordered are listed, but only abnormal results are displayed) Labs Reviewed - No data to display  EKG    Radiology Ct Lumbar Spine Wo Contrast  Result Date: 12/23/2018 CLINICAL DATA:  Initial evaluation for acute back pain for 1 month. No injury. EXAM: CT LUMBAR SPINE WITHOUT CONTRAST TECHNIQUE: Multidetector CT imaging of the lumbar spine was performed without intravenous contrast administration. Multiplanar CT image reconstructions were also generated. COMPARISON:  None. FINDINGS: Segmentation: Standard. Lowest well-formed disc labeled the L5-S1 level. Alignment: Levoscoliosis with apex at L3-4. alignment otherwise normal with preservation of the normal lumbar lordosis. No listhesis. Vertebrae: Vertebral body height maintained without evidence for acute or chronic fracture. Visualized sacrum and pelvis intact. SI joints approximated symmetric. No discrete or worrisome osseous lesions. Paraspinal and other soft tissues: Paraspinous soft tissues demonstrate no acute finding. Moderate aorto bi-iliac atherosclerotic disease. No visible aneurysm. Visualized visceral structures unremarkable. Disc levels: T11-12: Disc desiccation with diffuse disc bulge and intervertebral disc space narrowing. Mild facet  hypertrophy. No canal or foraminal stenosis. T12-L1: Mild facet hypertrophy.  Otherwise negative.  No stenosis. L1-2: Chronic intervertebral disc space narrowing with diffuse disc bulge and disc desiccation. Reactive endplate  changes with marginal endplate osteophytic spurring, most prominent on the left and anteriorly. Mild bilateral facet hypertrophy. Resultant mild left lateral recess narrowing without significant narrowing of the central canal. Mild right with moderate left L1 foraminal stenosis. L2-3: Chronic intervertebral disc space narrowing with diffuse disc bulge and reactive endplate changes. Mild facet hypertrophy. Resultant mild bilateral lateral recess stenosis. Central canal remains patent. Moderate bilateral L2 foraminal narrowing. L3-4: Chronic intervertebral disc space narrowing with diffuse disc bulge. Reactive endplate changes, most prevalent on the right. Mild bilateral facet hypertrophy. Resultant mild spinal stenosis. Moderate to severe right with moderate left L3 foraminal stenosis. L4-5: Chronic intervertebral disc space narrowing with diffuse disc bulge and disc desiccation. Disc bulging asymmetric to the right. Severe right with moderate left facet hypertrophy. Resultant moderate canal with moderate to severe right worse than left lateral recess stenosis. Severe right worse than left L4 foraminal stenosis. L5-S1: Diffuse disc bulge with intervertebral disc space narrowing. Mild facet hypertrophy. No significant canal or lateral recess stenosis. Mild right with moderate left L5 foraminal narrowing. IMPRESSION: 1. No CT evidence for acute osseous abnormality within the lumbar spine. 2. 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 Resultant mild lateral recess narrowing on the left at L1-2 and bilaterally at L2-3, with moderate to severe bilateral lateral recess narrowing at L4-5. 3. Multifactorial degenerative changes with resultant moderate to severe left L1, with  bilateral L2, L3, L4, and left L5 foraminal stenosis. Electronically Signed   By: Jeannine Boga M.D.   On: 12/23/2018 19:30    Procedures Procedures (including critical care time)  Medications Ordered in ED Medications - No data to display   Initial Impression / Assessment and Plan / ED Course  I have reviewed the triage vital signs and the nursing notes.  Pertinent labs & imaging results that were available during my care of the patient were reviewed by me and considered in my medical decision making (see chart for details).       CT scan shows no lumbar compression fractures or any other injuries.  Patient clinically does not seem to have any sciatica or cauda equina.  No pain radiating to the legs.  No weakness to the legs.  Would recommend follow back up to the nursing facility pain control and if symptoms persist consideration for MRI.   Final Clinical Impressions(s) / ED Diagnoses   Final diagnoses:  Acute midline low back pain without sciatica    ED Discharge Orders    None       Fredia Sorrow, MD 12/23/18 2133

## 2018-12-23 NOTE — ED Notes (Signed)
Called [facility] for transport back to [facility], attempted to call report but got no response. Will try again later.

## 2018-12-23 NOTE — Discharge Instructions (Addendum)
CT scan of the lumbar area showed no acute compression fractures or any bony fractures.  Does have a lot of degenerative changes in the area which could explain pain.  Patient on exam here today as no symptoms consistent with sciatica.  Would recommend symptomatic treatment for the back pain.  If things persist MRI of back could be ordered.  But showing no neurological symptoms today.

## 2018-12-23 NOTE — ED Notes (Signed)
[facility] at bedside to transport patient 

## 2019-01-13 ENCOUNTER — Other Ambulatory Visit: Payer: Self-pay

## 2019-01-13 ENCOUNTER — Emergency Department (HOSPITAL_COMMUNITY)
Admission: EM | Admit: 2019-01-13 | Discharge: 2019-01-13 | Disposition: A | Discharge status: Home / Self Care | Payer: Medicare Other | Attending: Emergency Medicine | Admitting: Emergency Medicine

## 2019-01-13 DIAGNOSIS — F0150 Vascular dementia without behavioral disturbance: Secondary | ICD-10-CM | POA: Insufficient documentation

## 2019-01-13 DIAGNOSIS — Z79899 Other long term (current) drug therapy: Secondary | ICD-10-CM | POA: Insufficient documentation

## 2019-01-13 DIAGNOSIS — E119 Type 2 diabetes mellitus without complications: Secondary | ICD-10-CM | POA: Diagnosis not present

## 2019-01-13 DIAGNOSIS — R4182 Altered mental status, unspecified: Secondary | ICD-10-CM | POA: Diagnosis present

## 2019-01-13 DIAGNOSIS — Z7982 Long term (current) use of aspirin: Secondary | ICD-10-CM | POA: Diagnosis not present

## 2019-01-13 DIAGNOSIS — Z794 Long term (current) use of insulin: Secondary | ICD-10-CM | POA: Insufficient documentation

## 2019-01-13 LAB — CBC WITH DIFFERENTIAL/PLATELET
Abs Immature Granulocytes: 0.04 10*3/uL (ref 0.00–0.07)
Basophils Absolute: 0 10*3/uL (ref 0.0–0.1)
Basophils Relative: 0 %
Eosinophils Absolute: 0.1 10*3/uL (ref 0.0–0.5)
Eosinophils Relative: 1 %
HCT: 41.4 % (ref 39.0–52.0)
Hemoglobin: 13.8 g/dL (ref 13.0–17.0)
Immature Granulocytes: 0 %
Lymphocytes Relative: 57 %
Lymphs Abs: 7.6 10*3/uL — ABNORMAL HIGH (ref 0.7–4.0)
MCH: 31.9 pg (ref 26.0–34.0)
MCHC: 33.3 g/dL (ref 30.0–36.0)
MCV: 95.6 fL (ref 80.0–100.0)
Monocytes Absolute: 0.4 10*3/uL (ref 0.1–1.0)
Monocytes Relative: 3 %
Neutro Abs: 5.2 10*3/uL (ref 1.7–7.7)
Neutrophils Relative %: 39 %
Platelets: 184 10*3/uL (ref 150–400)
RBC: 4.33 MIL/uL (ref 4.22–5.81)
RDW: 13.5 % (ref 11.5–15.5)
WBC: 13.4 10*3/uL — ABNORMAL HIGH (ref 4.0–10.5)
nRBC: 0.1 % (ref 0.0–0.2)

## 2019-01-13 LAB — URINALYSIS, ROUTINE W REFLEX MICROSCOPIC
Bilirubin Urine: NEGATIVE
Glucose, UA: 150 mg/dL — AB
Hgb urine dipstick: NEGATIVE
Ketones, ur: NEGATIVE mg/dL
Leukocytes,Ua: NEGATIVE
Nitrite: NEGATIVE
Protein, ur: NEGATIVE mg/dL
Specific Gravity, Urine: 1.016 (ref 1.005–1.030)
pH: 7 (ref 5.0–8.0)

## 2019-01-13 LAB — COMPREHENSIVE METABOLIC PANEL
ALT: 22 U/L (ref 0–44)
AST: 21 U/L (ref 15–41)
Albumin: 4.2 g/dL (ref 3.5–5.0)
Alkaline Phosphatase: 70 U/L (ref 38–126)
Anion gap: 10 (ref 5–15)
BUN: 17 mg/dL (ref 8–23)
CO2: 23 mmol/L (ref 22–32)
Calcium: 9 mg/dL (ref 8.9–10.3)
Chloride: 106 mmol/L (ref 98–111)
Creatinine, Ser: 0.81 mg/dL (ref 0.61–1.24)
GFR calc Af Amer: >60 mL/min (ref >60)
GFR calc non Af Amer: >60 mL/min (ref >60)
Glucose, Bld: 201 mg/dL — ABNORMAL HIGH (ref 70–99)
Potassium: 3.5 mmol/L (ref 3.5–5.1)
Sodium: 139 mmol/L (ref 135–145)
Total Bilirubin: 0.4 mg/dL (ref 0.3–1.2)
Total Protein: 6.3 g/dL — ABNORMAL LOW (ref 6.5–8.1)

## 2019-01-13 NOTE — ED Provider Notes (Signed)
[facility] DEPT Provider Note   CSN: [id number] Arrival date & time: 01/13/19  1704     History   Chief Complaint Chief Complaint  Patient presents with  . Altered Mental Status    hx dementia    HPI Hayden [name] is a 78 y.o. male.     78 year old male with history of dementia presents due to possible change in mental status.  Patient himself has no complaints at this time.  Denies any recent medication changes.  EMS called and patient has been appropriate with them.  Denies any urinary symptoms.     Past Medical History:  Diagnosis Date  . Candidiasis   . Depression   . Diabetes mellitus   . Enlarged prostate   . Fibromyalgia   . GERD (gastroesophageal reflux disease)    reflux intermittent  . Hyperlipidemia   . 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 Leukemia [facility]) chronic lymphocytic   2008 diagnosed-monitored Dr     Patient Active Problem List   Diagnosis Date Noted  . Acute encephalopathy 07/02/2018  . Dementia with behavioral disturbance ([place]) 05/24/2018  . Pressure injury of skin 05/23/2018  . Cellulitis 04/17/2018  . Cellulitis, scrotum 03/09/2018  . Cellulitis of male genitalia 03/08/2018  . Depression 03/08/2018  . BPH (benign prostatic hyperplasia) 03/08/2018  . Leukocytosis 03/08/2018  . Mild dementia ([place]) 10/23/2017  . Lower urinary tract infectious disease 09/06/2017  . Hyperglycemia 09/06/2017  . ARF (acute renal failure) ([place]) 09/06/2017  . Altered mental status 09/06/2017  . Type 2 diabetes mellitus without complication, with long-term current use of insulin ([place]) 04/02/2017  . 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 Bilateral hand pain 12/09/2016  . 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 ADENOIDECTOMY    . CARPAL TUNNEL RELEASE     rt hand  . CATARACT EXTRACTION, BILATERAL    . CERVICAL FUSION  1998  . CYSTOSCOPY  07/26/2012   Procedure: CYSTOSCOPY;  Surgeon: Ailene Rud, MD;  Location[facility];  Service: Urology;  Laterality: N/A;  DIAGNOSTIC CYSTO PROSTATE ULTRASOUND      . PROSTATE BIOPSY  07/26/2012   Procedure: PROSTATE BIOPSY;  Surgeon: Ailene Rud, MD;  Location: [facility];  Service: Urology;  Laterality: N/A;  . retinal micro aneurysms    . tonsil    . VASECTOMY    . VITRECTOMY          Home Medications    Prior to Admission medications   Medication Sig Start Date End Date Taking? Authorizing Provider  acetaminophen (TYLENOL) 500 MG tablet Take 500 mg by mouth every 4 (four) hours as needed for fever or headache.    [provider]  alum & mag hydroxide-simeth (MINTOX REGULAR STRENGTH) 200-200-20 MG/5ML suspension Take 30 mLs by mouth every 6 (six) hours as needed for indigestion or heartburn.    [provider]  aspirin 81 MG tablet Take 81 mg by mouth every morning.     [provider]  carbamazepine (TEGRETOL) 200 MG tablet Take 100 mg by mouth 2 (two) times daily.    [provider]  cetirizine (ZYRTEC) 10 MG tablet Take 10 mg by mouth daily as needed for allergies.     [provider]  Cholecalciferol (VITAMIN D3) 1000 UNITS CAPS Take  1,000 Units by mouth daily after breakfast.     [provider]  DIMETHICONE-ZINC OXIDE EX Apply 1 application topically See admin instructions. Apply topically to buttocks and scrotum after each incontinence episode    [provider]  docusate sodium (COLACE) 100 MG capsule Take 100 mg by mouth daily.     [provider]  donepezil (ARICEPT) 10 MG tablet Take 1 tablet (10 mg total) by mouth at bedtime. 05/03/18   Cameron Sprang, MD  EPINEPHrine (EPIPEN JR) 0.15 MG/0.3ML injection Inject 0.15 mg into the muscle as needed for anaphylaxis.     [provider]  fish oil-omega-3 fatty acids 1000 MG capsule Take 1 g by mouth 2 (two) times daily.     [provider]  guaiFENesin (ROBITUSSIN) 100 MG/5ML SOLN Take 10 mLs by mouth every 6 (six) hours as needed for cough or to loosen phlegm.    [provider]  HYDROcodone-acetaminophen (NORCO/VICODIN) 5-325 MG tablet Take 1 tablet by mouth every 6 (six) hours as needed for moderate pain.    [provider]  LEVEMIR FLEXTOUCH 100 UNIT/ML Pen Inject 36 Units into the skin at bedtime.  07/16/15   [provider]  loperamide (IMODIUM) 2 MG capsule Take 2 mg by mouth as needed for diarrhea or loose stools.    [provider]  LORazepam (ATIVAN) 0.5 MG tablet Take 0.5 mg by mouth 2 (two) times daily.    [provider]  lovastatin (MEVACOR) 20 MG tablet Take 20 mg by mouth daily.    [provider]  magnesium hydroxide (MILK OF MAGNESIA) 400 MG/5ML suspension Take 30 mLs by mouth daily as needed for mild constipation.    [provider]  neomycin-bacitracin-polymyxin (NEOSPORIN) OINT Apply 1 application topically as needed for wound care (left shoulder).    [provider]  nystatin cream (MYCOSTATIN) Apply to affected area 2 times daily Patient taking differently: Apply 1 application topically 2 (two) times daily.  04/23/18   [name], PA-C  risperiDONE (RISPERDAL) 0.5 MG tablet Take 0.5-1 mg by mouth See admin instructions. 0.5 mg by mouth every morning and take 1 mg by mouth at bedtime.    [provider]  sulfamethoxazole-trimethoprim (BACTRIM DS,SEPTRA DS) 800-160 MG tablet Take 1 tablet by mouth 2 (two) times daily. 07/05/18   Georgette Shell, MD  tamsulosin (FLOMAX) 0.4 MG CAPS capsule Take 0.4 mg by mouth every evening.     [provider]  tolnaftate (TINACTIN) 1 % cream Apply 1 application topically 2 (two) times daily. 05/27/18   Isla Pence, MD  traZODone (DESYREL) 100 MG  tablet Take 150 mg by mouth at bedtime.    [provider]  vitamin B-12 (CYANOCOBALAMIN) 1000 MCG tablet Take 1,000 mcg by mouth every morning.     [provider]  calcium citrate-vitamin D (CITRACAL+D) 315-200 MG-UNIT per tablet Take 1 tablet by mouth daily.  09/04/11  [provider]  terazosin (HYTRIN) 5 MG capsule Take 5 mg by mouth at bedtime.  09/04/11  [provider]    Family History Family History  Problem Relation Age of Onset  . Diabetes Father   . Diabetes Sister   . Diabetes Brother     Social History Social History   Tobacco Use  . Smoking status: Never Smoker  . Smokeless tobacco: Never Used  Substance Use Topics  . Alcohol use: No    Frequency: Never  . Drug use: No  Allergies   Bee venom, Glucophage [metformin], Shellfish allergy, Grass extracts [gramineae pollens], and Tobacco [nicotiana tabacum]   Review of Systems Review of Systems  All other systems reviewed and are negative.    Physical Exam Updated Vital Signs BP (!) 153/61 (BP Location: Left Arm)   Pulse 75   Temp 99 F (37.2 C) (Oral)   Resp 18   Ht 1.727 m (5\' 8" )   Wt 68 kg   SpO2 100%   BMI 22.81 kg/m   Physical Exam Vitals signs and nursing note reviewed.  Constitutional:      General: He is not in acute distress.    Appearance: Normal appearance. He is well-developed. He is not toxic-appearing.  HENT:     Head: Normocephalic and atraumatic.  Eyes:     General: Lids are normal.     Conjunctiva/sclera: Conjunctivae normal.     Pupils: Pupils are equal, round, and reactive to light.  Neck:     Musculoskeletal: Normal range of motion and neck supple.     Thyroid: No thyroid mass.     Trachea: No tracheal deviation.  Cardiovascular:     Rate and Rhythm: Normal rate and regular rhythm.     Heart sounds: Normal heart sounds. No murmur. No gallop.   Pulmonary:     Effort: Pulmonary effort is normal. No respiratory distress.     Breath  sounds: Normal breath sounds. No stridor. No decreased breath sounds, wheezing, rhonchi or rales.  Abdominal:     General: Bowel sounds are normal. There is no distension.     Palpations: Abdomen is soft.     Tenderness: There is no abdominal tenderness. There is no rebound.  Musculoskeletal: Normal range of motion.        General: No tenderness.  Skin:    General: Skin is warm and dry.     Findings: No abrasion or rash.  Neurological:     Mental Status: He is alert and oriented to person, place, and time.     GCS: GCS eye subscore is 4. GCS verbal subscore is 5. GCS motor subscore is 6.     Cranial Nerves: No cranial nerve deficit.     Sensory: No sensory deficit.  Psychiatric:        Speech: Speech normal.        Behavior: Behavior normal.      ED Treatments / Results  Labs (all labs ordered are listed, but only abnormal results are displayed) Labs Reviewed  CBC WITH DIFFERENTIAL/PLATELET  COMPREHENSIVE METABOLIC PANEL  URINALYSIS, ROUTINE W REFLEX MICROSCOPIC    EKG None  Radiology No results found.  Procedures Procedures (including critical care time)  Medications Ordered in ED Medications - No data to display   Initial Impression / Assessment and Plan / ED Course  I have reviewed the triage vital signs and the nursing notes.  Pertinent labs & imaging results that were available during my care of the patient were reviewed by me and considered in my medical decision making (see chart for details).        Labs here without acute abnls Pt seems appropriate Will d/c  Final Clinical Impressions(s) / ED Diagnoses   Final diagnoses:  None    ED Discharge Orders    None       Lacretia Leigh, MD 01/13/19 2004

## 2019-01-13 NOTE — ED Notes (Signed)
Pt ambulated to bathroom with steady gait. Standby assistance from staff for safety.

## 2019-01-13 NOTE — ED Triage Notes (Signed)
Pt to ED via EMS from [facility]. DNR papers at bedside. Pt has hx dementia. Per facility "Pt is not acting his normal self and increasingly agitated." Per EMS pt behavior is appropriate A&O x 2.  Pt not voicing any complaints. Hx DM.

## 2019-01-13 NOTE — ED Notes (Signed)
[facility] called for transport.  

## 2019-01-13 NOTE — ED Notes (Signed)
Called to check status of [facility] and when pt will be transported.

## 2019-01-13 NOTE — ED Notes (Addendum)
[place] to discuss why pt was brought to the ED. Additionally, notified facility of pt returning to facility.

## 2019-02-10 ENCOUNTER — Inpatient Hospital Stay (HOSPITAL_COMMUNITY): Payer: Medicare Other

## 2019-02-10 ENCOUNTER — Emergency Department (HOSPITAL_COMMUNITY): Payer: Medicare Other

## 2019-02-10 ENCOUNTER — Encounter (HOSPITAL_COMMUNITY)
Admission: EM | Disposition: A | Discharge status: Skilled Nursing Facility | Payer: Self-pay | Source: Skilled Nursing Facility | Attending: Internal Medicine

## 2019-02-10 ENCOUNTER — Encounter (HOSPITAL_COMMUNITY): Payer: Self-pay | Admitting: Emergency Medicine

## 2019-02-10 ENCOUNTER — Inpatient Hospital Stay (HOSPITAL_COMMUNITY)
Admission: EM | Admit: 2019-02-10 | Discharge: 2019-02-16 | DRG: 481 | Disposition: A | Discharge status: Skilled Nursing Facility | Payer: Medicare Other | Source: Skilled Nursing Facility | Attending: Internal Medicine | Admitting: Internal Medicine

## 2019-02-10 ENCOUNTER — Emergency Department (HOSPITAL_COMMUNITY): Payer: Medicare Other | Admitting: Anesthesiology

## 2019-02-10 DIAGNOSIS — M25551 Pain in right hip: Secondary | ICD-10-CM | POA: Diagnosis present

## 2019-02-10 DIAGNOSIS — Z79899 Other long term (current) drug therapy: Secondary | ICD-10-CM | POA: Diagnosis not present

## 2019-02-10 DIAGNOSIS — C9110 Chronic lymphocytic leukemia of B-cell type not having achieved remission: Secondary | ICD-10-CM | POA: Diagnosis present

## 2019-02-10 DIAGNOSIS — S7290XA Unspecified fracture of unspecified femur, initial encounter for closed fracture: Secondary | ICD-10-CM

## 2019-02-10 DIAGNOSIS — Z79891 Long term (current) use of opiate analgesic: Secondary | ICD-10-CM

## 2019-02-10 DIAGNOSIS — S72001A Fracture of unspecified part of neck of right femur, initial encounter for closed fracture: Secondary | ICD-10-CM

## 2019-02-10 DIAGNOSIS — Z66 Do not resuscitate: Secondary | ICD-10-CM | POA: Diagnosis present

## 2019-02-10 DIAGNOSIS — W19XXXA Unspecified fall, initial encounter: Secondary | ICD-10-CM

## 2019-02-10 DIAGNOSIS — Z20828 Contact with and (suspected) exposure to other viral communicable diseases: Secondary | ICD-10-CM | POA: Diagnosis present

## 2019-02-10 DIAGNOSIS — W1830XA Fall on same level, unspecified, initial encounter: Secondary | ICD-10-CM | POA: Diagnosis present

## 2019-02-10 DIAGNOSIS — D62 Acute posthemorrhagic anemia: Secondary | ICD-10-CM | POA: Diagnosis not present

## 2019-02-10 DIAGNOSIS — Z833 Family history of diabetes mellitus: Secondary | ICD-10-CM | POA: Diagnosis not present

## 2019-02-10 DIAGNOSIS — Z91030 Bee allergy status: Secondary | ICD-10-CM | POA: Diagnosis not present

## 2019-02-10 DIAGNOSIS — Z09 Encounter for follow-up examination after completed treatment for conditions other than malignant neoplasm: Secondary | ICD-10-CM

## 2019-02-10 DIAGNOSIS — S7291XA Unspecified fracture of right femur, initial encounter for closed fracture: Secondary | ICD-10-CM | POA: Diagnosis not present

## 2019-02-10 DIAGNOSIS — Z794 Long term (current) use of insulin: Secondary | ICD-10-CM

## 2019-02-10 DIAGNOSIS — Z981 Arthrodesis status: Secondary | ICD-10-CM

## 2019-02-10 DIAGNOSIS — F329 Major depressive disorder, single episode, unspecified: Secondary | ICD-10-CM | POA: Diagnosis present

## 2019-02-10 DIAGNOSIS — M810 Age-related osteoporosis without current pathological fracture: Secondary | ICD-10-CM | POA: Diagnosis present

## 2019-02-10 DIAGNOSIS — E876 Hypokalemia: Secondary | ICD-10-CM | POA: Diagnosis not present

## 2019-02-10 DIAGNOSIS — N400 Enlarged prostate without lower urinary tract symptoms: Secondary | ICD-10-CM | POA: Diagnosis present

## 2019-02-10 DIAGNOSIS — K219 Gastro-esophageal reflux disease without esophagitis: Secondary | ICD-10-CM | POA: Diagnosis present

## 2019-02-10 DIAGNOSIS — Z888 Allergy status to other drugs, medicaments and biological substances status: Secondary | ICD-10-CM | POA: Diagnosis not present

## 2019-02-10 DIAGNOSIS — M797 Fibromyalgia: Secondary | ICD-10-CM | POA: Diagnosis present

## 2019-02-10 DIAGNOSIS — Z7982 Long term (current) use of aspirin: Secondary | ICD-10-CM

## 2019-02-10 DIAGNOSIS — Z91013 Allergy to seafood: Secondary | ICD-10-CM

## 2019-02-10 DIAGNOSIS — E785 Hyperlipidemia, unspecified: Secondary | ICD-10-CM | POA: Diagnosis present

## 2019-02-10 DIAGNOSIS — E119 Type 2 diabetes mellitus without complications: Secondary | ICD-10-CM

## 2019-02-10 DIAGNOSIS — D72829 Elevated white blood cell count, unspecified: Secondary | ICD-10-CM | POA: Diagnosis not present

## 2019-02-10 DIAGNOSIS — S72141A Displaced intertrochanteric fracture of right femur, initial encounter for closed fracture: Principal | ICD-10-CM | POA: Diagnosis present

## 2019-02-10 DIAGNOSIS — Z856 Personal history of leukemia: Secondary | ICD-10-CM | POA: Diagnosis not present

## 2019-02-10 DIAGNOSIS — F32A Depression, unspecified: Secondary | ICD-10-CM | POA: Diagnosis present

## 2019-02-10 DIAGNOSIS — F0390 Unspecified dementia without behavioral disturbance: Secondary | ICD-10-CM | POA: Diagnosis present

## 2019-02-10 HISTORY — PX: FEMUR IM NAIL: SHX1597

## 2019-02-10 LAB — COMPREHENSIVE METABOLIC PANEL
ALT: 21 U/L (ref 0–44)
AST: 25 U/L (ref 15–41)
Albumin: 4.5 g/dL (ref 3.5–5.0)
Alkaline Phosphatase: 75 U/L (ref 38–126)
Anion gap: 12 (ref 5–15)
BUN: 17 mg/dL (ref 8–23)
CO2: 24 mmol/L (ref 22–32)
Calcium: 9.5 mg/dL (ref 8.9–10.3)
Chloride: 104 mmol/L (ref 98–111)
Creatinine, Ser: 0.82 mg/dL (ref 0.61–1.24)
GFR calc Af Amer: >60 mL/min (ref >60)
GFR calc non Af Amer: >60 mL/min (ref >60)
Glucose, Bld: 98 mg/dL (ref 70–99)
Potassium: 3.8 mmol/L (ref 3.5–5.1)
Sodium: 140 mmol/L (ref 135–145)
Total Bilirubin: 0.9 mg/dL (ref 0.3–1.2)
Total Protein: 7 g/dL (ref 6.5–8.1)

## 2019-02-10 LAB — CREATININE, SERUM
Creatinine, Ser: 0.9 mg/dL (ref 0.61–1.24)
GFR calc Af Amer: >60 mL/min (ref >60)
GFR calc non Af Amer: >60 mL/min (ref >60)

## 2019-02-10 LAB — TYPE AND SCREEN
ABO/RH(D): A POS
Antibody Screen: NEGATIVE

## 2019-02-10 LAB — GLUCOSE, CAPILLARY
Glucose-Capillary: 169 mg/dL — ABNORMAL HIGH (ref 70–99)
Glucose-Capillary: 168 mg/dL — ABNORMAL HIGH (ref 70–99)
Glucose-Capillary: 205 mg/dL — ABNORMAL HIGH (ref 70–99)

## 2019-02-10 LAB — CBC
HCT: 37 % — ABNORMAL LOW (ref 39.0–52.0)
Hemoglobin: 12.6 g/dL — ABNORMAL LOW (ref 13.0–17.0)
MCH: 32.4 pg (ref 26.0–34.0)
MCHC: 34.1 g/dL (ref 30.0–36.0)
MCV: 95.1 fL (ref 80.0–100.0)
Platelets: 206 10*3/uL (ref 150–400)
RBC: 3.89 MIL/uL — ABNORMAL LOW (ref 4.22–5.81)
RDW: 12.8 % (ref 11.5–15.5)
WBC: 19.5 10*3/uL — ABNORMAL HIGH (ref 4.0–10.5)
nRBC: 0 % (ref 0.0–0.2)

## 2019-02-10 LAB — CBC WITH DIFFERENTIAL/PLATELET
Abs Immature Granulocytes: 0.07 10*3/uL (ref 0.00–0.07)
Basophils Absolute: 0.1 10*3/uL (ref 0.0–0.1)
Basophils Relative: 0 %
Eosinophils Absolute: 0.1 10*3/uL (ref 0.0–0.5)
Eosinophils Relative: 1 %
HCT: 40.6 % (ref 39.0–52.0)
Hemoglobin: 14 g/dL (ref 13.0–17.0)
Immature Granulocytes: 0 %
Lymphocytes Relative: 52 %
Lymphs Abs: 10 10*3/uL — ABNORMAL HIGH (ref 0.7–4.0)
MCH: 32.6 pg (ref 26.0–34.0)
MCHC: 34.5 g/dL (ref 30.0–36.0)
MCV: 94.4 fL (ref 80.0–100.0)
Monocytes Absolute: 0.5 10*3/uL (ref 0.1–1.0)
Monocytes Relative: 3 %
Neutro Abs: 8.4 10*3/uL — ABNORMAL HIGH (ref 1.7–7.7)
Neutrophils Relative %: 44 %
Platelets: 200 10*3/uL (ref 150–400)
RBC: 4.3 MIL/uL (ref 4.22–5.81)
RDW: 13 % (ref 11.5–15.5)
WBC: 19.1 10*3/uL — ABNORMAL HIGH (ref 4.0–10.5)
nRBC: 0 % (ref 0.0–0.2)

## 2019-02-10 LAB — SARS CORONAVIRUS 2 BY RT PCR (HOSPITAL ORDER, PERFORMED IN ~~LOC~~ HOSPITAL LAB): SARS Coronavirus 2: NEGATIVE

## 2019-02-10 LAB — PATHOLOGIST SMEAR REVIEW

## 2019-02-10 LAB — PROTIME-INR
INR: 1 (ref 0.8–1.2)
Prothrombin Time: 13.2 seconds (ref 11.4–15.2)

## 2019-02-10 LAB — ABO/RH: ABO/RH(D): A POS

## 2019-02-10 SURGERY — INSERTION, INTRAMEDULLARY ROD, FEMUR
Anesthesia: General | Site: Hip | Laterality: Right

## 2019-02-10 MED ORDER — FENTANYL CITRATE (PF) 100 MCG/2ML IJ SOLN
INTRAMUSCULAR | Status: AC
  Filled 2019-02-10: qty 2

## 2019-02-10 MED ORDER — PROPOFOL 10 MG/ML IV BOLUS
INTRAVENOUS | Status: AC
  Filled 2019-02-10: qty 20

## 2019-02-10 MED ORDER — PHENYLEPHRINE 40 MCG/ML (10ML) SYRINGE FOR IV PUSH (FOR BLOOD PRESSURE SUPPORT)
PREFILLED_SYRINGE | INTRAVENOUS | Status: DC | PRN
  Administered 2019-02-10: 80 ug via INTRAVENOUS
  Administered 2019-02-10: 100 ug via INTRAVENOUS

## 2019-02-10 MED ORDER — ROCURONIUM BROMIDE 10 MG/ML (PF) SYRINGE
PREFILLED_SYRINGE | INTRAVENOUS | Status: DC | PRN
  Administered 2019-02-10: 10 mg via INTRAVENOUS
  Administered 2019-02-10: 30 mg via INTRAVENOUS

## 2019-02-10 MED ORDER — ONDANSETRON HCL 4 MG/2ML IJ SOLN: 4.0000 mg | Freq: Four times a day (QID) | INTRAMUSCULAR | Status: DC | PRN

## 2019-02-10 MED ORDER — HYDROMORPHONE HCL 1 MG/ML IJ SOLN
0.5000 mg | INTRAMUSCULAR | Status: DC | PRN
  Administered 2019-02-10: 0.5 mg via INTRAVENOUS
  Filled 2019-02-10: qty 0.5

## 2019-02-10 MED ORDER — OXYCODONE HCL 5 MG PO TABS
2.5000 mg | ORAL_TABLET | ORAL | Status: DC | PRN
  Administered 2019-02-11 – 2019-02-16 (×4): 5 mg via ORAL
  Filled 2019-02-10 (×4): qty 1

## 2019-02-10 MED ORDER — CEFAZOLIN SODIUM-DEXTROSE 2-4 GM/100ML-% IV SOLN
2.0000 g | INTRAVENOUS | Status: AC
  Administered 2019-02-10: 2 g via INTRAVENOUS
  Filled 2019-02-10: qty 100

## 2019-02-10 MED ORDER — DONEPEZIL HCL 10 MG PO TABS
10.0000 mg | ORAL_TABLET | Freq: Every day | ORAL | Status: DC
  Administered 2019-02-10 – 2019-02-15 (×6): 10 mg via ORAL
  Filled 2019-02-10 (×6): qty 1

## 2019-02-10 MED ORDER — OXYCODONE HCL 5 MG PO TABS: 5.0000 mg | ORAL_TABLET | ORAL | Status: DC | PRN

## 2019-02-10 MED ORDER — MORPHINE SULFATE (PF) 4 MG/ML IV SOLN
1.0000 mg | INTRAVENOUS | Status: DC | PRN
  Administered 2019-02-10 (×2): 1 mg via INTRAVENOUS

## 2019-02-10 MED ORDER — DOCUSATE SODIUM 100 MG PO CAPS
100.0000 mg | ORAL_CAPSULE | Freq: Two times a day (BID) | ORAL | Status: DC
  Administered 2019-02-10 – 2019-02-16 (×10): 100 mg via ORAL
  Filled 2019-02-10 (×11): qty 1

## 2019-02-10 MED ORDER — DOCUSATE SODIUM 100 MG PO CAPS: 100.0000 mg | ORAL_CAPSULE | Freq: Every day | ORAL | Status: DC

## 2019-02-10 MED ORDER — TAMSULOSIN HCL 0.4 MG PO CAPS
0.4000 mg | ORAL_CAPSULE | Freq: Every evening | ORAL | Status: DC
  Administered 2019-02-11 – 2019-02-16 (×5): 0.4 mg via ORAL
  Filled 2019-02-10 (×6): qty 1

## 2019-02-10 MED ORDER — PHENYLEPHRINE 40 MCG/ML (10ML) SYRINGE FOR IV PUSH (FOR BLOOD PRESSURE SUPPORT)
PREFILLED_SYRINGE | INTRAVENOUS | Status: AC
  Filled 2019-02-10: qty 20

## 2019-02-10 MED ORDER — PHENOL 1.4 % MT LIQD: 1.0000 | OROMUCOSAL | Status: DC | PRN

## 2019-02-10 MED ORDER — LACTATED RINGERS IV SOLN
INTRAVENOUS | Status: DC
  Administered 2019-02-11 (×2): via INTRAVENOUS

## 2019-02-10 MED ORDER — ACETAMINOPHEN 500 MG PO TABS
1000.0000 mg | ORAL_TABLET | Freq: Three times a day (TID) | ORAL | Status: AC
  Administered 2019-02-10 – 2019-02-14 (×10): 1000 mg via ORAL
  Filled 2019-02-10 (×11): qty 2

## 2019-02-10 MED ORDER — PRAVASTATIN SODIUM 20 MG PO TABS
20.0000 mg | ORAL_TABLET | Freq: Every day | ORAL | Status: DC
  Administered 2019-02-11 – 2019-02-16 (×5): 20 mg via ORAL
  Filled 2019-02-10 (×6): qty 1

## 2019-02-10 MED ORDER — MORPHINE SULFATE (PF) 2 MG/ML IV SOLN
1.0000 mg | INTRAVENOUS | Status: DC | PRN
  Administered 2019-02-10: 1 mg via INTRAVENOUS
  Filled 2019-02-10: qty 1

## 2019-02-10 MED ORDER — VANCOMYCIN HCL 1000 MG IV SOLR
INTRAVENOUS | Status: DC | PRN
  Administered 2019-02-10: 1000 mg via TOPICAL

## 2019-02-10 MED ORDER — LACTATED RINGERS IV SOLN
INTRAVENOUS | Status: DC
  Administered 2019-02-10: 16:00:00 via INTRAVENOUS

## 2019-02-10 MED ORDER — ENOXAPARIN SODIUM 40 MG/0.4ML ~~LOC~~ SOLN
40.0000 mg | SUBCUTANEOUS | Status: DC
  Administered 2019-02-11 – 2019-02-16 (×6): 40 mg via SUBCUTANEOUS
  Filled 2019-02-10 (×6): qty 0.4

## 2019-02-10 MED ORDER — PROPOFOL 10 MG/ML IV BOLUS
INTRAVENOUS | Status: DC | PRN
  Administered 2019-02-10: 70 mg via INTRAVENOUS

## 2019-02-10 MED ORDER — METOCLOPRAMIDE HCL 5 MG PO TABS: 5.0000 mg | ORAL_TABLET | Freq: Three times a day (TID) | ORAL | Status: DC | PRN

## 2019-02-10 MED ORDER — ONDANSETRON HCL 4 MG/2ML IJ SOLN
INTRAMUSCULAR | Status: AC
  Filled 2019-02-10: qty 2

## 2019-02-10 MED ORDER — METOCLOPRAMIDE HCL 5 MG/ML IJ SOLN: 5.0000 mg | Freq: Three times a day (TID) | INTRAMUSCULAR | Status: DC | PRN

## 2019-02-10 MED ORDER — VANCOMYCIN HCL 1000 MG IV SOLR
INTRAVENOUS | Status: AC
  Filled 2019-02-10: qty 1000

## 2019-02-10 MED ORDER — ASPIRIN 81 MG PO CHEW
81.0000 mg | CHEWABLE_TABLET | Freq: Every day | ORAL | Status: DC
  Administered 2019-02-11 – 2019-02-16 (×6): 81 mg via ORAL
  Filled 2019-02-10 (×6): qty 1

## 2019-02-10 MED ORDER — SUGAMMADEX SODIUM 200 MG/2ML IV SOLN
INTRAVENOUS | Status: DC | PRN
  Administered 2019-02-10: 160 mg via INTRAVENOUS

## 2019-02-10 MED ORDER — FENTANYL CITRATE (PF) 100 MCG/2ML IJ SOLN
50.0000 ug | INTRAMUSCULAR | Status: AC | PRN
  Administered 2019-02-10 (×2): 50 ug via INTRAVENOUS
  Filled 2019-02-10 (×2): qty 2

## 2019-02-10 MED ORDER — MORPHINE SULFATE (PF) 4 MG/ML IV SOLN
INTRAVENOUS | Status: AC
  Filled 2019-02-10: qty 1

## 2019-02-10 MED ORDER — LIDOCAINE 2% (20 MG/ML) 5 ML SYRINGE
INTRAMUSCULAR | Status: DC | PRN
  Administered 2019-02-10: 50 mg via INTRAVENOUS

## 2019-02-10 MED ORDER — ONDANSETRON HCL 4 MG PO TABS: 4.0000 mg | ORAL_TABLET | Freq: Four times a day (QID) | ORAL | Status: DC | PRN

## 2019-02-10 MED ORDER — SUCCINYLCHOLINE CHLORIDE 200 MG/10ML IV SOSY
PREFILLED_SYRINGE | INTRAVENOUS | Status: DC | PRN
  Administered 2019-02-10: 80 mg via INTRAVENOUS

## 2019-02-10 MED ORDER — CELECOXIB 200 MG PO CAPS
200.0000 mg | ORAL_CAPSULE | Freq: Two times a day (BID) | ORAL | Status: DC
  Administered 2019-02-10 – 2019-02-16 (×12): 200 mg via ORAL
  Filled 2019-02-10 (×12): qty 1

## 2019-02-10 MED ORDER — 0.9 % SODIUM CHLORIDE (POUR BTL) OPTIME
TOPICAL | Status: DC | PRN
  Administered 2019-02-10: 1000 mL

## 2019-02-10 MED ORDER — RISPERIDONE 1 MG PO TABS
1.0000 mg | ORAL_TABLET | Freq: Two times a day (BID) | ORAL | Status: DC
  Administered 2019-02-10 – 2019-02-16 (×12): 1 mg via ORAL
  Filled 2019-02-10 (×12): qty 1

## 2019-02-10 MED ORDER — TRAZODONE HCL 100 MG PO TABS
200.0000 mg | ORAL_TABLET | Freq: Every day | ORAL | Status: DC
  Administered 2019-02-10 – 2019-02-15 (×6): 200 mg via ORAL
  Filled 2019-02-10 (×6): qty 2

## 2019-02-10 MED ORDER — CARBAMAZEPINE 200 MG PO TABS: 200.0000 mg | ORAL_TABLET | ORAL | Status: DC

## 2019-02-10 MED ORDER — PROMETHAZINE HCL 25 MG/ML IJ SOLN: 6.2500 mg | INTRAMUSCULAR | Status: DC | PRN

## 2019-02-10 MED ORDER — ONDANSETRON HCL 4 MG/2ML IJ SOLN
INTRAMUSCULAR | Status: DC | PRN
  Administered 2019-02-10: 4 mg via INTRAVENOUS

## 2019-02-10 MED ORDER — FENTANYL CITRATE (PF) 100 MCG/2ML IJ SOLN
INTRAMUSCULAR | Status: DC | PRN
  Administered 2019-02-10: 50 ug via INTRAVENOUS
  Administered 2019-02-10 (×2): 25 ug via INTRAVENOUS

## 2019-02-10 MED ORDER — CEFAZOLIN SODIUM-DEXTROSE 2-4 GM/100ML-% IV SOLN
2.0000 g | Freq: Four times a day (QID) | INTRAVENOUS | Status: AC
  Administered 2019-02-10 – 2019-02-11 (×2): 2 g via INTRAVENOUS
  Filled 2019-02-10 (×2): qty 100

## 2019-02-10 MED ORDER — INSULIN DETEMIR 100 UNIT/ML ~~LOC~~ SOLN
15.0000 [IU] | Freq: Every day | SUBCUTANEOUS | Status: DC
  Administered 2019-02-10 – 2019-02-11 (×2): 15 [IU] via SUBCUTANEOUS
  Filled 2019-02-10 (×2): qty 0.15

## 2019-02-10 MED ORDER — ACETAMINOPHEN 500 MG PO TABS: 1000.0000 mg | ORAL_TABLET | Freq: Three times a day (TID) | ORAL | Status: DC

## 2019-02-10 MED ORDER — CARBAMAZEPINE 200 MG PO TABS
200.0000 mg | ORAL_TABLET | Freq: Every day | ORAL | Status: DC
  Administered 2019-02-11 – 2019-02-16 (×6): 200 mg via ORAL
  Filled 2019-02-10 (×6): qty 1

## 2019-02-10 MED ORDER — ROCURONIUM BROMIDE 10 MG/ML (PF) SYRINGE
PREFILLED_SYRINGE | INTRAVENOUS | Status: AC
  Filled 2019-02-10: qty 10

## 2019-02-10 MED ORDER — CHLORHEXIDINE GLUCONATE 4 % EX LIQD: 60.0000 mL | Freq: Once | CUTANEOUS | Status: DC

## 2019-02-10 MED ORDER — MENTHOL 3 MG MT LOZG: 1.0000 | LOZENGE | OROMUCOSAL | Status: DC | PRN

## 2019-02-10 MED ORDER — LORAZEPAM 1 MG PO TABS
1.0000 mg | ORAL_TABLET | Freq: Three times a day (TID) | ORAL | Status: DC | PRN
  Administered 2019-02-10 – 2019-02-16 (×4): 1 mg via ORAL
  Filled 2019-02-10 (×4): qty 1

## 2019-02-10 MED ORDER — CARBAMAZEPINE 200 MG PO TABS
300.0000 mg | ORAL_TABLET | Freq: Every day | ORAL | Status: DC
  Administered 2019-02-11 – 2019-02-15 (×5): 300 mg via ORAL
  Filled 2019-02-10 (×6): qty 1.5

## 2019-02-10 MED ORDER — SUCCINYLCHOLINE CHLORIDE 200 MG/10ML IV SOSY
PREFILLED_SYRINGE | INTRAVENOUS | Status: AC
  Filled 2019-02-10: qty 10

## 2019-02-10 MED ORDER — OXYCODONE HCL 5 MG PO TABS: 2.5000 mg | ORAL_TABLET | ORAL | Status: DC | PRN

## 2019-02-10 SURGICAL SUPPLY — 39 items
BAG ZIPLOCK 12X15 (MISCELLANEOUS) IMPLANT
BIT DRILL CANN LG 4.3MM (BIT) ×1 IMPLANT
BNDG GAUZE ELAST 4 BULKY (GAUZE/BANDAGES/DRESSINGS) ×3 IMPLANT
CLOSURE WOUND 1/2 X4 (GAUZE/BANDAGES/DRESSINGS) ×1
COVER PERINEAL POST (MISCELLANEOUS) ×3 IMPLANT
COVER SURGICAL LIGHT HANDLE (MISCELLANEOUS) ×3 IMPLANT
COVER WAND RF STERILE (DRAPES) IMPLANT
DRAPE STERI IOBAN 125X83 (DRAPES) IMPLANT
DRILL BIT CANN LG 4.3MM (BIT) ×3
DRSG AQUACEL AG ADV 3.5X 4 (GAUZE/BANDAGES/DRESSINGS) ×9 IMPLANT
DRSG AQUACEL AG ADV 3.5X 6 (GAUZE/BANDAGES/DRESSINGS) IMPLANT
DURAPREP 26ML APPLICATOR (WOUND CARE) ×3 IMPLANT
ELECT REM PT RETURN 15FT ADLT (MISCELLANEOUS) ×3 IMPLANT
GLOVE BIOGEL PI IND STRL 8.5 (GLOVE) ×1 IMPLANT
GLOVE BIOGEL PI INDICATOR 8.5 (GLOVE) ×2
GLOVE ECLIPSE 8.0 STRL XLNG CF (GLOVE) ×3 IMPLANT
GLOVE ORTHO TXT STRL SZ7.5 (GLOVE) ×3 IMPLANT
GOWN STRL REUS W/TWL LRG LVL3 (GOWN DISPOSABLE) ×3 IMPLANT
GOWN STRL REUS W/TWL XL LVL3 (GOWN DISPOSABLE) ×3 IMPLANT
GUIDEPIN 3.2X17.5 THRD DISP (PIN) ×3 IMPLANT
GUIDEWIRE BALL NOSE 80CM (WIRE) ×3 IMPLANT
HIP FRAC NAIL LAG SCR 10.5X100 (Orthopedic Implant) ×2 IMPLANT
KIT BASIN OR (CUSTOM PROCEDURE TRAY) ×3 IMPLANT
KIT TURNOVER KIT A (KITS) IMPLANT
MANIFOLD NEPTUNE II (INSTRUMENTS) ×3 IMPLANT
NAIL HIP FRACT 130D 9X180 (Orthopedic Implant) ×3 IMPLANT
PACK GENERAL/GYN (CUSTOM PROCEDURE TRAY) ×3 IMPLANT
PROTECTOR NERVE ULNAR (MISCELLANEOUS) ×3 IMPLANT
SCREW BONE CORTICAL 5.0X38 (Screw) ×3 IMPLANT
SCREW CANN THRD AFF 10.5X100 (Orthopedic Implant) ×1 IMPLANT
STRIP CLOSURE SKIN 1/2X4 (GAUZE/BANDAGES/DRESSINGS) ×2 IMPLANT
SUT MNCRL AB 4-0 PS2 18 (SUTURE) ×3 IMPLANT
SUT VIC AB 0 CT1 36 (SUTURE) ×3 IMPLANT
SUT VIC AB 1 CT1 27 (SUTURE) ×2
SUT VIC AB 1 CT1 27XBRD ANTBC (SUTURE) ×1 IMPLANT
SUT VIC AB 2-0 CT1 27 (SUTURE) ×2
SUT VIC AB 2-0 CT1 TAPERPNT 27 (SUTURE) ×1 IMPLANT
SUT VIC AB 3-0 CT1 27 (SUTURE) ×2
SUT VIC AB 3-0 CT1 TAPERPNT 27 (SUTURE) ×1 IMPLANT

## 2019-02-10 NOTE — Anesthesia Postprocedure Evaluation (Signed)
Anesthesia Post Note  Patient: Hayden [name]  Procedure(s) Performed: INTRAMEDULLARY (IM) NAIL FEMORAL (Right Hip)     Patient location during evaluation: PACU Anesthesia Type: General Level of consciousness: awake and alert Pain management: pain level controlled Vital Signs Assessment: post-procedure vital signs reviewed and stable Respiratory status: spontaneous breathing, nonlabored ventilation, respiratory function stable and patient connected to nasal cannula oxygen Cardiovascular status: blood pressure returned to baseline and stable Postop Assessment: no apparent nausea or vomiting Anesthetic complications: no    Last Vitals:  Vitals:   02/10/19 1815 02/10/19 1830  BP: (!) 143/75 (!) 145/70  Pulse: 96 91  Resp: 18 20  Temp: (!) 36.2 C (!) 36.2 C  SpO2: 100% 100%    Last Pain:  Vitals:   02/10/19 1830  TempSrc:   PainSc: 0-No pain                 Jaiven Graveline S

## 2019-02-10 NOTE — Consult Note (Signed)
ORTHOPAEDIC CONSULTATION  REQUESTING PHYSICIAN: Elodia Florence., *  Chief Complaint: R hip fracture  HPI: Hayden [name] is a 78 y.o. male with acute traumatic fall from standing resulting in right hip pain and inability to ambulate.  No LOC, at baseline state of health prior to fall.  Unwitnessed, patient in memory care unit.  Reports significant pain at site of injury, no other areas of pain reported acutely.   Spoke and obtained consent from Sister: Hayden [name] (maiden name Bartko) home: [phone], cell [phone].  Spoke to Dr. Florene Glen who stated patient is apparently near baseline with health though very confused.  Pending CT head and neck ok for surgery today.  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   Patient attempts to grab and or grope male patients   Leukemia ([place]) chronic lymphocytic   2008 diagnosed-monitored Dr    Past Surgical History:  Procedure Laterality Date   ADENOIDECTOMY     CARPAL TUNNEL RELEASE     rt hand   CATARACT EXTRACTION, [place]  07/26/2012   Procedure: CYSTOSCOPY;  Surgeon: Ailene Rud, MD;  Location: [facility];  Service: Urology;  Laterality: N/A;  DIAGNOSTIC CYSTO PROSTATE ULTRASOUND       PROSTATE BIOPSY  07/26/2012   Procedure: PROSTATE BIOPSY;  Surgeon: Ailene Rud, MD;  Location[facility];  Service: Urology;  Laterality: N/A;   retinal micro aneurysms     tonsil     VASECTOMY     VITRECTOMY     Social History   Socioeconomic History   Marital status: Married    Spouse name: Not on file   Number of children: Not on file   Years of education: Not on  file   Highest education level: Not on file  Occupational History   Not on file  Social Needs   Financial resource strain: Not on file   Food insecurity    Worry: Not on file    Inability: Not on file   Transportation needs    Medical: Not on file    Non-medical: Not on file  Tobacco Use   Smoking status: Never Smoker   Smokeless tobacco: Never Used  Substance and Sexual Activity   Alcohol use: No    Frequency: Never   Drug use: No   Sexual activity: Not on file  Lifestyle   Physical activity    Days per week: Not on file    Minutes per session: Not on file   Stress: Not on file  Relationships   Social connections    Talks on phone: Not on file    Gets together: Not on file    Attends religious service: Not on file    Active member of club or organization: Not on file    Attends meetings of clubs or organizations: Not on file    Relationship status: Not on file  Other Topics Concern   Not on file  Social History Narrative   Not on file   Family History  Problem Relation Age of Onset   Diabetes Father  Diabetes Sister    Diabetes Brother    Allergies  Allergen Reactions   Bee Venom Other (See Comments)    Bump where stung and took days to go away   Glucophage [Metformin] Diarrhea    "I will never take it again - stool incontinent"   Shellfish Allergy Swelling   Grass Extracts [Gramineae Pollens] Other (See Comments)    Unknown rxn   Tobacco [Nicotiana Tabacum] Other (See Comments)    Unknown rxn   Prior to Admission medications   Medication Sig Start Date End Date Taking? Authorizing Provider  acetaminophen (TYLENOL) 500 MG tablet Take 500 mg by mouth every 4 (four) hours as needed for fever or headache.   Yes [provider]  alum & mag hydroxide-simeth (MAALOX/MYLANTA) 200-200-20 MG/5ML suspension Take 30 mLs by mouth every 6 (six) hours as needed for indigestion or heartburn. Do not exceed 4 doses in 24 hours   Yes  [provider]  aspirin 81 MG chewable tablet Chew 81 mg by mouth daily.   Yes [provider]  carbamazepine (TEGRETOL) 200 MG tablet Take 200-300 mg by mouth See admin instructions. Take 300 mg by mouth in the morning and take 200 mg by mouth at bedtime   Yes [provider]  docusate sodium (COLACE) 100 MG capsule Take 100 mg by mouth daily.    Yes [provider]  donepezil (ARICEPT) 10 MG tablet Take 1 tablet (10 mg total) by mouth at bedtime. 05/03/18  Yes Cameron Sprang, MD  EPINEPHrine [facility] JR) 0.15 MG/0.3ML injection Inject 0.15 mg into the muscle as needed for anaphylaxis.   Yes [provider]  guaiFENesin (ROBITUSSIN) 100 MG/5ML liquid Take 200 mg by mouth every 6 (six) hours as needed for cough.   Yes [provider]  HYDROcodone-acetaminophen (NORCO/VICODIN) 5-325 MG tablet Take 1 tablet by mouth every 6 (six) hours as needed for moderate pain.   Yes [provider]  insulin lispro (HUMALOG) 100 UNIT/ML KwikPen Inject 6 Units into the skin 3 (three) times daily with meals. 10/11/18  Yes [provider]  LEVEMIR FLEXTOUCH 100 UNIT/ML Pen Inject 30 Units into the skin at bedtime.  07/16/15  Yes [provider]  Lidocaine HCl (ASPERCREME W/LIDOCAINE) 4 % CREA Apply 1 application topically 2 (two) times daily.    Yes [provider]  loperamide (IMODIUM) 2 MG capsule Take 2 mg by mouth See admin instructions. Take 2 mg by mouth with each loose stool as needed for diarrhea (do not exceed 8 doses in 24 hours)   Yes [provider]  LORazepam (ATIVAN) 1 MG tablet Take 1 mg by mouth every 8 (eight) hours as needed for anxiety (agitation).   Yes [provider]  lovastatin (MEVACOR) 20 MG tablet Take 20 mg by mouth daily.   Yes [provider]  magnesium hydroxide (MILK OF MAGNESIA) 400 MG/5ML suspension Take 30 mLs by mouth at bedtime as needed for mild constipation.   Yes  [provider]  neomycin-bacitracin-polymyxin (NEOSPORIN) [phone] ointment Apply 1 application topically See admin instructions. *Standing order for skin tear, abrasions* Clean area with normal saline. Apply tao, cover with bandaid or gauze and tape. Change as needed until healed.   Yes [provider]  risperiDONE (RISPERDAL) 1 MG tablet Take 1 mg by mouth 2 (two) times a day. 09/21/18  Yes [provider]  Skin Protectants, Misc. (DIMETHICONE-ZINC OXIDE) cream Apply 1 application topically See admin instructions. Apply to buttocks  and scrotum with each incontinence episode as needed   Yes [provider]  tamsulosin (FLOMAX) 0.4 MG CAPS capsule Take 0.4 mg by mouth every evening.    Yes [provider]  traZODone (DESYREL) 100 MG tablet Take 200 mg by mouth at bedtime.    Yes [provider]  nystatin cream (MYCOSTATIN) Apply to affected area 2 times daily Patient not taking: Reported on 01/13/2019 04/23/18   [name], PA-C  sulfamethoxazole-trimethoprim (BACTRIM DS,SEPTRA DS) 800-160 MG tablet Take 1 tablet by mouth 2 (two) times daily. Patient not taking: Reported on 01/13/2019 07/05/18   Georgette Shell, MD  calcium citrate-vitamin D (CITRACAL+D) 315-200 MG-UNIT per tablet Take 1 tablet by mouth daily.  09/04/11  [provider]  terazosin (HYTRIN) 5 MG capsule Take 5 mg by mouth at bedtime.  09/04/11  [provider]   Dg Chest Port 1 View  Result Date: 02/10/2019 CLINICAL DATA:  Pain after fall. EXAM: PORTABLE CHEST 1 VIEW COMPARISON:  None. FINDINGS: The heart size and mediastinal contours are within normal limits. Both lungs are clear. No pneumothorax or pleural effusion is noted. The visualized skeletal structures are unremarkable. IMPRESSION: No active disease. Aortic Atherosclerosis (ICD10-I70.0). Electronically Signed   By: Marijo Conception M.D.   On: 02/10/2019 10:48   Dg Knee Right Port  Result Date:  02/10/2019 CLINICAL DATA:  Right knee pain EXAM: PORTABLE RIGHT KNEE - 1-2 VIEW COMPARISON:  None. FINDINGS: No acute fracture. No malalignment. Mild medial compartment joint space narrowing. Chondrocalcinosis of the menisci. No knee joint effusion. Small quadriceps tendon enthesophyte. Extensive vascular calcifications. IMPRESSION: No acute osseous abnormality, right knee. Electronically Signed   By: Davina Poke M.D.   On: 02/10/2019 10:49   Dg Hip Unilat With Pelvis 2-3 Views Right  Result Date: 02/10/2019 CLINICAL DATA:  Right hip pain EXAM: DG HIP (WITH OR WITHOUT PELVIS) 2-3V RIGHT COMPARISON:  09/09/2013 FINDINGS: Acute intertrochanteric fracture of the proximal right femur with slight impaction and coxa vara alignment. Right hip joint remains aligned without dislocation. Bones are demineralized. No additional fractures are identified. Advanced vascular calcifications. IMPRESSION: Acute mildly impacted and angulated intertrochanteric fracture of the proximal right femur. Electronically Signed   By: Davina Poke M.D.   On: 02/10/2019 10:48   Family History Reviewed and non-contributory, no pertinent history of problems with bleeding or anesthesia      Review of Systems 14 system ROS conducted and negative except for that noted in HPI   OBJECTIVE  Vitals: Patient Vitals for the past 8 hrs:  BP Temp Temp src Pulse Resp SpO2  02/10/19 1110 130/63 -- -- 79 10 98 %  02/10/19 1000 (!) 137/57 -- -- 79 12 96 %  02/10/19 0930 (!) 152/65 -- -- 80 (!) 9 100 %  02/10/19 0830 (!) 167/76 -- -- 85 13 100 %  02/10/19 0800 (!) 175/75 97.8 F (36.6 C) Oral 85 18 100 %   General: agitated, no acute distress Cardiovascular: Warm extremities noted Respiratory: No cyanosis, no use of accessory musculature GI: No organomegaly, abdomen is soft and non-tender Skin: No lesions in the area of chief complaint other than those listed below in MSK exam.  Neurologic: Sensation intact distally save for  the below mentioned MSK exam Psychiatric: Demented Lymphatic: No swelling obvious and reported other than the area involved in the exam below Extremities  IOX:[id number] and externally rotated.  ROM deferred. + GS/TA/EHL. Sensation intact in DP/SP/S/S/P distributions. 2+ DP pulse with warm  and well perfused digits. Compartments soft and compressible, with no pain on passive stretch.     Test Results Imaging Moderately displaced R basicervical vs intertroch femoral fracture  Labs cbc Recent Labs    02/10/19 0812  WBC 19.1*  HGB 14.0  HCT 40.6  PLT 200    Labs inflam No results for input(s): CRP in the last 72 hours.  Invalid input(s): ESR  Labs coag Recent Labs    02/10/19 0812  INR 1.0    Recent Labs    02/10/19 0812  NA 140  K 3.8  CL 104  CO2 24  GLUCOSE 98  BUN 17  CREATININE 0.82  CALCIUM 9.5     ASSESSMENT AND PLAN: 78 y.o. male with the following: R intertroch/basicervical neck fracture  Discussed options and non-operative versus operative measures.  Non-operative measures have a predictably poor set of outcomes in an ambulatory patient including bedsores and other medical complications.  Understanding this the patient/family elected to proceed with operative measures.  The risks and benefits of  surgical intervention including infection, bleeding, nerve injury, periprosthetic fracture, the need for revision surgery, leg length discrepancy, gait change, blood clots, cardiopulmonary complications, morbidity, mortality, among others, and they were willing to proceed.    Plan for OR this afternoon pending CT's of head and neck.  Covid negative from today.  WBAT postop.  Plan for IM nail R hip.

## 2019-02-10 NOTE — ED Notes (Signed)
ED Provider at bedside. 

## 2019-02-10 NOTE — Anesthesia Procedure Notes (Signed)
Procedure Name: Intubation Date/Time: 02/10/2019 4:24 PM Performed by: Anne Fu, CRNA Pre-anesthesia Checklist: Patient identified, Emergency Drugs available, Suction available, Patient being monitored and Timeout performed Patient Re-evaluated:Patient Re-evaluated prior to induction Oxygen Delivery Method: Circle system utilized Preoxygenation: Pre-oxygenation with 100% oxygen Induction Type: IV induction, Rapid sequence and Cricoid Pressure applied Laryngoscope Size: Mac and 4 Grade View: Grade II Tube type: Oral Tube size: 7.5 mm Number of attempts: 1 Airway Equipment and Method: Stylet Placement Confirmation: ETT inserted through vocal cords under direct vision,  positive ETCO2 and breath sounds checked- equal and bilateral Secured at: 21 cm Tube secured with: Tape Dental Injury: Teeth and Oropharynx as per pre-operative assessment

## 2019-02-10 NOTE — H&P (Signed)
History and Physical    Hayden CASILLAS SFK:[id number] DOB: 03-24-1941 DOA: 02/10/2019  PCP: Patient, No Pcp Per Patient coming from: [facility]  I have personally briefly reviewed patient's old medical records in [place]  Chief Complaint: fall, R hip pain  HPI: Hayden [name] is [name] 78 y.o. male with medical history significant of dementia, depression, T2DM, fibromyalgia, and multiple other medical problems presenting after being found down with R femur fracture.    History is limited due to patients mental status.  Obtained from chart, EDP, and Environmental education officer.  Pt unable to relay what happened.  He was found down after unwitnessed fall.  The staff is unable to report much more than this.  Pt complains of R hip pain.  At baseline, pt is able to ambulate.     ED Course: Labs, EKG, imaging.  Orthopedic c/s.  Admit to hospitalist due to R femur fracture.  Review of Systems: Unable to obtain due to mental status  Past Medical History:  Diagnosis Date  . Candidiasis   . Depression   . Diabetes mellitus   . Enlarged prostate   . Fibromyalgia   . GERD (gastroesophageal reflux disease)    reflux intermittent  . Hyperlipidemia   . 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 ADENOIDECTOMY    . CARPAL TUNNEL RELEASE     rt hand  . CATARACT EXTRACTION, BILATERAL    . CERVICAL FUSION  1998  . CYSTOSCOPY  07/26/2012   Procedure: CYSTOSCOPY;  Surgeon: Ailene Rud, MD;  Location: [facility];  Service: Urology;  Laterality: N/[place];  DIAGNOSTIC CYSTO PROSTATE ULTRASOUND      . PROSTATE BIOPSY  07/26/2012   Procedure: PROSTATE BIOPSY;  Surgeon: Ailene Rud, MD;  Location: [facility];  Service: Urology;  Laterality: N/[place];  . retinal micro aneurysms    . tonsil     . VASECTOMY    . VITRECTOMY       reports that he has never smoked. He has never used smokeless tobacco. He reports that he does not drink alcohol or use drugs.  Allergies  Allergen Reactions  . Bee Venom Other (See Comments)    Bump where stung and took days to go away  . Glucophage [Metformin] Diarrhea    "I will never take it again - stool incontinent"  . Shellfish Allergy Swelling  . Grass Extracts [Gramineae Pollens] Other (See Comments)    Unknown rxn  . Tobacco [Nicotiana Tabacum] Other (See Comments)    Unknown rxn    Family History  Problem Relation Age of Onset  . Diabetes Father   . Diabetes Sister   . Diabetes Brother    Prior to Admission medications   Medication Sig Start Date End Date Taking? Authorizing Provider  acetaminophen (TYLENOL) 500 MG tablet Take 500 mg by mouth every 4 (four) hours as needed for fever or headache.   Yes [provider]  alum & mag hydroxide-simeth (MAALOX/MYLANTA) 200-200-20 MG/5ML suspension Take 30 mLs by mouth every 6 (six) hours as needed for indigestion or heartburn. Do not exceed 4 doses in 24 hours   Yes [provider]  aspirin 81 MG chewable tablet Chew 81 mg by mouth daily.   Yes [provider]  carbamazepine (TEGRETOL) 200  MG tablet Take 200-300 mg by mouth See admin instructions. Take 300 mg by mouth in the morning and take 200 mg by mouth at bedtime   Yes [provider]  docusate sodium (COLACE) 100 MG capsule Take 100 mg by mouth daily.    Yes [provider]  donepezil (ARICEPT) 10 MG tablet Take 1 tablet (10 mg total) by mouth at bedtime. 05/03/18  Yes Cameron Sprang, MD  EPINEPHrine [facility]) 0.15 MG/0.3ML injection Inject 0.15 mg into the muscle as needed for anaphylaxis.   Yes [provider]  guaiFENesin (ROBITUSSIN) 100 MG/5ML liquid Take 200 mg by mouth every 6 (six) hours as needed for cough.   Yes [provider]  HYDROcodone-acetaminophen  (NORCO/VICODIN) 5-325 MG tablet Take 1 tablet by mouth every 6 (six) hours as needed for moderate pain.   Yes [provider]  insulin lispro (HUMALOG) 100 UNIT/ML KwikPen Inject 6 Units into the skin 3 (three) times daily with meals. 10/11/18  Yes [provider]  LEVEMIR FLEXTOUCH 100 UNIT/ML Pen Inject 30 Units into the skin at bedtime.  07/16/15  Yes [provider]  Lidocaine HCl (ASPERCREME W/LIDOCAINE) 4 % CREA Apply 1 application topically 2 (two) times daily.    Yes [provider]  loperamide (IMODIUM) 2 MG capsule Take 2 mg by mouth See admin instructions. Take 2 mg by mouth with each loose stool as needed for diarrhea (do not exceed 8 doses in 24 hours)   Yes [provider]  LORazepam (ATIVAN) 1 MG tablet Take 1 mg by mouth every 8 (eight) hours as needed for anxiety (agitation).   Yes [provider]  lovastatin (MEVACOR) 20 MG tablet Take 20 mg by mouth daily.   Yes [provider]  magnesium hydroxide (MILK OF MAGNESIA) 400 MG/5ML suspension Take 30 mLs by mouth at bedtime as needed for mild constipation.   Yes [provider]  neomycin-bacitracin-polymyxin (NEOSPORIN) [phone] ointment Apply 1 application topically See admin instructions. *Standing order for skin tear, abrasions* Clean area with normal saline. Apply tao, cover with bandaid or gauze and tape. Change as needed until healed.   Yes [provider]  risperiDONE (RISPERDAL) 1 MG tablet Take 1 mg by mouth 2 (two) times [place] day. 09/21/18  Yes [provider]  Skin Protectants, Misc. (DIMETHICONE-ZINC OXIDE) cream Apply 1 application topically See admin instructions. Apply to buttocks and scrotum with each incontinence episode as needed   Yes [provider]  tamsulosin (FLOMAX) 0.4 MG CAPS capsule Take 0.4 mg by mouth every evening.    Yes [provider]  traZODone (DESYREL) 100 MG tablet Take 200 mg by mouth at bedtime.     Yes [provider]  nystatin cream (MYCOSTATIN) Apply to affected area 2 times daily Patient not taking: Reported on 01/13/2019 04/23/18   [name], PA-C  sulfamethoxazole-trimethoprim (BACTRIM DS,SEPTRA DS) 800-160 MG tablet Take 1 tablet by mouth 2 (two) times daily. Patient not taking: Reported on 01/13/2019 07/05/18   Georgette Shell, MD  calcium citrate-vitamin D (CITRACAL+D) 315-200 MG-UNIT per tablet Take 1 tablet by mouth daily.  09/04/11  [provider]  terazosin (HYTRIN) 5 MG capsule Take 5 mg by mouth at bedtime.  09/04/11  [provider]    Physical Exam: Vitals:   02/10/19 1110 02/10/19 1230 02/10/19 1400 02/10/19 1603  BP: 130/63 (!) 184/83 131/65 (!) 157/75  Pulse: 79 99 (!) 105 96  Resp: 10 12 15  Temp:      TempSrc:      SpO2: 98% 100% 100% 100%    Constitutional: NAD, calm, comfortable Vitals:   02/10/19 1110 02/10/19 1230 02/10/19 1400 02/10/19 1603  BP: 130/63 (!) 184/83 131/65 (!) 157/75  Pulse: 79 99 (!) 105 96  Resp: 10 12 15    Temp:      TempSrc:      SpO2: 98% 100% 100% 100%   Eyes: PERRL, lids and conjunctivae normal ENMT: Mucous membranes are moist. Posterior pharynx clear of any exudate or lesions.Normal dentition.  Neck: normal, supple, no masses, no thyromegaly Respiratory: clear to auscultation bilaterally, no wheezing, no crackles. Normal respiratory effort. No accessory muscle use.  Cardiovascular: Regular rate and rhythm, no murmurs / rubs / gallops. No extremity edema. 2+ pedal pulses. No carotid bruits.  Abdomen: no tenderness, no masses palpated. No hepatosplenomegaly. Bowel sounds positive.  Musculoskeletal: RLE shortened and externally rotated Skin: no rashes, lesions, ulcers. No induration Neurologic: CN 2-12 grossly intact. Sensation intact. Moves all extremities (able to wiggle toes on R foot) Psychiatric: Normal judgment and insight. Alert and disoriented (only alert to self). Normal mood.   Labs on  Admission: I have personally reviewed following labs and imaging studies  CBC: Recent Labs  Lab 02/10/19 0812  WBC 19.1*  NEUTROABS 8.4*  HGB 14.0  HCT 40.6  MCV 94.4  PLT 518   Basic Metabolic Panel: Recent Labs  Lab 02/10/19 0812  NA 140  K 3.8  CL 104  CO2 24  GLUCOSE 98  BUN 17  CREATININE 0.82  CALCIUM 9.5   GFR: CrCl cannot be calculated (Unknown ideal weight.). Liver Function Tests: Recent Labs  Lab 02/10/19 0812  AST 25  ALT 21  ALKPHOS 75  BILITOT 0.9  PROT 7.0  ALBUMIN 4.5   No results for input(s): LIPASE, AMYLASE in the last 168 hours. No results for input(s): AMMONIA in the last 168 hours. Coagulation Profile: Recent Labs  Lab 02/10/19 0812  INR 1.0   Cardiac Enzymes: No results for input(s): CKTOTAL, CKMB, CKMBINDEX, TROPONINI in the last 168 hours. BNP (last 3 results) No results for input(s): PROBNP in the last 8760 hours. HbA1C: No results for input(s): HGBA1C in the last 72 hours. CBG: Recent Labs  Lab 02/10/19 1602  GLUCAP 169*   Lipid Profile: No results for input(s): CHOL, HDL, LDLCALC, TRIG, CHOLHDL, LDLDIRECT in the last 72 hours. Thyroid Function Tests: No results for input(s): TSH, T4TOTAL, FREET4, T3FREE, THYROIDAB in the last 72 hours. Anemia Panel: No results for input(s): VITAMINB12, FOLATE, FERRITIN, TIBC, IRON, RETICCTPCT in the last 72 hours. Urine analysis:    Component Value Date/Time   COLORURINE YELLOW 01/13/2019 1751   APPEARANCEUR CLEAR 01/13/2019 1751   LABSPEC 1.016 01/13/2019 1751   PHURINE 7.0 01/13/2019 1751   GLUCOSEU 150 (Tanikka Bresnan) 01/13/2019 1751   HGBUR NEGATIVE 01/13/2019 1751   BILIRUBINUR NEGATIVE 01/13/2019 1751   KETONESUR NEGATIVE 01/13/2019 1751   PROTEINUR NEGATIVE 01/13/2019 1751   NITRITE NEGATIVE 01/13/2019 1751   LEUKOCYTESUR NEGATIVE 01/13/2019 1751    Radiological Exams on Admission: Ct Head Wo Contrast  Result Date: 02/10/2019 CLINICAL DATA:  Unwitnessed fall. EXAM: CT CERVICAL  SPINE WITHOUT CONTRAST TECHNIQUE: Multidetector CT imaging of the cervical spine was performed without intravenous contrast. Multiplanar CT image reconstructions were also generated. COMPARISON:  None. FINDINGS: Alignment: Normal Skull base and vertebrae: No evidence of fracture. Chronic fusion at C5-6. Soft tissues and spinal canal: Negative Disc levels:  Chronic  degenerative spondylosis C4-5, C6-7 and C7-T1. Upper chest: Negative Other: None IMPRESSION: No acute or traumatic finding. 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 Electronically Signed   By: [name] Chimes M.D.   On: 02/10/2019 14:46   Ct Cervical Spine Wo Contrast  Result Date: 02/10/2019 CLINICAL DATA:  Unwitnessed fall. EXAM: CT CERVICAL SPINE WITHOUT CONTRAST TECHNIQUE: Multidetector CT imaging of the cervical spine was performed without intravenous contrast. Multiplanar CT image reconstructions were also generated. COMPARISON:  None. FINDINGS: Alignment: Normal Skull base and vertebrae: No evidence of fracture. Chronic fusion at C5-6. 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 Upper chest: Negative Other: None IMPRESSION: No acute or traumatic finding. 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 Electronically Signed   By: [name] Chimes M.D.   On: 02/10/2019 14:46   Dg Chest Port 1 View  Result Date: 02/10/2019 CLINICAL DATA:  Pain after fall. EXAM: PORTABLE CHEST 1 VIEW COMPARISON:  None. FINDINGS: The heart size and mediastinal contours are within normal limits. Both lungs are clear. No pneumothorax or pleural effusion is noted. The visualized skeletal structures are unremarkable. IMPRESSION: No active disease. Aortic Atherosclerosis (ICD10-I70.0). Electronically Signed   By: Marijo Conception M.D.   On: 02/10/2019 10:48   Dg Knee Right Port  Result Date: 02/10/2019 CLINICAL DATA:  Right knee pain EXAM: PORTABLE  RIGHT KNEE - 1-2 VIEW COMPARISON:  None. FINDINGS: No acute fracture. No malalignment. Mild medial compartment joint space narrowing. Chondrocalcinosis of the menisci. No knee joint effusion. Small quadriceps tendon enthesophyte. Extensive vascular calcifications. IMPRESSION: No acute osseous abnormality, right knee. Electronically Signed   By: Davina Poke M.D.   On: 02/10/2019 10:49   Dg Hip Unilat With Pelvis 2-3 Views Right  Result Date: 02/10/2019 CLINICAL DATA:  Right hip pain EXAM: DG HIP (WITH OR WITHOUT PELVIS) 2-3V RIGHT COMPARISON:  09/09/2013 FINDINGS: Acute intertrochanteric fracture of the proximal right femur with slight impaction and coxa vara alignment. Right hip joint remains aligned without dislocation. Bones are demineralized. No additional fractures are identified. Advanced vascular calcifications. IMPRESSION: Acute mildly impacted and angulated intertrochanteric fracture of the proximal right femur. Electronically Signed   By: Davina Poke M.D.   On: 02/10/2019 10:48    EKG: Independently reviewed. Sinus rhythm, LAD.  Appears similar to priors.  Assessment/Plan Active Problems:   Femur fracture, right (HCC)  Right Femur Fracture: pt found down this morning c/o R hip pain.  Imaging with mildly impacted and angulated intertochanteric fracture of the proximal R femur. CT head and neck without acute findings Negative plain film of knee. RCRI is 1 for DM.  Discussed with sister.  Risks/benefits.  Plan to proceed with surgery. Scheduled APAP, oxycodone prn PT/OT post op DVT ppx post op per ortho Needs osteoporosis treatment and f/u outpatient  Leukocytosis: likely reactive, follow  Dementia  Depression: aricept, risperdal, carbamazepime, ativan, troazodone Delirium precautions  HLD: lovastatin (pharmacy sub)  T2DM: levemir at 15 units nightly (half dose for now), continue SSI.  Start mealtime as needed. Continue asa/statin  BPH: flomax  DVT prophylaxis:  SCDs, defer post surgery dvt ppx to ortho  Code Status: DNR Family Communication: discussed with sister Disposition Plan: pending surgery, PT/OT, possible SNF Consults called: ortho Admission status: inpatient    Fayrene Helper MD [facility] Pager AMION  If 7PM-7AM, please contact night-coverage [email or web address] Password [facility]  02/10/2019, 5:39 PM

## 2019-02-10 NOTE — Anesthesia Preprocedure Evaluation (Signed)
Anesthesia Evaluation  Patient identified by MRN, date of birth, ID band Patient awake    Reviewed: Allergy & Precautions, NPO status , Patient's Chart, lab work & pertinent test results  Airway Mallampati: II  TM Distance: >3 FB Neck ROM: Full    Dental no notable dental hx.    Pulmonary neg pulmonary ROS,    Pulmonary exam normal breath sounds clear to auscultation       Cardiovascular negative cardio ROS Normal cardiovascular exam Rhythm:Regular Rate:Normal     Neuro/Psych Depression Dementia negative neurological ROS     GI/Hepatic Neg liver ROS, GERD  ,  Endo/Other  diabetes  Renal/GU negative Renal ROS  negative genitourinary   Musculoskeletal negative musculoskeletal ROS (+)   Abdominal   Peds negative pediatric ROS (+)  Hematology negative hematology ROS (+)   Anesthesia Other Findings   Reproductive/Obstetrics negative OB ROS                             Anesthesia Physical Anesthesia Plan  ASA: III  Anesthesia Plan: General   Post-op Pain Management:    Induction: Intravenous  PONV Risk Score and Plan: 2 and Ondansetron and Treatment may vary due to age or medical condition  Airway Management Planned: Oral ETT  Additional Equipment:   Intra-op Plan:   Post-operative Plan: Extubation in OR  Informed Consent: I have reviewed the patients History and Physical, chart, labs and discussed the procedure including the risks, benefits and alternatives for the proposed anesthesia with the patient or authorized representative who has indicated his/her understanding and acceptance.     Dental advisory given  Plan Discussed with: CRNA and Surgeon  Anesthesia Plan Comments:         Anesthesia Quick Evaluation

## 2019-02-10 NOTE — ED Notes (Signed)
Patient transported to CT/XR ?

## 2019-02-10 NOTE — ED Notes (Signed)
Michael from radiology came to do patient's x ray's and patient found to have scooted to the end of the bed. Patient states"oh, my leg hurts so bad." Annieah,NT, this Probation officer and Jonna NT assisted patient back into the bed.

## 2019-02-10 NOTE — Progress Notes (Signed)
Lorelle Gibbs of [place] from [facility], called into PACU to ask about pt status.  RN informed DON that he is in OR for fracture hip and advised him to call family for further information

## 2019-02-10 NOTE — Transfer of Care (Signed)
Immediate Anesthesia Transfer of Care Note  Patient: Hayden [name]  Procedure(s) Performed: Procedure(s): INTRAMEDULLARY (IM) NAIL FEMORAL (Right)  Patient Location: PACU  Anesthesia Type:General  Level of Consciousness:  sedated, patient cooperative and responds to stimulation  Airway & Oxygen Therapy:Patient Spontanous Breathing and Patient connected to face mask oxgen  Post-op Assessment:  Report given to PACU RN and Post -op Vital signs reviewed and stable  Post vital signs:  Reviewed and stable  Last Vitals:  Vitals:   02/10/19 1603 02/10/19 1745  BP: (!) 157/75   Pulse: 96   Resp:    Temp:  (!) (P) 36.2 C  SpO2: 488%     Complications: No apparent anesthesia complications

## 2019-02-10 NOTE — ED Notes (Signed)
ED TO INPATIENT HANDOFF REPORT  ED Nurse Name and Phone #: [phone]  S Name/Age/Gender [name] 78 y.o. male Room/Bed: WA17/WA17  Code Status   Code Status: Prior  Home/SNF/Other Skilled nursing facility Patient oriented to: self and situation Is this baseline? Yes   Triage Complete: Triage complete  Chief Complaint leg and back pain  Triage Note Transported by [facility] from [facility]-- unwitnessed fall, injuring lower back and right leg. Hx of dementia, AAO x 2 which is baseline.    Allergies Allergies  Allergen Reactions  . Bee Venom Other (See Comments)    Bump where stung and took days to go away  . Glucophage [Metformin] Diarrhea    "I will never take it again - stool incontinent"  . Shellfish Allergy Swelling  . Grass Extracts [Gramineae Pollens] Other (See Comments)    Unknown rxn  . Tobacco [Nicotiana Tabacum] Other (See Comments)    Unknown rxn    Level of Care/Admitting Diagnosis ED Disposition    ED Disposition Condition Comment   Admit  Hospital Area: [place] [[id number]]  Level of Care: Med-Surg [16]  Covid Evaluation: Confirmed COVID Negative  Diagnosis: Femur fracture, right [facility]) [[id number]]  Admitting Physician: Elodia Florence [phone]  Attending Physician: Cephus Slater, A [place] [phone]  Estimated length of stay: past midnight tomorrow  Certification:: I certify this patient will need inpatient services for at least 2 midnights  PT Class (Do Not Modify): Inpatient [101]  PT Acc Code (Do Not Modify): Private [1]       B Medical/Surgery History Past Medical History:  Diagnosis Date  . Candidiasis   . Depression   . Diabetes mellitus   . Enlarged prostate   . Fibromyalgia   . GERD (gastroesophageal reflux disease)    reflux intermittent  . Hyperlipidemia   . 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 ADENOIDECTOMY    . CARPAL TUNNEL RELEASE     rt hand  . CATARACT EXTRACTION, BILATERAL    . CERVICAL FUSION  1998  . CYSTOSCOPY  07/26/2012   Procedure: CYSTOSCOPY;  Surgeon: Ailene Rud, MD;  Location: [facility];  Service: Urology;  Laterality: N/A;  DIAGNOSTIC CYSTO PROSTATE ULTRASOUND      . PROSTATE BIOPSY  07/26/2012   Procedure: PROSTATE BIOPSY;  Surgeon: Ailene Rud, MD;  Location: [facility];  Service: Urology;  Laterality: N/A;  . retinal micro aneurysms    . tonsil    . VASECTOMY    . VITRECTOMY       A IV Location/Drains/Wounds Patient Lines/Drains/Airways Status   Active Line/Drains/Airways    Name:   Placement date:   Placement time:   Site:   Days:   Peripheral IV 02/10/19 Right Forearm   02/10/19    0833    Forearm   less than 1   Pressure Injury 05/22/18 Stage I -  Intact skin with non-blanchable redness of a localized area usually over a bony prominence.   05/22/18    2100     264   Wound / Incision (Open or Dehisced) 03/08/18 Other (Comment) Penis Left;Lateral small red sore    03/08/18    2100    Penis   339  Intake/Output Last 24 hours No intake or output data in the 24 hours ending 02/10/19 1427  Labs/Imaging Results for orders placed or performed during the hospital encounter of 02/10/19 (from the past 48 hour(s))  CBC WITH DIFFERENTIAL     Status: Abnormal   Collection Time: 02/10/19  8:12 AM  Result Value Ref Range   WBC 19.1 (H) 4.0 - 10.5 K/uL   RBC 4.30 4.22 - 5.81 MIL/uL   Hemoglobin 14.0 13.0 - 17.0 g/dL   HCT 40.6 39.0 - 52.0 %   MCV 94.4 80.0 - 100.0 fL   MCH 32.6 26.0 - 34.0 pg   MCHC 34.5 30.0 - 36.0 g/dL   RDW 13.0 11.5 - 15.5 %   Platelets 200 150 - 400 K/uL   nRBC 0.0 0.0 - 0.2 %   Neutrophils Relative % 44 %   Neutro Abs 8.4 (H) 1.7 - 7.7 K/uL   Lymphocytes Relative 52 %   Lymphs Abs 10.0  (H) 0.7 - 4.0 K/uL   Monocytes Relative 3 %   Monocytes Absolute 0.5 0.1 - 1.0 K/uL   Eosinophils Relative 1 %   Eosinophils Absolute 0.1 0.0 - 0.5 K/uL   Basophils Relative 0 %   Basophils Absolute 0.1 0.0 - 0.1 K/uL   WBC Morphology ABSOLUTE LYMPHOCYTOSIS    Immature Granulocytes 0 %   Abs Immature Granulocytes 0.07 0.00 - 0.07 K/uL    Comment: Performed at [facility], [place] [address] [place], [place] [address]  Protime-INR     Status: None   Collection Time: 02/10/19  8:12 AM  Result Value Ref Range   Prothrombin Time 13.2 11.4 - 15.2 seconds   INR 1.0 0.8 - 1.2    Comment: (NOTE) INR goal varies based on device and disease states. Performed at [facility], [place] [address] [place], [place] [address]   Type and screen [place]     Status: None   Collection Time: 02/10/19  8:12 AM  Result Value Ref Range   ABO/RH(D) A POS    Antibody Screen NEG    Sample Expiration      02/13/2019,2359 Performed at [facility], [place] [address] [place], [place] [address]   Comprehensive metabolic panel     Status: None   Collection Time: 02/10/19  8:12 AM  Result Value Ref Range   Sodium 140 135 - 145 mmol/L   Potassium 3.8 3.5 - 5.1 mmol/L   Chloride 104 98 - 111 mmol/L   CO2 24 22 - 32 mmol/L   Glucose, Bld 98 70 - 99 mg/dL   BUN 17 8 - 23 mg/dL   Creatinine, Ser 0.82 0.61 - 1.24 mg/dL   Calcium 9.5 8.9 - 10.3 mg/dL   Total Protein 7.0 6.5 - 8.1 g/dL   Albumin 4.5 3.5 - 5.0 g/dL   AST 25 15 - 41 U/L   ALT 21 0 - 44 U/L   Alkaline Phosphatase 75 38 - 126 U/L   Total Bilirubin 0.9 0.3 - 1.2 mg/dL   GFR calc non Af Amer >60 >60 mL/min   GFR calc Af Amer >60 >60 mL/min   Anion gap 12 5 - 15    Comment: Performed at [facility], [place] [address], [place], [place] [address]  SARS Coronavirus 2 [facility] order, Performed in [facility] hospital lab) Nasopharyngeal Nasopharyngeal Swab      Status: None   Collection Time: 02/10/19  8:12 AM  Specimen: Nasopharyngeal Swab  Result Value Ref Range   SARS Coronavirus 2 NEGATIVE NEGATIVE    Comment: (NOTE) If result is NEGATIVE SARS-CoV-2 target nucleic acids are NOT DETECTED. The SARS-CoV-2 RNA is generally detectable in upper and lower  respiratory specimens during the acute phase of infection. The lowest  concentration of SARS-CoV-2 viral copies this assay can detect is 250  copies / mL. A negative result does not preclude SARS-CoV-2 infection  and should not be used as the sole basis for treatment or other  patient management decisions.  A negative result may occur with  improper specimen collection / handling, submission of specimen other  than nasopharyngeal swab, presence of viral mutation(s) within the  areas targeted by this assay, and inadequate number of viral copies  (<250 copies / mL). A negative result must be combined with clinical  observations, patient history, and epidemiological information. If result is POSITIVE SARS-CoV-2 target nucleic acids are DETECTED. The SARS-CoV-2 RNA is generally detectable in upper and lower  respiratory specimens dur ing the acute phase of infection.  Positive  results are indicative of active infection with SARS-CoV-2.  Clinical  correlation with patient history and other diagnostic information is  necessary to determine patient infection status.  Positive results do  not rule out bacterial infection or co-infection with other viruses. If result is PRESUMPTIVE POSTIVE SARS-CoV-2 nucleic acids MAY BE PRESENT.   A presumptive positive result was obtained on the submitted specimen  and confirmed on repeat testing.  While 2019 novel coronavirus  (SARS-CoV-2) nucleic acids may be present in the submitted sample  additional confirmatory testing may be necessary for epidemiological  and / or clinical management purposes  to differentiate between  SARS-CoV-2 and other Sarbecovirus  currently known to infect humans.  If clinically indicated additional testing with an alternate test  methodology [phone]) is advised. The SARS-CoV-2 RNA is generally  detectable in upper and lower respiratory sp ecimens during the acute  phase of infection. The expected result is Negative. Fact Sheet for Patients:  StrictlyIdeas.no Fact Sheet for Healthcare Providers: BankingDealers.co.za This test is not yet approved or cleared by the [place] FDA and has been authorized for detection and/or diagnosis of SARS-CoV-2 by FDA under an Emergency Use Authorization (EUA).  This EUA will remain in effect (meaning this test can be used) for the duration of the COVID-19 declaration under Section 564(b)(1) of the Act, 21 U.S.C. section 360bbb-3(b)(1), unless the authorization is terminated or revoked sooner. Performed at [facility], [place] [address] [place], [place] [address]   ABO/Rh     Status: None   Collection Time: 02/10/19  8:12 AM  Result Value Ref Range   ABO/RH(D)      A POS Performed at [facility], [place] [address], [place], [place] [address]    Dg Chest Port 1 View  Result Date: 02/10/2019 CLINICAL DATA:  Pain after fall. EXAM: PORTABLE CHEST 1 VIEW COMPARISON:  None. FINDINGS: The heart size and mediastinal contours are within normal limits. Both lungs are clear. No pneumothorax or pleural effusion is noted. The visualized skeletal structures are unremarkable. IMPRESSION: No active disease. Aortic Atherosclerosis (ICD10-I70.0). Electronically Signed   By: Marijo Conception M.D.   On: 02/10/2019 10:48   Dg Knee Right Port  Result Date: 02/10/2019 CLINICAL DATA:  Right knee pain EXAM: PORTABLE RIGHT KNEE - 1-2 VIEW COMPARISON:  None. FINDINGS: No acute fracture. No malalignment. Mild medial compartment joint space narrowing. Chondrocalcinosis of  the menisci. No knee joint effusion. Small  quadriceps tendon enthesophyte. Extensive vascular calcifications. IMPRESSION: No acute osseous abnormality, right knee. Electronically Signed   By: Davina Poke M.D.   On: 02/10/2019 10:49   Dg Hip Unilat With Pelvis 2-3 Views Right  Result Date: 02/10/2019 CLINICAL DATA:  Right hip pain EXAM: DG HIP (WITH OR WITHOUT PELVIS) 2-3V RIGHT COMPARISON:  09/09/2013 FINDINGS: Acute intertrochanteric fracture of the proximal right femur with slight impaction and coxa vara alignment. Right hip joint remains aligned without dislocation. Bones are demineralized. No additional fractures are identified. Advanced vascular calcifications. IMPRESSION: Acute mildly impacted and angulated intertrochanteric fracture of the proximal right femur. Electronically Signed   By: Davina Poke M.D.   On: 02/10/2019 10:48    Pending Labs Unresulted Labs (From admission, onward)    Start     Ordered   02/10/19 1610  Pathologist smear review  Once,   R     02/10/19 9604   Signed and Held  Comprehensive metabolic panel  Tomorrow morning,   R     Signed and Held   Signed and Held  CBC  Tomorrow morning,   R     Signed and Held   Signed and Held  Protime-INR  Tomorrow morning,   R     Signed and Held   Signed and Held  APTT  Tomorrow morning,   R     Signed and Held          Vitals/Pain Today's Vitals   02/10/19 1000 02/10/19 1110 02/10/19 1230 02/10/19 1400  BP: (!) 137/57 130/63 (!) 184/83 131/65  Pulse: 79 79 99 (!) 105  Resp: 12 10 12 15   Temp:      TempSrc:      SpO2: 96% 98% 100% 100%    Isolation Precautions No active isolations  Medications Medications  fentaNYL (SUBLIMAZE) injection 50 mcg (50 mcg Intravenous Given 02/10/19 1250)    Mobility non-ambulatory High fall risk   Focused Assessments Neuro Assessment Handoff:  Swallow screen pass? Yes        Neuro Assessment:   Neuro Checks:      Last Documented NIHSS Modified Score:   Has TPA been given? No If patient is a Neuro  Trauma and patient is going to OR before floor call report to [place] nurse: [phone] or [phone]     R Recommendations: See Admitting Provider Note  Report given to:   Additional Notes:

## 2019-02-10 NOTE — Op Note (Signed)
Orthopaedic Surgery Operative Note (CSN: [id number])  Hayden [name]  30-Aug-1940 Date of Surgery: 02/10/2019   Diagnoses:  RIGHT INTERTROCH FRACTURE  Procedure: Right hip cephalomedullary nail   Operative Finding Successful completion of the planned procedure.  Good fixation, exceptionally good bone quality in the setting of this hip fracture.  Statically locked nail.  Distal interlock was difficult to put across the femur due to bone quality.  Implants: Biomet short 40mm nail, 134mm cephalomedullary screw, 41mm distal interlock  Post-operative plan: The patient will be readmitted to floor.  The patient will be discharged home.  DVT prophylaxis 40mg  lovenox x4 weeks till ambulating well.   Pain control with PRN pain medication preferring oral medicines.  Follow up plan will be scheduled in approximately 7 days for incision check and XR.  Post-Op Diagnosis: Same Surgeons:Primary: Hiram Gash, MD Assistants:None Location: [facility] ROOM 08 Anesthesia: General Antibiotics: Ancef 2g preop  Tourniquet time: * No tourniquets in log * Estimated Blood Loss: 415 Complications: None Specimens: None Implants: Implant Name Type Inv. Item Serial No. Manufacturer Lot No. LRB No. Used Action  HIP FRAC NAIL LAG SCR 10.5X100 - SN/A Orthopedic Implant HIP FRAC NAIL LAG SCR 10.5X100 N/A [employer](ORTH,TRAU,BIO,SG) [id number] Right 1 Implanted  NAIL HIP FRACT 130D 9X180 - SN/A Orthopedic Implant NAIL HIP FRACT 130D 9X180 N/A [employer](ORTH,TRAU,BIO,SG) [id number] Right 1 Implanted  SCREW BONE CORTICAL 5.0X38 - SN/A Screw SCREW BONE CORTICAL 5.0X38 N/A [employer](ORTH,TRAU,BIO,SG) [id number] Right 1 Implanted    Indications for Surgery:   Hayden [name] is a 78 y.o. male with fall unwitnessed with significant dementia.  Benefits and risks of operative and nonoperative management were discussed prior to surgery with sister, [name], and informed consent form was completed.  Specific risks including infection,  need for additional surgery, periprosthetic fracture, cutout and pain.   Procedure:   The patient was identified properly. Informed consent was obtained and the surgical site was marked. The patient was taken up to suite where general anesthesia was induced.  The patient was positioned supine on HANA table.  The right hip was prepped and draped in the usual sterile fashion.  Timeout was performed before the beginning of the case.  The patient was placed supine on a fracture table and appropriate reduction was obtained and visualized on fluoroscopy prior to the beginning of the procedure.  We made an incision proximal to the greater trochanter and dissected down through the fascia.  We then carefully placed our starting awllocalizing under fluoroscopy prior to advancing the awl into the bone and sliding a ball-tipped guidewire through the awl into the femoral canal.  The wire was passed to an appropriate level past the isthmus.  We selected a length of nail noted above.  Entry reamer was used.  At this point we placed our nail localizing under fluoroscopy that it was at the appropriate level prior to using the outrigger device to pass a wire and then the cephalo-medullary screw.  The screw was locked proximally to avoid over collapse.  We took final shots at the proximal femur and then used the outrigger to place one distal interlock screw.  Final pictures were obtained.  The wounds were thoroughly irrigated closed in a multilayer fashion with absorable sutures.  A sterile dressing was placed.  The patient was awoken from general anesthesia and taken to the PACU in stable condition without complication.

## 2019-02-10 NOTE — ED Triage Notes (Signed)
Transported by [employer] from [facility]-- unwitnessed fall, injuring lower back and right leg. Hx of dementia, AAO x 2 which is baseline.

## 2019-02-10 NOTE — ED Provider Notes (Signed)
[facility] DEPT Provider Note   CSN: [id number] Arrival date & time: 02/10/19  0750     History   Chief Complaint Chief Complaint  Patient presents with  . Fall    HPI Hayden [name] is a 78 y.o. male.     HPI  Level 35 caveat 78 year old male presents from his long-term memory care unit with reports that he was found on the ground with a presumed fall.  He complains of pain in his right hip.  EMS transported him.  They report that he normally walks. I contacted his sister [name] at [phone].  She had no knowledge of the events.  She states that she handles his affairs, but his brother, [name] is his power of attorney.  Past Medical History:  Diagnosis Date  . Candidiasis   . Depression   . Diabetes mellitus   . Enlarged prostate   . Fibromyalgia   . GERD (gastroesophageal reflux disease)    reflux intermittent  . Hyperlipidemia   . 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 Leukemia [facility]) chronic lymphocytic   2008 diagnosed-monitored Dr     Patient Active Problem List   Diagnosis Date Noted  . Acute encephalopathy 07/02/2018  . Dementia with behavioral disturbance ([place]) 05/24/2018  . Pressure injury of skin 05/23/2018  . Cellulitis 04/17/2018  . Cellulitis, scrotum 03/09/2018  . Cellulitis of male genitalia 03/08/2018  . Depression 03/08/2018  . BPH (benign prostatic hyperplasia) 03/08/2018  . Leukocytosis 03/08/2018  . Mild dementia ([place]) 10/23/2017  . Lower urinary tract infectious disease 09/06/2017  . Hyperglycemia 09/06/2017  . ARF (acute renal failure) ([place]) 09/06/2017  . Altered mental status 09/06/2017  . Type 2 diabetes mellitus without complication, with long-term current use of insulin ([place]) 04/02/2017  . 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 Bilateral hand pain 12/09/2016  . 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 ADENOIDECTOMY    . CARPAL TUNNEL RELEASE     rt hand  . CATARACT EXTRACTION, BILATERAL    . CERVICAL FUSION  1998  . CYSTOSCOPY  07/26/2012   Procedure: CYSTOSCOPY;  Surgeon: Ailene Rud, MD;  Location: [facility];  Service: Urology;  Laterality: N/A;  DIAGNOSTIC CYSTO PROSTATE ULTRASOUND      . PROSTATE BIOPSY  07/26/2012   Procedure: PROSTATE BIOPSY;  Surgeon: Ailene Rud, MD;  Location: [facility];  Service: Urology;  Laterality: N/A;  . retinal micro aneurysms    . tonsil    . VASECTOMY    . VITRECTOMY          Home Medications    Prior to Admission medications   Medication Sig Start Date End Date Taking? Authorizing Provider  acetaminophen (TYLENOL) 500 MG tablet Take 500 mg by mouth every 4 (four) hours as needed for fever or headache.    [provider]  aspirin 81 MG tablet Take 81 mg by mouth every morning.     [provider]  carbamazepine (TEGRETOL) 200 MG tablet Take 200-300 mg by mouth See admin instructions. Take 300mg  by mouth in the morning and take 200mg  by mouth at bedtime    [provider]  docusate sodium (COLACE) 100 MG capsule Take 100 mg by mouth daily.     [provider]  donepezil (  ARICEPT) 10 MG tablet Take 1 tablet (10 mg total) by mouth at bedtime. 05/03/18   Cameron Sprang, MD  EPINEPHrine (EPIPEN JR) 0.15 MG/0.3ML injection Inject 0.15 mg into the muscle as needed for anaphylaxis.    [provider]  HYDROcodone-acetaminophen (NORCO/VICODIN) 5-325 MG tablet Take 1 tablet by mouth every 6 (six) hours as needed for moderate pain.    [provider]  insulin lispro (HUMALOG) 100 UNIT/ML KwikPen Inject 6 Units into the skin 3 (three) times daily with meals. 10/11/18   [provider]  LEVEMIR FLEXTOUCH 100 UNIT/ML Pen Inject 36 Units into the skin at bedtime.   07/16/15   [provider]  Lidocaine HCl (ASPERCREME W/LIDOCAINE) 4 % CREA Apply 1 application topically 2 (two) times daily as needed (back pain).    [provider]  lovastatin (MEVACOR) 20 MG tablet Take 20 mg by mouth daily.    [provider]  nystatin cream (MYCOSTATIN) Apply to affected area 2 times daily Patient not taking: Reported on 01/13/2019 04/23/18   [name], PA-C  risperiDONE (RISPERDAL) 1 MG tablet Take 1 mg by mouth 2 (two) times a day. 09/21/18   [provider]  sulfamethoxazole-trimethoprim (BACTRIM DS,SEPTRA DS) 800-160 MG tablet Take 1 tablet by mouth 2 (two) times daily. Patient not taking: Reported on 01/13/2019 07/05/18   Georgette Shell, MD  tamsulosin (FLOMAX) 0.4 MG CAPS capsule Take 0.4 mg by mouth every evening.     [provider]  traZODone (DESYREL) 100 MG tablet Take 200 mg by mouth at bedtime.     [provider]  vitamin B-12 (CYANOCOBALAMIN) 1000 MCG tablet Take 1,000 mcg by mouth every morning.     [provider]  calcium citrate-vitamin D (CITRACAL+D) 315-200 MG-UNIT per tablet Take 1 tablet by mouth daily.  09/04/11  [provider]  terazosin (HYTRIN) 5 MG capsule Take 5 mg by mouth at bedtime.  09/04/11  [provider]    Family History Family History  Problem Relation Age of Onset  . Diabetes Father   . Diabetes Sister   . Diabetes Brother     Social History Social History   Tobacco Use  . Smoking status: Never Smoker  . Smokeless tobacco: Never Used  Substance Use Topics  . Alcohol use: No    Frequency: Never  . Drug use: No     Allergies   Bee venom, Glucophage [metformin], Shellfish allergy, Grass extracts [gramineae pollens], and Tobacco [nicotiana tabacum]   Review of Systems Review of Systems  All other systems reviewed and are negative.    Physical Exam Updated Vital Signs BP (!) 175/75 (BP Location: Right Arm)   Pulse 85   Temp 97.8  F (36.6 C) (Oral)   Resp 18   SpO2 100%   Physical Exam Vitals signs and nursing note reviewed.  Constitutional:      Appearance: Normal appearance.  HENT:     Head: Normocephalic.     Right Ear: External ear normal.     Left Ear: External ear normal.     Nose: Nose normal.     Mouth/Throat:     Mouth: Mucous membranes are moist.  Eyes:     Extraocular Movements: Extraocular movements intact.     Pupils: Pupils are equal, round, and reactive to light.  Neck:     Musculoskeletal: Normal range of motion.  Cardiovascular:     Rate and Rhythm: Normal rate and regular rhythm.  Pulses: Normal pulses.  Pulmonary:     Effort: Pulmonary effort is normal.     Breath sounds: Normal breath sounds.  Abdominal:     General: Abdomen is flat.  Musculoskeletal:        General: Tenderness, deformity and signs of injury present.  Skin:    General: Skin is warm and dry.     Capillary Refill: Capillary refill takes less than 2 seconds.  Neurological:     General: No focal deficit present.     Mental Status: He is alert.     Comments: Patient is oriented to the fact that he is in a hospital but not oriented to date.  He is able to tell me his name and his sister's name. This is consistent with his reports of memory care unit.      ED Treatments / Results  Labs (all labs ordered are listed, but only abnormal results are displayed) Labs Reviewed  SARS CORONAVIRUS 2 ([place] [facility])  CBC WITH DIFFERENTIAL/PLATELET  PROTIME-INR  COMPREHENSIVE METABOLIC PANEL  TYPE AND SCREEN    EKG None  Radiology No results found.  Procedures Procedures (including critical care time)  Medications Ordered in ED Medications  fentaNYL (SUBLIMAZE) injection 50 mcg (has no administration in time range)     Initial Impression / Assessment and Plan / ED Course  I have reviewed the triage vital signs and the nursing notes.  Pertinent labs & imaging  results that were available during my care of the patient were reviewed by me and considered in my medical decision making (see chart for details).    78 year old male history of dementia found on ground today presents with right hip pain.  On evaluation here he has a right intratrochanteric hip fracture.  Patient's care and admission discussed with Dr. Florene Glen.  Dr. Eben Burow call for orthopedic surgery is aware of patient.  He requested patient be maintained n.p.o. as he may operate on him tonight  Final Clinical Impressions(s) / ED Diagnoses   Final diagnoses:  Fall, initial encounter  Closed fracture of right hip, initial encounter [facility])    ED Discharge Orders    None       Pattricia Boss, MD 02/10/19 1214

## 2019-02-11 ENCOUNTER — Other Ambulatory Visit: Payer: Self-pay

## 2019-02-11 ENCOUNTER — Encounter (HOSPITAL_COMMUNITY): Payer: Self-pay | Admitting: Orthopaedic Surgery

## 2019-02-11 DIAGNOSIS — S72001A Fracture of unspecified part of neck of right femur, initial encounter for closed fracture: Secondary | ICD-10-CM

## 2019-02-11 LAB — COMPREHENSIVE METABOLIC PANEL
ALT: 19 U/L (ref 0–44)
AST: 29 U/L (ref 15–41)
Albumin: 3.2 g/dL — ABNORMAL LOW (ref 3.5–5.0)
Alkaline Phosphatase: 54 U/L (ref 38–126)
Anion gap: 9 (ref 5–15)
BUN: 17 mg/dL (ref 8–23)
CO2: 22 mmol/L (ref 22–32)
Calcium: 8.1 mg/dL — ABNORMAL LOW (ref 8.9–10.3)
Chloride: 105 mmol/L (ref 98–111)
Creatinine, Ser: 0.89 mg/dL (ref 0.61–1.24)
GFR calc Af Amer: >60 mL/min (ref >60)
GFR calc non Af Amer: >60 mL/min (ref >60)
Glucose, Bld: 259 mg/dL — ABNORMAL HIGH (ref 70–99)
Potassium: 3.4 mmol/L — ABNORMAL LOW (ref 3.5–5.1)
Sodium: 136 mmol/L (ref 135–145)
Total Bilirubin: 0.9 mg/dL (ref 0.3–1.2)
Total Protein: 5.2 g/dL — ABNORMAL LOW (ref 6.5–8.1)

## 2019-02-11 LAB — CBC
HCT: 29.2 % — ABNORMAL LOW (ref 39.0–52.0)
Hemoglobin: 10 g/dL — ABNORMAL LOW (ref 13.0–17.0)
MCH: 32.8 pg (ref 26.0–34.0)
MCHC: 34.2 g/dL (ref 30.0–36.0)
MCV: 95.7 fL (ref 80.0–100.0)
Platelets: 162 10*3/uL (ref 150–400)
RBC: 3.05 MIL/uL — ABNORMAL LOW (ref 4.22–5.81)
RDW: 12.9 % (ref 11.5–15.5)
WBC: 12.9 10*3/uL — ABNORMAL HIGH (ref 4.0–10.5)
nRBC: 0 % (ref 0.0–0.2)

## 2019-02-11 LAB — GLUCOSE, CAPILLARY
Glucose-Capillary: 253 mg/dL — ABNORMAL HIGH (ref 70–99)
Glucose-Capillary: 272 mg/dL — ABNORMAL HIGH (ref 70–99)
Glucose-Capillary: 273 mg/dL — ABNORMAL HIGH (ref 70–99)
Glucose-Capillary: 146 mg/dL — ABNORMAL HIGH (ref 70–99)

## 2019-02-11 LAB — PROTIME-INR
INR: 1.2 (ref 0.8–1.2)
Prothrombin Time: 15.1 seconds (ref 11.4–15.2)

## 2019-02-11 LAB — APTT: aPTT: 21 seconds — ABNORMAL LOW (ref 24–36)

## 2019-02-11 LAB — HEMOGLOBIN A1C
Hgb A1c MFr Bld: 6.4 % — ABNORMAL HIGH (ref 4.8–5.6)
Mean Plasma Glucose: 136.98 mg/dL

## 2019-02-11 MED ORDER — SODIUM CHLORIDE 0.9 % IV BOLUS
500.0000 mL | Freq: Once | INTRAVENOUS | Status: AC
  Administered 2019-02-11: 500 mL via INTRAVENOUS

## 2019-02-11 MED ORDER — OXYCODONE HCL 5 MG PO TABS: ORAL_TABLET | ORAL | 0 refills | Status: DC

## 2019-02-11 MED ORDER — INSULIN ASPART 100 UNIT/ML ~~LOC~~ SOLN
0.0000 [IU] | Freq: Three times a day (TID) | SUBCUTANEOUS | Status: DC
  Administered 2019-02-11 (×2): 8 [IU] via SUBCUTANEOUS
  Administered 2019-02-12: 17:00:00 5 [IU] via SUBCUTANEOUS
  Administered 2019-02-12: 8 [IU] via SUBCUTANEOUS
  Administered 2019-02-13: 2 [IU] via SUBCUTANEOUS
  Administered 2019-02-14: 5 [IU] via SUBCUTANEOUS
  Administered 2019-02-14: 2 [IU] via SUBCUTANEOUS
  Administered 2019-02-14: 5 [IU] via SUBCUTANEOUS
  Administered 2019-02-15 (×2): 2 [IU] via SUBCUTANEOUS
  Administered 2019-02-15: 09:00:00 3 [IU] via SUBCUTANEOUS
  Administered 2019-02-16: 5 [IU] via SUBCUTANEOUS
  Administered 2019-02-16: 2 [IU] via SUBCUTANEOUS

## 2019-02-11 MED ORDER — ACETAMINOPHEN 500 MG PO TABS: 1000.0000 mg | ORAL_TABLET | Freq: Three times a day (TID) | ORAL | 0 refills | Status: AC

## 2019-02-11 MED ORDER — INSULIN ASPART 100 UNIT/ML ~~LOC~~ SOLN
0.0000 [IU] | Freq: Every day | SUBCUTANEOUS | Status: DC
  Administered 2019-02-13: 2 [IU] via SUBCUTANEOUS
  Administered 2019-02-15: 3 [IU] via SUBCUTANEOUS

## 2019-02-11 MED ORDER — POTASSIUM CHLORIDE CRYS ER 20 MEQ PO TBCR
40.0000 meq | EXTENDED_RELEASE_TABLET | Freq: Once | ORAL | Status: AC
  Administered 2019-02-11: 40 meq via ORAL
  Filled 2019-02-11: qty 2

## 2019-02-11 MED ORDER — ENOXAPARIN SODIUM 40 MG/0.4ML ~~LOC~~ SOLN: 40.0000 mg | SUBCUTANEOUS | 0 refills | Status: DC

## 2019-02-11 NOTE — Discharge Instructions (Signed)
°  Beatrix Breece MD, MPH °[facility] °[address], [address] °[phone] (tel)   °[phone] (fax) ° ° °POST-OPERATIVE INSTRUCTIONS ° ° °WOUND CARE ° °You may shower on Post-Op Day #2. Dressing can be removed and ok to shower and dry dressing can be placed as you see fit if there is any drainage. ° °EXERCISES °Follow the instructions of your therapist.  No specific exercises necessary outside of this. ° ° °FOLLOW-UP °If you develop a Fever (>101.5), Redness or Drainage from the surgical incision site, please call our office to arrange for an evaluation. °Please call the office to schedule a follow-up appointment for your incision check, 10-14 days post-operatively. ° °IF YOU HAVE ANY QUESTIONS, PLEASE FEEL FREE TO CALL OUR OFFICE. ° °HELPFUL INFORMATION °You should wean off your narcotic medicines as soon as you are able.  Most patients will be off or using minimal narcotics before their first postop appointment.  ° °We suggest you use the pain medication the first night prior to going to bed, in order to ease any pain when the anesthesia wears off. You should avoid taking pain medications on an empty stomach as it will make you nauseous. ° °Do not drink alcoholic beverages or take illicit drugs when taking pain medications. ° °Pain medication may make you constipated.  Below are a few solutions to try in this order: °Decrease the amount of pain medication if you aren't having pain. °Drink lots of decaffeinated fluids. °Drink prune juice and/or each dried prunes ° °If the first 3 don't work start with additional solutions °Take Colace - an over-the-counter stool softener °Take Senokot - an over-the-counter laxative °Take Miralax - a stronger over-the-counter laxative ° ° °

## 2019-02-11 NOTE — Evaluation (Signed)
Physical Therapy Evaluation Patient Details Name: Hayden [name] MRN: [id number] DOB: Apr 10, 1941 Today's Date: 02/11/2019   History of Present Illness  78 yo male admitted to ED from [facility] for fall, with resultant LBP and R hip intertrochanteric fracture. Pt s/p R hip IM nailing on 02/10/19. PMH includes dementia, DM, fibromyalgia, leukemia, cellulitis.  Clinical Impression   Pt presents with moderate R hip pain, generalized weakness, impaired cognition secondary to dementia, difficulty performing mobility tasks, and decreased activity tolerance due to R hip pain and weakness. Pt to benefit from acute PT to address deficits. Pt required min-mod assist +2 for mobility this session, and deferred ambulation due to weakness and R hip pain. Pt is a high fall risk and has multiple physical deficits, PT recommending SNF level of care upon d/c. PT to progress mobility as tolerated, and will continue to follow acutely.      Follow Up Recommendations SNF;Supervision/Assistance - 24 hour    Equipment Recommendations  None recommended by PT    Recommendations for Other Services       Precautions / Restrictions Precautions Precautions: Fall Restrictions Weight Bearing Restrictions: No Other Position/Activity Restrictions: WBAT      Mobility  Bed Mobility Overal bed mobility: Needs Assistance Bed Mobility: Supine to Sit     Supine to sit: Mod assist;HOB elevated;+2 for safety/equipment     General bed mobility comments: mod assist +2 for LE management R>L, trunk elevation, scooting to EOB with use of bed pad. Increased time, limited by pain.  Transfers Overall transfer level: Needs assistance Equipment used: Rolling walker (2 wheeled) Transfers: Sit to/from [facility] Sit to Stand: +2 safety/equipment;From elevated surface;Mod assist Stand pivot transfers: Min assist;+2 safety/equipment;From elevated surface       General transfer comment: Mod  assist for power up, steadying, with tactile facilitation for hip extension and trunk extension to neutral. Verbal cuing for hand placement, not followed. Min assist for steadying and RW management when pt turning to chair with small steps.  Ambulation/Gait Ambulation/Gait assistance: (NT - pt fatigued, in pain, and buckling)              Stairs            Wheelchair Mobility    Modified Rankin (Stroke Patients Only)       Balance Overall balance assessment: Needs assistance;History of Falls Sitting-balance support: No upper extremity supported;Feet supported Sitting balance-Leahy Scale: Fair     Standing balance support: Bilateral upper extremity supported Standing balance-Leahy Scale: Poor Standing balance comment: reliant on external support                             Pertinent Vitals/Pain Pain Assessment: Faces Faces Pain Scale: Hurts even more Pain Location: R hip Pain Descriptors / Indicators: Grimacing;Sore Pain Intervention(s): Limited activity within patient's tolerance;Monitored during session;Repositioned    Home Living Family/patient expects to be discharged to:: Assisted living               Home Equipment: Walker - 2 wheels      Prior Function           Comments: Pt states "I walk with a RW but I need a cane!". Pt with history of falls, the one bringing him here pt does not remember.     Hand Dominance   Dominant Hand: Right    Extremity/Trunk Assessment   Upper Extremity Assessment Upper Extremity Assessment: Generalized  weakness    Lower Extremity Assessment Lower Extremity Assessment: Generalized weakness;RLE deficits/detail RLE Deficits / Details: able to perform active-assisted heel slide, AROM quad set, and ankle pumps RLE: Unable to fully assess due to pain    Cervical / Trunk Assessment Cervical / Trunk Assessment: Normal  Communication   Communication: No difficulties  Cognition Arousal/Alertness:  Awake/alert Behavior During Therapy: WFL for tasks assessed/performed Overall Cognitive Status: History of cognitive impairments - at baseline Area of Impairment: Orientation;Memory;Following commands;Safety/judgement;Problem solving                 Orientation Level: Situation   Memory: Decreased short-term memory Following Commands: Follows one step commands with increased time Safety/Judgement: Decreased awareness of safety   Problem Solving: Slow processing;Decreased initiation;Difficulty sequencing;Requires tactile cues;Requires verbal cues General Comments: Pt states he at [place] "to learn how to carry that full basket over there". Pt does not follow safety verbal cues well, especially with RW use.      General Comments      Exercises General Exercises - Lower Extremity Ankle Circles/Pumps: 10 reps;AROM;Both;Seated Quad Sets: Both;10 reps;Seated;AAROM Heel Slides: AAROM;5 reps;Right;Supine   Assessment/Plan    PT Assessment Patient needs continued PT services  PT Problem List Decreased strength;Decreased mobility;Decreased safety awareness;Decreased activity tolerance;Decreased balance;Decreased knowledge of use of DME;Pain;Decreased cognition;Decreased range of motion       PT Treatment Interventions Functional mobility training;Gait training;Therapeutic exercise;Therapeutic activities;Patient/family education;Neuromuscular re-education;Balance training;DME instruction    PT Goals (Current goals can be found in the Care Plan section)  Acute Rehab PT Goals PT Goal Formulation: Patient unable to participate in goal setting Time For Goal Achievement: 02/25/19 Potential to Achieve Goals: Good    Frequency Min 3X/week   Barriers to discharge        Co-evaluation               AM-PAC PT "6 Clicks" Mobility  Outcome Measure Help needed turning from your back to your side while in a flat bed without using bedrails?: A Lot Help needed moving from  lying on your back to sitting on the side of a flat bed without using bedrails?: A Lot Help needed moving to and from a bed to a chair (including a wheelchair)?: A Lot Help needed standing up from a chair using your arms (e.g., wheelchair or bedside chair)?: A Lot Help needed to walk in hospital room?: A Lot Help needed climbing 3-5 steps with a railing? : Total 6 Click Score: 11    End of Session Equipment Utilized During Treatment: Gait belt Activity Tolerance: Patient limited by pain;Patient limited by fatigue Patient left: in chair;with chair alarm set;with call bell/phone within reach;with SCD's reapplied Nurse Communication: Mobility status PT Visit Diagnosis: Muscle weakness (generalized) (M62.81);Unsteadiness on feet (R26.81);History of falling (Z91.81)    Time: 5520-8022 PT Time Calculation (min) (ACUTE ONLY): 18 min   Charges:   PT Evaluation $PT Eval Low Complexity: 1 Low         Lashaunta Sicard Conception Chancy, PT Acute Rehabilitation Services Pager [phone]  Office [phone]   Mikah Poss D Elonda Husky 02/11/2019, 2:32 PM

## 2019-02-11 NOTE — Progress Notes (Addendum)
Inpatient Diabetes Program Recommendations  AACE/ADA: New Consensus Statement on Inpatient Glycemic Control (2015)  Target Ranges:  Prepandial:   less than 140 mg/dL      Peak postprandial:   less than 180 mg/dL (1-2 hours)      Critically ill patients:  140 - 180 mg/dL   Results for KOICHI, PLATTE (MRN [id number]) as of 02/11/2019 08:41  Ref. Range 02/10/2019 16:02 02/10/2019 17:52 02/10/2019 22:40 02/11/2019 07:44  Glucose-Capillary Latest Ref Range: 70 - 99 mg/dL 169 (H) 168 (H) 205 (H)  15 units LEVEMIR 253 (H)    Admit with: R femur fracture after Fall  History: DM, Dementia  Home DM Meds: Levemir 30 units QHS       Humalog 6 units TID  Current Orders: Levemir 15 units QHS      MD- Please consider adding Novolog Sensitive Correction Scale/ SSI (0-9 units) TID AC + HS  May also consider increasing Levemir to 24 units QHS (80% home dose)      --Will follow patient during hospitalization--  Wyn Quaker RN, MSN, CDE Diabetes Coordinator Inpatient Glycemic Control Team Team Pager: [phone] (8a-5p)

## 2019-02-11 NOTE — Progress Notes (Signed)
ORTHOPAEDIC PROGRESS NOTE  s/p Procedure(s): INTRAMEDULLARY (IM) NAIL FEMORAL  R  SUBJECTIVE: Patient very confused this morning and has ripped off his bandages as well as all of his clothing.  Not complaining of any pain however.  OBJECTIVE: Pe: right lower extremity: incision CDI, leg lengths equal, warm well perfused foot, intact EHL/TA/GSC   Vitals:   02/11/19 0337 02/11/19 0600  BP: (!) 96/46 (!) 91/46  Pulse: 83 79  Resp: 20 17  Temp: (!) 97.5 F (36.4 C) 98.4 F (36.9 C)  SpO2: 100% 100%     ASSESSMENT: Hayden [name] is a 78 y.o. male doing well postoperatively.  PLAN: Weightbearing: WBAT RLE  Insicional and dressing care: OK to remove dressings 7 days postop, leave steri strips and leave open to air with dry gauze PRN Orthopedic device(s): none Showering: PRN VTE prophylaxis: Lovenox 40mg  qd x6 weeks, if ambulating well will transition to aspirin in clinic Pain control: PRN meds, minimize narcs Follow - up plan: 2 weeks with XR in clinic Contact information:  Weekdays 8-5 Ophelia Charter MD [phone], After hours and holidays please check [email or web address] for group call information for [facility]

## 2019-02-11 NOTE — Progress Notes (Signed)
PROGRESS NOTE    SALAHUDDIN ARISMENDEZ  FIE:[id number] DOB: 03-Sep-1940 DOA: 02/10/2019 PCP: Patient, No Pcp Per    Brief Narrative:  78 y.o. male with medical history significant of dementia, depression, T2DM, fibromyalgia, and multiple other medical problems presenting after being found down with R femur fracture.    History is limited due to patients mental status.  Obtained from chart, EDP, and Environmental education officer.  Pt unable to relay what happened.  He was found down after unwitnessed fall.  The staff is unable to report much more than this.  Pt complains of R hip pain.  At baseline, pt is able to ambulate.     ED Course: Labs, EKG, imaging.  Orthopedic c/s.  Admit to hospitalist due to R femur fracture.  Assessment & Plan:   Active Problems:   Femur fracture, right ([facility])   Fall  Active Problems:   Femur fracture, right ([facility])  Right Femur Fracture:  -Pt found down prior to admit -Imaging with mildly impacted and angulated intertochanteric fracture of the proximal R femur. -Orthopedic surgery consulted. Pt now s/p R hip surgery 8/13 -Cont analgesic as needed -Therapy recommendations thus far for SNF. Will consult SW  Leukocytosis: likely reactive, improved  Dementia  Depression: continued on aricept, risperdal, carbamazepime, ativan, troazodone Delirium precautions -Stable at present  HLD: lovastatin (pharmacy sub)  T2DM: levemir at 15 units nightly (half dose for now), continue SSI.  Start mealtime as needed. Continue asa/statin as tolerated  BPH: flomax  DVT prophylaxis: Lovenox subq Code Status: DNR Family Communication: Pt in room, family not at bedside Disposition Plan: snf, timing uncertain  Consultants:   Orthopedic surgery  Procedures:   R hip surgery 8/13  Antimicrobials: Anti-infectives (From admission, onward)   Start     Dose/Rate Route Frequency Ordered Stop   02/10/19 2200  ceFAZolin (ANCEF) IVPB 2g/100 mL premix     2 g 200 mL/hr over  30 Minutes Intravenous Every 6 hours 02/10/19 1901 02/11/19 0725   02/10/19 1722  vancomycin (VANCOCIN) powder  Status:  Discontinued       As needed 02/10/19 1723 02/10/19 1743   02/10/19 1600  ceFAZolin (ANCEF) IVPB 2g/100 mL premix     2 g 200 mL/hr over 30 Minutes Intravenous On call to O.R. 02/10/19 1449 02/10/19 1628       Subjective: Asleep, unable to assess  Objective: Vitals:   02/11/19 0337 02/11/19 0600 02/11/19 1002 02/11/19 1401  BP: (!) 96/46 (!) 91/46 (!) 103/47 (!) 102/43  Pulse: 83 79 76 79  Resp: 20 17 17 17   Temp: (!) 97.5 F (36.4 C) 98.4 F (36.9 C) 98.4 F (36.9 C) 98 F (36.7 C)  TempSrc: Oral Oral  Oral  SpO2: 100% 100%  98%    Intake/Output Summary (Last 24 hours) at 02/11/2019 1601 Last data filed at 02/11/2019 1400 Gross per 24 hour  Intake 3560 ml  Output 600 ml  Net 2960 ml   There were no vitals filed for this visit.  Examination:  General exam: Appears calm and comfortable  Respiratory system: Clear to auscultation. Respiratory effort normal. Cardiovascular system: S1 & S2 heard, RRR Gastrointestinal system: Abdomen is nondistended, soft and nontender. No organomegaly or masses felt. Normal bowel sounds heard. Central nervous system: asleep No focal neurological deficits. Extremities: Symmetric 5 x 5 power. Skin: No rashes, lesions  Psychiatry: unable to assess given current mentation  Data Reviewed: I have personally reviewed following labs and imaging studies  CBC: Recent  Labs  Lab 02/10/19 0812 02/10/19 1817 02/11/19 0304  WBC 19.1* 19.5* 12.9*  NEUTROABS 8.4*  --   --   HGB 14.0 12.6* 10.0*  HCT 40.6 37.0* 29.2*  MCV 94.4 95.1 95.7  PLT 200 206 151   Basic Metabolic Panel: Recent Labs  Lab 02/10/19 0812 02/10/19 1817 02/11/19 0304  NA 140  --  136  K 3.8  --  3.4*  CL 104  --  105  CO2 24  --  22  GLUCOSE 98  --  259*  BUN 17  --  17  CREATININE 0.82 0.90 0.89  CALCIUM 9.5  --  8.1*   GFR: CrCl cannot be  calculated (Unknown ideal weight.). Liver Function Tests: Recent Labs  Lab 02/10/19 0812 02/11/19 0304  AST 25 29  ALT 21 19  ALKPHOS 75 54  BILITOT 0.9 0.9  PROT 7.0 5.2*  ALBUMIN 4.5 3.2*   No results for input(s): LIPASE, AMYLASE in the last 168 hours. No results for input(s): AMMONIA in the last 168 hours. Coagulation Profile: Recent Labs  Lab 02/10/19 0812 02/11/19 0304  INR 1.0 1.2   Cardiac Enzymes: No results for input(s): CKTOTAL, CKMB, CKMBINDEX, TROPONINI in the last 168 hours. BNP (last 3 results) No results for input(s): PROBNP in the last 8760 hours. HbA1C: Recent Labs    02/11/19 0304  HGBA1C 6.4*   CBG: Recent Labs  Lab 02/10/19 1752 02/10/19 2240 02/11/19 0744 02/11/19 1157 02/11/19 1550  GLUCAP 168* 205* 253* 272* 273*   Lipid Profile: No results for input(s): CHOL, HDL, LDLCALC, TRIG, CHOLHDL, LDLDIRECT in the last 72 hours. Thyroid Function Tests: No results for input(s): TSH, T4TOTAL, FREET4, T3FREE, THYROIDAB in the last 72 hours. Anemia Panel: No results for input(s): VITAMINB12, FOLATE, FERRITIN, TIBC, IRON, RETICCTPCT in the last 72 hours. Sepsis Labs: No results for input(s): PROCALCITON, LATICACIDVEN in the last 168 hours.  Recent Results (from the past 240 hour(s))  SARS Coronavirus 2 [facility] order, Performed in [facility] hospital lab) Nasopharyngeal Nasopharyngeal Swab     Status: None   Collection Time: 02/10/19  8:12 AM   Specimen: Nasopharyngeal Swab  Result Value Ref Range Status   SARS Coronavirus 2 NEGATIVE NEGATIVE Final    Comment: (NOTE) If result is NEGATIVE SARS-CoV-2 target nucleic acids are NOT DETECTED. The SARS-CoV-2 RNA is generally detectable in upper and lower  respiratory specimens during the acute phase of infection. The lowest  concentration of SARS-CoV-2 viral copies this assay can detect is 250  copies / mL. A negative result does not preclude SARS-CoV-2 infection  and should not be used as the  sole basis for treatment or other  patient management decisions.  A negative result may occur with  improper specimen collection / handling, submission of specimen other  than nasopharyngeal swab, presence of viral mutation(s) within the  areas targeted by this assay, and inadequate number of viral copies  (<250 copies / mL). A negative result must be combined with clinical  observations, patient history, and epidemiological information. If result is POSITIVE SARS-CoV-2 target nucleic acids are DETECTED. The SARS-CoV-2 RNA is generally detectable in upper and lower  respiratory specimens dur ing the acute phase of infection.  Positive  results are indicative of active infection with SARS-CoV-2.  Clinical  correlation with patient history and other diagnostic information is  necessary to determine patient infection status.  Positive results do  not rule out bacterial infection or co-infection with other viruses. If result is  PRESUMPTIVE POSTIVE SARS-CoV-2 nucleic acids MAY BE PRESENT.   A presumptive positive result was obtained on the submitted specimen  and confirmed on repeat testing.  While 2019 novel coronavirus  (SARS-CoV-2) nucleic acids may be present in the submitted sample  additional confirmatory testing may be necessary for epidemiological  and / or clinical management purposes  to differentiate between  SARS-CoV-2 and other Sarbecovirus currently known to infect humans.  If clinically indicated additional testing with an alternate test  methodology [phone]) is advised. The SARS-CoV-2 RNA is generally  detectable in upper and lower respiratory sp ecimens during the acute  phase of infection. The expected result is Negative. Fact Sheet for Patients:  StrictlyIdeas.no Fact Sheet for Healthcare Providers: BankingDealers.co.za This test is not yet approved or cleared by the [place] FDA and has been authorized for detection  and/or diagnosis of SARS-CoV-2 by FDA under an Emergency Use Authorization (EUA).  This EUA will remain in effect (meaning this test can be used) for the duration of the COVID-19 declaration under Section 564(b)(1) of the Act, 21 U.S.C. section 360bbb-3(b)(1), unless the authorization is terminated or revoked sooner. Performed at [facility], [place] [address] [place],  [address]      Radiology Studies: Ct Head Wo Contrast  Result Date: 02/10/2019 CLINICAL DATA:  Unwitnessed fall. EXAM: CT CERVICAL SPINE WITHOUT CONTRAST TECHNIQUE: Multidetector CT imaging of the cervical spine was performed without intravenous contrast. Multiplanar CT image reconstructions were also generated. COMPARISON:  None. FINDINGS: Alignment: Normal Skull base and vertebrae: No evidence of fracture. Chronic fusion at C5-6. 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 Upper chest: Negative Other: None IMPRESSION: No acute or traumatic finding. 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 Electronically Signed   By: [name] Chimes M.D.   On: 02/10/2019 14:46   Ct Cervical Spine Wo Contrast  Result Date: 02/10/2019 CLINICAL DATA:  Unwitnessed fall. EXAM: CT CERVICAL SPINE WITHOUT CONTRAST TECHNIQUE: Multidetector CT imaging of the cervical spine was performed without intravenous contrast. Multiplanar CT image reconstructions were also generated. COMPARISON:  None. FINDINGS: Alignment: Normal Skull base and vertebrae: No evidence of fracture. Chronic fusion at C5-6. 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 Upper chest: Negative Other: None IMPRESSION: No acute or traumatic finding. 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 Electronically Signed   By: [name] Chimes M.D.   On: 02/10/2019 14:46   Dg Chest Port 1 View   Result Date: 02/10/2019 CLINICAL DATA:  Pain after fall. EXAM: PORTABLE CHEST 1 VIEW COMPARISON:  None. FINDINGS: The heart size and mediastinal contours are within normal limits. Both lungs are clear. No pneumothorax or pleural effusion is noted. The visualized skeletal structures are unremarkable. IMPRESSION: No active disease. Aortic Atherosclerosis (ICD10-I70.0). Electronically Signed   By: Marijo Conception M.D.   On: 02/10/2019 10:48   Dg Knee Right Port  Result Date: 02/10/2019 CLINICAL DATA:  Right knee pain EXAM: PORTABLE RIGHT KNEE - 1-2 VIEW COMPARISON:  None. FINDINGS: No acute fracture. No malalignment. Mild medial compartment joint space narrowing. Chondrocalcinosis of the menisci. No knee joint effusion. Small quadriceps tendon enthesophyte. Extensive vascular calcifications. IMPRESSION: No acute osseous abnormality, right knee. Electronically Signed   By: Davina Poke M.D.   On: 02/10/2019 10:49   Dg C-arm 1-60 Min-no Report  Result Date: 02/10/2019 Fluoroscopy was utilized by the requesting physician.  No radiographic interpretation.   Dg Hip Port Unilat With Pelvis 1v Right  Result Date: 02/10/2019 CLINICAL DATA:  Post right hip ORIF. EXAM: DG HIP (WITH OR WITHOUT PELVIS) 1V PORT RIGHT COMPARISON:  Preoperative radiographs earlier this day. FINDINGS: Intramedullary nail with distal locking and trans trochanteric screw fixation fixating intertrochanteric right femur fracture. Fracture is in improved alignment compared to preoperative imaging. Recent postsurgical change includes air and edema in the soft tissues. There are dense vascular calcifications. IMPRESSION: Post ORIF intertrochanteric right femur fracture without immediate postoperative complication. Electronically Signed   By: Keith Rake M.D.   On: 02/10/2019 18:15   Dg Hip Operative Unilat W Or W/o Pelvis Right  Result Date: 02/10/2019 CLINICAL DATA:  Right IM nail EXAM: OPERATIVE RIGHT HIP (WITH PELVIS IF  PERFORMED) 4 VIEWS TECHNIQUE: Fluoroscopic spot image(s) were submitted for interpretation post-operatively. COMPARISON:  Right hip radiographs dated 02/10/2019 FLUOROSCOPY TIME:  51 seconds 8.1 mGy FINDINGS: Intraoperative fluoroscopic images demonstrating in IM nail with dynamic hip screw fixation of an intertrochanteric right hip fracture. Fracture fragments are in near anatomic alignment and position. IMPRESSION: Intraoperative fluoroscopic radiographs during ORIF of the right hip, as above. Electronically Signed   By: Julian Hy M.D.   On: 02/10/2019 19:26   Dg Hip Unilat With Pelvis 2-3 Views Right  Result Date: 02/10/2019 CLINICAL DATA:  Right hip pain EXAM: DG HIP (WITH OR WITHOUT PELVIS) 2-3V RIGHT COMPARISON:  09/09/2013 FINDINGS: Acute intertrochanteric fracture of the proximal right femur with slight impaction and coxa vara alignment. Right hip joint remains aligned without dislocation. Bones are demineralized. No additional fractures are identified. Advanced vascular calcifications. IMPRESSION: Acute mildly impacted and angulated intertrochanteric fracture of the proximal right femur. Electronically Signed   By: Davina Poke M.D.   On: 02/10/2019 10:48    Scheduled Meds: . acetaminophen  1,000 mg Oral Q8H  . aspirin  81 mg Oral Daily  . carbamazepine  200 mg Oral Daily   And  . carbamazepine  300 mg Oral QHS  . celecoxib  200 mg Oral BID  . docusate sodium  100 mg Oral BID  . donepezil  10 mg Oral QHS  . enoxaparin (LOVENOX) injection  40 mg Subcutaneous Q24H  . insulin aspart  0-15 Units Subcutaneous TID WC  . insulin aspart  0-5 Units Subcutaneous QHS  . insulin detemir  15 Units Subcutaneous QHS  . pravastatin  20 mg Oral q1800  . risperiDONE  1 mg Oral BID  . tamsulosin  0.4 mg Oral QPM  . traZODone  200 mg Oral QHS   Continuous Infusions: . lactated ringers 75 mL/hr at 02/10/19 1554  . lactated ringers 100 mL/hr at 02/11/19 0840     LOS: 1 day   Marylu Lund, MD [facility] Pager On Amion  If 7PM-7AM, please contact night-coverage 02/11/2019, 4:01 PM

## 2019-02-12 ENCOUNTER — Other Ambulatory Visit: Payer: Self-pay

## 2019-02-12 LAB — BASIC METABOLIC PANEL
Anion gap: 5 (ref 5–15)
BUN: 21 mg/dL (ref 8–23)
CO2: 25 mmol/L (ref 22–32)
Calcium: 8.2 mg/dL — ABNORMAL LOW (ref 8.9–10.3)
Chloride: 106 mmol/L (ref 98–111)
Creatinine, Ser: 0.76 mg/dL (ref 0.61–1.24)
GFR calc Af Amer: >60 mL/min (ref >60)
GFR calc non Af Amer: >60 mL/min (ref >60)
Glucose, Bld: 207 mg/dL — ABNORMAL HIGH (ref 70–99)
Potassium: 3.9 mmol/L (ref 3.5–5.1)
Sodium: 136 mmol/L (ref 135–145)

## 2019-02-12 LAB — CBC
HCT: 26.3 % — ABNORMAL LOW (ref 39.0–52.0)
Hemoglobin: 8.6 g/dL — ABNORMAL LOW (ref 13.0–17.0)
MCH: 31.9 pg (ref 26.0–34.0)
MCHC: 32.7 g/dL (ref 30.0–36.0)
MCV: 97.4 fL (ref 80.0–100.0)
Platelets: 140 10*3/uL — ABNORMAL LOW (ref 150–400)
RBC: 2.7 MIL/uL — ABNORMAL LOW (ref 4.22–5.81)
RDW: 13 % (ref 11.5–15.5)
WBC: 10.6 10*3/uL — ABNORMAL HIGH (ref 4.0–10.5)
nRBC: 0 % (ref 0.0–0.2)

## 2019-02-12 LAB — GLUCOSE, CAPILLARY
Glucose-Capillary: 193 mg/dL — ABNORMAL HIGH (ref 70–99)
Glucose-Capillary: 278 mg/dL — ABNORMAL HIGH (ref 70–99)
Glucose-Capillary: 225 mg/dL — ABNORMAL HIGH (ref 70–99)
Glucose-Capillary: 236 mg/dL — ABNORMAL HIGH (ref 70–99)
Glucose-Capillary: 199 mg/dL — ABNORMAL HIGH (ref 70–99)

## 2019-02-12 MED ORDER — INSULIN DETEMIR 100 UNIT/ML ~~LOC~~ SOLN
24.0000 [IU] | Freq: Every day | SUBCUTANEOUS | Status: DC
  Administered 2019-02-12 – 2019-02-15 (×4): 24 [IU] via SUBCUTANEOUS
  Filled 2019-02-12 (×5): qty 0.24

## 2019-02-12 NOTE — Progress Notes (Signed)
Orthopedic Trauma Service Progress Note  Patient ID: Hayden [name] MRN: [id number] DOB/AGE: 1941/01/08 78 y.o.  Subjective:  Doing ok  No acute issues   cbg's elevated   ROS As above Objective:   VITALS:   Vitals:   02/11/19 1950 02/11/19 2136 02/12/19 0553 02/12/19 1421  BP:  (!) 154/58 129/61 (!) 118/50  Pulse:  78 77 75  Resp:  18 17 16   Temp:  97.7 F (36.5 C) 98 F (36.7 C) 98.5 F (36.9 C)  TempSrc:  Oral Oral Oral  SpO2:  100% 97% 100%  Weight:   75.6 kg   Height: 5\' 8"  (1.727 m)       Estimated body mass index is 25.34 kg/m as calculated from the following:   Height as of this encounter: 5\' 8"  (1.727 m).   Weight as of this encounter: 75.6 kg.   Intake/Output      08/14 0701 - 08/15 0700 08/15 0701 - 08/16 0700   P.O. 900 1500   I.V. (mL/kg) 800 (10.6) 1226.9 (16.2)   IV Piggyback     Total Intake(mL/kg) 1700 (22.5) 2726.9 (36.1)   Urine (mL/kg/hr) 1700 (0.9) 1150 (1.5)   Blood     Total Output 1700 1150   Net 0 +1576.9          LABS  Results for orders placed or performed during the hospital encounter of 02/10/19 (from the past 24 hour(s))  Glucose, capillary     Status: Abnormal   Collection Time: 02/11/19  9:51 PM  Result Value Ref Range   Glucose-Capillary 146 (H) 70 - 99 mg/dL   Comment 1 Notify RN    Comment 2 Document in Chart   CBC     Status: Abnormal   Collection Time: 02/12/19  2:58 AM  Result Value Ref Range   WBC 10.6 (H) 4.0 - 10.5 K/uL   RBC 2.70 (L) 4.22 - 5.81 MIL/uL   Hemoglobin 8.6 (L) 13.0 - 17.0 g/dL   HCT 26.3 (L) 39.0 - 52.0 %   MCV 97.4 80.0 - 100.0 fL   MCH 31.9 26.0 - 34.0 pg   MCHC 32.7 30.0 - 36.0 g/dL   RDW 13.0 11.5 - 15.5 %   Platelets 140 (L) 150 - 400 K/uL   nRBC 0.0 0.0 - 0.2 %  Basic metabolic panel     Status: Abnormal   Collection Time: 02/12/19  2:58 AM  Result Value Ref Range   Sodium 136 135 - 145 mmol/L   Potassium  3.9 3.5 - 5.1 mmol/L   Chloride 106 98 - 111 mmol/L   CO2 25 22 - 32 mmol/L   Glucose, Bld 207 (H) 70 - 99 mg/dL   BUN 21 8 - 23 mg/dL   Creatinine, Ser 0.76 0.61 - 1.24 mg/dL   Calcium 8.2 (L) 8.9 - 10.3 mg/dL   GFR calc non Af Amer >60 >60 mL/min   GFR calc Af Amer >60 >60 mL/min   Anion gap 5 5 - 15  Glucose, capillary     Status: Abnormal   Collection Time: 02/12/19  7:55 AM  Result Value Ref Range   Glucose-Capillary 193 (H) 70 - 99 mg/dL  Glucose, capillary     Status: Abnormal   Collection Time: 02/12/19 11:40 AM  Result Value Ref  Range   Glucose-Capillary 278 (H) 70 - 99 mg/dL  Glucose, capillary     Status: Abnormal   Collection Time: 02/12/19  4:13 PM  Result Value Ref Range   Glucose-Capillary 225 (H) 70 - 99 mg/dL     PHYSICAL EXAM:   Gen: resting comfortably in bed, NAD, eating  Ext:     Right Lower Extremity   Dressings reinforced  Ext warm   Motor and sensory functions grossly intact  No DCT  + DP pulse   Good perfusion distally   Assessment/Plan: 2 Days Post-Op   Active Problems:   Femur fracture, right ([facility])   Fall   Anti-infectives (From admission, onward)   Start     Dose/Rate Route Frequency Ordered Stop   02/10/19 2200  ceFAZolin (ANCEF) IVPB 2g/100 mL premix     2 g 200 mL/hr over 30 Minutes Intravenous Every 6 hours 02/10/19 1901 02/11/19 0725   02/10/19 1722  vancomycin (VANCOCIN) powder  Status:  Discontinued       As needed 02/10/19 1723 02/10/19 1743   02/10/19 1600  ceFAZolin (ANCEF) IVPB 2g/100 mL premix     2 g 200 mL/hr over 30 Minutes Intravenous On call to O.R. 02/10/19 1449 02/10/19 1628    .  POD/HD#: 2  78 y/o male s/p fall with R hip fracture  - fall   -R hip fracture s/p IMN   WBAT  ROM as tolerated  Up with assistance as much as possible  Dressing changes as needed  Ok to shower   - Pain management:  Minimize narcotics  - ABL anemia/Hemodynamics  Stable  - Medical issues   Per medical team   -  DVT/PE prophylaxis:  lovenox x 6 weeks  - ID:   periop abx completed    - FEN/GI prophylaxis/Foley/Lines:  Carb mod diet   - Dispo:  Stable for SNF from ortho standpoint       Jari Pigg, PA-C [phone] (C) 02/12/2019, 5:20 PM  [facility] [place] [address] [phone] (F)

## 2019-02-12 NOTE — Progress Notes (Signed)
PROGRESS NOTE    Hayden [name]  [id number]:[id number] DOB: Oct 17, 1940 DOA: 02/10/2019 PCP: Patient, No Pcp Per    Brief Narrative:  78 y.o. male with medical history significant of dementia, depression, T2DM, fibromyalgia, and multiple other medical problems presenting after being found down with R femur fracture.    History is limited due to patients mental status.  Obtained from chart, EDP, and Environmental education officer.  Pt unable to relay what happened.  He was found down after unwitnessed fall.  The staff is unable to report much more than this.  Pt complains of R hip pain.  At baseline, pt is able to ambulate.     ED Course: Labs, EKG, imaging.  Orthopedic c/s.  Admit to hospitalist due to R femur fracture.  Assessment & Plan:   Active Problems:   Femur fracture, right ([place])   Fall  Active Problems:   Femur fracture, right ([facility]) Right Femur Fracture:  -Pt found down prior to admit -Imaging with mildly impacted and angulated intertochanteric fracture of the proximal R femur. -Orthopedic surgery consulted. Pt now s/p R hip surgery 8/13 -Cont analgesic as needed -Therapy recommendations thus far for SNF. Have consulted SW, pending  Leukocytosis: likely reactive, improved  Dementia  Depression: continued on aricept, risperdal, carbamazepime, ativan, troazodone Delirium precautions -Remains stable  HLD: continue on lovastatin as tolerated  T2DM: initially continued on levemir at 15 units nightly with SSI Continue asa/statin as tolerated -glucose in the mid-200's. Lantus increased to 24 units per diabetic coordinator recs  BPH: continue with flomax as tolerated  DVT prophylaxis: Lovenox subq Code Status: DNR Family Communication: Pt in room, family not at bedside Disposition Plan: snf, timing uncertain  Consultants:   Orthopedic surgery  Procedures:   R hip surgery 8/13  Antimicrobials: Anti-infectives (From admission, onward)   Start     Dose/Rate Route  Frequency Ordered Stop   02/10/19 2200  ceFAZolin (ANCEF) IVPB 2g/100 mL premix     2 g 200 mL/hr over 30 Minutes Intravenous Every 6 hours 02/10/19 1901 02/11/19 0725   02/10/19 1722  vancomycin (VANCOCIN) powder  Status:  Discontinued       As needed 02/10/19 1723 02/10/19 1743   02/10/19 1600  ceFAZolin (ANCEF) IVPB 2g/100 mL premix     2 g 200 mL/hr over 30 Minutes Intravenous On call to O.R. 02/10/19 1449 02/10/19 1628      Subjective: Without complaints this AM  Objective: Vitals:   02/11/19 1950 02/11/19 2136 02/12/19 0553 02/12/19 1421  BP:  (!) 154/58 129/61 (!) 118/50  Pulse:  78 77 75  Resp:  18 17 16   Temp:  97.7 F (36.5 C) 98 F (36.7 C) 98.5 F (36.9 C)  TempSrc:  Oral Oral Oral  SpO2:  100% 97% 100%  Weight:   75.6 kg   Height: 5\' 8"  (1.727 m)       Intake/Output Summary (Last 24 hours) at 02/12/2019 1545 Last data filed at 02/12/2019 1229 Gross per 24 hour  Intake 2780 ml  Output 2350 ml  Net 430 ml   Filed Weights   02/12/19 0553  Weight: 75.6 kg    Examination: General exam: Awake, laying in bed, in nad Respiratory system: Normal respiratory effort, no wheezing Cardiovascular system: regular rate, s1, s2 Gastrointestinal system: Soft, nondistended, positive BS Central nervous system: CN2-12 grossly intact, strength intact Extremities: Perfused, no clubbing Skin: Normal skin turgor, no notable skin lesions seen Psychiatry: Mood normal // no visual hallucinations  Data Reviewed: I have personally reviewed following labs and imaging studies  CBC: Recent Labs  Lab 02/10/19 0812 02/10/19 1817 02/11/19 0304 02/12/19 0258  WBC 19.1* 19.5* 12.9* 10.6*  NEUTROABS 8.4*  --   --   --   HGB 14.0 12.6* 10.0* 8.6*  HCT 40.6 37.0* 29.2* 26.3*  MCV 94.4 95.1 95.7 97.4  PLT 200 206 162 253*   Basic Metabolic Panel: Recent Labs  Lab 02/10/19 0812 02/10/19 1817 02/11/19 0304 02/12/19 0258  NA 140  --  136 136  K 3.8  --  3.4* 3.9  CL 104   --  105 106  CO2 24  --  22 25  GLUCOSE 98  --  259* 207*  BUN 17  --  17 21  CREATININE 0.82 0.90 0.89 0.76  CALCIUM 9.5  --  8.1* 8.2*   GFR: Estimated Creatinine Clearance: 72.4 mL/min (by C-G formula based on SCr of 0.76 mg/dL). Liver Function Tests: Recent Labs  Lab 02/10/19 0812 02/11/19 0304  AST 25 29  ALT 21 19  ALKPHOS 75 54  BILITOT 0.9 0.9  PROT 7.0 5.2*  ALBUMIN 4.5 3.2*   No results for input(s): LIPASE, AMYLASE in the last 168 hours. No results for input(s): AMMONIA in the last 168 hours. Coagulation Profile: Recent Labs  Lab 02/10/19 0812 02/11/19 0304  INR 1.0 1.2   Cardiac Enzymes: No results for input(s): CKTOTAL, CKMB, CKMBINDEX, TROPONINI in the last 168 hours. BNP (last 3 results) No results for input(s): PROBNP in the last 8760 hours. HbA1C: Recent Labs    02/11/19 0304  HGBA1C 6.4*   CBG: Recent Labs  Lab 02/11/19 1157 02/11/19 1550 02/11/19 2151 02/12/19 0755 02/12/19 1140  GLUCAP 272* 273* 146* 193* 278*   Lipid Profile: No results for input(s): CHOL, HDL, LDLCALC, TRIG, CHOLHDL, LDLDIRECT in the last 72 hours. Thyroid Function Tests: No results for input(s): TSH, T4TOTAL, FREET4, T3FREE, THYROIDAB in the last 72 hours. Anemia Panel: No results for input(s): VITAMINB12, FOLATE, FERRITIN, TIBC, IRON, RETICCTPCT in the last 72 hours. Sepsis Labs: No results for input(s): PROCALCITON, LATICACIDVEN in the last 168 hours.  Recent Results (from the past 240 hour(s))  SARS Coronavirus 2 [facility] order, Performed in [facility] hospital lab) Nasopharyngeal Nasopharyngeal Swab     Status: None   Collection Time: 02/10/19  8:12 AM   Specimen: Nasopharyngeal Swab  Result Value Ref Range Status   SARS Coronavirus 2 NEGATIVE NEGATIVE Final    Comment: (NOTE) If result is NEGATIVE SARS-CoV-2 target nucleic acids are NOT DETECTED. The SARS-CoV-2 RNA is generally detectable in upper and lower  respiratory specimens during the acute phase  of infection. The lowest  concentration of SARS-CoV-2 viral copies this assay can detect is 250  copies / mL. A negative result does not preclude SARS-CoV-2 infection  and should not be used as the sole basis for treatment or other  patient management decisions.  A negative result may occur with  improper specimen collection / handling, submission of specimen other  than nasopharyngeal swab, presence of viral mutation(s) within the  areas targeted by this assay, and inadequate number of viral copies  (<250 copies / mL). A negative result must be combined with clinical  observations, patient history, and epidemiological information. If result is POSITIVE SARS-CoV-2 target nucleic acids are DETECTED. The SARS-CoV-2 RNA is generally detectable in upper and lower  respiratory specimens dur ing the acute phase of infection.  Positive  results are indicative of  active infection with SARS-CoV-2.  Clinical  correlation with patient history and other diagnostic information is  necessary to determine patient infection status.  Positive results do  not rule out bacterial infection or co-infection with other viruses. If result is PRESUMPTIVE POSTIVE SARS-CoV-2 nucleic acids MAY BE PRESENT.   A presumptive positive result was obtained on the submitted specimen  and confirmed on repeat testing.  While 2019 novel coronavirus  (SARS-CoV-2) nucleic acids may be present in the submitted sample  additional confirmatory testing may be necessary for epidemiological  and / or clinical management purposes  to differentiate between  SARS-CoV-2 and other Sarbecovirus currently known to infect humans.  If clinically indicated additional testing with an alternate test  methodology [phone]) is advised. The SARS-CoV-2 RNA is generally  detectable in upper and lower respiratory sp ecimens during the acute  phase of infection. The expected result is Negative. Fact Sheet for Patients:   StrictlyIdeas.no Fact Sheet for Healthcare Providers: BankingDealers.co.za This test is not yet approved or cleared by the [place] FDA and has been authorized for detection and/or diagnosis of SARS-CoV-2 by FDA under an Emergency Use Authorization (EUA).  This EUA will remain in effect (meaning this test can be used) for the duration of the COVID-19 declaration under Section 564(b)(1) of the Act, 21 U.S.C. section 360bbb-3(b)(1), unless the authorization is terminated or revoked sooner. Performed at [facility], [place] [address] [place], [place] [address]      Radiology Studies: Dg C-arm 1-60 Min-no Report  Result Date: 02/10/2019 Fluoroscopy was utilized by the requesting physician.  No radiographic interpretation.   Dg Hip Port Unilat With Pelvis 1v Right  Result Date: 02/10/2019 CLINICAL DATA:  Post right hip ORIF. EXAM: DG HIP (WITH OR WITHOUT PELVIS) 1V PORT RIGHT COMPARISON:  Preoperative radiographs earlier this day. FINDINGS: Intramedullary nail with distal locking and trans trochanteric screw fixation fixating intertrochanteric right femur fracture. Fracture is in improved alignment compared to preoperative imaging. Recent postsurgical change includes air and edema in the soft tissues. There are dense vascular calcifications. IMPRESSION: Post ORIF intertrochanteric right femur fracture without immediate postoperative complication. Electronically Signed   By: Keith Rake M.D.   On: 02/10/2019 18:15   Dg Hip Operative Unilat W Or W/o Pelvis Right  Result Date: 02/10/2019 CLINICAL DATA:  Right IM nail EXAM: OPERATIVE RIGHT HIP (WITH PELVIS IF PERFORMED) 4 VIEWS TECHNIQUE: Fluoroscopic spot image(s) were submitted for interpretation post-operatively. COMPARISON:  Right hip radiographs dated 02/10/2019 FLUOROSCOPY TIME:  51 seconds 8.1 mGy FINDINGS: Intraoperative fluoroscopic images demonstrating in IM nail  with dynamic hip screw fixation of an intertrochanteric right hip fracture. Fracture fragments are in near anatomic alignment and position. IMPRESSION: Intraoperative fluoroscopic radiographs during ORIF of the right hip, as above. Electronically Signed   By: Julian Hy M.D.   On: 02/10/2019 19:26    Scheduled Meds: . acetaminophen  1,000 mg Oral Q8H  . aspirin  81 mg Oral Daily  . carbamazepine  200 mg Oral Daily   And  . carbamazepine  300 mg Oral QHS  . celecoxib  200 mg Oral BID  . docusate sodium  100 mg Oral BID  . donepezil  10 mg Oral QHS  . enoxaparin (LOVENOX) injection  40 mg Subcutaneous Q24H  . insulin aspart  0-15 Units Subcutaneous TID WC  . insulin aspart  0-5 Units Subcutaneous QHS  . insulin detemir  24 Units Subcutaneous QHS  . pravastatin  20 mg Oral q1800  .  risperiDONE  1 mg Oral BID  . tamsulosin  0.4 mg Oral QPM  . traZODone  200 mg Oral QHS   Continuous Infusions: . lactated ringers 75 mL/hr at 02/10/19 1554  . lactated ringers 100 mL/hr at 02/12/19 1000     LOS: 2 days   Marylu Lund, MD [facility] Pager On Amion  If 7PM-7AM, please contact night-coverage 02/12/2019, 3:45 PM

## 2019-02-12 NOTE — Plan of Care (Signed)
  Problem: Pain Management: Goal: Pain level will decrease Outcome: Progressing   Problem: Health Behavior/Discharge Planning: Goal: Ability to manage health-related needs will improve Outcome: Progressing   Problem: Clinical Measurements: Goal: Ability to maintain clinical measurements within normal limits will improve Outcome: Progressing   Problem: Activity: Goal: Risk for activity intolerance will decrease Outcome: Progressing   Problem: Nutrition: Goal: Adequate nutrition will be maintained Outcome: Progressing   Problem: Coping: Goal: Level of anxiety will decrease Outcome: Progressing

## 2019-02-12 NOTE — Plan of Care (Signed)
  Problem: Pain Management: Goal: Pain level will decrease Outcome: Progressing   Problem: Clinical Measurements: Goal: Postoperative complications will be avoided or minimized Outcome: Progressing   Problem: Activity: Goal: Ability to ambulate and perform ADLs will improve Outcome: Progressing   Problem: Education: Goal: Verbalization of understanding the information provided (i.e., activity precautions, restrictions, etc) will improve Outcome: Progressing

## 2019-02-13 LAB — GLUCOSE, CAPILLARY
Glucose-Capillary: 144 mg/dL — ABNORMAL HIGH (ref 70–99)
Glucose-Capillary: 114 mg/dL — ABNORMAL HIGH (ref 70–99)
Glucose-Capillary: 140 mg/dL — ABNORMAL HIGH (ref 70–99)
Glucose-Capillary: 208 mg/dL — ABNORMAL HIGH (ref 70–99)

## 2019-02-13 NOTE — Plan of Care (Signed)
  Problem: Pain Management: Goal: Pain level will decrease Outcome: Progressing   Problem: Activity: Goal: Risk for activity intolerance will decrease Outcome: Progressing   Problem: Nutrition: Goal: Adequate nutrition will be maintained Outcome: Progressing   Problem: Clinical Measurements: Goal: Cardiovascular complication will be avoided Outcome: Progressing

## 2019-02-13 NOTE — Progress Notes (Signed)
PROGRESS NOTE    Hayden [name]  UXL:[id number] DOB: [birth year] DOA: 02/10/2019 PCP: Patient, No Pcp Per    Brief Narrative:  78 y.o. male with medical history significant of dementia, depression, T2DM, fibromyalgia, and multiple other medical problems presenting after being found down with R femur fracture.    History is limited due to patients mental status.  Obtained from chart, EDP, and Environmental education officer.  Pt unable to relay what happened.  He was found down after unwitnessed fall.  The staff is unable to report much more than this.  Pt complains of R hip pain.  At baseline, pt is able to ambulate.     ED Course: Labs, EKG, imaging.  Orthopedic c/s.  Admit to hospitalist due to R femur fracture.  Assessment & Plan:   Active Problems:   Femur fracture, right ([place])   Fall  Active Problems:   Femur fracture, right ([facility]) Right Femur Fracture:  -Pt found down prior to admit -Imaging with mildly impacted and angulated intertochanteric fracture of the proximal R femur. -Orthopedic surgery consulted. Pt now s/p R hip surgery 8/13 -Cont analgesic as needed -SW following, will follow. Cont with PT as tolerated  Leukocytosis: likely reactive, improved  Dementia  Depression: continued on aricept, risperdal, carbamazepime, ativan, troazodone Delirium precautions -stable currently  HLD: continue on lovastatin as tolerated  T2DM: initially continued on levemir at 15 units nightly with SSI Continue asa/statin as tolerated -glucose improved. Will cont to titrate insulin as needed  BPH:  -continue with flomax as tolerated -Stable  DVT prophylaxis: Lovenox subq Code Status: DNR Family Communication: Pt in room, family not at bedside Disposition Plan: snf, timing uncertain  Consultants:   Orthopedic surgery  Procedures:   R hip surgery 8/13  Antimicrobials: Anti-infectives (From admission, onward)   Start     Dose/Rate Route Frequency Ordered Stop   02/10/19  2200  ceFAZolin (ANCEF) IVPB 2g/100 mL premix     2 g 200 mL/hr over 30 Minutes Intravenous Every 6 hours 02/10/19 1901 02/11/19 0725   02/10/19 1722  vancomycin (VANCOCIN) powder  Status:  Discontinued       As needed 02/10/19 1723 02/10/19 1743   02/10/19 1600  ceFAZolin (ANCEF) IVPB 2g/100 mL premix     2 g 200 mL/hr over 30 Minutes Intravenous On call to O.R. 02/10/19 1449 02/10/19 1628      Subjective: No complaints this AM  Objective: Vitals:   02/12/19 1421 02/12/19 2126 02/13/19 0349 02/13/19 0500  BP: (!) 118/50 (!) 158/73 134/65   Pulse: 75 87 76   Resp: 16  16   Temp: 98.5 F (36.9 C) 98.5 F (36.9 C) 98 F (36.7 C)   TempSrc: Oral Oral Oral   SpO2: 100% 99% 99%   Weight:    76 kg  Height:        Intake/Output Summary (Last 24 hours) at 02/13/2019 1148 Last data filed at 02/13/2019 0734 Gross per 24 hour  Intake 1846.91 ml  Output 2350 ml  Net -503.09 ml   Filed Weights   02/12/19 0553 02/13/19 0500  Weight: 75.6 kg 76 kg    Examination: General exam: Conversant, in no acute distress Respiratory system: normal chest rise, clear, no audible wheezing Cardiovascular system: regular rhythm, s1-s2 Gastrointestinal system: Nondistended, nontender, pos BS Central nervous system: No seizures, no tremors Extremities: No cyanosis, no joint deformities Skin: No rashes, no pallor Psychiatry: Affect normal // no auditory hallucinations   Data Reviewed: I have  personally reviewed following labs and imaging studies  CBC: Recent Labs  Lab 02/10/19 0812 02/10/19 1817 02/11/19 0304 02/12/19 0258  WBC 19.1* 19.5* 12.9* 10.6*  NEUTROABS 8.4*  --   --   --   HGB 14.0 12.6* 10.0* 8.6*  HCT 40.6 37.0* 29.2* 26.3*  MCV 94.4 95.1 95.7 97.4  PLT 200 206 162 086*   Basic Metabolic Panel: Recent Labs  Lab 02/10/19 0812 02/10/19 1817 02/11/19 0304 02/12/19 0258  NA 140  --  136 136  K 3.8  --  3.4* 3.9  CL 104  --  105 106  CO2 24  --  22 25  GLUCOSE 98  --   259* 207*  BUN 17  --  17 21  CREATININE 0.82 0.90 0.89 0.76  CALCIUM 9.5  --  8.1* 8.2*   GFR: Estimated Creatinine Clearance: 72.4 mL/min (by C-G formula based on SCr of 0.76 mg/dL). Liver Function Tests: Recent Labs  Lab 02/10/19 0812 02/11/19 0304  AST 25 29  ALT 21 19  ALKPHOS 75 54  BILITOT 0.9 0.9  PROT 7.0 5.2*  ALBUMIN 4.5 3.2*   No results for input(s): LIPASE, AMYLASE in the last 168 hours. No results for input(s): AMMONIA in the last 168 hours. Coagulation Profile: Recent Labs  Lab 02/10/19 0812 02/11/19 0304  INR 1.0 1.2   Cardiac Enzymes: No results for input(s): CKTOTAL, CKMB, CKMBINDEX, TROPONINI in the last 168 hours. BNP (last 3 results) No results for input(s): PROBNP in the last 8760 hours. HbA1C: Recent Labs    02/11/19 0304  HGBA1C 6.4*   CBG: Recent Labs  Lab 02/12/19 1613 02/12/19 2007 02/12/19 2247 02/13/19 0734 02/13/19 1135  GLUCAP 225* 236* 199* 144* 114*   Lipid Profile: No results for input(s): CHOL, HDL, LDLCALC, TRIG, CHOLHDL, LDLDIRECT in the last 72 hours. Thyroid Function Tests: No results for input(s): TSH, T4TOTAL, FREET4, T3FREE, THYROIDAB in the last 72 hours. Anemia Panel: No results for input(s): VITAMINB12, FOLATE, FERRITIN, TIBC, IRON, RETICCTPCT in the last 72 hours. Sepsis Labs: No results for input(s): PROCALCITON, LATICACIDVEN in the last 168 hours.  Recent Results (from the past 240 hour(s))  SARS Coronavirus 2 [facility] order, Performed in [facility] hospital lab) Nasopharyngeal Nasopharyngeal Swab     Status: None   Collection Time: 02/10/19  8:12 AM   Specimen: Nasopharyngeal Swab  Result Value Ref Range Status   SARS Coronavirus 2 NEGATIVE NEGATIVE Final    Comment: (NOTE) If result is NEGATIVE SARS-CoV-2 target nucleic acids are NOT DETECTED. The SARS-CoV-2 RNA is generally detectable in upper and lower  respiratory specimens during the acute phase of infection. The lowest  concentration of  SARS-CoV-2 viral copies this assay can detect is 250  copies / mL. A negative result does not preclude SARS-CoV-2 infection  and should not be used as the sole basis for treatment or other  patient management decisions.  A negative result may occur with  improper specimen collection / handling, submission of specimen other  than nasopharyngeal swab, presence of viral mutation(s) within the  areas targeted by this assay, and inadequate number of viral copies  (<250 copies / mL). A negative result must be combined with clinical  observations, patient history, and epidemiological information. If result is POSITIVE SARS-CoV-2 target nucleic acids are DETECTED. The SARS-CoV-2 RNA is generally detectable in upper and lower  respiratory specimens dur ing the acute phase of infection.  Positive  results are indicative of active infection with SARS-CoV-2.  Clinical  correlation with patient history and other diagnostic information is  necessary to determine patient infection status.  Positive results do  not rule out bacterial infection or co-infection with other viruses. If result is PRESUMPTIVE POSTIVE SARS-CoV-2 nucleic acids MAY BE PRESENT.   A presumptive positive result was obtained on the submitted specimen  and confirmed on repeat testing.  While 2019 novel coronavirus  (SARS-CoV-2) nucleic acids may be present in the submitted sample  additional confirmatory testing may be necessary for epidemiological  and / or clinical management purposes  to differentiate between  SARS-CoV-2 and other Sarbecovirus currently known to infect humans.  If clinically indicated additional testing with an alternate test  methodology [phone]) is advised. The SARS-CoV-2 RNA is generally  detectable in upper and lower respiratory sp ecimens during the acute  phase of infection. The expected result is Negative. Fact Sheet for Patients:  StrictlyIdeas.no Fact Sheet for Healthcare  Providers: BankingDealers.co.za This test is not yet approved or cleared by the [place] FDA and has been authorized for detection and/or diagnosis of SARS-CoV-2 by FDA under an Emergency Use Authorization (EUA).  This EUA will remain in effect (meaning this test can be used) for the duration of the COVID-19 declaration under Section 564(b)(1) of the Act, 21 U.S.C. section 360bbb-3(b)(1), unless the authorization is terminated or revoked sooner. Performed at [facility], [place] [address] [place],  [address]      Radiology Studies: No results found.  Scheduled Meds: . acetaminophen  1,000 mg Oral Q8H  . aspirin  81 mg Oral Daily  . carbamazepine  200 mg Oral Daily   And  . carbamazepine  300 mg Oral QHS  . celecoxib  200 mg Oral BID  . docusate sodium  100 mg Oral BID  . donepezil  10 mg Oral QHS  . enoxaparin (LOVENOX) injection  40 mg Subcutaneous Q24H  . insulin aspart  0-15 Units Subcutaneous TID WC  . insulin aspart  0-5 Units Subcutaneous QHS  . insulin detemir  24 Units Subcutaneous QHS  . pravastatin  20 mg Oral q1800  . risperiDONE  1 mg Oral BID  . tamsulosin  0.4 mg Oral QPM  . traZODone  200 mg Oral QHS   Continuous Infusions: . lactated ringers 20 mL/hr at 02/12/19 1610     LOS: 3 days   Marylu Lund, MD [facility] Pager On Amion  If 7PM-7AM, please contact night-coverage 02/13/2019, 11:48 AM

## 2019-02-13 NOTE — Plan of Care (Signed)
  Problem: Pain Management: Goal: Pain level will decrease Outcome: Progressing   Problem: Clinical Measurements: Goal: Postoperative complications will be avoided or minimized Outcome: Progressing   Problem: Clinical Measurements: Goal: Will remain free from infection Outcome: Progressing

## 2019-02-14 LAB — GLUCOSE, CAPILLARY
Glucose-Capillary: 121 mg/dL — ABNORMAL HIGH (ref 70–99)
Glucose-Capillary: 215 mg/dL — ABNORMAL HIGH (ref 70–99)
Glucose-Capillary: 206 mg/dL — ABNORMAL HIGH (ref 70–99)
Glucose-Capillary: 141 mg/dL — ABNORMAL HIGH (ref 70–99)

## 2019-02-14 NOTE — Progress Notes (Signed)
Hayden [name]  WEX:[id number] DOB: Jul 25, 1940 DOA: 02/10/2019 PCP: Patient, No Pcp Per    Brief Narrative:  78 y.o. male with medical history significant of dementia, depression, T2DM, fibromyalgia, and multiple other medical problems presenting after being found down with R femur fracture.    History is limited due to patients mental status.  Obtained from chart, EDP, and Environmental education officer.  Pt unable to relay what happened.  He was found down after unwitnessed fall.  The staff is unable to report much more than this.  Pt complains of R hip pain.  At baseline, pt is able to ambulate.     ED Course: Labs, EKG, imaging.  Orthopedic c/s.  Admit to hospitalist due to R femur fracture.  Assessment & Plan:   Active Problems:   Femur fracture, right ([place])   Fall  Active Problems:   Femur fracture, right (HCC) Right Femur Fracture:  -Pt found down prior to admit -Imaging with mildly impacted and angulated intertochanteric fracture of the proximal R femur. -Orthopedic surgery consulted. Pt now s/p R hip surgery 8/13 -Cont analgesic as needed, seems comfortable at this time -SW following, will follow. Continuing with PT as tolerated  Leukocytosis: likely reactive, improved  Dementia  Depression: continued on aricept, risperdal, carbamazepime, ativan, troazodone Delirium precautions -stable currently  HLD: continue on lovastatin as tolerated  T2DM: initially continued on levemir at 15 units nightly with SSI Continue asa/statin as tolerated -glucose improved. Will cont to titrate insulin as needed  BPH:  -continue with flomax as tolerated -Stable  DVT prophylaxis: Lovenox subq Code Status: DNR Family Communication: Pt in room, family not at bedside Disposition Plan: snf, timing uncertain  Consultants:   Orthopedic surgery  Procedures:   R hip surgery 8/13  Antimicrobials: Anti-infectives (From admission, onward)   Start     Dose/Rate  Route Frequency Ordered Stop   02/10/19 2200  ceFAZolin (ANCEF) IVPB 2g/100 mL premix     2 g 200 mL/hr over 30 Minutes Intravenous Every 6 hours 02/10/19 1901 02/11/19 0725   02/10/19 1722  vancomycin (VANCOCIN) powder  Status:  Discontinued       As needed 02/10/19 1723 02/10/19 1743   02/10/19 1600  ceFAZolin (ANCEF) IVPB 2g/100 mL premix     2 g 200 mL/hr over 30 Minutes Intravenous On call to O.R. 02/10/19 1449 02/10/19 1628      Subjective: Without complaints at this time  Objective: Vitals:   02/13/19 1358 02/13/19 2213 02/14/19 0500 02/14/19 0604  BP: 135/64 (!) 174/77  138/63  Pulse: 86 99  77  Resp: 18 18  14   Temp: 98.4 F (36.9 C) 99 F (37.2 C)  98.2 F (36.8 C)  TempSrc: Oral Oral  Oral  SpO2: 98% 98%  99%  Weight:   76.4 kg   Height:        Intake/Output Summary (Last 24 hours) at 02/14/2019 1322 Last data filed at 02/14/2019 1248 Gross per 24 hour  Intake 900 ml  Output 2850 ml  Net -1950 ml   Filed Weights   02/12/19 0553 02/13/19 0500 02/14/19 0500  Weight: 75.6 kg 76 kg 76.4 kg    Examination: General exam: Awake, laying in bed, in nad Respiratory system: Normal respiratory effort, no wheezing  Data Reviewed: I have personally reviewed following labs and imaging studies  CBC: Recent Labs  Lab 02/10/19 0812 02/10/19 1817 02/11/19 0304 02/12/19 0258  WBC 19.1* 19.5* 12.9* 10.6*  NEUTROABS  8.4*  --   --   --   HGB 14.0 12.6* 10.0* 8.6*  HCT 40.6 37.0* 29.2* 26.3*  MCV 94.4 95.1 95.7 97.4  PLT 200 206 162 528*   Basic Metabolic Panel: Recent Labs  Lab 02/10/19 0812 02/10/19 1817 02/11/19 0304 02/12/19 0258  NA 140  --  136 136  K 3.8  --  3.4* 3.9  CL 104  --  105 106  CO2 24  --  22 25  GLUCOSE 98  --  259* 207*  BUN 17  --  17 21  CREATININE 0.82 0.90 0.89 0.76  CALCIUM 9.5  --  8.1* 8.2*   GFR: Estimated Creatinine Clearance: 72.4 mL/min (by C-G formula based on SCr of 0.76 mg/dL). Liver Function Tests: Recent Labs   Lab 02/10/19 0812 02/11/19 0304  AST 25 29  ALT 21 19  ALKPHOS 75 54  BILITOT 0.9 0.9  PROT 7.0 5.2*  ALBUMIN 4.5 3.2*   No results for input(s): LIPASE, AMYLASE in the last 168 hours. No results for input(s): AMMONIA in the last 168 hours. Coagulation Profile: Recent Labs  Lab 02/10/19 0812 02/11/19 0304  INR 1.0 1.2   Cardiac Enzymes: No results for input(s): CKTOTAL, CKMB, CKMBINDEX, TROPONINI in the last 168 hours. BNP (last 3 results) No results for input(s): PROBNP in the last 8760 hours. HbA1C: No results for input(s): HGBA1C in the last 72 hours. CBG: Recent Labs  Lab 02/13/19 1135 02/13/19 1623 02/13/19 2210 02/14/19 0737 02/14/19 1144  GLUCAP 114* 140* 208* 121* 215*   Lipid Profile: No results for input(s): CHOL, HDL, LDLCALC, TRIG, CHOLHDL, LDLDIRECT in the last 72 hours. Thyroid Function Tests: No results for input(s): TSH, T4TOTAL, FREET4, T3FREE, THYROIDAB in the last 72 hours. Anemia Panel: No results for input(s): VITAMINB12, FOLATE, FERRITIN, TIBC, IRON, RETICCTPCT in the last 72 hours. Sepsis Labs: No results for input(s): PROCALCITON, LATICACIDVEN in the last 168 hours.  Recent Results (from the past 240 hour(s))  SARS Coronavirus 2 [facility] order, Performed in [facility] hospital lab) Nasopharyngeal Nasopharyngeal Swab     Status: None   Collection Time: 02/10/19  8:12 AM   Specimen: Nasopharyngeal Swab  Result Value Ref Range Status   SARS Coronavirus 2 NEGATIVE NEGATIVE Final    Comment: (NOTE) If result is NEGATIVE SARS-CoV-2 target nucleic acids are NOT DETECTED. The SARS-CoV-2 RNA is generally detectable in upper and lower  respiratory specimens during the acute phase of infection. The lowest  concentration of SARS-CoV-2 viral copies this assay can detect is 250  copies / mL. A negative result does not preclude SARS-CoV-2 infection  and should not be used as the sole basis for treatment or other  patient management decisions.  A  negative result may occur with  improper specimen collection / handling, submission of specimen other  than nasopharyngeal swab, presence of viral mutation(s) within the  areas targeted by this assay, and inadequate number of viral copies  (<250 copies / mL). A negative result must be combined with clinical  observations, patient history, and epidemiological information. If result is POSITIVE SARS-CoV-2 target nucleic acids are DETECTED. The SARS-CoV-2 RNA is generally detectable in upper and lower  respiratory specimens dur ing the acute phase of infection.  Positive  results are indicative of active infection with SARS-CoV-2.  Clinical  correlation with patient history and other diagnostic information is  necessary to determine patient infection status.  Positive results do  not rule out bacterial infection or co-infection  with other viruses. If result is PRESUMPTIVE POSTIVE SARS-CoV-2 nucleic acids MAY BE PRESENT.   A presumptive positive result was obtained on the submitted specimen  and confirmed on repeat testing.  While 2019 novel coronavirus  (SARS-CoV-2) nucleic acids may be present in the submitted sample  additional confirmatory testing may be necessary for epidemiological  and / or clinical management purposes  to differentiate between  SARS-CoV-2 and other Sarbecovirus currently known to infect humans.  If clinically indicated additional testing with an alternate test  methodology [phone]) is advised. The SARS-CoV-2 RNA is generally  detectable in upper and lower respiratory sp ecimens during the acute  phase of infection. The expected result is Negative. Fact Sheet for Patients:  StrictlyIdeas.no Fact Sheet for Healthcare Providers: BankingDealers.co.za This test is not yet approved or cleared by the [place] FDA and has been authorized for detection and/or diagnosis of SARS-CoV-2 by FDA under an Emergency Use  Authorization (EUA).  This EUA will remain in effect (meaning this test can be used) for the duration of the COVID-19 declaration under Section 564(b)(1) of the Act, 21 U.S.C. section 360bbb-3(b)(1), unless the authorization is terminated or revoked sooner. Performed at [facility], [place] [address], [place], [place] [address]      Radiology Studies: No results found.  Scheduled Meds: . acetaminophen  1,000 mg Oral Q8H  . aspirin  81 mg Oral Daily  . carbamazepine  200 mg Oral Daily   And  . carbamazepine  300 mg Oral QHS  . celecoxib  200 mg Oral BID  . docusate sodium  100 mg Oral BID  . donepezil  10 mg Oral QHS  . enoxaparin (LOVENOX) injection  40 mg Subcutaneous Q24H  . insulin aspart  0-15 Units Subcutaneous TID WC  . insulin aspart  0-5 Units Subcutaneous QHS  . insulin detemir  24 Units Subcutaneous QHS  . pravastatin  20 mg Oral q1800  . risperiDONE  1 mg Oral BID  . tamsulosin  0.4 mg Oral QPM  . traZODone  200 mg Oral QHS   Continuous Infusions: . lactated ringers 20 mL/hr at 02/12/19 1610     LOS: 4 days   Marylu Lund, MD [facility] Pager On Amion  If 7PM-7AM, please contact night-coverage 02/14/2019, 1:22 PM

## 2019-02-14 NOTE — TOC Initial Note (Signed)
Transition of Care [facility]) - Initial/Assessment Note    Patient Details  Name: Hayden [name] MRN: [id number] Date of Birth: 1940/08/22  Transition of Care [facility]) CM/SW Contact:    Leeroy Cha, RN Phone Number: 02/14/2019, 2:32 PM  Clinical Narrative:                 Patient resident of [facility] fell sustaining a  fractured rt femur.  Will need snf for recovery.  Fl2 completed and faxed out to area snf's.        Patient Goals and CMS Choice        Expected Discharge Plan and Services                                                Prior Living Arrangements/Services                       Activities of Daily Living Home Assistive Devices/Equipment: Other (Comment)(Unknown) ADL Screening (condition at time of admission) Patient's cognitive ability adequate to safely complete daily activities?: No(unknown) Is the patient deaf or have difficulty hearing?: No Does the patient have difficulty seeing, even when wearing glasses/contacts?: No Does the patient have difficulty concentrating, remembering, or making decisions?: No Patient able to express need for assistance with ADLs?: Yes Does the patient have difficulty dressing or bathing?: Yes Independently performs ADLs?: No Communication: Independent Dressing (OT): Needs assistance Is this a change from baseline?: Pre-admission baseline Grooming: Needs assistance Is this a change from baseline?: Pre-admission baseline Feeding: Needs assistance Is this a change from baseline?: Pre-admission baseline Bathing: Needs assistance Is this a change from baseline?: Pre-admission baseline Toileting: Needs assistance Is this a change from baseline?: Pre-admission baseline In/Out Bed: Needs assistance Is this a change from baseline?: Pre-admission baseline Walks in Home: Needs assistance Does the patient have difficulty walking or climbing stairs?: Yes Weakness of Legs: Both Weakness of Arms/Hands:  Both  Permission Sought/Granted                  Emotional Assessment              Admission diagnosis:  Fall, initial encounter [W19.XXXA] Closed fracture of right hip, initial encounter [facility]) [S72.001A] Patient Active Problem List   Diagnosis Date Noted  . Femur fracture, right ([place]) 02/10/2019  . Fall   . Acute encephalopathy 07/02/2018  . Dementia with behavioral disturbance ([place]) 05/24/2018  . Pressure injury of skin 05/23/2018  . Cellulitis 04/17/2018  . Cellulitis, scrotum 03/09/2018  . Cellulitis of male genitalia 03/08/2018  . Depression 03/08/2018  . BPH (benign prostatic hyperplasia) 03/08/2018  . Leukocytosis 03/08/2018  . Mild dementia ([place]) 10/23/2017  . Lower urinary tract infectious disease 09/06/2017  . Hyperglycemia 09/06/2017  . ARF (acute renal failure) ([place]) 09/06/2017  . Altered mental status 09/06/2017  . Type 2 diabetes mellitus without complication, with long-term current use of insulin ([place]) 04/02/2017  . 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 Bilateral hand pain 12/09/2016  . 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

## 2019-02-14 NOTE — Care Management Important Message (Signed)
Important Message  Patient Details IM Letter given to Velva Harman RN to present to the Patient Name: Hayden [name] MRN: [id number] Date of Birth: 16-Jun-1941   Medicare Important Message Given:  Yes     Kerin Salen 02/14/2019, 1:43 PM

## 2019-02-15 LAB — GLUCOSE, CAPILLARY
Glucose-Capillary: 153 mg/dL — ABNORMAL HIGH (ref 70–99)
Glucose-Capillary: 150 mg/dL — ABNORMAL HIGH (ref 70–99)
Glucose-Capillary: 142 mg/dL — ABNORMAL HIGH (ref 70–99)
Glucose-Capillary: 264 mg/dL — ABNORMAL HIGH (ref 70–99)

## 2019-02-15 LAB — SARS CORONAVIRUS 2 (TAT 6-24 HRS): SARS Coronavirus 2: NEGATIVE

## 2019-02-15 NOTE — Progress Notes (Addendum)
PROGRESS NOTE    Hayden [name]  CMK:[id number] DOB: 1941/02/12 DOA: 02/10/2019 PCP: Patient, No Pcp Per    Brief Narrative:  78 y.o. male with medical history significant of dementia, depression, T2DM, fibromyalgia, and multiple other medical problems presenting after being found down with R femur fracture.    History is limited due to patients mental status.  Obtained from chart, EDP, and Environmental education officer.  Pt unable to relay what happened.  He was found down after unwitnessed fall.  The staff is unable to report much more than this.  Pt complains of R hip pain.  At baseline, pt is able to ambulate.     ED Course: Labs, EKG, imaging.  Orthopedic c/s.  Admit to hospitalist due to R femur fracture.  Assessment & Plan:   Active Problems:   Femur fracture, right ([name])   Fall  Active Problems:   Femur fracture, right (HCC) Right Femur Fracture:  -Pt found down prior to admit -Imaging with mildly impacted and angulated intertochanteric fracture of the proximal R femur. -Orthopedic surgery consulted. Pt now s/p R hip surgery 8/13 -Cont analgesic as needed, seems comfortable at this time -PT recs for SNF. SW following.  Leukocytosis: likely reactive, improved  Dementia  Depression: continued on aricept, risperdal, carbamazepime, ativan, troazodone Delirium precautions -stable currently  HLD: continue on lovastatin as tolerated  T2DM: initially continued on levemir at 15 units nightly with SSI Continue asa/statin as tolerated -glucose improved. Will cont to titrate insulin as needed  BPH:  -continue with flomax as tolerated -Stable  Hypokalemia: -replaced  DVT prophylaxis: Lovenox subq Code Status: DNR Family Communication: Pt in room, family not at bedside Disposition Plan: snf, possible in next 24-48hrs  Consultants:   Orthopedic surgery  Procedures:   R hip surgery 8/13  Antimicrobials: Anti-infectives (From admission, onward)   Start      Dose/Rate Route Frequency Ordered Stop   02/10/19 2200  ceFAZolin (ANCEF) IVPB 2g/100 mL premix     2 g 200 mL/hr over 30 Minutes Intravenous Every 6 hours 02/10/19 1901 02/11/19 0725   02/10/19 1722  vancomycin (VANCOCIN) powder  Status:  Discontinued       As needed 02/10/19 1723 02/10/19 1743   02/10/19 1600  ceFAZolin (ANCEF) IVPB 2g/100 mL premix     2 g 200 mL/hr over 30 Minutes Intravenous On call to O.R. 02/10/19 1449 02/10/19 1628      Subjective: No complaints at this time  Objective: Vitals:   02/14/19 2208 02/15/19 0532 02/15/19 0533 02/15/19 1328  BP: (!) 155/67  (!) 144/73 (!) 127/57  Pulse: 89  80 85  Resp: 18  18 18   Temp: 98.5 F (36.9 C)  98 F (36.7 C) 98.4 F (36.9 C)  TempSrc: Oral   Oral  SpO2: 99%  99% 98%  Weight:  73.8 kg    Height:        Intake/Output Summary (Last 24 hours) at 02/15/2019 1526 Last data filed at 02/15/2019 1330 Gross per 24 hour  Intake 780 ml  Output 2250 ml  Net -1470 ml   Filed Weights   02/13/19 0500 02/14/19 0500 02/15/19 0532  Weight: 76 kg 76.4 kg 73.8 kg    Examination: General exam: Conversant, in no acute distress Respiratory system: normal chest rise, clear, no audible wheezing  Data Reviewed: I have personally reviewed following labs and imaging studies  CBC: Recent Labs  Lab 02/10/19 0812 02/10/19 1817 02/11/19 0304 02/12/19 0258  WBC 19.1*  19.5* 12.9* 10.6*  NEUTROABS 8.4*  --   --   --   HGB 14.0 12.6* 10.0* 8.6*  HCT 40.6 37.0* 29.2* 26.3*  MCV 94.4 95.1 95.7 97.4  PLT 200 206 162 102*   Basic Metabolic Panel: Recent Labs  Lab 02/10/19 0812 02/10/19 1817 02/11/19 0304 02/12/19 0258  NA 140  --  136 136  K 3.8  --  3.4* 3.9  CL 104  --  105 106  CO2 24  --  22 25  GLUCOSE 98  --  259* 207*  BUN 17  --  17 21  CREATININE 0.82 0.90 0.89 0.76  CALCIUM 9.5  --  8.1* 8.2*   GFR: Estimated Creatinine Clearance: 72.4 mL/min (by C-G formula based on SCr of 0.76 mg/dL). Liver Function  Tests: Recent Labs  Lab 02/10/19 0812 02/11/19 0304  AST 25 29  ALT 21 19  ALKPHOS 75 54  BILITOT 0.9 0.9  PROT 7.0 5.2*  ALBUMIN 4.5 3.2*   No results for input(s): LIPASE, AMYLASE in the last 168 hours. No results for input(s): AMMONIA in the last 168 hours. Coagulation Profile: Recent Labs  Lab 02/10/19 0812 02/11/19 0304  INR 1.0 1.2   Cardiac Enzymes: No results for input(s): CKTOTAL, CKMB, CKMBINDEX, TROPONINI in the last 168 hours. BNP (last 3 results) No results for input(s): PROBNP in the last 8760 hours. HbA1C: No results for input(s): HGBA1C in the last 72 hours. CBG: Recent Labs  Lab 02/14/19 1144 02/14/19 1606 02/14/19 2208 02/15/19 0732 02/15/19 1115  GLUCAP 215* 206* 141* 153* 150*   Lipid Profile: No results for input(s): CHOL, HDL, LDLCALC, TRIG, CHOLHDL, LDLDIRECT in the last 72 hours. Thyroid Function Tests: No results for input(s): TSH, T4TOTAL, FREET4, T3FREE, THYROIDAB in the last 72 hours. Anemia Panel: No results for input(s): VITAMINB12, FOLATE, FERRITIN, TIBC, IRON, RETICCTPCT in the last 72 hours. Sepsis Labs: No results for input(s): PROCALCITON, LATICACIDVEN in the last 168 hours.  Recent Results (from the past 240 hour(s))  SARS Coronavirus 2 [facility] order, Performed in [facility] hospital lab) Nasopharyngeal Nasopharyngeal Swab     Status: None   Collection Time: 02/10/19  8:12 AM   Specimen: Nasopharyngeal Swab  Result Value Ref Range Status   SARS Coronavirus 2 NEGATIVE NEGATIVE Final    Comment: (NOTE) If result is NEGATIVE SARS-CoV-2 target nucleic acids are NOT DETECTED. The SARS-CoV-2 RNA is generally detectable in upper and lower  respiratory specimens during the acute phase of infection. The lowest  concentration of SARS-CoV-2 viral copies this assay can detect is 250  copies / mL. A negative result does not preclude SARS-CoV-2 infection  and should not be used as the sole basis for treatment or other  patient  management decisions.  A negative result may occur with  improper specimen collection / handling, submission of specimen other  than nasopharyngeal swab, presence of viral mutation(s) within the  areas targeted by this assay, and inadequate number of viral copies  (<250 copies / mL). A negative result must be combined with clinical  observations, patient history, and epidemiological information. If result is POSITIVE SARS-CoV-2 target nucleic acids are DETECTED. The SARS-CoV-2 RNA is generally detectable in upper and lower  respiratory specimens dur ing the acute phase of infection.  Positive  results are indicative of active infection with SARS-CoV-2.  Clinical  correlation with patient history and other diagnostic information is  necessary to determine patient infection status.  Positive results do  not rule  out bacterial infection or co-infection with other viruses. If result is PRESUMPTIVE POSTIVE SARS-CoV-2 nucleic acids MAY BE PRESENT.   A presumptive positive result was obtained on the submitted specimen  and confirmed on repeat testing.  While 2019 novel coronavirus  (SARS-CoV-2) nucleic acids may be present in the submitted sample  additional confirmatory testing may be necessary for epidemiological  and / or clinical management purposes  to differentiate between  SARS-CoV-2 and other Sarbecovirus currently known to infect humans.  If clinically indicated additional testing with an alternate test  methodology [phone]) is advised. The SARS-CoV-2 RNA is generally  detectable in upper and lower respiratory sp ecimens during the acute  phase of infection. The expected result is Negative. Fact Sheet for Patients:  StrictlyIdeas.no Fact Sheet for Healthcare Providers: BankingDealers.co.za This test is not yet approved or cleared by the [place] FDA and has been authorized for detection and/or diagnosis of SARS-CoV-2 by FDA under  an Emergency Use Authorization (EUA).  This EUA will remain in effect (meaning this test can be used) for the duration of the COVID-19 declaration under Section 564(b)(1) of the Act, 21 U.S.C. section 360bbb-3(b)(1), unless the authorization is terminated or revoked sooner. Performed at [facility], [place] [address], [place], [place] [address]      Radiology Studies: No results found.  Scheduled Meds: . aspirin  81 mg Oral Daily  . carbamazepine  200 mg Oral Daily   And  . carbamazepine  300 mg Oral QHS  . celecoxib  200 mg Oral BID  . docusate sodium  100 mg Oral BID  . donepezil  10 mg Oral QHS  . enoxaparin (LOVENOX) injection  40 mg Subcutaneous Q24H  . insulin aspart  0-15 Units Subcutaneous TID WC  . insulin aspart  0-5 Units Subcutaneous QHS  . insulin detemir  24 Units Subcutaneous QHS  . pravastatin  20 mg Oral q1800  . risperiDONE  1 mg Oral BID  . tamsulosin  0.4 mg Oral QPM  . traZODone  200 mg Oral QHS   Continuous Infusions: . lactated ringers 20 mL/hr at 02/12/19 1610     LOS: 5 days   Marylu Lund, MD [facility] Pager On Amion  If 7PM-7AM, please contact night-coverage 02/15/2019, 3:26 PM

## 2019-02-15 NOTE — TOC Progression Note (Addendum)
Transition of Care [facility]) - Progression Note    Patient Details  Name: Hayden [name] MRN: [id number] Date of Birth: 1940-12-12  Transition [facility] Care [facility]) CM/SW Contact  Leeroy Cha, RN Phone Number: 02/15/2019, 10:10 AM  Clinical Narrative:    1010 tct-Kathy with [facility]- will call with bed availability and message to md for updated covid test. 1017/tcf-Kathy at [facility]- patient can come as soon as covid test is back. Dr. Wyline Copas made aware. 116-tcf-Kathy-unable to take patient until [id number] but can come in the am.      Expected Discharge Plan and Services                                                 Social Determinants of Health (SDOH) Interventions    Readmission Risk Interventions No flowsheet data found.

## 2019-02-15 NOTE — Progress Notes (Signed)
Physical Therapy Treatment Patient Details Name: Hayden [name] MRN: [id number] DOB: 1940/10/16 Today's Date: 02/15/2019    History of Present Illness 78 yo male admitted to ED from [facility] for fall, with resultant LBP and R hip intertrochanteric fracture. Pt s/p R hip IM nailing on 02/10/19. PMH includes dementia, DM, fibromyalgia, leukemia, cellulitis.    PT Comments    Pt reports being "interested" in physical therapy however declines to mobilize.  Pt also states he understands benefits of mobilizing however continued to decline to perform OOB.  Pt was agreeable to perform exercises so assisted with exercises in supine.  Pt to d/c to SNF when bed available.   Follow Up Recommendations  SNF;Supervision/Assistance - 24 hour     Equipment Recommendations  None recommended by PT    Recommendations for Other Services       Precautions / Restrictions Precautions Precautions: Fall Restrictions Weight Bearing Restrictions: No Other Position/Activity Restrictions: WBAT    Mobility  Bed Mobility               General bed mobility comments: pt refused  Transfers                    Ambulation/Gait                 Stairs             Wheelchair Mobility    Modified Rankin (Stroke Patients Only)       Balance                                            Cognition Arousal/Alertness: Awake/alert Behavior During Therapy: WFL for tasks assessed/performed Overall Cognitive Status: History of cognitive impairments - at baseline Area of Impairment: Orientation;Memory;Following commands;Safety/judgement;Problem solving                 Orientation Level: Situation   Memory: Decreased short-term memory Following Commands: Follows one step commands with increased time Safety/Judgement: Decreased awareness of safety   Problem Solving: Slow processing;Decreased initiation;Difficulty sequencing;Requires  tactile cues;Requires verbal cues General Comments: pt not willing to mobilize and unable to explain/communicate reasons/needs      Exercises General Exercises - Lower Extremity Ankle Circles/Pumps: 10 reps;AROM;Both Quad Sets: 10 reps;AROM;Right Short Arc Quad: AAROM;Right;10 reps Heel Slides: AAROM;Right;10 reps Hip ABduction/ADduction: AAROM;Right;10 reps    General Comments        Pertinent Vitals/Pain Pain Assessment: Faces Faces Pain Scale: Hurts a little bit Pain Location: R hip Pain Descriptors / Indicators: Grimacing;Sore Pain Intervention(s): Repositioned;Monitored during session    Home Living                      Prior Function            PT Goals (current goals can now be found in the care plan section) Progress towards PT goals: Progressing toward goals    Frequency    Min 2X/week      PT Plan Discharge plan needs to be updated    Co-evaluation              AM-PAC PT "6 Clicks" Mobility   Outcome Measure  Help needed turning from your back to your side while in a flat bed without using bedrails?: A Lot Help needed moving from lying on your back to sitting  on the side of a flat bed without using bedrails?: A Lot Help needed moving to and from a bed to a chair (including a wheelchair)?: A Lot Help needed standing up from a chair using your arms (e.g., wheelchair or bedside chair)?: A Lot Help needed to walk in hospital room?: A Lot Help needed climbing 3-5 steps with a railing? : Total 6 Click Score: 11    End of Session Equipment Utilized During Treatment: Gait belt Activity Tolerance: Patient limited by pain;Patient limited by fatigue Patient left: with call bell/phone within reach;in bed Nurse Communication: Mobility status PT Visit Diagnosis: Muscle weakness (generalized) (M62.81);History of falling (Z91.81)     Time: 1038-1050 PT Time Calculation (min) (ACUTE ONLY): 12 min  Charges:  $Therapeutic Exercise: 8-22  mins                     Carmelia Bake, PT, DPT Acute Rehabilitation Services Office: [phone] Pager: [phone]  Trena Platt 02/15/2019, 12:27 PM

## 2019-02-16 DIAGNOSIS — E119 Type 2 diabetes mellitus without complications: Secondary | ICD-10-CM

## 2019-02-16 DIAGNOSIS — Z794 Long term (current) use of insulin: Secondary | ICD-10-CM

## 2019-02-16 DIAGNOSIS — S72001A Fracture of unspecified part of neck of right femur, initial encounter for closed fracture: Secondary | ICD-10-CM

## 2019-02-16 DIAGNOSIS — F329 Major depressive disorder, single episode, unspecified: Secondary | ICD-10-CM

## 2019-02-16 DIAGNOSIS — N400 Enlarged prostate without lower urinary tract symptoms: Secondary | ICD-10-CM

## 2019-02-16 DIAGNOSIS — D72829 Elevated white blood cell count, unspecified: Secondary | ICD-10-CM

## 2019-02-16 DIAGNOSIS — F0390 Unspecified dementia without behavioral disturbance: Secondary | ICD-10-CM

## 2019-02-16 LAB — GLUCOSE, CAPILLARY
Glucose-Capillary: 143 mg/dL — ABNORMAL HIGH (ref 70–99)
Glucose-Capillary: 88 mg/dL (ref 70–99)
Glucose-Capillary: 228 mg/dL — ABNORMAL HIGH (ref 70–99)
Glucose-Capillary: 219 mg/dL — ABNORMAL HIGH (ref 70–99)

## 2019-02-16 MED ORDER — LORAZEPAM 1 MG PO TABS: 1.0000 mg | ORAL_TABLET | Freq: Three times a day (TID) | ORAL | 0 refills | Status: DC | PRN

## 2019-02-16 MED ORDER — LEVEMIR FLEXTOUCH 100 UNIT/ML ~~LOC~~ SOPN: 24.0000 [IU] | PEN_INJECTOR | Freq: Every day | SUBCUTANEOUS | 0 refills | Status: DC

## 2019-02-16 MED ORDER — CELECOXIB 200 MG PO CAPS: 200.0000 mg | ORAL_CAPSULE | Freq: Two times a day (BID) | ORAL | 0 refills | Status: DC

## 2019-02-16 NOTE — TOC Transition Note (Addendum)
Transition of Care [facility]) - CM/SW Discharge Note   Patient Details  Name: Hayden [name] MRN: [id number] Date of Birth: 10-11-1940  Transition of Care [facility]) CM/SW Contact:  Leeroy Cha, RN Phone Number: 02/16/2019, 2:48 PM   Clinical Narrative:    notificatrion of dc and summary to Harriet Pho via phone and hub.room 116p can send at 1730 call reprt to [phone] tct-ptar for pick up at 1730pm.  Final next level of care: Skilled Nursing Facility Barriers to Discharge: Barriers Resolved   Patient Goals and CMS Choice        Discharge Placement                       Discharge Plan and Services                                     Social Determinants of Health (SDOH) Interventions     Readmission Risk Interventions No flowsheet data found.

## 2019-02-16 NOTE — Discharge Summary (Signed)
Physician Discharge Summary  Hayden [name] KTG:[id number] DOB: [birth year] DOA: 02/10/2019  PCP: Patient, No Pcp Per  Admit date: 02/10/2019 Discharge date: 02/16/2019  Time spent: 55 minutes  Recommendations for Outpatient Follow-up:  1. Patient will discharge to skilled nursing facility.  Follow-up with MD at skilled nursing facility.  Patient will need a CBC and a basic metabolic profile done in 1 week. 2. Follow-up with Dr. Griffin Basil, orthopedics in 2 weeks.   Discharge Diagnoses:  Principal Problem:   Closed fracture of right hip ([place]) Active Problems:   Type 2 diabetes mellitus without complication, with long-term current use of insulin ([facility])   Depression   BPH (benign prostatic hyperplasia)   Dementia without behavioral disturbance ([place])   Femur fracture, right ([place])   Fall   Discharge Condition: Stable and improved  Diet recommendation: Carb modified diet  Filed Weights   02/14/19 0500 02/15/19 0532 02/16/19 0748  Weight: 76.4 kg 73.8 kg 70.7 kg    History of present illness:  HPI per Dr Maryla Morrow is a 78 y.o. male with medical history significant of dementia, depression, T2DM, fibromyalgia, and multiple other medical problems presenting after being found down with R femur fracture.    History is limited due to patients mental status.  Obtained from chart, EDP, and Environmental education officer.  Pt unable to relay what happened.  He was found down after unwitnessed fall.  The staff is unable to report much more than this.  Pt complains of R hip pain.  At baseline, pt is able to ambulate.     ED Course: Labs, EKG, imaging.  Orthopedic c/s.  Admit to hospitalist due to R femur fracture.  Hospital Course:  Right Femur Fracture:  -Pt found down prior to admit -Imaging with mildly impacted and angulated intertochanteric fracture of the proximal R femur. -Orthopedic surgery consulted. Pt subsequently underwent right hip cephalo-medullary nail per Dr. Griffin Basil on  02/10/2019 without any complications.  Patient was maintained on pain regimen per orthopedics.  Patient was seen by PT.  Pain was managed during the hospitalization.  Patient will be discharged to skilled nursing facility.  Leukocytosis: likely reactive, secondary to acute fracture.  Leukocytosis improved during the hospitalization.  Outpatient follow-up.  Dementia  Depression: Remained stable.  Patient was maintained on home regimen of  aricept, risperdal, carbamazepime, ativan, troazodone.  HLD: Maintained on a statin throughout the hospitalization.    T2DM:  Started on Levemir 15 units daily as well as sliding scale insulin.  Levemir dose was adjusted to 24 units daily.  Patient was discharged to skilled nursing facility.  Outpatient follow-up.   BPH:  -Patient maintained on flomax as tolerated.   Hypokalemia: -Repleted.  Procedures:  R hip surgery 8/13  Consultations:  Orthopedic surgery  Discharge Exam: Vitals:   02/16/19 1337 02/16/19 1340  BP:  (!) 110/48  Pulse:  79  Resp: 15   Temp: 98.2 F (36.8 C)   SpO2:  99%    General: NAD Cardiovascular: RRR Respiratory: CTAB  Discharge Instructions   Discharge Instructions    Diet Carb Modified   Complete by: As directed    Increase activity slowly   Complete by: As directed      Allergies as of 02/16/2019      Reactions   Bee Venom Other (See Comments)   Bump where stung and took days to go away   Glucophage [metformin] Diarrhea   "I will never take it again - stool incontinent"  Shellfish Allergy Swelling   Grass Extracts [gramineae Pollens] Other (See Comments)   Unknown rxn   Tobacco [nicotiana Tabacum] Other (See Comments)   Unknown rxn      Medication List    STOP taking these medications   HYDROcodone-acetaminophen 5-325 MG tablet Commonly known as: NORCO/VICODIN   sulfamethoxazole-trimethoprim 800-160 MG tablet Commonly known as: BACTRIM DS     TAKE these medications    acetaminophen 500 MG tablet Commonly known as: TYLENOL Take 2 tablets (1,000 mg total) by mouth every 8 (eight) hours for 14 days. What changed:   how much to take  when to take this  reasons to take this   alum & mag hydroxide-simeth 200-200-20 MG/5ML suspension Commonly known as: MAALOX/MYLANTA Take 30 mLs by mouth every 6 (six) hours as needed for indigestion or heartburn. Do not exceed 4 doses in 24 hours   Aspercreme w/Lidocaine 4 % Crea Generic drug: Lidocaine HCl Apply 1 application topically 2 (two) times daily.   aspirin 81 MG chewable tablet Chew 81 mg by mouth daily.   carbamazepine 200 MG tablet Commonly known as: TEGRETOL Take 200-300 mg by mouth See admin instructions. Take 300 mg by mouth in the morning and take 200 mg by mouth at bedtime   celecoxib 200 MG capsule Commonly known as: CELEBREX Take 1 capsule (200 mg total) by mouth 2 (two) times daily.   dimethicone-zinc oxide cream Apply 1 application topically See admin instructions. Apply to buttocks and scrotum with each incontinence episode as needed   docusate sodium 100 MG capsule Commonly known as: COLACE Take 100 mg by mouth daily.   donepezil 10 MG tablet Commonly known as: ARICEPT Take 1 tablet (10 mg total) by mouth at bedtime.   enoxaparin 40 MG/0.4ML injection Commonly known as: LOVENOX Inject 0.4 mLs (40 mg total) into the skin daily.   EPINEPHrine 0.15 MG/0.3ML injection Commonly known as: EPIPEN JR Inject 0.15 mg into the muscle as needed for anaphylaxis.   guaiFENesin 100 MG/5ML liquid Commonly known as: ROBITUSSIN Take 200 mg by mouth every 6 (six) hours as needed for cough.   insulin lispro 100 UNIT/ML KwikPen Commonly known as: HUMALOG Inject 6 Units into the skin 3 (three) times daily with meals.   Levemir FlexTouch 100 UNIT/ML Pen Generic drug: Insulin Detemir Inject 24 Units into the skin at bedtime. What changed: how much to take   loperamide 2 MG capsule Commonly  known as: IMODIUM Take 2 mg by mouth See admin instructions. Take 2 mg by mouth with each loose stool as needed for diarrhea (do not exceed 8 doses in 24 hours)   LORazepam 1 MG tablet Commonly known as: ATIVAN Take 1 tablet (1 mg total) by mouth every 8 (eight) hours as needed for anxiety (agitation).   lovastatin 20 MG tablet Commonly known as: MEVACOR Take 20 mg by mouth daily.   magnesium hydroxide 400 MG/5ML suspension Commonly known as: MILK OF MAGNESIA Take 30 mLs by mouth at bedtime as needed for mild constipation.   neomycin-bacitracin-polymyxin [id number] ointment Apply 1 application topically See admin instructions. *Standing order for skin tear, abrasions* Clean area with normal saline. Apply tao, cover with bandaid or gauze and tape. Change as needed until healed.   nystatin cream Commonly known as: MYCOSTATIN Apply to affected area 2 times daily   oxyCODONE 5 MG immediate release tablet Commonly known as: Oxy IR/ROXICODONE Take 0.5-1 pills every 4 hrs as needed for pain   risperiDONE 1 MG tablet Commonly  known as: RISPERDAL Take 1 mg by mouth 2 (two) times a day.   tamsulosin 0.4 MG Caps capsule Commonly known as: FLOMAX Take 0.4 mg by mouth every evening.   traZODone 100 MG tablet Commonly known as: DESYREL Take 200 mg by mouth at bedtime.      Allergies  Allergen Reactions  . Bee Venom Other (See Comments)    Bump where stung and took days to go away  . Glucophage [Metformin] Diarrhea    "I will never take it again - stool incontinent"  . Shellfish Allergy Swelling  . Grass Extracts [Gramineae Pollens] Other (See Comments)    Unknown rxn  . Tobacco [Nicotiana Tabacum] Other (See Comments)    Unknown rxn   Follow-up Information    Hiram Gash, MD. Schedule an appointment as soon as possible for a visit in 2 weeks.   Specialty: Orthopedic Surgery Why: For wound re-check Contact information: [address] [place]  [address] [phone]        MD AT SNF Follow up.            The results of significant diagnostics from this hospitalization (including imaging, microbiology, ancillary and laboratory) are listed below for reference.    Significant Diagnostic Studies: Ct Head Wo Contrast  Result Date: 02/10/2019 CLINICAL DATA:  Unwitnessed fall. EXAM: CT CERVICAL SPINE WITHOUT CONTRAST TECHNIQUE: Multidetector CT imaging of the cervical spine was performed without intravenous contrast. Multiplanar CT image reconstructions were also generated. COMPARISON:  None. FINDINGS: Alignment: Normal Skull base and vertebrae: No evidence of fracture. Chronic fusion at C5-6. 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 Upper chest: Negative Other: None IMPRESSION: No acute or traumatic finding. 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 Electronically Signed   By: [name] Chimes M.D.   On: 02/10/2019 14:46   Ct Cervical Spine Wo Contrast  Result Date: 02/10/2019 CLINICAL DATA:  Unwitnessed fall. EXAM: CT CERVICAL SPINE WITHOUT CONTRAST TECHNIQUE: Multidetector CT imaging of the cervical spine was performed without intravenous contrast. Multiplanar CT image reconstructions were also generated. COMPARISON:  None. FINDINGS: Alignment: Normal Skull base and vertebrae: No evidence of fracture. Chronic fusion at C5-6. 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 Upper chest: Negative Other: None IMPRESSION: No acute or traumatic finding. 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 Electronically Signed   By: [name] Chimes M.D.   On: 02/10/2019 14:46   Dg Chest Port 1 View  Result Date: 02/10/2019 CLINICAL DATA:  Pain after fall. EXAM: PORTABLE CHEST 1 VIEW COMPARISON:  None. FINDINGS: The heart size and mediastinal contours are within normal  limits. Both lungs are clear. No pneumothorax or pleural effusion is noted. The visualized skeletal structures are unremarkable. IMPRESSION: No active disease. Aortic Atherosclerosis (ICD10-I70.0). Electronically Signed   By: Marijo Conception M.D.   On: 02/10/2019 10:48   Dg Knee Right Port  Result Date: 02/10/2019 CLINICAL DATA:  Right knee pain EXAM: PORTABLE RIGHT KNEE - 1-2 VIEW COMPARISON:  None. FINDINGS: No acute fracture. No malalignment. Mild medial compartment joint space narrowing. Chondrocalcinosis of the menisci. No knee joint effusion. Small quadriceps tendon enthesophyte. Extensive vascular calcifications. IMPRESSION: No acute osseous abnormality, right knee. Electronically Signed   By: Davina Poke M.D.   On: 02/10/2019 10:49   Dg C-arm 1-60 Min-no Report  Result Date: 02/10/2019 Fluoroscopy was utilized  by the requesting physician.  No radiographic interpretation.   Dg Hip Port Unilat With Pelvis 1v Right  Result Date: 02/10/2019 CLINICAL DATA:  Post right hip ORIF. EXAM: DG HIP (WITH OR WITHOUT PELVIS) 1V PORT RIGHT COMPARISON:  Preoperative radiographs earlier this day. FINDINGS: Intramedullary nail with distal locking and trans trochanteric screw fixation fixating intertrochanteric right femur fracture. Fracture is in improved alignment compared to preoperative imaging. Recent postsurgical change includes air and edema in the soft tissues. There are dense vascular calcifications. IMPRESSION: Post ORIF intertrochanteric right femur fracture without immediate postoperative complication. Electronically Signed   By: Keith Rake M.D.   On: 02/10/2019 18:15   Dg Hip Operative Unilat W Or W/o Pelvis Right  Result Date: 02/10/2019 CLINICAL DATA:  Right IM nail EXAM: OPERATIVE RIGHT HIP (WITH PELVIS IF PERFORMED) 4 VIEWS TECHNIQUE: Fluoroscopic spot image(s) were submitted for interpretation post-operatively. COMPARISON:  Right hip radiographs dated 02/10/2019 FLUOROSCOPY TIME:  51  seconds 8.1 mGy FINDINGS: Intraoperative fluoroscopic images demonstrating in IM nail with dynamic hip screw fixation of an intertrochanteric right hip fracture. Fracture fragments are in near anatomic alignment and position. IMPRESSION: Intraoperative fluoroscopic radiographs during ORIF of the right hip, as above. Electronically Signed   By: Julian Hy M.D.   On: 02/10/2019 19:26   Dg Hip Unilat With Pelvis 2-3 Views Right  Result Date: 02/10/2019 CLINICAL DATA:  Right hip pain EXAM: DG HIP (WITH OR WITHOUT PELVIS) 2-3V RIGHT COMPARISON:  09/09/2013 FINDINGS: Acute intertrochanteric fracture of the proximal right femur with slight impaction and coxa vara alignment. Right hip joint remains aligned without dislocation. Bones are demineralized. No additional fractures are identified. Advanced vascular calcifications. IMPRESSION: Acute mildly impacted and angulated intertrochanteric fracture of the proximal right femur. Electronically Signed   By: Davina Poke M.D.   On: 02/10/2019 10:48    Microbiology: Recent Results (from the past 240 hour(s))  SARS Coronavirus 2 [facility] order, Performed in [facility] hospital lab) Nasopharyngeal Nasopharyngeal Swab     Status: None   Collection Time: 02/10/19  8:12 AM   Specimen: Nasopharyngeal Swab  Result Value Ref Range Status   SARS Coronavirus 2 NEGATIVE NEGATIVE Final    Comment: (NOTE) If result is NEGATIVE SARS-CoV-2 target nucleic acids are NOT DETECTED. The SARS-CoV-2 RNA is generally detectable in upper and lower  respiratory specimens during the acute phase of infection. The lowest  concentration of SARS-CoV-2 viral copies this assay can detect is 250  copies / mL. A negative result does not preclude SARS-CoV-2 infection  and should not be used as the sole basis for treatment or other  patient management decisions.  A negative result may occur with  improper specimen collection / handling, submission of specimen other  than  nasopharyngeal swab, presence of viral mutation(s) within the  areas targeted by this assay, and inadequate number of viral copies  (<250 copies / mL). A negative result must be combined with clinical  observations, patient history, and epidemiological information. If result is POSITIVE SARS-CoV-2 target nucleic acids are DETECTED. The SARS-CoV-2 RNA is generally detectable in upper and lower  respiratory specimens dur ing the acute phase of infection.  Positive  results are indicative of active infection with SARS-CoV-2.  Clinical  correlation with patient history and other diagnostic information is  necessary to determine patient infection status.  Positive results do  not rule out bacterial infection or co-infection with other viruses. If result is PRESUMPTIVE POSTIVE SARS-CoV-2 nucleic acids MAY BE PRESENT.  A presumptive positive result was obtained on the submitted specimen  and confirmed on repeat testing.  While 2019 novel coronavirus  (SARS-CoV-2) nucleic acids may be present in the submitted sample  additional confirmatory testing may be necessary for epidemiological  and / or clinical management purposes  to differentiate between  SARS-CoV-2 and other Sarbecovirus currently known to infect humans.  If clinically indicated additional testing with an alternate test  methodology [phone]) is advised. The SARS-CoV-2 RNA is generally  detectable in upper and lower respiratory sp ecimens during the acute  phase of infection. The expected result is Negative. Fact Sheet for Patients:  StrictlyIdeas.no Fact Sheet for Healthcare Providers: BankingDealers.co.za This test is not yet approved or cleared by the [place] FDA and has been authorized for detection and/or diagnosis of SARS-CoV-2 by FDA under an Emergency Use Authorization (EUA).  This EUA will remain in effect (meaning this test can be used) for the duration of  the COVID-19 declaration under Section 564(b)(1) of the Act, 21 U.S.C. section 360bbb-3(b)(1), unless the authorization is terminated or revoked sooner. Performed at [facility], [place] [address] [place], [place] [address]   SARS CORONAVIRUS 2 Nasal Swab Aptima Multi Swab     Status: None   Collection Time: 02/15/19 10:55 AM   Specimen: Aptima Multi Swab; Nasal Swab  Result Value Ref Range Status   SARS Coronavirus 2 NEGATIVE NEGATIVE Final    Comment: (NOTE) SARS-CoV-2 target nucleic acids are NOT DETECTED. The SARS-CoV-2 RNA is generally detectable in upper and lower respiratory specimens during the acute phase of infection. Negative results do not preclude SARS-CoV-2 infection, do not rule out co-infections with other pathogens, and should not be used as the sole basis for treatment or other patient management decisions. Negative results must be combined with clinical observations, patient history, and epidemiological information. The expected result is Negative. Fact Sheet for Patients: SugarRoll.be Fact Sheet for Healthcare Providers: https://www.woods-mathews.com/ This test is not yet approved or cleared by the [place] FDA and  has been authorized for detection and/or diagnosis of SARS-CoV-2 by FDA under an Emergency Use Authorization (EUA). This EUA will remain  in effect (meaning this test can be used) for the duration of the COVID-19 declaration under Section 56 4(b)(1) of the Act, 21 U.S.C. section 360bbb-3(b)(1), unless the authorization is terminated or revoked sooner. Performed at [facility], [place] [address], [place], [place] [address]      Labs: Basic Metabolic Panel: Recent Labs  Lab 02/10/19 0812 02/10/19 1817 02/11/19 0304 02/12/19 0258  NA 140  --  136 136  K 3.8  --  3.4* 3.9  CL 104  --  105 106  CO2 24  --  22 25  GLUCOSE 98  --  259* 207*  BUN 17  --  17 21  CREATININE  0.82 0.90 0.89 0.76  CALCIUM 9.5  --  8.1* 8.2*   Liver Function Tests: Recent Labs  Lab 02/10/19 0812 02/11/19 0304  AST 25 29  ALT 21 19  ALKPHOS 75 54  BILITOT 0.9 0.9  PROT 7.0 5.2*  ALBUMIN 4.5 3.2*   No results for input(s): LIPASE, AMYLASE in the last 168 hours. No results for input(s): AMMONIA in the last 168 hours. CBC: Recent Labs  Lab 02/10/19 0812 02/10/19 1817 02/11/19 0304 02/12/19 0258  WBC 19.1* 19.5* 12.9* 10.6*  NEUTROABS 8.4*  --   --   --   HGB 14.0 12.6* 10.0* 8.6*  HCT  40.6 37.0* 29.2* 26.3*  MCV 94.4 95.1 95.7 97.4  PLT 200 206 162 140*   Cardiac Enzymes: No results for input(s): CKTOTAL, CKMB, CKMBINDEX, TROPONINI in the last 168 hours. BNP: BNP (last 3 results) No results for input(s): BNP in the last 8760 hours.  ProBNP (last 3 results) No results for input(s): PROBNP in the last 8760 hours.  CBG: Recent Labs  Lab 02/15/19 1115 02/15/19 1606 02/15/19 2148 02/16/19 0747 02/16/19 1134  GLUCAP 150* 142* 264* 143* 88       Signed:  Irine Seal MD.  [facility] 02/16/2019, 2:39 PM

## 2019-02-16 NOTE — Progress Notes (Signed)
Report was provided to [employer] at 1515. [facility] scheduled to transport pt to facility at 1730.

## 2019-02-16 NOTE — Progress Notes (Signed)
Physical Therapy Treatment Patient Details Name: Hayden [name] MRN: [id number] DOB: 01/19/1941 Today's Date: 02/16/2019    History of Present Illness 78 yo male admitted to ED from [facility] for fall, with resultant LBP and R hip intertrochanteric fracture. Pt s/p R hip IM nailing on 02/10/19. PMH includes dementia, DM, fibromyalgia, leukemia, cellulitis.    PT Comments    Pt agreeable to OOB activity this session. Pt able to take small steps to get to recliner, limited by LE weakness and generalized fatigue. Pt required min-mod assist +2 for all mobility this session. Pt tolerated LE exercises well, but again is limited by fatigue. PT to continue to recommend SNF level of care, will continue to follow acutely.    Follow Up Recommendations  SNF;Supervision/Assistance - 24 hour     Equipment Recommendations  None recommended by PT    Recommendations for Other Services       Precautions / Restrictions Precautions Precautions: Fall Restrictions Other Position/Activity Restrictions: WBAT    Mobility  Bed Mobility Overal bed mobility: Needs Assistance Bed Mobility: Supine to Sit     Supine to sit: Mod assist;HOB elevated;+2 for safety/equipment     General bed mobility comments: Mod assist for LE and trunk management, scooting to EOB with use of bed pad, and pt with use of UEs on bedrail to assist. Increased time, verbal cuing for sequencing provided.  Transfers Overall transfer level: Needs assistance Equipment used: Rolling walker (2 wheeled) Transfers: Sit to/from [facility] Sit to Stand: +2 safety/equipment;From elevated surface;Mod assist;+2 physical assistance Stand pivot transfers: Min assist;+2 safety/equipment;From elevated surface       General transfer comment: Mod assist +2 for power up, steadying, trunk and hip extension to come to erect standing. Pt soiled with feces upon standing, pt sustained staic standing for pericare  for ~2 minutes. Pt required min assist for steadying, physically moving RW  Ambulation/Gait Ambulation/Gait assistance: (NT - pt fatigue)               Stairs             Wheelchair Mobility    Modified Rankin (Stroke Patients Only)       Balance Overall balance assessment: Needs assistance;History of Falls Sitting-balance support: No upper extremity supported;Feet supported Sitting balance-Leahy Scale: Fair     Standing balance support: Bilateral upper extremity supported Standing balance-Leahy Scale: Poor Standing balance comment: reliant on external support                            Cognition Arousal/Alertness: Awake/alert Behavior During Therapy: WFL for tasks assessed/performed Overall Cognitive Status: History of cognitive impairments - at baseline Area of Impairment: Memory;Following commands;Safety/judgement;Problem solving                     Memory: Decreased short-term memory Following Commands: Follows one step commands with increased time Safety/Judgement: Decreased awareness of safety;Decreased awareness of deficits   Problem Solving: Slow processing;Decreased initiation;Difficulty sequencing;Requires tactile cues;Requires verbal cues General Comments: Pt often repeats commands asked of him, requires increased time to perform.      Exercises General Exercises - Lower Extremity Quad Sets: 10 reps;AROM;Right;Supine Heel Slides: AAROM;Right;10 reps;Supine Hip ABduction/ADduction: AAROM;10 reps;Seated;Both    General Comments        Pertinent Vitals/Pain Pain Assessment: Faces Faces Pain Scale: Hurts little more Pain Location: R hip Pain Descriptors / Indicators: Grimacing;Sore Pain Intervention(s): Limited activity within  patient's tolerance;Monitored during session;Repositioned;Premedicated before session    Home Living                      Prior Function            PT Goals (current goals can now be  found in the care plan section) Acute Rehab PT Goals PT Goal Formulation: Patient unable to participate in goal setting Time For Goal Achievement: 02/25/19 Potential to Achieve Goals: Good Progress towards PT goals: Progressing toward goals    Frequency    Min 2X/week      PT Plan Current plan remains appropriate    Co-evaluation              AM-PAC PT "6 Clicks" Mobility   Outcome Measure  Help needed turning from your back to your side while in a flat bed without using bedrails?: A Lot Help needed moving from lying on your back to sitting on the side of a flat bed without using bedrails?: A Lot Help needed moving to and from a bed to a chair (including a wheelchair)?: A Lot Help needed standing up from a chair using your arms (e.g., wheelchair or bedside chair)?: A Lot Help needed to walk in hospital room?: A Lot Help needed climbing 3-5 steps with a railing? : Total 6 Click Score: 11    End of Session Equipment Utilized During Treatment: Gait belt Activity Tolerance: Patient tolerated treatment well;Patient limited by fatigue Patient left: with call bell/phone within reach;in chair;with chair alarm set Nurse Communication: Mobility status PT Visit Diagnosis: Muscle weakness (generalized) (M62.81);History of falling (Z91.81)     Time: 2536-6440 PT Time Calculation (min) (ACUTE ONLY): 20 min  Charges:  $Therapeutic Activity: 8-22 mins                     Kennett Symes Conception Chancy, PT Acute Rehabilitation Services Pager [phone]  Office [phone]    Doreen Garretson D Lavonn Maxcy 02/16/2019, 1:25 PM

## 2019-03-07 IMAGING — MR MR PELVIS WO/W CM
4 of 9 series · 19 of 48 positions shown · IV contrast (6)
Comparison: 08/02/2015 CT abdomen/pelvis.

CLINICAL DATA: Groin pain. Penile and scrotal excoriation. Acute
lymphangitis of the perineum.

EXAM:
MRI PELVIS WITHOUT AND WITH CONTRAST
TECHNIQUE: Multiplanar multisequence MR imaging of the pelvis was performed
both before and after administration of intravenous contrast.
CONTRAST:  6 cc Gadavist IV.

[Series 3: T2 · axial · 5.0mm · 0.47mm/px · z∈[-161,+151]mm · 6 of 53 slices shown (1 of 3)]
[im 1/53]
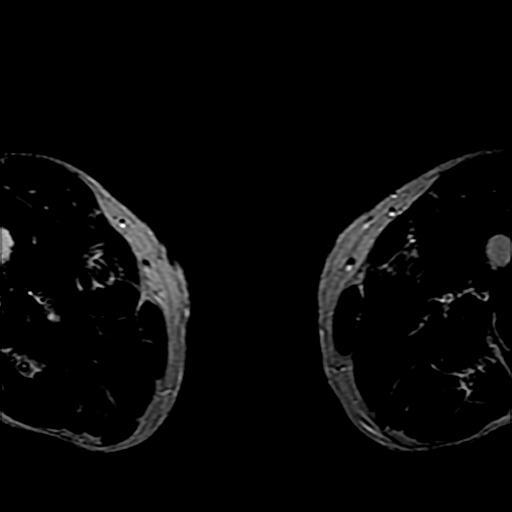
[im 11/53]
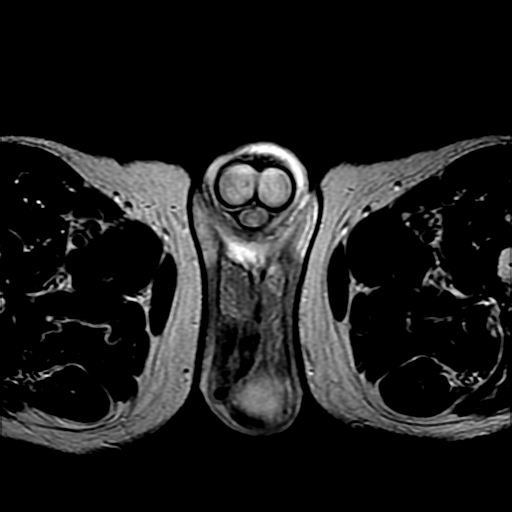
[im 21/53]
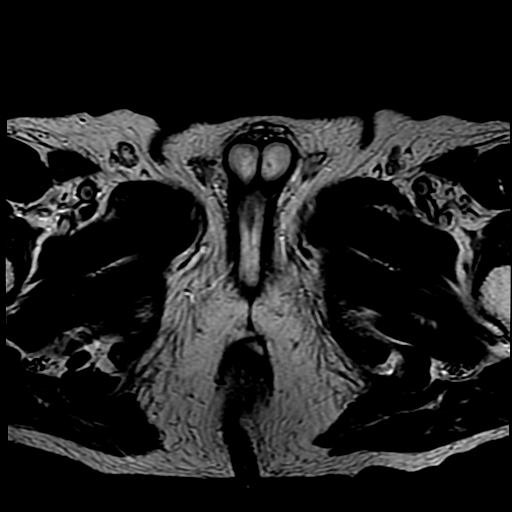
[im 32/53]
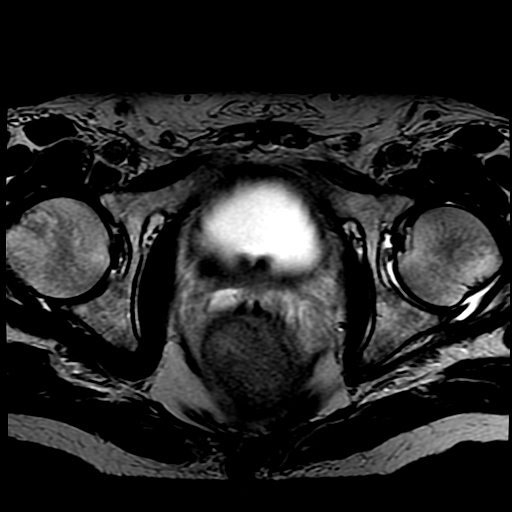
[im 42/53]
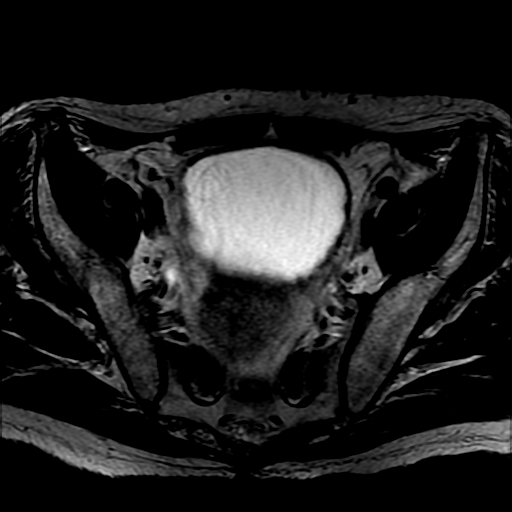
[im 53/53]
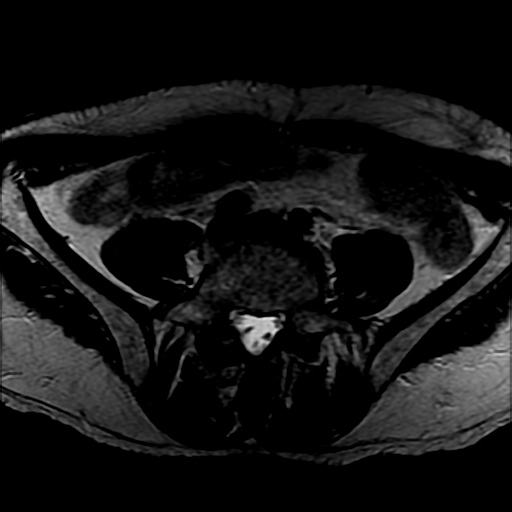

[Series 4: T2 fat-sat · axial · 5.0mm · 0.47mm/px · z∈[-161,+151]mm · 6 of 53 slices shown]
[im 1/53]
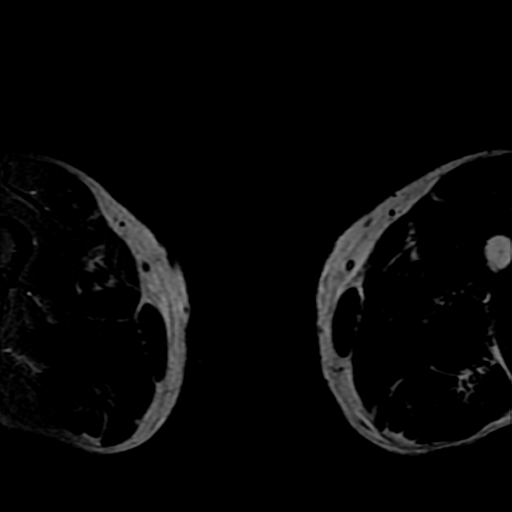
[im 11/53]
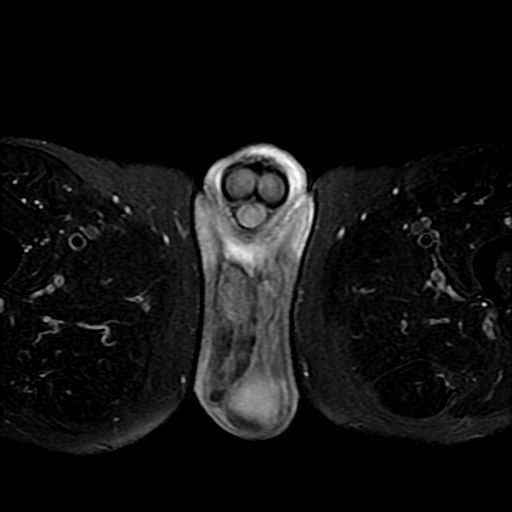
[im 21/53]
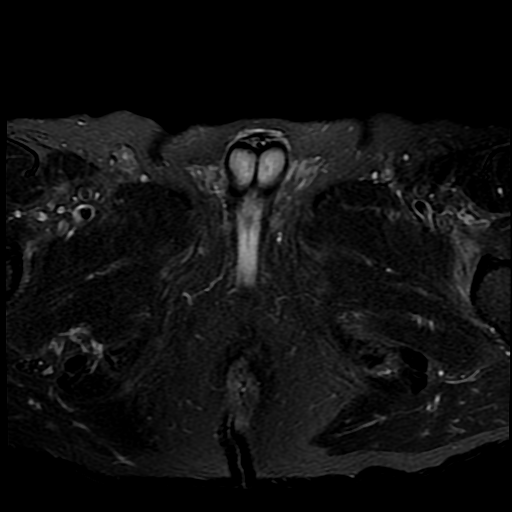
[im 32/53]
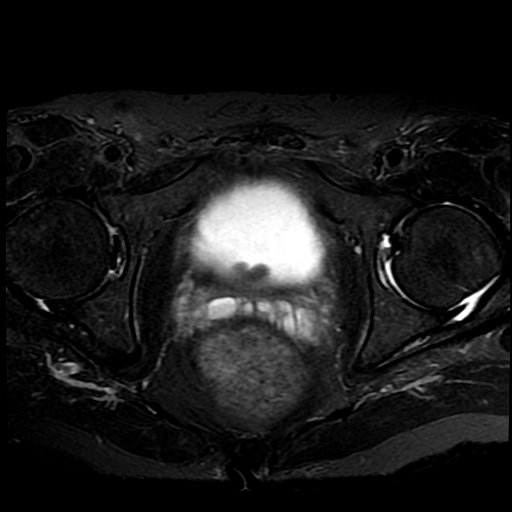
[im 42/53]
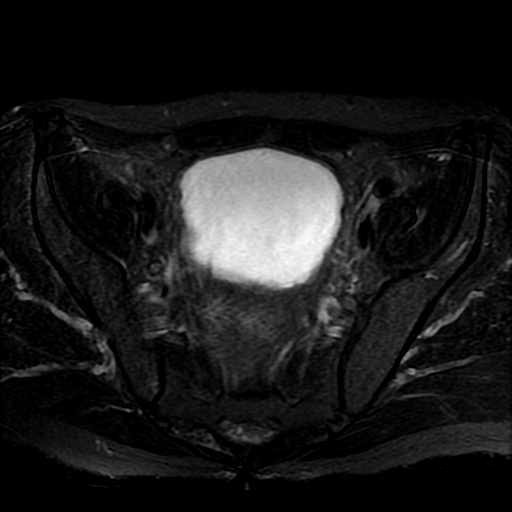
[im 53/53]
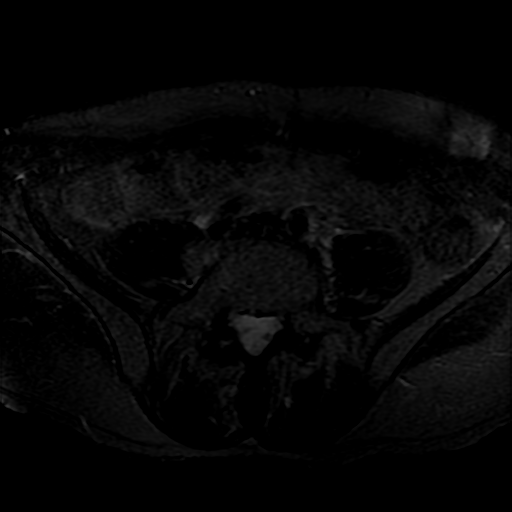

[Series 5: T2 · sagittal · 5.0mm · 0.59mm/px · 3 of 30 slices shown (2 of 3)]
[im 1/30]
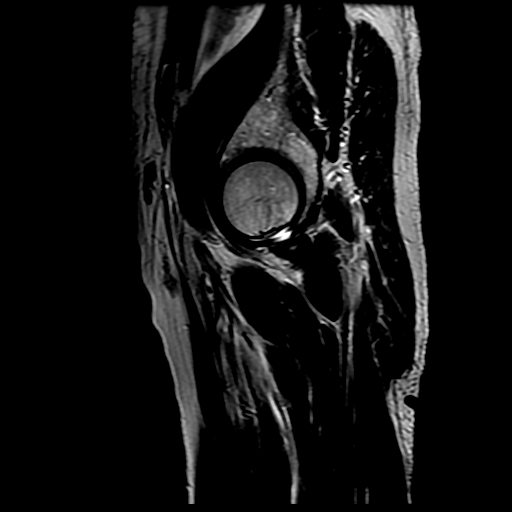
[im 15/30]
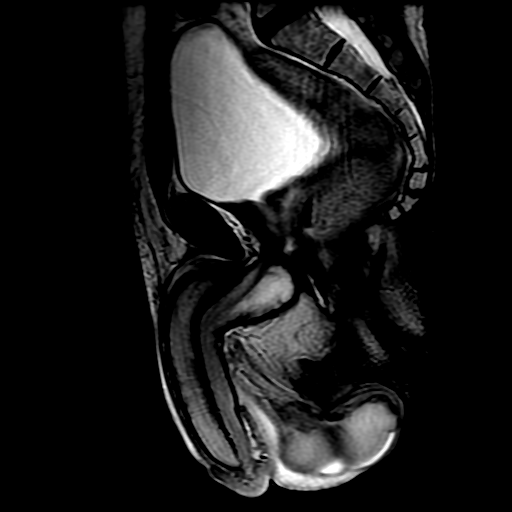
[im 30/30]
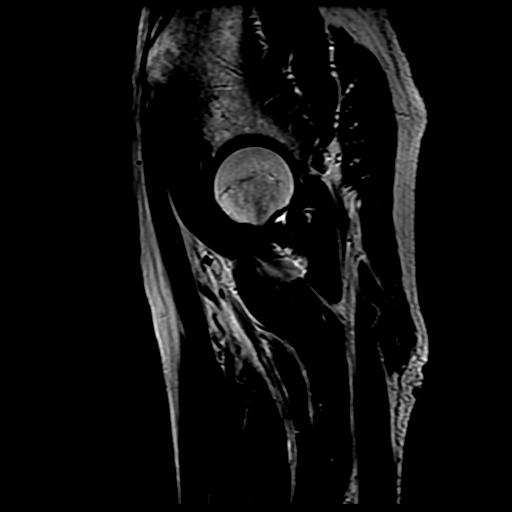

[Series 6: T2 · coronal · 5.0mm · 0.57mm/px · 4 of 31 slices shown (3 of 3)]
[im 1/31]
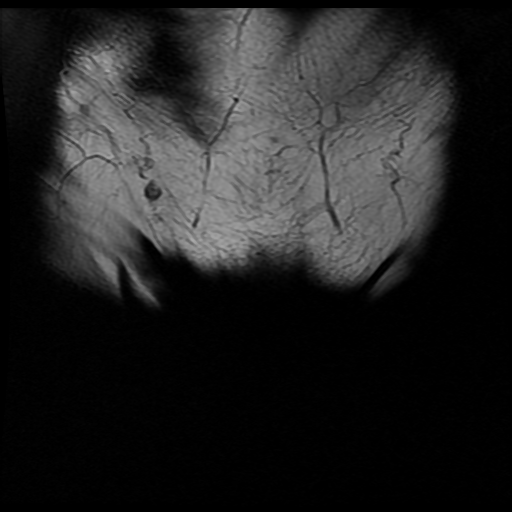
[im 11/31]
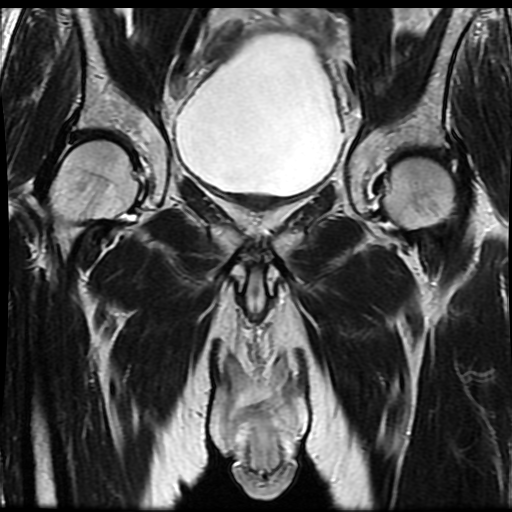
[im 21/31]
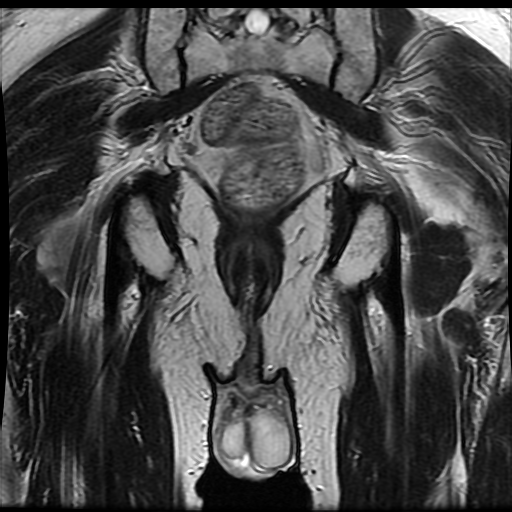
[im 31/31]
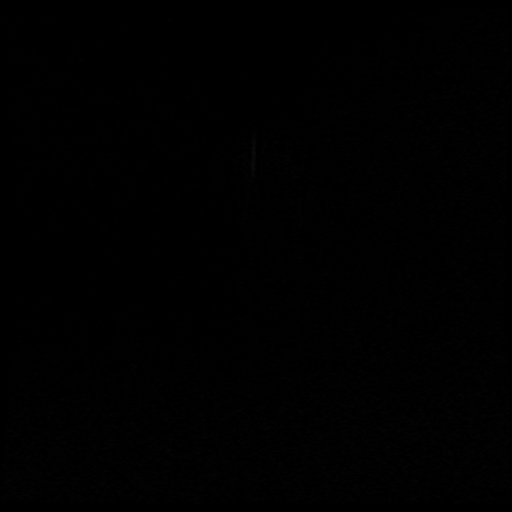

[19 of 48 positions shown; findings below may reference images not displayed]

FINDINGS: Urinary Tract:  Normal bladder.  Distal ureters are normal caliber.

Bowel: No bowel wall thickening or hyperenhancement. No dilatation
of pelvic bowel loops.

Vascular/Lymphatic: No pathologically enlarged pelvic lymph nodes.
No acute vascular abnormality.

Reproductive: Normal size prostate. Symmetric normal seminal
vesicles. Symmetric normal testes. No significant hydrocele. There
is mild circumferential superficial edema throughout the mid to
distal penile shaft, which is contiguous with mild-to-moderate
superficial edema in the anterior scrotum. Corpora cavernosa and
corpus spongiosum are normal.

Other:  No fluid collections in the penis, scrotum or pelvis.

Musculoskeletal: No aggressive appearing focal osseous lesions.
IMPRESSION: Nonspecific superficial edema throughout the mid to distal penile
shaft and anterior scrotum. No abscess.

## 2019-05-11 ENCOUNTER — Encounter (HOSPITAL_COMMUNITY): Payer: Self-pay | Admitting: *Deleted

## 2019-05-11 ENCOUNTER — Emergency Department (HOSPITAL_COMMUNITY): Payer: Medicare Other

## 2019-05-11 ENCOUNTER — Emergency Department (HOSPITAL_COMMUNITY)
Admission: EM | Admit: 2019-05-11 | Discharge: 2019-05-12 | Disposition: A | Discharge status: Skilled Nursing Facility | Payer: Medicare Other | Source: Home / Self Care | Attending: Emergency Medicine | Admitting: Emergency Medicine

## 2019-05-11 DIAGNOSIS — R296 Repeated falls: Secondary | ICD-10-CM

## 2019-05-11 DIAGNOSIS — K922 Gastrointestinal hemorrhage, unspecified: Secondary | ICD-10-CM

## 2019-05-11 DIAGNOSIS — U071 COVID-19: Secondary | ICD-10-CM | POA: Diagnosis not present

## 2019-05-11 LAB — COMPREHENSIVE METABOLIC PANEL
ALT: 26 U/L (ref 0–44)
AST: 41 U/L (ref 15–41)
Albumin: 3.5 g/dL (ref 3.5–5.0)
Alkaline Phosphatase: 85 U/L (ref 38–126)
Anion gap: 10 (ref 5–15)
BUN: 28 mg/dL — ABNORMAL HIGH (ref 8–23)
CO2: 21 mmol/L — ABNORMAL LOW (ref 22–32)
Calcium: 7.9 mg/dL — ABNORMAL LOW (ref 8.9–10.3)
Chloride: 105 mmol/L (ref 98–111)
Creatinine, Ser: 0.96 mg/dL (ref 0.61–1.24)
GFR calc Af Amer: >60 mL/min (ref >60)
GFR calc non Af Amer: >60 mL/min (ref >60)
Glucose, Bld: 170 mg/dL — ABNORMAL HIGH (ref 70–99)
Potassium: 3.4 mmol/L — ABNORMAL LOW (ref 3.5–5.1)
Sodium: 136 mmol/L (ref 135–145)
Total Bilirubin: 0.9 mg/dL (ref 0.3–1.2)
Total Protein: 6 g/dL — ABNORMAL LOW (ref 6.5–8.1)

## 2019-05-11 LAB — CBC WITH DIFFERENTIAL/PLATELET
Abs Immature Granulocytes: 0.03 10*3/uL (ref 0.00–0.07)
Basophils Absolute: 0 10*3/uL (ref 0.0–0.1)
Basophils Relative: 0 %
Eosinophils Absolute: 0 10*3/uL (ref 0.0–0.5)
Eosinophils Relative: 0 %
HCT: 36 % — ABNORMAL LOW (ref 39.0–52.0)
Hemoglobin: 11.9 g/dL — ABNORMAL LOW (ref 13.0–17.0)
Immature Granulocytes: 0 %
Lymphocytes Relative: 34 %
Lymphs Abs: 3.1 10*3/uL (ref 0.7–4.0)
MCH: 31 pg (ref 26.0–34.0)
MCHC: 33.1 g/dL (ref 30.0–36.0)
MCV: 93.8 fL (ref 80.0–100.0)
Monocytes Absolute: 0.5 10*3/uL (ref 0.1–1.0)
Monocytes Relative: 5 %
Neutro Abs: 5.7 10*3/uL (ref 1.7–7.7)
Neutrophils Relative %: 61 %
Platelets: 177 10*3/uL (ref 150–400)
RBC: 3.84 MIL/uL — ABNORMAL LOW (ref 4.22–5.81)
RDW: 12.8 % (ref 11.5–15.5)
WBC: 9.3 10*3/uL (ref 4.0–10.5)
nRBC: 0 % (ref 0.0–0.2)

## 2019-05-11 LAB — PROTIME-INR
INR: 1.2 (ref 0.8–1.2)
Prothrombin Time: 14.7 seconds (ref 11.4–15.2)

## 2019-05-11 LAB — APTT: aPTT: 27 seconds (ref 24–36)

## 2019-05-11 LAB — CARBAMAZEPINE LEVEL, TOTAL: Carbamazepine Lvl: 9 ug/mL (ref 4.0–12.0)

## 2019-05-11 LAB — POC OCCULT BLOOD, ED: Fecal Occult Bld: POSITIVE — AB

## 2019-05-11 MED ORDER — OMEPRAZOLE 20 MG PO CPDR: 20.0000 mg | DELAYED_RELEASE_CAPSULE | Freq: Every day | ORAL | 0 refills | Status: DC

## 2019-05-11 MED ORDER — NALOXONE HCL 0.4 MG/ML IJ SOLN: 0.4000 mg | Freq: Once | INTRAMUSCULAR | Status: DC

## 2019-05-11 NOTE — ED Notes (Signed)
Brief changed, skin care provided. New brief and new paper scrub paints applied. Pt pants placed in a belonging bag with stickers placed on bag.

## 2019-05-11 NOTE — ED Provider Notes (Signed)
4:50 PM-checkout from Dr. Maryan Rued to evaluate after CT imaging and consider disposition.  Patient here for evaluation of vomiting which was black in color at his facility.  He is currently being managed for COVID-19 infection.  He has not had a respiratory distress or require respiratory support of a nature.  Ct Head Wo Contrast  Result Date: 05/11/2019 CLINICAL DATA:  Patient was found down. EXAM: CT HEAD WITHOUT CONTRAST TECHNIQUE: Contiguous axial images were obtained from the base of the skull through the vertex without intravenous contrast. COMPARISON:  CT scan dated 02/10/2019 FINDINGS: Brain: There is no acute infarction or hemorrhage. Diffuse cerebral cortical atrophy with secondary slight ventricular dilatation, stable. Vascular: No hyperdense vessel or unexpected calcification. Skull: Normal. Negative for fracture or focal lesion. Sinuses/Orbits: Normal. Other: None IMPRESSION: No acute abnormalities. Diffuse atrophy. Electronically Signed   By: [name] M.D.   On: 05/11/2019 19:08   Dg Chest Port 1 View  Result Date: 05/11/2019 CLINICAL DATA:  Fall, dark emesis EXAM: PORTABLE CHEST 1 VIEW COMPARISON:  April 07, 2018 FINDINGS: New patchy density at the right lung base. No pleural effusion. Stable cardiomediastinal contours with normal heart size. IMPRESSION: Patchy density at the right lung base suspicious for pneumonia. Electronically Signed   By: Macy Mis M.D.   On: 05/11/2019 13:43    Patient Vitals for the past 24 hrs:  BP Temp Temp src Pulse Resp SpO2  05/11/19 1830 (!) 146/62 - - 84 18 97 %  05/11/19 1700 - - - - 18 -  05/11/19 1630 (!) 119/58 - - 73 - 96 %  05/11/19 1400 (!) 115/56 - - 68 18 97 %  05/11/19 1330 (!) 115/53 - - 70 - 99 %  05/11/19 1300 (!) 110/52 - - 68 - 99 %  05/11/19 1221 (!) 111/52 98.4 F (36.9 C) Oral 72 16 97 %    7:57 PM Reevaluation with update and discussion. After initial assessment and treatment, an updated evaluation reveals no  change in clinical status.  Findings have been discussed with the patient's brother. Daleen Bo   Medical Decision Making: Patient with fall, also have evidence for upper GI bleeding.  He has hemoglobin is improved from baseline, recently and he is hemodynamic stable.  He has COVID-19 infection, but he is not respiratory compromised.  He is stable for discharge with outpatient management.  He has been advised to stop taking Celebrex, and prescriptions been written for omeprazole.  Hayden [name] was evaluated in Emergency Department on 05/11/2019 for the symptoms described in the history of present illness. He was evaluated in the context of the global COVID-19 pandemic, which necessitated consideration that the patient might be at risk for infection with the SARS-CoV-2 virus that causes COVID-19. Institutional protocols and algorithms that pertain to the evaluation of patients at risk for COVID-19 are in a state of rapid change based on information released by regulatory bodies including the CDC and federal and state organizations. These policies and algorithms were followed during the patient's care in the ED  CRITICAL CARE-no Performed by: Daleen Bo  Nursing Notes Reviewed/ Care Coordinated Applicable Imaging Reviewed Interpretation of Laboratory Data incorporated into ED treatment  The patient appears reasonably screened and/or stabilized for discharge and I doubt any other medical condition or other [facility] requiring further screening, evaluation, or treatment in the ED at this time prior to discharge.  Plan: Home Medications-continue current, except Celebrex, avoid NSAIDs; Home Treatments-rest, fluids; return here if the recommended treatment,  does not improve the symptoms; Recommended follow up-PCP 1 week and as needed    Daleen Bo, MD 05/11/19 2010

## 2019-05-11 NOTE — ED Notes (Signed)
[facility] called regarding transport time. Unable to give ETA due to large call load.

## 2019-05-11 NOTE — Discharge Instructions (Addendum)
No drop in hemoglobin today but stool test positive.  Normal head CT and labs otherwise.  Pt is wake and following instructions at this time.  Start anti-acid medication and follow up with facility doctor.  Make sure he is not taking any anti-inflammatory medicines including the previously prescribed Celebrex.

## 2019-05-11 NOTE — ED Notes (Signed)
[facility] called  

## 2019-05-11 NOTE — ED Provider Notes (Signed)
[facility] DEPT Provider Note   CSN: [id number] Arrival date & time: 05/11/19  1120     History   Chief Complaint No chief complaint on file.   HPI Hayden [name] is a 78 y.o. male.     Patient is a 78 year old male with a history of GERD, hyperlipidemia, diabetes, dementia who lives in a memory care unit with recent fall and femur fracture with nailing in August presenting today from facility due to a fall and desire to be evaluated for GI bleeding.  Staff reports he had a episode of emesis yesterday that was dark in appearance and today they found him sitting on the floor next to his bed.  There was no report of any change in mental status and patient has had no recent medication changes.  Patient did test positive for Covid last week but has not had any cough, fever, congestion or shortness of breath.  Patient refuses to open his eyes or answer any questions on my exam.  The history is provided by the EMS personnel and the nursing home.    Past Medical History:  Diagnosis Date  . Candidiasis   . Depression   . Diabetes mellitus   . Enlarged prostate   . Fibromyalgia   . GERD (gastroesophageal reflux disease)    reflux intermittent  . Hyperlipidemia   . 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 Leukemia [facility]) chronic lymphocytic   2008 diagnosed-monitored Dr     Patient Active Problem List   Diagnosis Date Noted  . Closed fracture of right hip ([place])   . Femur fracture, right ([place]) 02/10/2019  . Fall   . Acute encephalopathy 07/02/2018  . Dementia without behavioral disturbance ([place]) 05/24/2018  . Pressure injury of skin 05/23/2018  . Cellulitis 04/17/2018  . Cellulitis, scrotum 03/09/2018  . Cellulitis of male genitalia 03/08/2018  . Depression 03/08/2018  . BPH (benign prostatic hyperplasia) 03/08/2018  . Leukocytosis 03/08/2018  . Mild dementia ([place]) 10/23/2017  . Lower urinary tract  infectious disease 09/06/2017  . Hyperglycemia 09/06/2017  . ARF (acute renal failure) ([place]) 09/06/2017  . Altered mental status 09/06/2017  . Type 2 diabetes mellitus without complication, with long-term current use of insulin ([place]) 04/02/2017  . 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 Bilateral hand pain 12/09/2016  . 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 ADENOIDECTOMY    . CARPAL TUNNEL RELEASE     rt hand  . CATARACT EXTRACTION, BILATERAL    . CERVICAL FUSION  1998  . CYSTOSCOPY  07/26/2012   Procedure: CYSTOSCOPY;  Surgeon: Ailene Rud, MD;  Location: [facility];  Service: Urology;  Laterality: N/A;  DIAGNOSTIC CYSTO PROSTATE ULTRASOUND      . FEMUR IM NAIL Right 02/10/2019   Procedure: INTRAMEDULLARY (IM) NAIL FEMORAL;  Surgeon: Hiram Gash, MD;  Location: [facility];  Service: Orthopedics;  Laterality: Right;  . PROSTATE BIOPSY  07/26/2012   Procedure: PROSTATE BIOPSY;  Surgeon: Ailene Rud, MD;  Location: [facility];  Service: Urology;  Laterality: N/A;  . retinal micro aneurysms    . tonsil    . VASECTOMY    . VITRECTOMY          Home Medications    Prior to Admission medications   Medication Sig Start Date  End Date Taking? Authorizing Provider  alum & mag hydroxide-simeth (MAALOX/MYLANTA) 200-200-20 MG/5ML suspension Take 30 mLs by mouth every 6 (six) hours as needed for indigestion or heartburn. Do not exceed 4 doses in 24 hours    [provider]  aspirin 81 MG chewable tablet Chew 81 mg by mouth daily.    [provider]  carbamazepine (TEGRETOL) 200 MG tablet Take 200-300 mg by mouth See admin instructions. Take 300 mg by mouth in the morning and take 200 mg by mouth at bedtime    [provider]  celecoxib (CELEBREX) 200 MG capsule Take 1 capsule (200 mg total) by  mouth 2 (two) times daily. 02/16/19   Eugenie Filler, MD  docusate sodium (COLACE) 100 MG capsule Take 100 mg by mouth daily.     [provider]  donepezil (ARICEPT) 10 MG tablet Take 1 tablet (10 mg total) by mouth at bedtime. 05/03/18   Cameron Sprang, MD  enoxaparin (LOVENOX) 40 MG/0.4ML injection Inject 0.4 mLs (40 mg total) into the skin daily. 02/11/19 03/13/19  Hiram Gash, MD  EPINEPHrine (EPIPEN JR) 0.15 MG/0.3ML injection Inject 0.15 mg into the muscle as needed for anaphylaxis.    [provider]  guaiFENesin (ROBITUSSIN) 100 MG/5ML liquid Take 200 mg by mouth every 6 (six) hours as needed for cough.    [provider]  insulin lispro (HUMALOG) 100 UNIT/ML KwikPen Inject 6 Units into the skin 3 (three) times daily with meals. 10/11/18   [provider]  LEVEMIR FLEXTOUCH 100 UNIT/ML Pen Inject 24 Units into the skin at bedtime. 02/16/19   Eugenie Filler, MD  Lidocaine HCl (ASPERCREME W/LIDOCAINE) 4 % CREA Apply 1 application topically 2 (two) times daily.     [provider]  loperamide (IMODIUM) 2 MG capsule Take 2 mg by mouth See admin instructions. Take 2 mg by mouth with each loose stool as needed for diarrhea (do not exceed 8 doses in 24 hours)    [provider]  LORazepam (ATIVAN) 1 MG tablet Take 1 tablet (1 mg total) by mouth every 8 (eight) hours as needed for anxiety (agitation). 02/16/19   Eugenie Filler, MD  lovastatin (MEVACOR) 20 MG tablet Take 20 mg by mouth daily.    [provider]  magnesium hydroxide (MILK OF MAGNESIA) 400 MG/5ML suspension Take 30 mLs by mouth at bedtime as needed for mild constipation.    [provider]  neomycin-bacitracin-polymyxin (NEOSPORIN) [phone] ointment Apply 1 application topically See admin instructions. *Standing order for skin tear, abrasions* Clean area with normal saline. Apply tao, cover with bandaid or gauze and tape. Change as needed until healed.     [provider]  nystatin cream (MYCOSTATIN) Apply to affected area 2 times daily Patient not taking: Reported on 01/13/2019 04/23/18   [name], PA-C  oxyCODONE (OXY IR/ROXICODONE) 5 MG immediate release tablet Take 0.5-1 pills every 4 hrs as needed for pain 02/11/19   Hiram Gash, MD  risperiDONE (RISPERDAL) 1 MG tablet Take 1 mg by mouth 2 (two) times a day. 09/21/18   [provider]  Skin Protectants, Misc. (DIMETHICONE-ZINC OXIDE) cream Apply 1 application topically See admin instructions. Apply to buttocks and scrotum with each incontinence episode as needed    [provider]  tamsulosin (FLOMAX) 0.4 MG CAPS capsule Take 0.4 mg by mouth every evening.     [provider]  traZODone (DESYREL) 100 MG tablet Take 200 mg by mouth  at bedtime.     [provider]  calcium citrate-vitamin D (CITRACAL+D) 315-200 MG-UNIT per tablet Take 1 tablet by mouth daily.  09/04/11  [provider]  terazosin (HYTRIN) 5 MG capsule Take 5 mg by mouth at bedtime.  09/04/11  [provider]    Family History Family History  Problem Relation Age of Onset  . Diabetes Father   . Diabetes Sister   . Diabetes Brother     Social History Social History   Tobacco Use  . Smoking status: Never Smoker  . Smokeless tobacco: Never Used  Substance Use Topics  . Alcohol use: No    Frequency: Never  . Drug use: No     Allergies   Bee venom, Glucophage [metformin], Shellfish allergy, Grass extracts [gramineae pollens], and Tobacco [tobacco]   Review of Systems Review of Systems  Unable to perform ROS: Dementia     Physical Exam Updated Vital Signs BP (!) 115/56   Pulse 68   Temp 98.4 F (36.9 C) (Oral)   Resp 18   SpO2 97%   Physical Exam Vitals signs and nursing note reviewed.  Constitutional:      General: He is not in acute distress.    Appearance: He is well-developed.     Comments: Sleeping and refusing to open eyes  HENT:      Head: Normocephalic and atraumatic.     Comments: No signs of head or neck injury    Mouth/Throat:     Comments: Poor dentition and moist mucous membranes Eyes:     Conjunctiva/sclera: Conjunctivae normal.     Pupils: Pupils are equal, round, and reactive to light.  Neck:     Musculoskeletal: Normal range of motion and neck supple. No neck rigidity or muscular tenderness.  Cardiovascular:     Rate and Rhythm: Normal rate and regular rhythm.     Heart sounds: No murmur.  Pulmonary:     Effort: Pulmonary effort is normal. No respiratory distress.     Breath sounds: Normal breath sounds. No wheezing or rales.  Abdominal:     General: There is no distension.     Palpations: Abdomen is soft.     Tenderness: There is no abdominal tenderness. There is no guarding or rebound.  Genitourinary:    Rectum: Guaiac result positive.     Comments: Stool is a brown but Hemoccult positive Musculoskeletal: Normal range of motion.        General: No tenderness.     Right lower leg: No edema.     Left lower leg: No edema.  Skin:    General: Skin is warm and dry.     Findings: No erythema or rash.  Neurological:     Comments: Patient is sleeping on exam however when stating his name he stops snoring but when asking him to do anything he resists.  He resists me opening his eyes.  He is in no acute distress at this time.  Psychiatric:     Comments: Calm and cooperative      ED Treatments / Results  Labs (all labs ordered are listed, but only abnormal results are displayed) Labs Reviewed  CBC WITH DIFFERENTIAL/PLATELET - Abnormal; Notable for the following components:      Result Value   RBC 3.84 (*)    Hemoglobin 11.9 (*)    HCT 36.0 (*)    All other components within normal limits  COMPREHENSIVE METABOLIC PANEL - Abnormal; Notable for the following components:  Potassium 3.4 (*)    CO2 21 (*)    Glucose, Bld 170 (*)    BUN 28 (*)    Calcium 7.9 (*)    Total Protein 6.0 (*)    All  other components within normal limits  POC OCCULT BLOOD, ED - Abnormal; Notable for the following components:   Fecal Occult Bld POSITIVE (*)    All other components within normal limits  PROTIME-INR  APTT  CARBAMAZEPINE LEVEL, TOTAL    EKG None  Radiology Ct Head Wo Contrast  Result Date: 05/11/2019 CLINICAL DATA:  Patient was found down. EXAM: CT HEAD WITHOUT CONTRAST TECHNIQUE: Contiguous axial images were obtained from the base of the skull through the vertex without intravenous contrast. COMPARISON:  CT scan dated 02/10/2019 FINDINGS: Brain: There is no acute infarction or hemorrhage. Diffuse cerebral cortical atrophy with secondary slight ventricular dilatation, stable. Vascular: No hyperdense vessel or unexpected calcification. Skull: Normal. Negative for fracture or focal lesion. Sinuses/Orbits: Normal. Other: None IMPRESSION: No acute abnormalities. Diffuse atrophy. Electronically Signed   By: [name] M.D.   On: 05/11/2019 19:08   Dg Chest Port 1 View  Result Date: 05/11/2019 CLINICAL DATA:  Fall, dark emesis EXAM: PORTABLE CHEST 1 VIEW COMPARISON:  April 07, 2018 FINDINGS: New patchy density at the right lung base. No pleural effusion. Stable cardiomediastinal contours with normal heart size. IMPRESSION: Patchy density at the right lung base suspicious for pneumonia. Electronically Signed   By: Macy Mis M.D.   On: 05/11/2019 13:43    Procedures Procedures (including critical care time)  Medications Ordered in ED Medications - No data to display   Initial Impression / Assessment and Plan / ED Course  I have reviewed the triage vital signs and the nursing notes.  Pertinent labs & imaging results that were available during my care of the patient were reviewed by me and considered in my medical decision making (see chart for details).  Clinical Course as of May 11 714  Wed May 11, 2019  2002 I have updated the patient's brother [name] on the status and his  stability and the discharge planning.  He was agreeable.   [EW]    Clinical Course User Index [EW] Daleen Bo, MD       Elderly male presenting today from memory care unit to be evaluated for possible GI bleed.  Patient takes no anticoagulation but does have a history of GERD.  It was reported by the facility that he had an episode of dark emesis yesterday.  Patient has had no vomiting this morning but was found sitting on the floor by his bed this morning with unwitnessed potential fall.  Patient is refusing to speak to me at this time however seems more volitional as he is refusing to open his eyes and resists when I attempt to move his extremities. Of note patient did test positive for Covid last week but there have been no reports of any cough, congestion or shortness of breath.  He is in no acute distress at this time.  Stool was brown on exam but is Hemoccult positive.  He has no evidence of external injury.  Labs pending  2:17 PM C-spine cleared and patient has no obvious sign of injury to his head.  Labs are consistent with a hemoglobin of 11.9 which is significantly improved after his femur surgery in August.  Hemoccult was positive but stool is brown and no gross blood.  CMP without significant changes with persistent hypocalcemia.  At this time patient's chest x-ray did show possible pneumonia pneumonia in the right base however with his history of Covid most likely Covid related.  Patient is currently breathing less than 20 times a minute and satting 97% on room air.    2:31 PM When speaking with the facility they states he is not at his baseline today.  Usually he is able to feed himself and roll around in his wheelchair but today unable to do any of this.  Will get head CT to further eval.  Also tegretol level pending.  2:57 PM c-collar removed and pt is now awake, alert and speaking.  Moving all ext and in no acute distress.   Final Clinical Impressions(s) / ED Diagnoses    Final diagnoses:  COVID-19  Unwitnessed fall  Gastrointestinal hemorrhage, unspecified gastrointestinal hemorrhage type    ED Discharge Orders    None       Blanchie Dessert, MD 05/12/19 [phone]

## 2019-05-11 NOTE — ED Triage Notes (Signed)
Per EMS, pt from [facility] memory care unit. They were initially called out for a fall, he was found by staff on the floor beside his bed. EMS was then told the pt vomited black emesis last night and the facility wanted him evaluated for GI bleed.   Pt also tested positive for COVID 19 one week ago. Staff deny pt has cough, shortness of breath, or fever. Pt was given 450 cc NS. Pt placed in c-collar for unwitnessed fall. Hx dementia.  BP 96/50, 92/46 after starting fluids. HR 74 O2 97% on RA CBG 199 Temp 98.2

## 2019-05-12 NOTE — ED Notes (Signed)
Pts brief changed for transport back to [facility].

## 2019-05-12 NOTE — ED Notes (Signed)
Communications called regarding transportation. Communications was able to give an ETA, however he is still on the list.

## 2019-05-13 ENCOUNTER — Inpatient Hospital Stay (HOSPITAL_COMMUNITY)
Admission: EM | Admit: 2019-05-13 | Discharge: 2019-05-19 | DRG: 177 | Disposition: A | Discharge status: Skilled Nursing Facility | Payer: Medicare Other | Source: Skilled Nursing Facility | Attending: Internal Medicine | Admitting: Internal Medicine

## 2019-05-13 ENCOUNTER — Other Ambulatory Visit: Payer: Self-pay

## 2019-05-13 ENCOUNTER — Emergency Department (HOSPITAL_COMMUNITY): Payer: Medicare Other

## 2019-05-13 DIAGNOSIS — R1110 Vomiting, unspecified: Secondary | ICD-10-CM | POA: Diagnosis present

## 2019-05-13 DIAGNOSIS — K922 Gastrointestinal hemorrhage, unspecified: Secondary | ICD-10-CM | POA: Diagnosis present

## 2019-05-13 DIAGNOSIS — D649 Anemia, unspecified: Secondary | ICD-10-CM | POA: Diagnosis present

## 2019-05-13 DIAGNOSIS — Z7982 Long term (current) use of aspirin: Secondary | ICD-10-CM

## 2019-05-13 DIAGNOSIS — Z9109 Other allergy status, other than to drugs and biological substances: Secondary | ICD-10-CM

## 2019-05-13 DIAGNOSIS — Z66 Do not resuscitate: Secondary | ICD-10-CM | POA: Diagnosis present

## 2019-05-13 DIAGNOSIS — Z981 Arthrodesis status: Secondary | ICD-10-CM

## 2019-05-13 DIAGNOSIS — R402140 Coma scale, eyes open, spontaneous, unspecified time: Secondary | ICD-10-CM | POA: Diagnosis present

## 2019-05-13 DIAGNOSIS — J1282 Pneumonia due to coronavirus disease 2019: Secondary | ICD-10-CM

## 2019-05-13 DIAGNOSIS — R000 Tachycardia, unspecified: Secondary | ICD-10-CM | POA: Diagnosis not present

## 2019-05-13 DIAGNOSIS — F329 Major depressive disorder, single episode, unspecified: Secondary | ICD-10-CM | POA: Diagnosis present

## 2019-05-13 DIAGNOSIS — R0602 Shortness of breath: Secondary | ICD-10-CM

## 2019-05-13 DIAGNOSIS — U071 COVID-19: Principal | ICD-10-CM

## 2019-05-13 DIAGNOSIS — J1289 Other viral pneumonia: Secondary | ICD-10-CM | POA: Diagnosis present

## 2019-05-13 DIAGNOSIS — R402360 Coma scale, best motor response, obeys commands, unspecified time: Secondary | ICD-10-CM | POA: Diagnosis present

## 2019-05-13 DIAGNOSIS — Z9981 Dependence on supplemental oxygen: Secondary | ICD-10-CM | POA: Diagnosis not present

## 2019-05-13 DIAGNOSIS — M797 Fibromyalgia: Secondary | ICD-10-CM | POA: Diagnosis present

## 2019-05-13 DIAGNOSIS — Z833 Family history of diabetes mellitus: Secondary | ICD-10-CM

## 2019-05-13 DIAGNOSIS — J690 Pneumonitis due to inhalation of food and vomit: Secondary | ICD-10-CM | POA: Diagnosis present

## 2019-05-13 DIAGNOSIS — F0390 Unspecified dementia without behavioral disturbance: Secondary | ICD-10-CM | POA: Diagnosis not present

## 2019-05-13 DIAGNOSIS — J9601 Acute respiratory failure with hypoxia: Secondary | ICD-10-CM | POA: Diagnosis not present

## 2019-05-13 DIAGNOSIS — I82441 Acute embolism and thrombosis of right tibial vein: Secondary | ICD-10-CM | POA: Diagnosis present

## 2019-05-13 DIAGNOSIS — Z794 Long term (current) use of insulin: Secondary | ICD-10-CM

## 2019-05-13 DIAGNOSIS — E785 Hyperlipidemia, unspecified: Secondary | ICD-10-CM | POA: Diagnosis present

## 2019-05-13 DIAGNOSIS — E876 Hypokalemia: Secondary | ICD-10-CM | POA: Diagnosis present

## 2019-05-13 DIAGNOSIS — I10 Essential (primary) hypertension: Secondary | ICD-10-CM | POA: Diagnosis present

## 2019-05-13 DIAGNOSIS — N400 Enlarged prostate without lower urinary tract symptoms: Secondary | ICD-10-CM | POA: Diagnosis present

## 2019-05-13 DIAGNOSIS — Z515 Encounter for palliative care: Secondary | ICD-10-CM

## 2019-05-13 DIAGNOSIS — E119 Type 2 diabetes mellitus without complications: Secondary | ICD-10-CM | POA: Diagnosis not present

## 2019-05-13 DIAGNOSIS — C9110 Chronic lymphocytic leukemia of B-cell type not having achieved remission: Secondary | ICD-10-CM | POA: Diagnosis present

## 2019-05-13 DIAGNOSIS — K219 Gastro-esophageal reflux disease without esophagitis: Secondary | ICD-10-CM | POA: Diagnosis not present

## 2019-05-13 DIAGNOSIS — Z91030 Bee allergy status: Secondary | ICD-10-CM

## 2019-05-13 DIAGNOSIS — I2699 Other pulmonary embolism without acute cor pulmonale: Secondary | ICD-10-CM | POA: Diagnosis present

## 2019-05-13 DIAGNOSIS — Z888 Allergy status to other drugs, medicaments and biological substances status: Secondary | ICD-10-CM

## 2019-05-13 DIAGNOSIS — Z79899 Other long term (current) drug therapy: Secondary | ICD-10-CM | POA: Diagnosis not present

## 2019-05-13 DIAGNOSIS — R402250 Coma scale, best verbal response, oriented, unspecified time: Secondary | ICD-10-CM | POA: Diagnosis present

## 2019-05-13 LAB — COMPREHENSIVE METABOLIC PANEL
ALT: 34 U/L (ref 0–44)
AST: 33 U/L (ref 15–41)
Albumin: 3.4 g/dL — ABNORMAL LOW (ref 3.5–5.0)
Alkaline Phosphatase: 98 U/L (ref 38–126)
Anion gap: 12 (ref 5–15)
BUN: 25 mg/dL — ABNORMAL HIGH (ref 8–23)
CO2: 24 mmol/L (ref 22–32)
Calcium: 8.9 mg/dL (ref 8.9–10.3)
Chloride: 105 mmol/L (ref 98–111)
Creatinine, Ser: 0.67 mg/dL (ref 0.61–1.24)
GFR calc Af Amer: >60 mL/min (ref >60)
GFR calc non Af Amer: >60 mL/min (ref >60)
Glucose, Bld: 168 mg/dL — ABNORMAL HIGH (ref 70–99)
Potassium: 3.6 mmol/L (ref 3.5–5.1)
Sodium: 141 mmol/L (ref 135–145)
Total Bilirubin: 0.7 mg/dL (ref 0.3–1.2)
Total Protein: 6.6 g/dL (ref 6.5–8.1)

## 2019-05-13 LAB — I-STAT CHEM 8, ED
BUN: 34 mg/dL — ABNORMAL HIGH (ref 8–23)
Calcium, Ion: 1.12 mmol/L — ABNORMAL LOW (ref 1.15–1.40)
Chloride: 104 mmol/L (ref 98–111)
Creatinine, Ser: 0.6 mg/dL — ABNORMAL LOW (ref 0.61–1.24)
Glucose, Bld: 211 mg/dL — ABNORMAL HIGH (ref 70–99)
HCT: 41 % (ref 39.0–52.0)
Hemoglobin: 13.9 g/dL (ref 13.0–17.0)
Potassium: 4.2 mmol/L (ref 3.5–5.1)
Sodium: 141 mmol/L (ref 135–145)
TCO2: 27 mmol/L (ref 22–32)

## 2019-05-13 LAB — CBC WITH DIFFERENTIAL/PLATELET
Abs Immature Granulocytes: 0.08 10*3/uL — ABNORMAL HIGH (ref 0.00–0.07)
Basophils Absolute: 0 10*3/uL (ref 0.0–0.1)
Basophils Relative: 0 %
Eosinophils Absolute: 0 10*3/uL (ref 0.0–0.5)
Eosinophils Relative: 0 %
HCT: 39.8 % (ref 39.0–52.0)
Hemoglobin: 13.3 g/dL (ref 13.0–17.0)
Immature Granulocytes: 1 %
Lymphocytes Relative: 33 %
Lymphs Abs: 3.6 10*3/uL (ref 0.7–4.0)
MCH: 30.9 pg (ref 26.0–34.0)
MCHC: 33.4 g/dL (ref 30.0–36.0)
MCV: 92.3 fL (ref 80.0–100.0)
Monocytes Absolute: 0.3 10*3/uL (ref 0.1–1.0)
Monocytes Relative: 3 %
Neutro Abs: 6.8 10*3/uL (ref 1.7–7.7)
Neutrophils Relative %: 63 %
Platelets: 195 10*3/uL (ref 150–400)
RBC: 4.31 MIL/uL (ref 4.22–5.81)
RDW: 12.5 % (ref 11.5–15.5)
WBC: 10.7 10*3/uL — ABNORMAL HIGH (ref 4.0–10.5)
nRBC: 0 % (ref 0.0–0.2)

## 2019-05-13 LAB — C-REACTIVE PROTEIN: CRP: 25.7 mg/dL — ABNORMAL HIGH (ref <1.0)

## 2019-05-13 LAB — LACTATE DEHYDROGENASE: LDH: 298 U/L — ABNORMAL HIGH (ref 98–192)

## 2019-05-13 LAB — D-DIMER, QUANTITATIVE: D-Dimer, Quant: >20 ug/mL-FEU — ABNORMAL HIGH (ref 0.00–0.50)

## 2019-05-13 LAB — TRIGLYCERIDES: Triglycerides: 115 mg/dL (ref <150)

## 2019-05-13 LAB — PROCALCITONIN: Procalcitonin: <0.1 ng/mL

## 2019-05-13 LAB — FERRITIN: Ferritin: 311 ng/mL (ref 24–336)

## 2019-05-13 LAB — LACTIC ACID, PLASMA: Lactic Acid, Venous: 1.9 mmol/L (ref 0.5–1.9)

## 2019-05-13 LAB — FIBRINOGEN: Fibrinogen: 593 mg/dL — ABNORMAL HIGH (ref 210–475)

## 2019-05-13 MED ORDER — ACETAMINOPHEN 650 MG RE SUPP
650.0000 mg | Freq: Four times a day (QID) | RECTAL | Status: DC | PRN
  Administered 2019-05-13: 650 mg via RECTAL
  Filled 2019-05-13: qty 1

## 2019-05-13 MED ORDER — FLEET ENEMA 7-19 GM/118ML RE ENEM: 1.0000 | ENEMA | Freq: Once | RECTAL | Status: DC | PRN

## 2019-05-13 MED ORDER — SORBITOL 70 % SOLN
30.0000 mL | Freq: Every day | Status: DC | PRN
  Filled 2019-05-13: qty 30

## 2019-05-13 MED ORDER — SODIUM CHLORIDE 0.9 % IV SOLN
3.0000 g | Freq: Once | INTRAVENOUS | Status: AC
  Administered 2019-05-13: 3 g via INTRAVENOUS
  Filled 2019-05-13: qty 8

## 2019-05-13 MED ORDER — ACETAMINOPHEN 325 MG PO TABS
650.0000 mg | ORAL_TABLET | Freq: Four times a day (QID) | ORAL | Status: DC | PRN
  Administered 2019-05-15: 650 mg via ORAL
  Filled 2019-05-13 (×2): qty 2

## 2019-05-13 MED ORDER — SENNA 8.6 MG PO TABS
1.0000 | ORAL_TABLET | Freq: Two times a day (BID) | ORAL | Status: DC
  Administered 2019-05-14 – 2019-05-19 (×11): 8.6 mg via ORAL
  Filled 2019-05-13 (×12): qty 1

## 2019-05-13 MED ORDER — MAGNESIUM HYDROXIDE 400 MG/5ML PO SUSP: 30.0000 mL | Freq: Every day | ORAL | Status: DC | PRN

## 2019-05-13 MED ORDER — SODIUM CHLORIDE 0.9 % IV SOLN
INTRAVENOUS | Status: DC
  Administered 2019-05-13 – 2019-05-14 (×2): via INTRAVENOUS

## 2019-05-13 MED ORDER — SODIUM CHLORIDE 0.9% FLUSH
3.0000 mL | Freq: Two times a day (BID) | INTRAVENOUS | Status: DC
  Administered 2019-05-13 – 2019-05-19 (×10): 3 mL via INTRAVENOUS

## 2019-05-13 MED ORDER — ENOXAPARIN SODIUM 40 MG/0.4ML ~~LOC~~ SOLN
40.0000 mg | Freq: Two times a day (BID) | SUBCUTANEOUS | Status: DC
  Administered 2019-05-14: 40 mg via SUBCUTANEOUS
  Filled 2019-05-13 (×2): qty 0.4

## 2019-05-13 MED ORDER — HEPARIN SODIUM (PORCINE) 5000 UNIT/ML IJ SOLN: 5000.0000 [IU] | Freq: Three times a day (TID) | INTRAMUSCULAR | Status: DC

## 2019-05-13 MED ORDER — ONDANSETRON HCL 4 MG PO TABS: 4.0000 mg | ORAL_TABLET | Freq: Four times a day (QID) | ORAL | Status: DC | PRN

## 2019-05-13 MED ORDER — ONDANSETRON HCL 4 MG/2ML IJ SOLN: 4.0000 mg | Freq: Four times a day (QID) | INTRAMUSCULAR | Status: DC | PRN

## 2019-05-13 MED ORDER — SODIUM CHLORIDE 0.9 % IV SOLN
3.0000 g | Freq: Four times a day (QID) | INTRAVENOUS | Status: AC
  Administered 2019-05-13 – 2019-05-18 (×20): 3 g via INTRAVENOUS
  Filled 2019-05-13 (×6): qty 3
  Filled 2019-05-13 (×3): qty 8
  Filled 2019-05-13 (×3): qty 3
  Filled 2019-05-13: qty 8
  Filled 2019-05-13 (×5): qty 3
  Filled 2019-05-13 (×2): qty 8
  Filled 2019-05-13 (×2): qty 3

## 2019-05-13 NOTE — Progress Notes (Signed)
78 yo M with COVID-19, seen in ED by my partner, had episode of vomiting and aspiration in ED.  Tm 103.3.  Started on unasyn for concern of aspiration PNA.  Not started on steroids or remdesivir since he wasn't requiring O2 prior to the aspiration event.  Now requiring 8L.  WBC 10.7, procalcitonin is negative, D.Dimer has come back >20, presumably resulted since my partner saw him in ED.  Given the uncertainty regarding exact diagnosis (as above) and an extremely high D.dimer; will order CTA PE study stat.  Creat is 0.67.

## 2019-05-13 NOTE — ED Notes (Addendum)
RN attempted to start IV but was unsuccessful. STAT IV team order placed.

## 2019-05-13 NOTE — ED Provider Notes (Signed)
[facility] DEPT Provider Note   CSN: [id number] Arrival date & time: 05/13/19  [id number]     History   Chief Complaint No chief complaint on file.   HPI Hayden [name] is a 78 y.o. male.     78 year old male with history of dementia presents from nursing home due to recent COVID-19 infection.  No reported vomiting or diarrhea.  He has had no new oxygen requirement.  Was sent here for possible dehydration.  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[facility]) chronic lymphocytic   2008 diagnosed-monitored Dr     Patient Active Problem List   Diagnosis Date Noted   Closed fracture of right hip ([place])    Femur fracture, right ([place]) 02/10/2019   Fall    Acute encephalopathy 07/02/2018   Dementia without behavioral disturbance ([place]) 05/24/2018   Pressure injury of skin 05/23/2018   Cellulitis 04/17/2018   Cellulitis, scrotum 03/09/2018   Cellulitis of male genitalia 03/08/2018   Depression 03/08/2018   BPH (benign prostatic hyperplasia) 03/08/2018   Leukocytosis 03/08/2018   Mild dementia ([place]) 10/23/2017   Lower urinary tract infectious disease 09/06/2017   Hyperglycemia 09/06/2017   ARF (acute renal failure) ([place]) 09/06/2017   Altered mental status 09/06/2017   Type 2 diabetes mellitus without complication, with long-term current use of insulin ([place]) 04/02/2017   Cubital tunnel syndrome on right 12/23/2016   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   Bilateral hand pain 12/09/2016   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    Past Surgical History:  Procedure Laterality Date     ADENOIDECTOMY     CARPAL TUNNEL RELEASE     rt hand   CATARACT EXTRACTION, BILATERAL     CERVICAL FUSION  1998   CYSTOSCOPY  07/26/2012   Procedure: CYSTOSCOPY;  Surgeon: Ailene Rud, MD;  Location: [facility];  Service: Urology;  Laterality: N/A;  DIAGNOSTIC CYSTO PROSTATE ULTRASOUND       FEMUR IM NAIL Right 02/10/2019   Procedure: INTRAMEDULLARY (IM) NAIL FEMORAL;  Surgeon: Hiram Gash, MD;  Location: [facility];  Service: Orthopedics;  Laterality: Right;   PROSTATE BIOPSY  07/26/2012   Procedure: PROSTATE BIOPSY;  Surgeon: Ailene Rud, MD;  Location: [facility];  Service: Urology;  Laterality: N/A;   retinal micro aneurysms     tonsil     VASECTOMY     VITRECTOMY          Home Medications    Prior to Admission medications   Medication Sig Start Date End Date Taking? Authorizing Provider  acetaminophen (TYLENOL) 500 MG tablet Take 500 mg by mouth every 4 (four) hours as needed for mild pain, fever or headache.    [provider]  aspirin 81 MG chewable tablet Chew 81 mg by mouth daily.    [provider]  carbamazepine (TEGRETOL) 200 MG tablet Take 300 mg by mouth 2 (two) times daily.     [provider]  Chloroxylenol-Zinc Oxide (BAZA EX) Apply 1 application topically as needed (incontinence episode). Buttocks and scrotum  [provider]  Cholecalciferol (VITAMIN D3) 1.25 MG (50000 UT) CAPS Take 1.25 mg by mouth once a week. Mondays    [provider]  donepezil (ARICEPT) 10 MG tablet Take 1 tablet (10 mg total) by mouth at bedtime. 05/03/18   Cameron Sprang, MD  enoxaparin (LOVENOX) 40 MG/0.4ML injection Inject 0.4 mLs (40 mg total) into the skin daily. 02/11/19 03/13/19  Hiram Gash, MD  EPINEPHrine (EPIPEN JR) 0.15 MG/0.3ML injection Inject 0.15 mg into the muscle as needed for anaphylaxis.    [provider]  Ergocalciferol (VITAMIN D2 PO) Take 1 capsule by  mouth daily.    [provider]  ergocalciferol (VITAMIN D2) 1.25 MG (50000 UT) capsule Take 50,000 Units by mouth once a week. Mondays    [provider]  guaiFENesin (ROBITUSSIN) 100 MG/5ML liquid Take 200 mg by mouth every 6 (six) hours as needed for cough.    [provider]  insulin lispro (HUMALOG) 100 UNIT/ML KwikPen Inject 6 Units into the skin 3 (three) times daily with meals. 10/11/18   [provider]  LEVEMIR FLEXTOUCH 100 UNIT/ML Pen Inject 24 Units into the skin at bedtime. 02/16/19   Eugenie Filler, MD  Lidocaine HCl (ASPERCREME W/LIDOCAINE) 4 % CREA Apply 1 application topically 2 (two) times daily. On back    [provider]  loperamide (IMODIUM) 2 MG capsule Take 2 mg by mouth See admin instructions. Take 2 mg by mouth with each loose stool as needed for diarrhea (do not exceed 8 doses in 24 hours)    [provider]  LORazepam (ATIVAN) 1 MG tablet Take 1 tablet (1 mg total) by mouth every 8 (eight) hours as needed for anxiety (agitation). Patient not taking: Reported on 05/11/2019 02/16/19   Eugenie Filler, MD  lovastatin (MEVACOR) 20 MG tablet Take 20 mg by mouth daily.    [provider]  magnesium hydroxide (MILK OF MAGNESIA) 400 MG/5ML suspension Take 30 mLs by mouth at bedtime as needed for mild constipation.    [provider]  neomycin-bacitracin-polymyxin (NEOSPORIN) [phone] ointment Apply 1 application topically See admin instructions. *Standing order for skin tear, abrasions* Clean area with normal saline. Apply tao, cover with bandaid or gauze and tape. Change as needed until healed.    [provider]  nystatin cream (MYCOSTATIN) Apply to affected area 2 times daily Patient not taking: Reported on 01/13/2019 04/23/18   [name], PA-C  omeprazole (PRILOSEC) 20 MG capsule Take 1 capsule (20 mg total) by mouth daily. 05/11/19   Blanchie Dessert, MD  oxyCODONE (OXY IR/ROXICODONE) 5 MG  immediate release tablet Take 0.5-1 pills every 4 hrs as needed for pain Patient taking differently: Take 2.5 mg by mouth every 4 (four) hours as needed for moderate pain.  02/11/19   Hiram Gash, MD  risperiDONE (RISPERDAL) 1 MG tablet Take 1 mg by mouth 2 (two) times a day. 09/21/18   [provider]  sertraline (ZOLOFT) 50 MG tablet Take 50 mg by mouth daily.    [provider]  Suvorexant (BELSOMRA) 5 MG TABS Take 5 mg by mouth at bedtime.    [provider]  tamsulosin (FLOMAX) 0.4 MG CAPS capsule Take 0.4 mg by mouth every evening.     [provider]  traZODone (DESYREL) 50 MG tablet Take 50 mg by mouth every 8 (eight) hours as needed (agitation).    [provider]  calcium citrate-vitamin D (CITRACAL+D) 315-200 MG-UNIT per tablet  Take 1 tablet by mouth daily.  09/04/11  [provider]  terazosin (HYTRIN) 5 MG capsule Take 5 mg by mouth at bedtime.  09/04/11  [provider]    Family History Family History  Problem Relation Age of Onset   Diabetes Father    Diabetes Sister    Diabetes Brother     Social History Social History   Tobacco Use   Smoking status: Never Smoker   Smokeless tobacco: Never Used  Substance Use Topics   Alcohol use: No    Frequency: Never   Drug use: No     Allergies   Bee venom, Glucophage [metformin], Shellfish allergy, Grass extracts [gramineae pollens], and Tobacco [tobacco]   Review of Systems Review of Systems  Unable to perform ROS: Dementia     Physical Exam Updated Vital Signs BP (!) 154/78    Pulse 90    Temp 98.1 F (36.7 C) (Oral)    Resp 20    SpO2 97%   Physical Exam Vitals signs and nursing note reviewed.  Constitutional:      General: He is not in acute distress.    Appearance: Normal appearance. He is well-developed. He is not toxic-appearing.  HENT:     Head: Normocephalic and atraumatic.  Eyes:     General: Lids are normal.      Conjunctiva/sclera: Conjunctivae normal.     Pupils: Pupils are equal, round, and reactive to light.  Neck:     Musculoskeletal: Normal range of motion and neck supple.     Thyroid: No thyroid mass.     Trachea: No tracheal deviation.  Cardiovascular:     Rate and Rhythm: Normal rate and regular rhythm.     Heart sounds: Normal heart sounds. No murmur. No gallop.   Pulmonary:     Effort: Pulmonary effort is normal. No respiratory distress.     Breath sounds: Normal breath sounds. No stridor. No decreased breath sounds, wheezing, rhonchi or rales.  Abdominal:     General: Bowel sounds are normal. There is no distension.     Palpations: Abdomen is soft.     Tenderness: There is no abdominal tenderness. There is no rebound.  Musculoskeletal: Normal range of motion.        General: No tenderness.  Skin:    General: Skin is warm and dry.     Findings: No abrasion or rash.  Neurological:     Mental Status: He is alert and oriented to person, place, and time.     GCS: GCS eye subscore is 4. GCS verbal subscore is 5. GCS motor subscore is 6.     Cranial Nerves: No cranial nerve deficit.     Sensory: No sensory deficit.  Psychiatric:        Speech: Speech normal.        Behavior: Behavior normal.      ED Treatments / Results  Labs (all labs ordered are listed, but only abnormal results are displayed) Labs Reviewed  I-STAT CHEM 8, ED    EKG None  Radiology Ct Head Wo Contrast  Result Date: 05/11/2019 CLINICAL DATA:  Patient was found down. EXAM: CT HEAD WITHOUT CONTRAST TECHNIQUE: Contiguous axial images were obtained from the base of the skull through the vertex without intravenous contrast. COMPARISON:  CT scan dated 02/10/2019 FINDINGS: Brain: There is no acute infarction or hemorrhage. Diffuse cerebral cortical atrophy with secondary slight ventricular dilatation, stable. Vascular: No hyperdense vessel or unexpected calcification. Skull: Normal. Negative  for fracture or focal  lesion. Sinuses/Orbits: Normal. Other: None IMPRESSION: No acute abnormalities. Diffuse atrophy. Electronically Signed   By: [name] M.D.   On: 05/11/2019 19:08   Dg Chest Port 1 View  Result Date: 05/11/2019 CLINICAL DATA:  Fall, dark emesis EXAM: PORTABLE CHEST 1 VIEW COMPARISON:  April 07, 2018 FINDINGS: New patchy density at the right lung base. No pleural effusion. Stable cardiomediastinal contours with normal heart size. IMPRESSION: Patchy density at the right lung base suspicious for pneumonia. Electronically Signed   By: Macy Mis M.D.   On: 05/11/2019 13:43    Procedures Procedures (including critical care time)  Medications Ordered in ED Medications - No data to display   Initial Impression / Assessment and Plan / ED Course  I have reviewed the triage vital signs and the nursing notes.  Pertinent labs & imaging results that were available during my care of the patient were reviewed by me and considered in my medical decision making (see chart for details).        Patient with possible aspiration after he had emesis while laying flat.  Chest x-ray is negative.  Throughout his stay here he became febrile more tachycardic.  Could be from Covid versus developing aspiration pneumonia.  Will be started on Unasyn.  Labs are pending at this time and Dr. Sedonia Small to follow and admit  Final Clinical Impressions(s) / ED Diagnoses   Final diagnoses:  None    ED Discharge Orders    None       Lacretia Leigh, MD 05/13/19 1529

## 2019-05-13 NOTE — ED Notes (Signed)
Patient coming from [facility] facility has no reason, other than being covid positive, for sending patient to ED-no symptoms

## 2019-05-13 NOTE — ED Triage Notes (Signed)
Pt diagnosed with COVID x2 days ago. Hx of dementia. No obvious distress. Supervisor of [facility] wanted patient to be sent to ED.

## 2019-05-13 NOTE — ED Notes (Signed)
[facility] called at this time. 

## 2019-05-13 NOTE — H&P (Signed)
[facility] History and Physical  LATISHA VANLENTEN [id number] DOB: [birth year] DOA: 05/13/2019 Referring physician: ED PCP: Patient, No Pcp Per  Chief Complaint: Covid positive from facility Came from: From facility ------------------------------------------------------------------------------------------------------ Assessment/Plan: Active Problems:   Aspiration pneumonitis ([place])  Covid positive -Evaluation was diagnosed 2 days ago with [facility] division. -No reported symptoms.  Unclear why patient was sent to the ED. -Initially at presentation patient did not have respiratory symptoms until he had aspiration event in the ED. -I would not read his respiratory symptoms as related to Covid.  So, not starting him on any steroids at this time. -Admit order placed for observation at [facility].  Aspiration pneumonitis -While in the ED, patient had an episode of vomiting and also aspirated leading to respiratory distress, tachycardia, fever up to 103.3.  He initially required 8 L oxygen by nasal cannula.  His oxygen requirement and other vital signs are progressively improving already but still remains on low-flow oxygen by nasal cannula. -He has been given IV Unasyn 1 dose in the ED.  Will schedule it for short course. -No need of IV steroids.  Dementia/fibromyalgia/depression -Home meds include Aricept, as needed risperidone, sertraline, Belsomra, as needed trazodone, as needed Ativan, Tegretol. -Resume the same.  Diabetes mellitus Home meds include Levemir 24 units at bedtime and Humalog premeal.  I will order for 15 units of Levemir tonight as well sliding scale insulin.    Mobility: PT eval. unclear baseline mobility status Diet: Cardiac/diabetic diet DVT prophylaxis:  Lovenox Code Status:  Full code.  Unable to confirm CODE STATUS from the patient at this time. Family Communication:  None present at bedside   ----------------------------------------------------------------------------------------------------- History of Present Illness: ORLANDER BIRDWELL is a 78 y.o. male a resident of [place] with history of dementia, leukemia, diabetes mellitus, hyperlipidemia, GERD, fibromyalgia Patient was reportedly diagnosed positive for Covid 2 days ago at the facility.  But he did not have any symptoms.  For some unknown reason, the supervisor at the facility called EMS and sent the patient to the ED.  Work-up showed WBC count 10.7, unremarkable BMP, CRP elevated to 25.7, lactic acid 1.9, procalcitonin less than 0.1, ferritin 311, LDH 298. Chest x-ray from 11/11 showed patchy density in the right lung base suspicious for pneumonia. Chest x-ray today showed improvement in the patchy opacity in the right lung base with mild residual opacity.  No new infiltrates.  While in the ED, patient had projectile vomiting of brown emesis.  RN immediately suctioned patient's mouth but possible aspiration was noted.  He is spiked a fever of 103.3, became tachycardic up to 120 and required up to 8 L of oxygen by nasal cannula.    He was given a dose of IV Unasyn. Hospitalist service was called for inpatient admission and management.  At the time of my evaluation, patient was lying on bed.  Alert, awake, tries to answer simple questions but not oriented to place, person or time.  Not restless or agitation.  I am not sure how much of this is different than his baseline.  Review of Systems:  All systems were reviewed and were negative unless otherwise mentioned in the HPI   Past medical history: Past Medical History:  Diagnosis Date  . Candidiasis   . Depression   . Diabetes mellitus   . Enlarged prostate   . Fibromyalgia   . GERD (gastroesophageal reflux disease)    reflux intermittent  . Hyperlipidemia   . 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 ADENOIDECTOMY    . CARPAL TUNNEL RELEASE     rt hand  . CATARACT EXTRACTION, BILATERAL    . CERVICAL FUSION  1998  . CYSTOSCOPY  07/26/2012   Procedure: CYSTOSCOPY;  Surgeon: Ailene Rud, MD;  Location: [facility];  Service: Urology;  Laterality: N/A;  DIAGNOSTIC CYSTO PROSTATE ULTRASOUND      . FEMUR IM NAIL Right 02/10/2019   Procedure: INTRAMEDULLARY (IM) NAIL FEMORAL;  Surgeon: Hiram Gash, MD;  Location: [facility];  Service: Orthopedics;  Laterality: Right;  . PROSTATE BIOPSY  07/26/2012   Procedure: PROSTATE BIOPSY;  Surgeon: Ailene Rud, MD;  Location: [facility];  Service: Urology;  Laterality: N/A;  . retinal micro aneurysms    . tonsil    . VASECTOMY    . VITRECTOMY      Social History:  reports that he has never smoked. He has never used smokeless tobacco. He reports that he does not drink alcohol or use drugs.  Allergies:  Allergies  Allergen Reactions  . Bee Venom Other (See Comments)    Bump where stung and took days to go away  . Glucophage [Metformin] Diarrhea    "I will never take it again - stool incontinent"  . Shellfish Allergy Swelling  . Grass Extracts [Gramineae Pollens] Other (See Comments)    Unknown rxn  . Tobacco [Tobacco] Other (See Comments)    Unknown rxn    Family history:  Family History  Problem Relation Age of Onset  . Diabetes Father   . Diabetes Sister   . Diabetes Brother      Home Meds: Prior to Admission medications   Medication Sig Start Date End Date Taking? Authorizing Provider  acetaminophen (TYLENOL) 500 MG tablet Take 500 mg by mouth every 4 (four) hours as needed for mild pain, fever or headache.   Yes [provider]  aspirin 81 MG chewable tablet Chew 81 mg by mouth daily.   Yes [provider]  carbamazepine (TEGRETOL)  200 MG tablet Take 300 mg by mouth 2 (two) times daily.    Yes [provider]  celecoxib (CELEBREX) 200 MG capsule Take 200 mg by mouth 2 (two) times daily.   Yes [provider]  Chloroxylenol-Zinc Oxide (BAZA EX) Apply 1 application topically as needed (incontinence episode). Buttocks and scrotum   Yes [provider]  Cholecalciferol (VITAMIN D3) 1.25 MG (50000 UT) CAPS Take 1.25 mg by mouth once a week. Mondays   Yes [provider]  donepezil (ARICEPT) 10 MG tablet Take 1 tablet (10 mg total) by mouth at bedtime. 05/03/18  Yes Cameron Sprang, MD  EPINEPHrine [facility]) 0.15 MG/0.3ML injection Inject 0.15 mg into the muscle as needed for anaphylaxis.   Yes [provider]  Ergocalciferol (VITAMIN D2 PO) Take 1 capsule by mouth daily.   Yes [provider]  ergocalciferol (VITAMIN D2) 1.25 MG (50000 UT) capsule Take 50,000 Units by mouth once a week. Mondays   Yes [provider]  guaiFENesin (ROBITUSSIN) 100 MG/5ML liquid Take 200 mg by mouth every 6 (six) hours as needed for cough.   Yes [provider]  insulin lispro (HUMALOG) 100 UNIT/ML KwikPen Inject 6 Units into the skin 3 (  three) times daily with meals. 10/11/18  Yes [provider]  LEVEMIR FLEXTOUCH 100 UNIT/ML Pen Inject 24 Units into the skin at bedtime. 02/16/19  Yes Eugenie Filler, MD  Lidocaine HCl (ASPERCREME W/LIDOCAINE) 4 % CREA Apply 1 application topically 2 (two) times daily. On back   Yes [provider]  loperamide (IMODIUM) 2 MG capsule Take 2 mg by mouth See admin instructions. Take 2 mg by mouth with each loose stool as needed for diarrhea (do not exceed 8 doses in 24 hours)   Yes [provider]  lovastatin (MEVACOR) 20 MG tablet Take 20 mg by mouth daily.   Yes [provider]  magnesium hydroxide (MILK OF MAGNESIA) 400 MG/5ML suspension Take 30 mLs by mouth at bedtime as needed for mild constipation.   Yes  [provider]  neomycin-bacitracin-polymyxin (NEOSPORIN) [id number] ointment Apply 1 application topically See admin instructions. *Standing order for skin tear, abrasions* Clean area with normal saline. Apply tao, cover with bandaid or gauze and tape. Change as needed until healed.   Yes [provider]  oxyCODONE (OXY IR/ROXICODONE) 5 MG immediate release tablet Take 0.5-1 pills every 4 hrs as needed for pain Patient taking differently: Take 2.5 mg by mouth every 4 (four) hours as needed for moderate pain.  02/11/19  Yes Hiram Gash, MD  risperiDONE (RISPERDAL) 1 MG tablet Take 1 mg by mouth daily as needed (agitation).  09/21/18  Yes [provider]  sertraline (ZOLOFT) 50 MG tablet Take 50 mg by mouth daily.   Yes [provider]  Suvorexant (BELSOMRA) 5 MG TABS Take 5 mg by mouth at bedtime.   Yes [provider]  tamsulosin (FLOMAX) 0.4 MG CAPS capsule Take 0.4 mg by mouth daily.    Yes [provider]  traZODone (DESYREL) 50 MG tablet Take 50 mg by mouth every 8 (eight) hours as needed (agitation).   Yes [provider]  enoxaparin (LOVENOX) 40 MG/0.4ML injection Inject 0.4 mLs (40 mg total) into the skin daily. Patient not taking: Reported on 05/13/2019 02/11/19 03/13/19  Hiram Gash, MD  LORazepam (ATIVAN) 1 MG tablet Take 1 tablet (1 mg total) by mouth every 8 (eight) hours as needed for anxiety (agitation). Patient not taking: Reported on 05/11/2019 02/16/19   Eugenie Filler, MD  nystatin cream (MYCOSTATIN) Apply to affected area 2 times daily Patient not taking: Reported on 01/13/2019 04/23/18   [name], PA-C  omeprazole (PRILOSEC) 20 MG capsule Take 1 capsule (20 mg total) by mouth daily. 05/11/19   Blanchie Dessert, MD  calcium citrate-vitamin D (CITRACAL+D) 315-200 MG-UNIT per tablet Take 1 tablet by mouth daily.  09/04/11  [provider]  terazosin (HYTRIN) 5 MG capsule Take 5 mg by mouth at bedtime.   09/04/11  [provider]    Physical Exam: Vitals:   05/13/19 1530 05/13/19 1600 05/13/19 1630 05/13/19 1700  BP:  (!) 158/69    Pulse: (!) 115 (!) 117 (!) 115 (!) 120  Resp: (!) 22 (!) 22 17 (!) 23  Temp:      TempSrc:      SpO2: 96% 94% 97% 93%   Wt Readings from Last 3 Encounters:  02/16/19 70.7 kg  01/13/19 68 kg  12/23/18 73.7 kg   There is no height or weight on file to calculate BMI.  General exam: Appears calm and comfortable.  Skin: No rashes, lesions or ulcers. HEENT: Atraumatic, normocephalic, supple neck, no obvious bleeding Lungs: Clear  to auscultate bilaterally CVS: Regular rate and rhythm, no murmur GI/Abd soft, nontender, nondistended, bowel sound present CNS: Alert, awake, not oriented to time place or person Psychiatry: Mood appropriate Extremities: No pedal edema, no calf tenderness  Labs on Admission:   CBC: Recent Labs  Lab 05/11/19 1203 05/13/19 1036 05/13/19 1519  WBC 9.3  --  10.7*  NEUTROABS 5.7  --  6.8  HGB 11.9* 13.9 13.3  HCT 36.0* 41.0 39.8  MCV 93.8  --  92.3  PLT 177  --  0000000    Basic Metabolic Panel: Recent Labs  Lab 05/11/19 1203 05/13/19 1036 05/13/19 1519  NA 136 141 141  K 3.4* 4.2 3.6  CL 105 104 105  CO2 21*  --  24  GLUCOSE 170* 211* 168*  BUN 28* 34* 25*  CREATININE 0.96 0.60* 0.67  CALCIUM 7.9*  --  8.9    Liver Function Tests: Recent Labs  Lab 05/11/19 1203 05/13/19 1519  AST 41 33  ALT 26 34  ALKPHOS 85 98  BILITOT 0.9 0.7  PROT 6.0* 6.6  ALBUMIN 3.5 3.4*   No results for input(s): LIPASE, AMYLASE in the last 168 hours. No results for input(s): AMMONIA in the last 168 hours.  Cardiac Enzymes: No results for input(s): CKTOTAL, CKMB, CKMBINDEX, TROPONINI in the last 168 hours.  BNP (last 3 results) No results for input(s): BNP in the last 8760 hours.  ProBNP (last 3 results) No results for input(s): PROBNP in the last 8760 hours.  CBG: No results for input(s): GLUCAP in the last  168 hours.  Lipase     Component Value Date/Time   LIPASE 13 09/04/2011 1507     Urinalysis    Component Value Date/Time   COLORURINE YELLOW 01/13/2019 1751   APPEARANCEUR CLEAR 01/13/2019 1751   LABSPEC 1.016 01/13/2019 1751   PHURINE 7.0 01/13/2019 1751   GLUCOSEU 150 (A) 01/13/2019 1751   HGBUR NEGATIVE 01/13/2019 1751   BILIRUBINUR NEGATIVE 01/13/2019 1751   KETONESUR NEGATIVE 01/13/2019 1751   PROTEINUR NEGATIVE 01/13/2019 1751   NITRITE NEGATIVE 01/13/2019 1751   LEUKOCYTESUR NEGATIVE 01/13/2019 1751     Drugs of Abuse     Component Value Date/Time   LABOPIA NONE DETECTED 06/05/2018 1151   COCAINSCRNUR NONE DETECTED 06/05/2018 1151   LABBENZ NONE DETECTED 06/05/2018 1151   AMPHETMU NONE DETECTED 06/05/2018 1151   THCU NONE DETECTED 06/05/2018 1151   LABBARB NONE DETECTED 06/05/2018 1151      Radiological Exams on Admission: Ct Head Wo Contrast  Result Date: 05/11/2019 CLINICAL DATA:  Patient was found down. EXAM: CT HEAD WITHOUT CONTRAST TECHNIQUE: Contiguous axial images were obtained from the base of the skull through the vertex without intravenous contrast. COMPARISON:  CT scan dated 02/10/2019 FINDINGS: Brain: There is no acute infarction or hemorrhage. Diffuse cerebral cortical atrophy with secondary slight ventricular dilatation, stable. Vascular: No hyperdense vessel or unexpected calcification. Skull: Normal. Negative for fracture or focal lesion. Sinuses/Orbits: Normal. Other: None IMPRESSION: No acute abnormalities. Diffuse atrophy. Electronically Signed   By: [name] M.D.   On: 05/11/2019 19:08   Dg Chest Port 1 View  Result Date: 05/13/2019 CLINICAL DATA:  COVID-19 positive. EXAM: PORTABLE CHEST 1 VIEW COMPARISON:  May 11, 2019 FINDINGS: The heart size and mediastinal contours are within normal limits. Previously noted patchy opacity of the right lung base is improved compared to prior exam. The left lung is clear. The visualized skeletal  structures are unremarkable. IMPRESSION: The previously noted  patchy opacity of right lung base is improved with mild residual opacity identified on current exam. No new infiltrates are noted. Electronically Signed   By: Abelardo Diesel M.D.   On: 05/13/2019 10:29   ----------------------------------------------------------------------------------------------------------------------------------------------------------- Severity of Illness: The appropriate patient status for this patient is OBSERVATION. Observation status is judged to be reasonable and necessary in order to provide the required intensity of service to ensure the patient's safety. The patient's presenting symptoms, physical exam findings, and initial radiographic and laboratory data in the context of their medical condition is felt to place them at decreased risk for further clinical deterioration. Furthermore, it is anticipated that the patient will be medically stable for discharge from the hospital within 2 midnights of admission. The following factors support the patient status of observation.   " The patient's presenting symptoms include Covid positive. " The physical exam findings include tachycardia, increased respiration. " The initial radiographic and laboratory data are slightly elevated WBC count.    Signed, Terrilee Croak, MD [facility] 05/13/2019

## 2019-05-13 NOTE — Progress Notes (Addendum)
Pharmacy Antibiotic Note  Hayden [name] is a 78 y.o. male presented to the ED from [place] on 05/13/2019 due to positive COVID test.  He had n/v in the ED and was suspected to aspirate while lying flat.  Pharmacy has been consulted to dose unasyn for aspiration PNA.  - scr 0.67 - weight 70 kg (documented on 02/16/19)  Plan: - unasyn 3gm IV q6h  ________________________________________  Temp (24hrs), Avg:100.7 F (38.2 C), Min:98.1 F (36.7 C), Max:103.3 F (39.6 C)  Recent Labs  Lab 05/11/19 1203 05/13/19 1036 05/13/19 1519  WBC 9.3  --  10.7*  CREATININE 0.96 0.60* 0.67  LATICACIDVEN  --   --  1.9    CrCl cannot be calculated (Unknown ideal weight.).    Allergies  Allergen Reactions  . Bee Venom Other (See Comments)    Bump where stung and took days to go away  . Glucophage [Metformin] Diarrhea    "I will never take it again - stool incontinent"  . Shellfish Allergy Swelling  . Grass Extracts [Gramineae Pollens] Other (See Comments)    Unknown rxn  . Tobacco [Tobacco] Other (See Comments)    Unknown rxn     Thank you for allowing pharmacy to be a part of this patient's care.  Lynelle Doctor 05/13/2019 6:17 PM

## 2019-05-13 NOTE — ED Provider Notes (Signed)
  Provider Note MRN:  [id number]  Arrival date & time: 05/13/19    ED Course and Medical Decision Making  Assumed care from Dr. [name] at shift change.  Worsening COVID-19 symptoms, poor p.o. intake, aspiration event here, febrile, will need admission.  5:15 PM update: Patient provided with Unasyn given concern for aspiration and/or sepsis.  Accepted for admission by hospitalist service.  Patient's positive Covid test was performed by the [facility] division and proof of this documentation is present in the epic media tab.  .Critical Care Performed by: Maudie Flakes, MD Authorized by: Maudie Flakes, MD   Critical care provider statement:    Critical care time (minutes):  32   Critical care was necessary to treat or prevent imminent or life-threatening deterioration of the following conditions: Concern for sepsis.   Critical care was time spent personally by me on the following activities:  Discussions with consultants, evaluation of patient's response to treatment, examination of patient, ordering and performing treatments and interventions, ordering and review of laboratory studies, ordering and review of radiographic studies, pulse oximetry, re-evaluation of patient's condition, obtaining history from patient or surrogate and review of old charts   I assumed direction of critical care for this patient from another provider in my specialty: yes      Final Clinical Impressions(s) / ED Diagnoses     ICD-10-CM   1. COVID-19  U07.1   2. Aspiration pneumonitis [facility])  J69.0     ED Discharge Orders    None      Discharge Instructions   None     Barth Kirks. [place] Small, [place] [email or web address]   Maudie Flakes, MD 05/13/19 [phone]

## 2019-05-13 NOTE — ED Notes (Signed)
[facility] asked for night shift nurse to call them with an update if there are status changes before [facility] arrives since there is a long wait time.

## 2019-05-13 NOTE — ED Notes (Addendum)
While RN was attempting blood work on patient, patient projectile vomited brown emesis. RN immediately suctioned patient's mouth out but possible aspiration noted. Patient is sitting upright in bed and RN put patient on 8L [place] because patient desat to 90%. MD notified, MD told RN to put patient on 6L , patient is now 100% O2. Pt has emesis bag in hand.

## 2019-05-14 ENCOUNTER — Inpatient Hospital Stay (HOSPITAL_COMMUNITY): Payer: Medicare Other

## 2019-05-14 ENCOUNTER — Observation Stay (HOSPITAL_COMMUNITY): Payer: Medicare Other

## 2019-05-14 ENCOUNTER — Encounter (HOSPITAL_COMMUNITY): Payer: Self-pay

## 2019-05-14 ENCOUNTER — Other Ambulatory Visit: Payer: Self-pay

## 2019-05-14 DIAGNOSIS — J1289 Other viral pneumonia: Secondary | ICD-10-CM

## 2019-05-14 DIAGNOSIS — I371 Nonrheumatic pulmonary valve insufficiency: Secondary | ICD-10-CM

## 2019-05-14 DIAGNOSIS — C9110 Chronic lymphocytic leukemia of B-cell type not having achieved remission: Secondary | ICD-10-CM | POA: Diagnosis present

## 2019-05-14 DIAGNOSIS — J9601 Acute respiratory failure with hypoxia: Secondary | ICD-10-CM | POA: Diagnosis present

## 2019-05-14 DIAGNOSIS — E785 Hyperlipidemia, unspecified: Secondary | ICD-10-CM | POA: Diagnosis present

## 2019-05-14 DIAGNOSIS — R000 Tachycardia, unspecified: Secondary | ICD-10-CM | POA: Diagnosis present

## 2019-05-14 DIAGNOSIS — U071 COVID-19: Secondary | ICD-10-CM

## 2019-05-14 DIAGNOSIS — I10 Essential (primary) hypertension: Secondary | ICD-10-CM | POA: Diagnosis present

## 2019-05-14 DIAGNOSIS — I82441 Acute embolism and thrombosis of right tibial vein: Secondary | ICD-10-CM | POA: Diagnosis present

## 2019-05-14 DIAGNOSIS — Z9981 Dependence on supplemental oxygen: Secondary | ICD-10-CM | POA: Diagnosis not present

## 2019-05-14 DIAGNOSIS — E876 Hypokalemia: Secondary | ICD-10-CM | POA: Diagnosis present

## 2019-05-14 DIAGNOSIS — I2699 Other pulmonary embolism without acute cor pulmonale: Secondary | ICD-10-CM | POA: Diagnosis present

## 2019-05-14 DIAGNOSIS — F0390 Unspecified dementia without behavioral disturbance: Secondary | ICD-10-CM

## 2019-05-14 DIAGNOSIS — J690 Pneumonitis due to inhalation of food and vomit: Secondary | ICD-10-CM | POA: Diagnosis present

## 2019-05-14 DIAGNOSIS — R1110 Vomiting, unspecified: Secondary | ICD-10-CM | POA: Diagnosis present

## 2019-05-14 DIAGNOSIS — Z66 Do not resuscitate: Secondary | ICD-10-CM | POA: Diagnosis present

## 2019-05-14 DIAGNOSIS — K219 Gastro-esophageal reflux disease without esophagitis: Secondary | ICD-10-CM | POA: Diagnosis present

## 2019-05-14 DIAGNOSIS — Z79899 Other long term (current) drug therapy: Secondary | ICD-10-CM | POA: Diagnosis not present

## 2019-05-14 DIAGNOSIS — D649 Anemia, unspecified: Secondary | ICD-10-CM | POA: Diagnosis present

## 2019-05-14 DIAGNOSIS — F329 Major depressive disorder, single episode, unspecified: Secondary | ICD-10-CM | POA: Diagnosis present

## 2019-05-14 DIAGNOSIS — Z7982 Long term (current) use of aspirin: Secondary | ICD-10-CM | POA: Diagnosis not present

## 2019-05-14 DIAGNOSIS — K922 Gastrointestinal hemorrhage, unspecified: Secondary | ICD-10-CM | POA: Diagnosis present

## 2019-05-14 DIAGNOSIS — N400 Enlarged prostate without lower urinary tract symptoms: Secondary | ICD-10-CM | POA: Diagnosis present

## 2019-05-14 DIAGNOSIS — E119 Type 2 diabetes mellitus without complications: Secondary | ICD-10-CM | POA: Diagnosis present

## 2019-05-14 DIAGNOSIS — J1282 Pneumonia due to coronavirus disease 2019: Secondary | ICD-10-CM

## 2019-05-14 DIAGNOSIS — Z794 Long term (current) use of insulin: Secondary | ICD-10-CM | POA: Diagnosis not present

## 2019-05-14 LAB — GLUCOSE, CAPILLARY
Glucose-Capillary: 107 mg/dL — ABNORMAL HIGH (ref 70–99)
Glucose-Capillary: 134 mg/dL — ABNORMAL HIGH (ref 70–99)
Glucose-Capillary: 137 mg/dL — ABNORMAL HIGH (ref 70–99)
Glucose-Capillary: 208 mg/dL — ABNORMAL HIGH (ref 70–99)
Glucose-Capillary: 280 mg/dL — ABNORMAL HIGH (ref 70–99)
Glucose-Capillary: 341 mg/dL — ABNORMAL HIGH (ref 70–99)

## 2019-05-14 LAB — ECHOCARDIOGRAM COMPLETE
Height: 65.984 in
Weight: 2222.24 oz

## 2019-05-14 LAB — CBC
HCT: 36.2 % — ABNORMAL LOW (ref 39.0–52.0)
Hemoglobin: 12 g/dL — ABNORMAL LOW (ref 13.0–17.0)
MCH: 30.6 pg (ref 26.0–34.0)
MCHC: 33.1 g/dL (ref 30.0–36.0)
MCV: 92.3 fL (ref 80.0–100.0)
Platelets: 184 10*3/uL (ref 150–400)
RBC: 3.92 MIL/uL — ABNORMAL LOW (ref 4.22–5.81)
RDW: 12.6 % (ref 11.5–15.5)
WBC: 9.5 10*3/uL (ref 4.0–10.5)
nRBC: 0 % (ref 0.0–0.2)

## 2019-05-14 LAB — BASIC METABOLIC PANEL
Anion gap: 10 (ref 5–15)
BUN: 28 mg/dL — ABNORMAL HIGH (ref 8–23)
CO2: 25 mmol/L (ref 22–32)
Calcium: 8.4 mg/dL — ABNORMAL LOW (ref 8.9–10.3)
Chloride: 108 mmol/L (ref 98–111)
Creatinine, Ser: 0.63 mg/dL (ref 0.61–1.24)
GFR calc Af Amer: >60 mL/min (ref >60)
GFR calc non Af Amer: >60 mL/min (ref >60)
Glucose, Bld: 138 mg/dL — ABNORMAL HIGH (ref 70–99)
Potassium: 3.1 mmol/L — ABNORMAL LOW (ref 3.5–5.1)
Sodium: 143 mmol/L (ref 135–145)

## 2019-05-14 LAB — HEPARIN LEVEL (UNFRACTIONATED): Heparin Unfractionated: 0.18 IU/mL — ABNORMAL LOW (ref 0.30–0.70)

## 2019-05-14 LAB — HEMOGLOBIN A1C
Hgb A1c MFr Bld: 6.9 % — ABNORMAL HIGH (ref 4.8–5.6)
Mean Plasma Glucose: 151.33 mg/dL

## 2019-05-14 LAB — MRSA PCR SCREENING: MRSA by PCR: NEGATIVE

## 2019-05-14 MED ORDER — TRAZODONE HCL 50 MG PO TABS
50.0000 mg | ORAL_TABLET | Freq: Three times a day (TID) | ORAL | Status: DC | PRN
  Administered 2019-05-16 – 2019-05-19 (×2): 50 mg via ORAL
  Filled 2019-05-14 (×2): qty 1

## 2019-05-14 MED ORDER — HEPARIN BOLUS VIA INFUSION
1500.0000 [IU] | Freq: Once | INTRAVENOUS | Status: AC
  Administered 2019-05-14: 13:00:00 1500 [IU] via INTRAVENOUS
  Filled 2019-05-14: qty 1500

## 2019-05-14 MED ORDER — SUVOREXANT 5 MG PO TABS: 5.0000 mg | ORAL_TABLET | Freq: Every day | ORAL | Status: DC

## 2019-05-14 MED ORDER — IOHEXOL 350 MG/ML SOLN
100.0000 mL | Freq: Once | INTRAVENOUS | Status: AC | PRN
  Administered 2019-05-14: 100 mL via INTRAVENOUS

## 2019-05-14 MED ORDER — SERTRALINE HCL 50 MG PO TABS
50.0000 mg | ORAL_TABLET | Freq: Every day | ORAL | Status: DC
  Administered 2019-05-14 – 2019-05-19 (×6): 50 mg via ORAL
  Filled 2019-05-14 (×6): qty 1

## 2019-05-14 MED ORDER — CELECOXIB 200 MG PO CAPS
200.0000 mg | ORAL_CAPSULE | Freq: Two times a day (BID) | ORAL | Status: DC
  Administered 2019-05-14 – 2019-05-17 (×8): 200 mg via ORAL
  Filled 2019-05-14 (×10): qty 1

## 2019-05-14 MED ORDER — DONEPEZIL HCL 10 MG PO TABS
10.0000 mg | ORAL_TABLET | Freq: Every day | ORAL | Status: DC
  Administered 2019-05-14 – 2019-05-18 (×5): 10 mg via ORAL
  Filled 2019-05-14 (×5): qty 1

## 2019-05-14 MED ORDER — INSULIN ASPART 100 UNIT/ML ~~LOC~~ SOLN: 0.0000 [IU] | Freq: Three times a day (TID) | SUBCUTANEOUS | Status: DC

## 2019-05-14 MED ORDER — CARBAMAZEPINE 200 MG PO TABS
300.0000 mg | ORAL_TABLET | Freq: Two times a day (BID) | ORAL | Status: DC
  Administered 2019-05-14 – 2019-05-16 (×5): 300 mg via ORAL
  Filled 2019-05-14 (×8): qty 1.5

## 2019-05-14 MED ORDER — HEPARIN (PORCINE) 25000 UT/250ML-% IV SOLN
1250.0000 [IU]/h | INTRAVENOUS | Status: DC
  Administered 2019-05-14: 1050 [IU]/h via INTRAVENOUS
  Administered 2019-05-14: 1250 [IU]/h via INTRAVENOUS
  Filled 2019-05-14 (×2): qty 250

## 2019-05-14 MED ORDER — HYDRALAZINE HCL 20 MG/ML IJ SOLN: 5.0000 mg | INTRAMUSCULAR | Status: DC | PRN

## 2019-05-14 MED ORDER — PANTOPRAZOLE SODIUM 40 MG PO TBEC
40.0000 mg | DELAYED_RELEASE_TABLET | Freq: Every day | ORAL | Status: DC
  Administered 2019-05-14 – 2019-05-19 (×6): 40 mg via ORAL
  Filled 2019-05-14 (×6): qty 1

## 2019-05-14 MED ORDER — HEPARIN BOLUS VIA INFUSION
3000.0000 [IU] | Freq: Once | INTRAVENOUS | Status: AC
  Administered 2019-05-14: 02:00:00 3000 [IU] via INTRAVENOUS
  Filled 2019-05-14: qty 3000

## 2019-05-14 MED ORDER — OXYCODONE HCL 5 MG PO TABS
2.5000 mg | ORAL_TABLET | ORAL | Status: DC | PRN
  Administered 2019-05-16 – 2019-05-18 (×3): 2.5 mg via ORAL
  Filled 2019-05-14 (×3): qty 1

## 2019-05-14 MED ORDER — SODIUM CHLORIDE 0.9 % IV SOLN
200.0000 mg | Freq: Once | INTRAVENOUS | Status: AC
  Administered 2019-05-14: 200 mg via INTRAVENOUS
  Filled 2019-05-14: qty 40

## 2019-05-14 MED ORDER — SODIUM CHLORIDE 0.9 % IV SOLN
100.0000 mg | INTRAVENOUS | Status: AC
  Administered 2019-05-15 – 2019-05-18 (×4): 100 mg via INTRAVENOUS
  Filled 2019-05-14: qty 100
  Filled 2019-05-14: qty 20
  Filled 2019-05-14 (×2): qty 100

## 2019-05-14 MED ORDER — INSULIN ASPART 100 UNIT/ML ~~LOC~~ SOLN
0.0000 [IU] | SUBCUTANEOUS | Status: DC
  Administered 2019-05-14: 17:00:00 3 [IU] via SUBCUTANEOUS
  Administered 2019-05-14: 22:00:00 7 [IU] via SUBCUTANEOUS
  Administered 2019-05-14: 1 [IU] via SUBCUTANEOUS
  Administered 2019-05-14: 5 [IU] via SUBCUTANEOUS
  Administered 2019-05-15: 9 [IU] via SUBCUTANEOUS
  Administered 2019-05-15: 7 [IU] via SUBCUTANEOUS
  Administered 2019-05-15: 3 [IU] via SUBCUTANEOUS
  Administered 2019-05-15: 1 [IU] via SUBCUTANEOUS
  Administered 2019-05-15 – 2019-05-16 (×2): 5 [IU] via SUBCUTANEOUS
  Administered 2019-05-16: 3 [IU] via SUBCUTANEOUS
  Administered 2019-05-16: 2 [IU] via SUBCUTANEOUS
  Administered 2019-05-16: 1 [IU] via SUBCUTANEOUS
  Administered 2019-05-16: 3 [IU] via SUBCUTANEOUS
  Administered 2019-05-16: 2 [IU] via SUBCUTANEOUS
  Administered 2019-05-17: 7 [IU] via SUBCUTANEOUS
  Administered 2019-05-17: 3 [IU] via SUBCUTANEOUS
  Administered 2019-05-17 (×2): 2 [IU] via SUBCUTANEOUS
  Administered 2019-05-17 (×2): 5 [IU] via SUBCUTANEOUS
  Administered 2019-05-17: 1 [IU] via SUBCUTANEOUS
  Administered 2019-05-18: 2 [IU] via SUBCUTANEOUS
  Administered 2019-05-18: 3 [IU] via SUBCUTANEOUS

## 2019-05-14 MED ORDER — DEXAMETHASONE SODIUM PHOSPHATE 10 MG/ML IJ SOLN
6.0000 mg | Freq: Two times a day (BID) | INTRAMUSCULAR | Status: DC
  Administered 2019-05-14 – 2019-05-17 (×7): 6 mg via INTRAVENOUS
  Filled 2019-05-14 (×7): qty 1

## 2019-05-14 MED ORDER — POTASSIUM CHLORIDE 10 MEQ/100ML IV SOLN
10.0000 meq | INTRAVENOUS | Status: AC
  Administered 2019-05-14 (×4): 10 meq via INTRAVENOUS
  Filled 2019-05-14 (×5): qty 100

## 2019-05-14 MED ORDER — RESOURCE THICKENUP CLEAR PO POWD
ORAL | Status: DC | PRN
  Filled 2019-05-14: qty 125

## 2019-05-14 MED ORDER — ASPIRIN 81 MG PO CHEW
81.0000 mg | CHEWABLE_TABLET | Freq: Every day | ORAL | Status: DC
  Administered 2019-05-14 – 2019-05-17 (×4): 81 mg via ORAL
  Filled 2019-05-14 (×4): qty 1

## 2019-05-14 MED ORDER — INSULIN DETEMIR 100 UNIT/ML ~~LOC~~ SOLN
15.0000 [IU] | Freq: Every day | SUBCUTANEOUS | Status: DC
  Administered 2019-05-14 (×2): 15 [IU] via SUBCUTANEOUS
  Filled 2019-05-14 (×3): qty 0.15

## 2019-05-14 MED ORDER — INSULIN ASPART 100 UNIT/ML ~~LOC~~ SOLN: 0.0000 [IU] | Freq: Every day | SUBCUTANEOUS | Status: DC

## 2019-05-14 MED ORDER — SODIUM CHLORIDE 0.9 % IV SOLN: 3.0000 g | Freq: Three times a day (TID) | INTRAVENOUS | Status: DC

## 2019-05-14 MED ORDER — ZOLPIDEM TARTRATE 5 MG PO TABS: 5.0000 mg | ORAL_TABLET | Freq: Every evening | ORAL | Status: DC | PRN

## 2019-05-14 MED ORDER — TAMSULOSIN HCL 0.4 MG PO CAPS
0.4000 mg | ORAL_CAPSULE | Freq: Every day | ORAL | Status: DC
  Administered 2019-05-14 – 2019-05-19 (×6): 0.4 mg via ORAL
  Filled 2019-05-14 (×6): qty 1

## 2019-05-14 MED ORDER — RISPERIDONE 1 MG PO TABS
1.0000 mg | ORAL_TABLET | Freq: Every day | ORAL | Status: DC | PRN
  Filled 2019-05-14: qty 1

## 2019-05-14 NOTE — Plan of Care (Addendum)
Heparin drip started for PE. Awaiting Echo and ultrasound of lower extremities. Awaiting swallowing eval today in lieu of aspiration. Talked to the sister regarding code status as pt previously DNR, she said she will discuss it with his brother who is main power of attorney. I also updated her of pt's present condition and current treatments. All her questions were answered.  Problem: Education: Goal: Knowledge of risk factors and measures for prevention of condition will improve 05/14/2019 0553 by Val Eagle, RN Outcome: Progressing 05/14/2019 0553 by Val Eagle, RN Outcome: Not Progressing   Problem: Coping: Goal: Psychosocial and spiritual needs will be supported 05/14/2019 0553 by Val Eagle, RN Outcome: Progressing 05/14/2019 0553 by Val Eagle, RN Outcome: Not Progressing   Problem: Respiratory: Goal: Will maintain a patent airway 05/14/2019 0553 by Val Eagle, RN Outcome: Progressing 05/14/2019 0553 by Val Eagle, RN Outcome: Progressing Goal: Complications related to the disease process, condition or treatment will be avoided or minimized 05/14/2019 0553 by Val Eagle, RN Outcome: Progressing 05/14/2019 0553 by Val Eagle, RN Outcome: Progressing

## 2019-05-14 NOTE — Progress Notes (Signed)
Spoke with pts sister [name].  Update on pt status.  Pt ate some dinner this evening and has been alert all day.  All questions answered.

## 2019-05-14 NOTE — Progress Notes (Signed)
Per vascular patient has positive DVT in right calf

## 2019-05-14 NOTE — Progress Notes (Addendum)
PROGRESS NOTE  Hayden [name] [id number] DOB: [birth year] DOA: 05/13/2019  PCP: Patient, No Pcp Per  Brief History/Interval Summary: 78 year old Caucasian male who is a resident of [facility] who has a history of dementia, leukemia, diabetes mellitus, hyperlipidemia GERD and fibromyalgia was reportedly diagnosed with COVID-19 about 2 days prior to this admission.  He did not have any symptoms.  For unclear reason patient was sent over to the emergency department.  Evaluation in the emergency department did reveal a elevated CRP of 25.7 lactic acid of 1.9 procalcitonin less than 0.1.  Chest x-ray showed patchy density in the right lung base suspicious for pneumonia.  This was from 11/11.  Chest x-ray from day of admission showed improvement in this patchy opacity.  While he was in the emergency department he had projectile vomiting of brown emesis.  He aspirated.  He spiked a fever.  He became hypoxic.  He was stabilized and then transferred to this hospital for further evaluation and management.  Reason for Visit: Aspiration pneumonia.  Pneumonia due to COVID-19.  Pulmonary embolism  Consultants: None  Procedures: None  Antibiotics: Anti-infectives (From admission, onward)   Start     Dose/Rate Route Frequency Ordered Stop   05/14/19 0011  Ampicillin-Sulbactam (UNASYN) 3 g in sodium chloride 0.9 % 100 mL IVPB  Status:  Discontinued     3 g 200 mL/hr over 30 Minutes Intravenous Every 8 hours 05/14/19 0011 05/14/19 0018   05/13/19 2200  Ampicillin-Sulbactam (UNASYN) 3 g in sodium chloride 0.9 % 100 mL IVPB     3 g 200 mL/hr over 30 Minutes Intravenous Every 6 hours 05/13/19 1827     05/13/19 1530  Ampicillin-Sulbactam (UNASYN) 3 g in sodium chloride 0.9 % 100 mL IVPB     3 g 200 mL/hr over 30 Minutes Intravenous  Once 05/13/19 1529 05/13/19 1823      Subjective/Interval History: Patient not a very good historian.  Not very communicative.  Denies any chest pain.  Seems to be  sitting comfortably on the bed.  Seems anxious.    Assessment/Plan:  Acute Hypoxic Resp. Failure/Pneumonia due to COVID-19/aspiration pneumonia  COVID-19 Labs  Recent Labs    05/13/19 1519  DDIMER >20.00*  FERRITIN 311  LDH 298*  CRP 25.7*    Lab Results  Component Value Date   SARSCOV2NAA NEGATIVE 02/15/2019   [place] NEGATIVE 02/10/2019     Fever: Fever of 103.3 yesterday in the emergency department. Oxygen requirements: Currently saturating normal on room air. Antibacterials: Unasyn Remdesivir: Will initiate today Steroids: Will initiate today Diuretics: Not on diuretics on a scheduled basis Actemra: Now that he is thought to have aspiration pneumonia cannot be given Actemra Vitamin C and Zinc: Will initiate DVT Prophylaxis: Heparin infusion for PE  Patient's respiratory status appears to have stabilized.  His symptoms thought to be multifactorial including aspiration pneumonia, pneumonia due to COVID-19 as well as acute pulmonary embolism.  Patient was started on Unasyn which will be continued.  His CRP is noted to be significantly elevated.  I think there is reason to start him on steroids and remdesivir as well.  We will do this.  Trend inflammatory markers.  No Actemra due to concern for aspiration pneumonia.  I think at this point in time we can hold off on convalescent plasma as well.  D-dimer was noted to be significantly elevated at greater than 20.  Incentive spirometry.  Mobilization.  Acute pulmonary embolism D-Dimer was noted to be greater than  20.  CT angiogram shows bilateral pulmonary embolism.  No evidence for saddle emboli.  No right heart strain.  Continue with IV heparin for now.  History of dementia Continue home medications.  He is noted to be on Aricept.  Also noted to be on carbamazepine.  Noted to be on Risperdal and lorazepam at home.  Diabetes mellitus type 2 Anticipate some worsening in his glucose levels with initiation of steroids.   Continue with SSI.  HbA1c 6.9.  Continue with Levemir as well.  Elevated blood pressure Unclear if he has a history of essential hypertension or not.  Home medication list does not show any blood pressure lowering agents.  Hydralazine as needed for now.  May need long-acting drugs as well.  Hypokalemia Will be repleted.  Check magnesium.  History of BPH Continue with Flomax.  ADDENDUM Discussed with sister. Change to DNR.  DVT Prophylaxis: On heparin infusion PUD Prophylaxis: PPI Code Status: DNR Family Communication: We will discuss with family members Disposition Plan: Continue inpatient care for now.  Will likely need to go back to skilled nursing facility when ready for discharge.   Medications:  Scheduled: . aspirin  81 mg Oral Daily  . carbamazepine  300 mg Oral BID  . celecoxib  200 mg Oral BID  . donepezil  10 mg Oral QHS  . insulin aspart  0-9 Units Subcutaneous Q4H  . insulin detemir  15 Units Subcutaneous QHS  . pantoprazole  40 mg Oral Daily  . senna  1 tablet Oral BID  . sertraline  50 mg Oral Daily  . sodium chloride flush  3 mL Intravenous Q12H  . tamsulosin  0.4 mg Oral Daily   Continuous: . ampicillin-sulbactam (UNASYN) IV 3 g (05/14/19 0414)  . heparin 1,050 Units/hr (05/14/19 0207)  . potassium chloride 10 mEq (05/14/19 0826)   [id number] **OR** acetaminophen, hydrALAZINE, magnesium hydroxide, ondansetron **OR** ondansetron (ZOFRAN) IV, oxyCODONE, risperiDONE, sodium phosphate, sorbitol, traZODone, zolpidem   Objective:  Vital Signs  Vitals:   05/14/19 0333 05/14/19 0400 05/14/19 0600 05/14/19 0800  BP: (!) 171/81 (!) 128/58 134/68 (!) 170/70  Pulse: 81 73 81 86  Resp: 19 17 18    Temp: 98.8 F (37.1 C)   98.4 F (36.9 C)  TempSrc: Oral   Oral  SpO2: 96% 93% 97% 97%  Weight:      Height:        Intake/Output Summary (Last 24 hours) at 05/14/2019 0944 Last data filed at 05/14/2019 0600 Gross per 24 hour  Intake 459.19 ml  Output  350 ml  Net 109.19 ml   Filed Weights   05/14/19 0026  Weight: 63 kg    General appearance: Awake alert.  In no distress.  Distracted Resp: Mildly tachypneic at rest.  Coarse breath sound bilaterally.  Crackles at the bases bilaterally.  No wheezing or rhonchi.   Cardio: S1-S2 is normal regular.  No S3-S4.  No rubs murmurs or bruit GI: Abdomen is soft.  Nontender nondistended.  Bowel sounds are present normal.  No masses organomegaly Extremities: No edema.  Noted to have deconditioning as he is not able to raise his legs fully Neurologic: Disoriented.  No focal neurological deficits.   Lab Results:  Data Reviewed: I have personally reviewed following labs and imaging studies  CBC: Recent Labs  Lab 05/11/19 1203 05/13/19 1036 05/13/19 1519 05/14/19 0326  WBC 9.3  --  10.7* 9.5  NEUTROABS 5.7  --  6.8  --   HGB 11.9* 13.9  13.3 12.0*  HCT 36.0* 41.0 39.8 36.2*  MCV 93.8  --  92.3 92.3  PLT 177  --  195 [id number]    Basic Metabolic Panel: Recent Labs  Lab 05/11/19 1203 05/13/19 1036 05/13/19 1519 05/14/19 0326  NA 136 141 141 143  K 3.4* 4.2 3.6 3.1*  CL 105 104 105 108  CO2 21*  --  24 25  GLUCOSE 170* 211* 168* 138*  BUN 28* 34* 25* 28*  CREATININE 0.96 0.60* 0.67 0.63  CALCIUM 7.9*  --  8.9 8.4*    GFR: Estimated Creatinine Clearance: 66.7 mL/min (by C-G formula based on SCr of 0.63 mg/dL).  Liver Function Tests: Recent Labs  Lab 05/11/19 1203 05/13/19 1519  AST 41 33  ALT 26 34  ALKPHOS 85 98  BILITOT 0.9 0.7  PROT 6.0* 6.6  ALBUMIN 3.5 3.4*     Coagulation Profile: Recent Labs  Lab 05/11/19 1203  INR 1.2    HbA1C: Recent Labs    05/14/19 0326  HGBA1C 6.9*    CBG: Recent Labs  Lab 05/14/19 0019 05/14/19 0337 05/14/19 0742  GLUCAP 137* 134* 107*    Lipid Profile: Recent Labs    05/13/19 1519  TRIG 115    Anemia Panel: Recent Labs    05/13/19 1519  FERRITIN 311    Recent Results (from the past 240 hour(s))  MRSA PCR  Screening     Status: None   Collection Time: 05/14/19  2:41 AM   Specimen: Nasal Mucosa; Nasopharyngeal  Result Value Ref Range Status   MRSA by PCR NEGATIVE NEGATIVE Final    Comment:        The GeneXpert MRSA Assay (FDA approved for NASAL specimens only), is one component of a comprehensive MRSA colonization surveillance program. It is not intended to diagnose MRSA infection nor to guide or monitor treatment for MRSA infections. Performed at [facility], [place] [address] [place], [place] [address]       Radiology Studies: Ct Angio Chest Pe W Or Wo Contrast  Addendum Date: 05/14/2019   ADDENDUM REPORT: 05/14/2019 02:55 ADDENDUM: The musculoskeletal section should read degenerative changes of the thoracic spine are seen. Additionally there is a mildly displaced fracture of the left eighth rib posteriorly. Old rib fractures are noted on the left as well. Electronically Signed   By: Inez Catalina M.D.   On: 05/14/2019 02:55   Result Date: 05/14/2019 CLINICAL DATA:  Difficulty breathing and positive D-dimer, history of COVID-19 positivity EXAM: CT ANGIOGRAPHY CHEST WITH CONTRAST TECHNIQUE: Multidetector CT imaging of the chest was performed using the standard protocol during bolus administration of intravenous contrast. Multiplanar CT image reconstructions and MIPs were obtained to evaluate the vascular anatomy. CONTRAST:  147mL OMNIPAQUE IOHEXOL 350 MG/ML SOLN COMPARISON:  Chest x-ray from the previous day. FINDINGS: Cardiovascular: Thoracic aorta demonstrates a normal branching pattern. No aneurysmal dilatation or dissection is seen. Mild atherosclerotic changes are noted. No cardiac enlargement is noted. The pulmonary artery shows a normal branching pattern with filling defects within the lower lobes bilaterally left slightly greater than right consistent with pulmonary emboli. No evidence of right heart strain is noted. Mediastinum/Nodes: Thoracic inlet is within normal  limits. No hilar or mediastinal adenopathy is noted. The esophagus as visualized is within normal. Lungs/Pleura: Lungs are well aerated bilaterally. Patchy lower lobe infiltrates are seen bilaterally left right slightly greater than left. No sizable effusion is noted. Upper Abdomen: Visualized upper abdomen is within normal limits. Musculoskeletal: Degenerative  changes of the thoracic spine are seen. No acute rib abnormality is noted. Review of the MIP images confirms the above findings. IMPRESSION: Changes consistent with bilateral pulmonary emboli without evidence of right heart strain. Bibasilar infiltrates consistent with the given clinical history of COVID-19 positivity and likely accentuated bilateral lower lobe pulmonary emboli. Aortic Atherosclerosis (ICD10-I70.0). These results will be called to the ordering clinician or representative by the Radiologist Assistant, and communication documented in the PACS or zVision Dashboard. Electronically Signed: By: Inez Catalina M.D. On: 05/14/2019 01:40   Dg Chest Port 1 View  Result Date: 05/14/2019 CLINICAL DATA:  COVID positive. Shortness of breath. EXAM: PORTABLE CHEST 1 VIEW COMPARISON:  None. FINDINGS: Normal heart size. No pleural effusion identified. Bilateral lower lobe hazy lung opacities are identified corresponding to the CT abnormality from earlier today. The visualized osseous structures are unremarkable. IMPRESSION: Mild bilateral lower lobe hazy lung opacities compatible with the clinical history of COVID-19 positivity. Electronically Signed   By: Kerby Moors M.D.   On: 05/14/2019 06:21   Dg Chest Port 1 View  Result Date: 05/13/2019 CLINICAL DATA:  COVID-19 positive. EXAM: PORTABLE CHEST 1 VIEW COMPARISON:  May 11, 2019 FINDINGS: The heart size and mediastinal contours are within normal limits. Previously noted patchy opacity of the right lung base is improved compared to prior exam. The left lung is clear. The visualized skeletal  structures are unremarkable. IMPRESSION: The previously noted patchy opacity of right lung base is improved with mild residual opacity identified on current exam. No new infiltrates are noted. Electronically Signed   By: Abelardo Diesel M.D.   On: 05/13/2019 10:29       LOS: 1 day   [facility] Pager on [email or web address]  05/14/2019, 9:44 AM

## 2019-05-14 NOTE — Progress Notes (Signed)
DNR bracelet placed on patient 

## 2019-05-14 NOTE — Progress Notes (Addendum)
CTA completed, official read is still pending, but the patient very obviously does have large central PE on my review of the images at the scanner.  Ordering heparin gtt per pharm consult to start.  Patient now satting 98% on RA currently.  Addendum: When RN tried to give patient a sip of water, patient started coughing.  Given this, the ? Of AMS (vs baseline dementia), as well as the documented aspiration event earlier in the day:  1) Will make patient NPO, and order SLP eval for diet recs in AM. 2) Looks like hes already on maint IVF. 3) Will switch SSI to Q4H.  Regarding the PE: 1) heparin gtt as above 2) 2d echo 3) venous duplex BLE 4) tele monitor

## 2019-05-14 NOTE — Progress Notes (Signed)
  Echocardiogram 2D Echocardiogram has been performed.  Hayden [name] 05/14/2019, 11:13 AM

## 2019-05-14 NOTE — Progress Notes (Signed)
Called by RN: BP 171/81.  Will DC IVF and put on hydralazine PRN in case it gets over [id number] systolic or [id number] diastolic.

## 2019-05-14 NOTE — Progress Notes (Signed)
ANTICOAGULATION CONSULT NOTE - [facility] for Heparin Indication: pulmonary embolus  Allergies  Allergen Reactions  . Bee Venom Other (See Comments)    Bump where stung and took days to go away  . Glucophage [Metformin] Diarrhea    "I will never take it again - stool incontinent"  . Shellfish Allergy Swelling  . Grass Extracts [Gramineae Pollens] Other (See Comments)    Unknown rxn  . Tobacco [Tobacco] Other (See Comments)    Unknown rxn    Patient Measurements: Height: 5' 5.98" (167.6 cm) Weight: 138 lb 14.2 oz (63 kg) IBW/kg (Calculated) : 63.76  Vital Signs: Temp: 98.4 F (36.9 C) (11/14 0800) Temp Source: Oral (11/14 0800) BP: 174/83 (11/14 1200) Pulse Rate: 93 (11/14 1200)  Labs: Recent Labs    05/13/19 1036 05/13/19 1519 05/14/19 0326 05/14/19 1030  HGB 13.9 13.3 12.0*  --   HCT 41.0 39.8 36.2*  --   PLT  --  195 184  --   HEPARINUNFRC  --   --   --  0.18*  CREATININE 0.60* 0.67 0.63  --     Estimated Creatinine Clearance: 66.7 mL/min (by C-G formula based on SCr of 0.63 mg/dL).   Medical History: Past Medical History:  Diagnosis Date  . Candidiasis   . Depression   . Diabetes mellitus   . Enlarged prostate   . Fibromyalgia   . GERD (gastroesophageal reflux disease)    reflux intermittent  . Hyperlipidemia   . 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 aspirin  81 mg Oral Daily  . carbamazepine  300 mg Oral BID  . celecoxib  200 mg Oral BID  . dexamethasone (DECADRON) injection  6 mg Intravenous Q12H  . donepezil  10 mg Oral QHS  . insulin aspart  0-9 Units Subcutaneous Q4H  . insulin detemir  15 Units Subcutaneous QHS  . pantoprazole  40 mg Oral Daily  . senna  1 tablet Oral BID  . sertraline  50 mg Oral Daily  . sodium chloride flush  3 mL Intravenous Q12H  . tamsulosin   0.4 mg Oral Daily    Assessment: 78yoM admitted with COVID-19 found to have acute PE. No anticoagulation noted PTA, D-dimer >20, CBC wnl. Pharmacy consulted for IV heparin. Initial heparin level subtherapeutic at 0.18. No S/Sx bleeding or infusion issues.  Goal of Therapy:  Heparin level 0.3-0.7 units/ml Monitor platelets by anticoagulation protocol: Yes   Plan:  -Heparin 1500 units x1 -Increase infusion to 1250 units/hr -Recheck heparin level in [place], PharmD, BCPS Clinical Pharmacist Please check AMION for all [facility] numbers 05/14/2019

## 2019-05-14 NOTE — Progress Notes (Signed)
Bilateral lower extremity venous duplex completed. Refer to "CV Proc" under chart review to view preliminary results.  Critical results discussed with Rod Holler, RN.  05/14/2019 2:37 PM Kelby Aline., MHA, RVT, RDCS, RDMS

## 2019-05-14 NOTE — Evaluation (Signed)
Clinical/Bedside Swallow Evaluation Patient Details  Name: Hayden [name] MRN: [id number] Date of Birth: 11-03-1940  Today's Date: 05/14/2019 Time: SLP Start Time (ACUTE ONLY): 0945 SLP Stop Time (ACUTE ONLY): 1000 SLP Time Calculation (min) (ACUTE ONLY): 15 min  Past Medical History:  Past Medical History:  Diagnosis Date  . Candidiasis   . Depression   . Diabetes mellitus   . Enlarged prostate   . Fibromyalgia   . GERD (gastroesophageal reflux disease)    reflux intermittent  . Hyperlipidemia   . 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 ADENOIDECTOMY    . CARPAL TUNNEL RELEASE     rt hand  . CATARACT EXTRACTION, BILATERAL    . CERVICAL FUSION  1998  . CYSTOSCOPY  07/26/2012   Procedure: CYSTOSCOPY;  Surgeon: Hayden Rud, MD;  Location: [facility];  Service: Urology;  Laterality: N/A;  DIAGNOSTIC CYSTO PROSTATE ULTRASOUND      . FEMUR IM NAIL Right 02/10/2019   Procedure: INTRAMEDULLARY (IM) NAIL FEMORAL;  Surgeon: Hayden Gash, MD;  Location: [facility];  Service: Orthopedics;  Laterality: Right;  . PROSTATE BIOPSY  07/26/2012   Procedure: PROSTATE BIOPSY;  Surgeon: Hayden Rud, MD;  Location: [facility];  Service: Urology;  Laterality: N/A;  . retinal micro aneurysms    . tonsil    . VASECTOMY    . VITRECTOMY     HPI:  Patient is a 78 y.o. male a resident of [facility] with history of dementia, leukemia, diabetes mellitus, hyperlipidemia, GERD, fibromyalgia. Patient was reportedly diagnosed positive for Covid 2 days ago at the facility.  But he did not have any symptoms.  For some unknown reason, the supervisor at the facility called EMS and sent the patient to the ED. While in the ED, patient had projectile vomiting of brown  emesis.  RN immediately suctioned patient's mouth but possible aspiration was noted.  He is spiked a fever of 103.3, became tachycardic up to 120 and required up to 8 L of oxygen by nasal cannula. CXR shows Mild bilateral lower lobe hazy lung opacities compatible with the clinical history of COVID-19 positivity. Pt then found to have had a large PE on CTA and put on heparin. Later, pt was given a sip of water by RN and had some coughing. Pt made NPO.    Assessment / Plan / Recommendation Clinical Impression  Pt demonstraes no observable cause subjectively for dysphagia, but consistently coughs with ice and thin liquids regardless of rate or bolus size. No coughing elicited with nectar thick liquids or puree. Pt also able to masticate saltine, but dry mucosa made it effroful and prolonged. Recommend pt initiate a modified diet of finely chopped solids and nectar thick liquids. Will follow for tolerance and if needed better diagnostic assessment of dysphagia if if persists. Discussed with RN.  SLP Visit Diagnosis: Dysphagia, oropharyngeal phase (R13.12)    Aspiration Risk  Mild aspiration risk    Diet Recommendation Dysphagia 2 (Fine chop);Nectar-thick liquid   Liquid Administration via: Cup;Straw Medication Administration: Whole meds with puree Supervision: Patient able to self feed Compensations: Slow rate;Small sips/bites Postural Changes: Seated upright at 90 degrees    Other  Recommendations Oral Care Recommendations: Oral care BID Other Recommendations: Order thickener from pharmacy  Follow up Recommendations Skilled Nursing facility      Frequency and Duration min 2x/week  2 weeks       Prognosis Prognosis for Safe Diet Advancement: Good      Swallow Study   General HPI: Patient is a 78 y.o. male a resident of [facility] with history of dementia, leukemia, diabetes mellitus, hyperlipidemia, GERD, fibromyalgia. Patient was reportedly diagnosed positive for Covid 2 days ago  at the facility.  But he did not have any symptoms.  For some unknown reason, the supervisor at the facility called EMS and sent the patient to the ED. While in the ED, patient had projectile vomiting of brown emesis.  RN immediately suctioned patient's mouth but possible aspiration was noted.  He is spiked a fever of 103.3, became tachycardic up to 120 and required up to 8 L of oxygen by nasal cannula. CXR shows Mild bilateral lower lobe hazy lung opacities compatible with the clinical history of COVID-19 positivity. Pt then found to have had a large PE on CTA and put on heparin. Later, pt was given a sip of water by RN and had some coughing. Pt made NPO.  Type of Study: Bedside Swallow Evaluation Previous Swallow Assessment: none Diet Prior to this Study: NPO Temperature Spikes Noted: No Respiratory Status: Nasal cannula History of Recent Intubation: No Behavior/Cognition: Alert;Cooperative;Pleasant mood Oral Cavity Assessment: Within Functional Limits Oral Care Completed by SLP: No Oral Cavity - Dentition: Adequate natural dentition Vision: Functional for self-feeding Self-Feeding Abilities: Able to feed self;Needs assist Patient Positioning: Upright in bed Baseline Vocal Quality: Normal Volitional Cough: Strong Volitional Swallow: Able to elicit    Oral/Motor/Sensory Function Overall Oral Motor/Sensory Function: Within functional limits   Ice Chips Ice chips: Impaired Pharyngeal Phase Impairments: Cough - Immediate   Thin Liquid Thin Liquid: Impaired Pharyngeal  Phase Impairments: Cough - Immediate    Nectar Thick Nectar Thick Liquid: Within functional limits Presentation: Cup;Straw   Honey Thick Honey Thick Liquid: Not tested   Puree Puree: Within functional limits Presentation: Spoon   Solid     Solid: Impaired Presentation: Self Fed Oral Phase Impairments: Impaired mastication Oral Phase Functional Implications: Impaired mastication     Hayden [place], MA CCC-SLP  Acute  Rehabilitation Services Pager [phone] Office [phone]  Hayden [name] 05/14/2019,11:21 AM

## 2019-05-14 NOTE — Progress Notes (Signed)
ANTICOAGULATION CONSULT NOTE - Initial Consult  Pharmacy Consult for Heparin Indication: pulmonary embolus  Allergies  Allergen Reactions  . Bee Venom Other (See Comments)    Bump where stung and took days to go away  . Glucophage [Metformin] Diarrhea    "I will never take it again - stool incontinent"  . Shellfish Allergy Swelling  . Grass Extracts [Gramineae Pollens] Other (See Comments)    Unknown rxn  . Tobacco [Tobacco] Other (See Comments)    Unknown rxn    Patient Measurements: Height: 5' 5.98" (167.6 cm) Weight: 138 lb 14.2 oz (63 kg) IBW/kg (Calculated) : 63.76  Vital Signs: Temp: 100.2 F (37.9 C) (11/14 0026) Temp Source: Oral (11/14 0026) BP: 148/73 (11/14 0026) Pulse Rate: 78 (11/14 0026)  Labs: Recent Labs    05/11/19 1203 05/13/19 1036 05/13/19 1519  HGB 11.9* 13.9 13.3  HCT 36.0* 41.0 39.8  PLT 177  --  195  APTT 27  --   --   LABPROT 14.7  --   --   INR 1.2  --   --   CREATININE 0.96 0.60* 0.67    Estimated Creatinine Clearance: 66.7 mL/min (by C-G formula based on SCr of 0.67 mg/dL).   Medical History: Past Medical History:  Diagnosis Date  . Candidiasis   . Depression   . Diabetes mellitus   . Enlarged prostate   . Fibromyalgia   . GERD (gastroesophageal reflux disease)    reflux intermittent  . Hyperlipidemia   . 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 aspirin  81 mg Oral Daily  . carbamazepine  300 mg Oral BID  . celecoxib  200 mg Oral BID  . donepezil  10 mg Oral QHS  . enoxaparin (LOVENOX) injection  40 mg Subcutaneous Q12H  . insulin aspart  0-5 Units Subcutaneous QHS  . insulin aspart  0-9 Units Subcutaneous TID WC  . insulin detemir  15 Units Subcutaneous QHS  . pantoprazole  40 mg Oral Daily  . senna  1 tablet Oral BID  . sertraline  50 mg Oral Daily  . sodium chloride  flush  3 mL Intravenous Q12H  . Suvorexant  5 mg Oral QHS  . tamsulosin  0.4 mg Oral Daily    Assessment: 78 y.o. M presents with COVID 19. Pt to begin heparin gtt for PE. Per MD note "CTA completed, official read is still pending, but the patient very obviously does have large central PE on my review of the images at the scanner."  Pt with no AC PTA. Received Lovenox 40mg  SQ ~0020. D-dimer >20. H/H, plt wnl.  Goal of Therapy:  Heparin level 0.3-0.7 units/ml Monitor platelets by anticoagulation protocol: Yes   Plan:  D/c Lovenox Heparin IV bolus 3000 units (~3/4 normal bolus with Lovenox just given) Heparin gtt at 1050 units/hr Will f/u heparin level in 8 hours Daily heparin level and CBC  Sherlon Handing, PharmD, BCPS [facility] Clinical pharmacist phone [phone] 05/14/2019,1:44 AM

## 2019-05-15 LAB — CBC
HCT: 35.6 % — ABNORMAL LOW (ref 39.0–52.0)
Hemoglobin: 11.9 g/dL — ABNORMAL LOW (ref 13.0–17.0)
MCH: 30.7 pg (ref 26.0–34.0)
MCHC: 33.4 g/dL (ref 30.0–36.0)
MCV: 92 fL (ref 80.0–100.0)
Platelets: 205 10*3/uL (ref 150–400)
RBC: 3.87 MIL/uL — ABNORMAL LOW (ref 4.22–5.81)
RDW: 12.5 % (ref 11.5–15.5)
WBC: 10.1 10*3/uL (ref 4.0–10.5)
nRBC: 0 % (ref 0.0–0.2)

## 2019-05-15 LAB — COMPREHENSIVE METABOLIC PANEL
ALT: 57 U/L — ABNORMAL HIGH (ref 0–44)
AST: 71 U/L — ABNORMAL HIGH (ref 15–41)
Albumin: 2.8 g/dL — ABNORMAL LOW (ref 3.5–5.0)
Alkaline Phosphatase: 86 U/L (ref 38–126)
Anion gap: 9 (ref 5–15)
BUN: 25 mg/dL — ABNORMAL HIGH (ref 8–23)
CO2: 24 mmol/L (ref 22–32)
Calcium: 8.3 mg/dL — ABNORMAL LOW (ref 8.9–10.3)
Chloride: 110 mmol/L (ref 98–111)
Creatinine, Ser: 0.61 mg/dL (ref 0.61–1.24)
GFR calc Af Amer: >60 mL/min (ref >60)
GFR calc non Af Amer: >60 mL/min (ref >60)
Glucose, Bld: 287 mg/dL — ABNORMAL HIGH (ref 70–99)
Potassium: 4 mmol/L (ref 3.5–5.1)
Sodium: 143 mmol/L (ref 135–145)
Total Bilirubin: 0.7 mg/dL (ref 0.3–1.2)
Total Protein: 5.7 g/dL — ABNORMAL LOW (ref 6.5–8.1)

## 2019-05-15 LAB — GLUCOSE, CAPILLARY
Glucose-Capillary: 128 mg/dL — ABNORMAL HIGH (ref 70–99)
Glucose-Capillary: 67 mg/dL — ABNORMAL LOW (ref 70–99)
Glucose-Capillary: 106 mg/dL — ABNORMAL HIGH (ref 70–99)
Glucose-Capillary: 294 mg/dL — ABNORMAL HIGH (ref 70–99)
Glucose-Capillary: 360 mg/dL — ABNORMAL HIGH (ref 70–99)
Glucose-Capillary: 248 mg/dL — ABNORMAL HIGH (ref 70–99)

## 2019-05-15 LAB — MAGNESIUM: Magnesium: 1.9 mg/dL (ref 1.7–2.4)

## 2019-05-15 LAB — HEPARIN LEVEL (UNFRACTIONATED)
Heparin Unfractionated: 0.51 IU/mL (ref 0.30–0.70)
Heparin Unfractionated: 0.58 IU/mL (ref 0.30–0.70)

## 2019-05-15 LAB — C-REACTIVE PROTEIN: CRP: 20 mg/dL — ABNORMAL HIGH (ref <1.0)

## 2019-05-15 MED ORDER — ENOXAPARIN SODIUM 100 MG/ML ~~LOC~~ SOLN
1.5000 mg/kg | SUBCUTANEOUS | Status: AC
  Administered 2019-05-15 – 2019-05-17 (×3): 95 mg via SUBCUTANEOUS
  Filled 2019-05-15 (×4): qty 1

## 2019-05-15 MED ORDER — AMLODIPINE BESYLATE 10 MG PO TABS
10.0000 mg | ORAL_TABLET | Freq: Every day | ORAL | Status: DC
  Administered 2019-05-15 – 2019-05-19 (×5): 10 mg via ORAL
  Filled 2019-05-15 (×5): qty 1

## 2019-05-15 MED ORDER — INSULIN DETEMIR 100 UNIT/ML ~~LOC~~ SOLN
8.0000 [IU] | Freq: Every day | SUBCUTANEOUS | Status: DC
  Administered 2019-05-15 – 2019-05-18 (×4): 8 [IU] via SUBCUTANEOUS
  Filled 2019-05-15 (×4): qty 0.08

## 2019-05-15 NOTE — Plan of Care (Signed)
Discussed with patient plan of care for the evening, pain management and encouraging to drink ensure with some teach back displayed.  Also updated family.

## 2019-05-15 NOTE — Progress Notes (Addendum)
PROGRESS NOTE  Hayden [name] [id number] DOB: 1941/03/30 DOA: 05/13/2019  PCP: Patient, No Pcp Per  Brief History/Interval Summary: 78 year old Caucasian male who is a resident of [place] who has a history of dementia, leukemia, diabetes mellitus, hyperlipidemia GERD and fibromyalgia was reportedly diagnosed with COVID-19 about 2 days prior to this admission.  He did not have any symptoms.  For unclear reason patient was sent over to the emergency department.  Evaluation in the emergency department did reveal a elevated CRP of 25.7 lactic acid of 1.9 procalcitonin less than 0.1.  Chest x-ray showed patchy density in the right lung base suspicious for pneumonia.  This was from 11/11.  Chest x-ray from day of admission showed improvement in this patchy opacity.  While he was in the emergency department he had projectile vomiting of brown emesis.  He aspirated.  He spiked a fever.  He became hypoxic.  He was stabilized and then transferred to this hospital for further evaluation and management.  Reason for Visit: Aspiration pneumonia.  Pneumonia due to COVID-19.  Pulmonary embolism  Consultants: None  Procedures:   Transthoracic echocardiogram 11/14    1. Left ventricular ejection fraction, by visual estimation, is 55 to 60%, although assessment is extremelty limited due to poor visualization. Would recommend contrast enhancement in the future.  2. Global right ventricle was not well visualized.The right ventricular size is not well visualized. Right vetricular wall thickness was not assessed.  3. Left atrial size was not well visualized.  4. Right atrial size was not well visualized.  5. Mild mitral annular calcification.  6. The mitral valve was not well visualized. unable to visualize mitral valve regurgitation.  7. The tricuspid valve is not well visualized. Tricuspid valve regurgitation unable to visualize.  8. Aortic valve regurgitation unable to visualize.  9. The aortic  valve was not well visualized. Aortic valve regurgitation unable to visualize. unable to visualize. 10. The pulmonic valve was not well visualized. Pulmonic valve regurgitation is moderate. 11. The interatrial septum was not well visualized.  Antibiotics: Anti-infectives (From admission, onward)   Start     Dose/Rate Route Frequency Ordered Stop   05/15/19 1000  remdesivir 100 mg in sodium chloride 0.9 % 250 mL IVPB     100 mg 500 mL/hr over 30 Minutes Intravenous Every 24 hours 05/14/19 1002 05/19/19 0959   05/14/19 1100  remdesivir 200 mg in sodium chloride 0.9 % 250 mL IVPB     200 mg 500 mL/hr over 30 Minutes Intravenous Once 05/14/19 1002 05/14/19 1340   05/14/19 0011  Ampicillin-Sulbactam (UNASYN) 3 g in sodium chloride 0.9 % 100 mL IVPB  Status:  Discontinued     3 g 200 mL/hr over 30 Minutes Intravenous Every 8 hours 05/14/19 0011 05/14/19 0018   05/13/19 2200  Ampicillin-Sulbactam (UNASYN) 3 g in sodium chloride 0.9 % 100 mL IVPB     3 g 200 mL/hr over 30 Minutes Intravenous Every 6 hours 05/13/19 1827     05/13/19 1530  Ampicillin-Sulbactam (UNASYN) 3 g in sodium chloride 0.9 % 100 mL IVPB     3 g 200 mL/hr over 30 Minutes Intravenous  Once 05/13/19 1529 05/13/19 1823      Subjective/Interval History: Patient not a good historian.  Noted to be frowning this morning but he denies any pain.  No overnight events per nursing staff.    Assessment/Plan:  Acute Hypoxic Resp. Failure/Pneumonia due to COVID-19/aspiration pneumonia  COVID-19 Labs  Recent Labs  05/13/19 1519 05/15/19 0035  DDIMER >20.00*  --   FERRITIN 311  --   LDH 298*  --   CRP 25.7* 20.0*    Lab Results  Component Value Date   SARSCOV2NAA NEGATIVE 02/15/2019   Colp NEGATIVE 02/10/2019     Fever: Afebrile in the last 24 hours Oxygen requirements: Room air.  Saturating in the 90s. Antibacterials: Unasyn Remdesivir: Day 2 Steroids: Dexamethasone 6 mg every 12 hours Diuretics: Not on  diuretics on a scheduled basis Actemra: Now that he is thought to have aspiration pneumonia cannot be given Actemra Vitamin C and Zinc: Will initiate today DVT Prophylaxis: Heparin infusion for PE  Patient's respiratory status appears to have stabilized.  He has been weaned off of oxygen.  Respiratory failure thought to be multifactorial including COVID-19, aspiration as well as pulmonary embolism.  Continue Unasyn.  CRP was elevated at 25.7 on 11/13.  Improved to 20.0.  D-dimer was greater than 20.  Continue mobilization and incentive spirometry.  No Actemra due to concern for secondary bacterial infection.  Since he has improved can hold off on convalescent plasma as well.  Acute DVT noted in the right lower extremity on venous Doppler studies.  Echocardiogram report as above.  Normal EF.  No other concerning findings noted.  Acute pulmonary embolism D-Dimer was noted to be greater than 20.  CT angiogram shows bilateral pulmonary embolism.  No evidence for saddle emboli.  No right heart strain.  Patient is stable.  Transition to rivaroxaban.  History of dementia Continue home medications.  He is noted to be on Aricept.  Also noted to be on carbamazepine.  Noted to be on Risperdal and lorazepam at home.  Diabetes mellitus type 2 with episodes of hypoglycemia and hyperglycemia Elevated CBG due to steroids.  Did have low glucose level this morning.  We did decrease the dose of his insulin yesterday.  We will further cut back today.  HbA1c 6.9.  Continue SSI.    Elevated blood pressure Unclear if he has a history of essential hypertension or not.  Home medication list does not show any blood pressure lowering agents.  He has been on hydralazine as needed.  Some of his elevated blood pressure likely due to anxiety.  We will start him on definitive treatment.  Start amlodipine.    Hypokalemia Potassium level normal today.  Magnesium 1.9.  Normocytic anemia Stable hemoglobin.  Continue to monitor.   No evidence of overt blood loss.  History of BPH Continue with Flomax.   DVT Prophylaxis: On heparin infusion.  Change to rivaroxaban. PUD Prophylaxis: PPI Code Status: DNR Family Communication: Discussed with his sister yesterday. Disposition Plan: Continue current plan.  PT and OT evaluation.  Should be able to return to her skilled nursing facility when ready for discharge.   Medications:  Scheduled:  aspirin  81 mg Oral Daily   carbamazepine  300 mg Oral BID   celecoxib  200 mg Oral BID   dexamethasone (DECADRON) injection  6 mg Intravenous Q12H   donepezil  10 mg Oral QHS   insulin aspart  0-9 Units Subcutaneous Q4H   insulin detemir  15 Units Subcutaneous QHS   pantoprazole  40 mg Oral Daily   senna  1 tablet Oral BID   sertraline  50 mg Oral Daily   sodium chloride flush  3 mL Intravenous Q12H   tamsulosin  0.4 mg Oral Daily   Continuous:  ampicillin-sulbactam (UNASYN) IV 3 g (05/15/19 1037)  heparin 1,250 Units/hr (05/15/19 0900)   remdesivir 100 mg in NS 250 mL 100 mg (05/15/19 1038)   [id number] **OR** acetaminophen, hydrALAZINE, magnesium hydroxide, ondansetron **OR** ondansetron (ZOFRAN) IV, oxyCODONE, Resource ThickenUp Clear, risperiDONE, sodium phosphate, sorbitol, traZODone, zolpidem   Objective:  Vital Signs  Vitals:   05/15/19 0600 05/15/19 0617 05/15/19 0625 05/15/19 0800  BP: (!) 167/79 (!) 160/76  (!) 169/72  Pulse: 95 97 92 96  Resp: 18 (!) 22 18 (!) 21  Temp:    99.2 F (37.3 C)  TempSrc:    Oral  SpO2: 97% 97% 97% 96%  Weight:      Height:        Intake/Output Summary (Last 24 hours) at 05/15/2019 1119 Last data filed at 05/15/2019 1000 Gross per 24 hour  Intake 3287.65 ml  Output 1780 ml  Net 1507.65 ml   Filed Weights   05/14/19 0026  Weight: 63 kg    General appearance: Awake alert.  In no distress.  Distracted.  Seems to be anxious. Resp: Mildly tachypneic at rest.  Coarse breath sound bilaterally  with crackles at the bases.  No wheezing or rhonchi. Cardio: S1-S2 is normal regular.  No S3-S4.  No rubs murmurs or bruit GI: Abdomen is soft.  Nontender nondistended.  Bowel sounds are present normal.  No masses organomegaly Extremities: No edema.  Moving all extremities.  Some deconditioning noted. Neurologic: Disoriented.  No obvious focal neurological deficits.    Lab Results:  Data Reviewed: I have personally reviewed following labs and imaging studies  CBC: Recent Labs  Lab 05/11/19 1203 05/13/19 1036 05/13/19 1519 05/14/19 0326 05/15/19 0035  WBC 9.3  --  10.7* 9.5 10.1  NEUTROABS 5.7  --  6.8  --   --   HGB 11.9* 13.9 13.3 12.0* 11.9*  HCT 36.0* 41.0 39.8 36.2* 35.6*  MCV 93.8  --  92.3 92.3 92.0  PLT 177  --  195 184 [id number]    Basic Metabolic Panel: Recent Labs  Lab 05/11/19 1203 05/13/19 1036 05/13/19 1519 05/14/19 0326 05/15/19 0035  NA 136 141 141 143 143  K 3.4* 4.2 3.6 3.1* 4.0  CL 105 104 105 108 110  CO2 21*  --  24 25 24   GLUCOSE 170* 211* 168* 138* 287*  BUN 28* 34* 25* 28* 25*  CREATININE 0.96 0.60* 0.67 0.63 0.61  CALCIUM 7.9*  --  8.9 8.4* 8.3*  MG  --   --   --   --  1.9    GFR: Estimated Creatinine Clearance: 66.7 mL/min (by C-G formula based on SCr of 0.61 mg/dL).  Liver Function Tests: Recent Labs  Lab 05/11/19 1203 05/13/19 1519 05/15/19 0035  AST 41 33 71*  ALT 26 34 57*  ALKPHOS 85 98 86  BILITOT 0.9 0.7 0.7  PROT 6.0* 6.6 5.7*  ALBUMIN 3.5 3.4* 2.8*     Coagulation Profile: Recent Labs  Lab 05/11/19 1203  INR 1.2    HbA1C: Recent Labs    05/14/19 0326  HGBA1C 6.9*    CBG: Recent Labs  Lab 05/14/19 1621 05/14/19 2200 05/15/19 0433 05/15/19 0710 05/15/19 0745  GLUCAP 208* 341* 128* 67* 106*    Lipid Profile: Recent Labs    05/13/19 1519  TRIG 115    Anemia Panel: Recent Labs    05/13/19 1519  FERRITIN 311    Recent Results (from the past 240 hour(s))  Blood Culture (routine x 2)      Status: None (  Preliminary result)   Collection Time: 05/13/19  3:19 PM   Specimen: BLOOD LEFT ARM  Result Value Ref Range Status   Specimen Description   Final    BLOOD LEFT ARM Performed at [facility], [address], [place], [place] [address]    Special Requests   Final    BOTTLES DRAWN AEROBIC ONLY Blood Culture adequate volume Performed at [facility], [address], [place], [place] [address]    Culture   Final    NO GROWTH < 24 HOURS Performed at [facility] [address] [place], [place] [address]    Report Status PENDING  Incomplete  Blood Culture (routine x 2)     Status: None (Preliminary result)   Collection Time: 05/13/19  3:19 PM   Specimen: BLOOD RIGHT HAND  Result Value Ref Range Status   Specimen Description   Final    BLOOD RIGHT HAND Performed at [facility], [place] [address] [place], [place] [address]    Special Requests   Final    BOTTLES DRAWN AEROBIC ONLY Blood Culture adequate volume Performed at [facility], [address] [place], [place] [address]    Culture   Final    NO GROWTH < 24 HOURS Performed at [facility] [address] [place],  [address]    Report Status PENDING  Incomplete  MRSA PCR Screening     Status: None   Collection Time: 05/14/19  2:41 AM   Specimen: Nasal Mucosa; Nasopharyngeal  Result Value Ref Range Status   MRSA by PCR NEGATIVE NEGATIVE Final    Comment:        The GeneXpert MRSA Assay (FDA approved for NASAL specimens only), is one component of a comprehensive MRSA colonization surveillance program. It is not intended to diagnose MRSA infection nor to guide or monitor treatment for MRSA infections. Performed at [facility], [place] [address] [place],  [address]       Radiology Studies: Ct Angio Chest Pe W Or Wo Contrast  Addendum Date: 05/14/2019     ADDENDUM REPORT: 05/14/2019 02:55 ADDENDUM: The musculoskeletal section should read degenerative changes of the thoracic spine are seen. Additionally there is a mildly displaced fracture of the left eighth rib posteriorly. Old rib fractures are noted on the left as well. Electronically Signed   By: Inez Catalina M.D.   On: 05/14/2019 02:55   Result Date: 05/14/2019 CLINICAL DATA:  Difficulty breathing and positive D-dimer, history of COVID-19 positivity EXAM: CT ANGIOGRAPHY CHEST WITH CONTRAST TECHNIQUE: Multidetector CT imaging of the chest was performed using the standard protocol during bolus administration of intravenous contrast. Multiplanar CT image reconstructions and MIPs were obtained to evaluate the vascular anatomy. CONTRAST:  140mL OMNIPAQUE IOHEXOL 350 MG/ML SOLN COMPARISON:  Chest x-ray from the previous day. FINDINGS: Cardiovascular: Thoracic aorta demonstrates a normal branching pattern. No aneurysmal dilatation or dissection is seen. Mild atherosclerotic changes are noted. No cardiac enlargement is noted. The pulmonary artery shows a normal branching pattern with filling defects within the lower lobes bilaterally left slightly greater than right consistent with pulmonary emboli. No evidence of right heart strain is noted. Mediastinum/Nodes: Thoracic inlet is within normal limits. No hilar or mediastinal adenopathy is noted. The esophagus as visualized is within normal. Lungs/Pleura: Lungs are well aerated bilaterally. Patchy lower lobe infiltrates are seen bilaterally left right slightly greater than left. No sizable effusion is  noted. Upper Abdomen: Visualized upper abdomen is within normal limits. Musculoskeletal: Degenerative changes of the thoracic spine are seen. No acute rib abnormality is noted. Review of the MIP images confirms the above findings. IMPRESSION: Changes consistent with bilateral pulmonary emboli without evidence of right heart strain. Bibasilar infiltrates consistent with  the given clinical history of COVID-19 positivity and likely accentuated bilateral lower lobe pulmonary emboli. Aortic Atherosclerosis (ICD10-I70.0). These results will be called to the ordering clinician or representative by the Radiologist Assistant, and communication documented in the PACS or zVision Dashboard. Electronically Signed: By: Inez Catalina M.D. On: 05/14/2019 01:40   Dg Chest Port 1 View  Result Date: 05/14/2019 CLINICAL DATA:  COVID positive. Shortness of breath. EXAM: PORTABLE CHEST 1 VIEW COMPARISON:  None. FINDINGS: Normal heart size. No pleural effusion identified. Bilateral lower lobe hazy lung opacities are identified corresponding to the CT abnormality from earlier today. The visualized osseous structures are unremarkable. IMPRESSION: Mild bilateral lower lobe hazy lung opacities compatible with the clinical history of COVID-19 positivity. Electronically Signed   By: Kerby Moors M.D.   On: 05/14/2019 06:21   Vas [place] Lower Extremity Venous (dvt)  Result Date: 05/15/2019  Lower Venous Study Indications: Elevated d-dimer.  Limitations: Body habitus, poor ultrasound/tissue interface and patient unable to cooperate, poor patient position, posterior acoustic shadowing secondary to arterial calcification obscuring view of vein. Comparison Study: No prior study Performing Technologist: Maudry Mayhew MHA, RDMS, RVT, RDCS  Examination Guidelines: A complete evaluation includes B-mode imaging, spectral Doppler, color Doppler, and power Doppler as needed of all accessible portions of each vessel. Bilateral testing is considered an integral part of a complete examination. Limited examinations for reoccurring indications may be performed as noted.  +---------+---------------+---------+-----------+----------+--------------+  RIGHT     Compressibility Phasicity Spontaneity Properties Thrombus Aging  +---------+---------------+---------+-----------+----------+--------------+  CFV       Full             No        Yes                                    +---------+---------------+---------+-----------+----------+--------------+  SFJ       Full                                                             +---------+---------------+---------+-----------+----------+--------------+  FV Prox   Full                                                             +---------+---------------+---------+-----------+----------+--------------+  FV Mid    Full                                                             +---------+---------------+---------+-----------+----------+--------------+  FV Distal Full                                                             +---------+---------------+---------+-----------+----------+--------------+  PFV       Full                                                             +---------+---------------+---------+-----------+----------+--------------+  POP                                                        Not visualized  +---------+---------------+---------+-----------+----------+--------------+  PTV       None                      No                     Acute           +---------+---------------+---------+-----------+----------+--------------+  PERO      Full                                                             +---------+---------------+---------+-----------+----------+--------------+  +---------+---------------+---------+-----------+----------+--------------+  LEFT      Compressibility Phasicity Spontaneity Properties Thrombus Aging  +---------+---------------+---------+-----------+----------+--------------+  CFV       Full            No        Yes                                    +---------+---------------+---------+-----------+----------+--------------+  SFJ       Full                                                             +---------+---------------+---------+-----------+----------+--------------+  FV Prox   Full                                                              +---------+---------------+---------+-----------+----------+--------------+  FV Mid    Full                                                             +---------+---------------+---------+-----------+----------+--------------+  FV Distal                 No        Yes                                    +---------+---------------+---------+-----------+----------+--------------+  POP       Full            Yes       Yes                                    +---------+---------------+---------+-----------+----------+--------------+  PTV       Full                                                             +---------+---------------+---------+-----------+----------+--------------+  PERO                                                       Not visualized  +---------+---------------+---------+-----------+----------+--------------+  Summary: Right: Findings consistent with acute deep vein thrombosis involving the right posterior tibial veins. Left: There is no evidence of deep vein thrombosis in the lower extremity. However, portions of this examination were limited- see technologist comments above. No cystic structure found in the popliteal fossa.  Pulsatile venous flow is suggestive of possibly elevated right heart pressure. *See table(s) above for measurements and observations. Electronically signed by Monica Martinez MD on 05/15/2019 at 10:39:59 AM.    Final        LOS: 2 days   [facility] Pager on [email or web address]  05/15/2019, 11:19 AM

## 2019-05-15 NOTE — Progress Notes (Addendum)
ANTICOAGULATION CONSULT NOTE - [place] for Heparin>Lovenox Indication: pulmonary embolus  Allergies  Allergen Reactions  . Bee Venom Other (See Comments)    Bump where stung and took days to go away  . Glucophage [Metformin] Diarrhea    "I will never take it again - stool incontinent"  . Shellfish Allergy Swelling  . Grass Extracts [Gramineae Pollens] Other (See Comments)    Unknown rxn  . Tobacco [Tobacco] Other (See Comments)    Unknown rxn    Patient Measurements: Height: 5' 5.98" (167.6 cm) Weight: 138 lb 14.2 oz (63 kg) IBW/kg (Calculated) : 63.76  Vital Signs: Temp: 99.2 F (37.3 C) (11/15 0800) Temp Source: Oral (11/15 0800) BP: 168/82 (11/15 1000) Pulse Rate: 107 (11/15 1000)  Labs: Recent Labs    05/13/19 1519 05/14/19 0326 05/14/19 1030 05/15/19 0035 05/15/19 0745  HGB 13.3 12.0*  --  11.9*  --   HCT 39.8 36.2*  --  35.6*  --   PLT 195 184  --  205  --   HEPARINUNFRC  --   --  0.18* 0.51 0.58  CREATININE 0.67 0.63  --  0.61  --     Estimated Creatinine Clearance: 66.7 mL/min (by C-G formula based on SCr of 0.61 mg/dL).   Medical History: Past Medical History:  Diagnosis Date  . Candidiasis   . Depression   . Diabetes mellitus   . Enlarged prostate   . Fibromyalgia   . GERD (gastroesophageal reflux disease)    reflux intermittent  . Hyperlipidemia   . 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 amLODipine  10 mg Oral Daily  . aspirin  81 mg Oral Daily  . carbamazepine  300 mg Oral BID  . celecoxib  200 mg Oral BID  . dexamethasone (DECADRON) injection  6 mg Intravenous Q12H  . donepezil  10 mg Oral QHS  . enoxaparin (LOVENOX) injection  1.5 mg/kg Subcutaneous Q24H  . insulin aspart  0-9 Units Subcutaneous Q4H  . insulin detemir  8 Units Subcutaneous QHS  . pantoprazole   40 mg Oral Daily  . senna  1 tablet Oral BID  . sertraline  50 mg Oral Daily  . sodium chloride flush  3 mL Intravenous Q12H  . tamsulosin  0.4 mg Oral Daily    Assessment: 78yoM admitted with COVID-19 found to have acute PE. No anticoagulation noted PTA, D-dimer >20, CBC wnl. Pharmacy consulted for IV heparin.  Heparin level therapeutic for 2 levels (0.51>>0.58) on gtt at 1250 units/hr. No bleeding noted.  Plan was to convert heparin to Xarelto but he is on carbamazepine which will interact with all of the NOACs. Choice will either be SQ Lovenox or Arixtra or close monitoring of Coumadin. Dr. [place] Pink has decided with Lovenox.   Goal of Therapy:  4-HR Anti-Xa 1-2 Monitor platelets by anticoagulation protocol: Yes   Plan:  Dc heparin Lovenox 95mg  SQ qday F/u CBC  Onnie Boer, PharmD, BCIDP, AAHIVP, CPP Infectious Disease Pharmacist 05/15/2019 1:21 PM

## 2019-05-15 NOTE — Progress Notes (Signed)
ANTICOAGULATION CONSULT NOTE - [place] for Heparin Indication: pulmonary embolus  Allergies  Allergen Reactions  . Bee Venom Other (See Comments)    Bump where stung and took days to go away  . Glucophage [Metformin] Diarrhea    "I will never take it again - stool incontinent"  . Shellfish Allergy Swelling  . Grass Extracts [Gramineae Pollens] Other (See Comments)    Unknown rxn  . Tobacco [Tobacco] Other (See Comments)    Unknown rxn    Patient Measurements: Height: 5' 5.98" (167.6 cm) Weight: 138 lb 14.2 oz (63 kg) IBW/kg (Calculated) : 63.76  Vital Signs: Temp: 98.2 F (36.8 C) (11/15 0000) Temp Source: Oral (11/15 0000) BP: 143/62 (11/15 0100) Pulse Rate: 91 (11/15 0100)  Labs: Recent Labs    05/13/19 1036 05/13/19 1519 05/14/19 0326 05/14/19 1030 05/15/19 0035  HGB 13.9 13.3 12.0*  --  11.9*  HCT 41.0 39.8 36.2*  --  35.6*  PLT  --  195 184  --  205  HEPARINUNFRC  --   --   --  0.18* 0.51  CREATININE 0.60* 0.67 0.63  --   --     Estimated Creatinine Clearance: 66.7 mL/min (by C-G formula based on SCr of 0.63 mg/dL).   Medical History: Past Medical History:  Diagnosis Date  . Candidiasis   . Depression   . Diabetes mellitus   . Enlarged prostate   . Fibromyalgia   . GERD (gastroesophageal reflux disease)    reflux intermittent  . Hyperlipidemia   . 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 aspirin  81 mg Oral Daily  . carbamazepine  300 mg Oral BID  . celecoxib  200 mg Oral BID  . dexamethasone (DECADRON) injection  6 mg Intravenous Q12H  . donepezil  10 mg Oral QHS  . insulin aspart  0-9 Units Subcutaneous Q4H  . insulin detemir  15 Units Subcutaneous QHS  . pantoprazole  40 mg Oral Daily  . senna  1 tablet Oral BID  . sertraline  50 mg Oral Daily  . sodium chloride  flush  3 mL Intravenous Q12H  . tamsulosin  0.4 mg Oral Daily    Assessment: 78yoM admitted with COVID-19 found to have acute PE. No anticoagulation noted PTA, D-dimer >20, CBC wnl. Pharmacy consulted for IV heparin.  Heparin level therapeutic (0.51) on gtt at 1250 units/hr. No bleeding noted.  Goal of Therapy:  Heparin level 0.3-0.7 units/ml Monitor platelets by anticoagulation protocol: Yes   Plan:  Continue infusion at 1250 units/hr F/u heparin level in 6 hrs to confirm  Sherlon Handing, PharmD, BCPS CGV Clinical pharmacist phone [phone] 05/15/2019

## 2019-05-15 NOTE — Plan of Care (Signed)
Discussed with patient plan of care for the evening, pain management and encouraged him to drink two ensures with some teach back displayed.  Unable to update his family due to give direct care.

## 2019-05-15 NOTE — Progress Notes (Signed)
ANTICOAGULATION CONSULT NOTE - [place] for Heparin Indication: pulmonary embolus  Allergies  Allergen Reactions  . Bee Venom Other (See Comments)    Bump where stung and took days to go away  . Glucophage [Metformin] Diarrhea    "I will never take it again - stool incontinent"  . Shellfish Allergy Swelling  . Grass Extracts [Gramineae Pollens] Other (See Comments)    Unknown rxn  . Tobacco [Tobacco] Other (See Comments)    Unknown rxn    Patient Measurements: Height: 5' 5.98" (167.6 cm) Weight: 138 lb 14.2 oz (63 kg) IBW/kg (Calculated) : 63.76  Vital Signs: Temp: 99.2 F (37.3 C) (11/15 0800) Temp Source: Oral (11/15 0800) BP: 169/72 (11/15 0800) Pulse Rate: 96 (11/15 0800)  Labs: Recent Labs    05/13/19 1519 05/14/19 0326 05/14/19 1030 05/15/19 0035 05/15/19 0745  HGB 13.3 12.0*  --  11.9*  --   HCT 39.8 36.2*  --  35.6*  --   PLT 195 184  --  205  --   HEPARINUNFRC  --   --  0.18* 0.51 0.58  CREATININE 0.67 0.63  --  0.61  --     Estimated Creatinine Clearance: 66.7 mL/min (by C-G formula based on SCr of 0.61 mg/dL).   Medical History: Past Medical History:  Diagnosis Date  . Candidiasis   . Depression   . Diabetes mellitus   . Enlarged prostate   . Fibromyalgia   . GERD (gastroesophageal reflux disease)    reflux intermittent  . Hyperlipidemia   . 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 aspirin  81 mg Oral Daily  . carbamazepine  300 mg Oral BID  . celecoxib  200 mg Oral BID  . dexamethasone (DECADRON) injection  6 mg Intravenous Q12H  . donepezil  10 mg Oral QHS  . insulin aspart  0-9 Units Subcutaneous Q4H  . insulin detemir  15 Units Subcutaneous QHS  . pantoprazole  40 mg Oral Daily  . senna  1 tablet Oral BID  . sertraline  50 mg Oral Daily  . sodium chloride flush   3 mL Intravenous Q12H  . tamsulosin  0.4 mg Oral Daily    Assessment: 78yoM admitted with COVID-19 found to have acute PE. No anticoagulation noted PTA, D-dimer >20, CBC wnl. Pharmacy consulted for IV heparin.  Heparin level therapeutic for 2 levels (0.51>>0.58) on gtt at 1250 units/hr. No bleeding noted.  Goal of Therapy:  Heparin level 0.3-0.7 units/ml Monitor platelets by anticoagulation protocol: Yes   Plan:  Continue infusion at 1250 units/hr Will continue to monitor daily heparin level and CBC while on therapy Follow-up for oral anticoagulation plan  Sloan Leiter, PharmD, BCPS, BCCCP Clinical Pharmacist Please refer to [facility] for [facility] Pharmacy numbers 05/15/2019

## 2019-05-16 LAB — GLUCOSE, CAPILLARY
Glucose-Capillary: 202 mg/dL — ABNORMAL HIGH (ref 70–99)
Glucose-Capillary: 197 mg/dL — ABNORMAL HIGH (ref 70–99)
Glucose-Capillary: 127 mg/dL — ABNORMAL HIGH (ref 70–99)
Glucose-Capillary: 223 mg/dL — ABNORMAL HIGH (ref 70–99)
Glucose-Capillary: 297 mg/dL — ABNORMAL HIGH (ref 70–99)
Glucose-Capillary: 196 mg/dL — ABNORMAL HIGH (ref 70–99)

## 2019-05-16 LAB — CBC
HCT: 36.4 % — ABNORMAL LOW (ref 39.0–52.0)
Hemoglobin: 12.4 g/dL — ABNORMAL LOW (ref 13.0–17.0)
MCH: 30.7 pg (ref 26.0–34.0)
MCHC: 34.1 g/dL (ref 30.0–36.0)
MCV: 90.1 fL (ref 80.0–100.0)
Platelets: 270 10*3/uL (ref 150–400)
RBC: 4.04 MIL/uL — ABNORMAL LOW (ref 4.22–5.81)
RDW: 12.3 % (ref 11.5–15.5)
WBC: 13.5 10*3/uL — ABNORMAL HIGH (ref 4.0–10.5)
nRBC: 0 % (ref 0.0–0.2)

## 2019-05-16 LAB — COMPREHENSIVE METABOLIC PANEL
ALT: 50 U/L — ABNORMAL HIGH (ref 0–44)
AST: 34 U/L (ref 15–41)
Albumin: 3.2 g/dL — ABNORMAL LOW (ref 3.5–5.0)
Alkaline Phosphatase: 93 U/L (ref 38–126)
Anion gap: 10 (ref 5–15)
BUN: 22 mg/dL (ref 8–23)
CO2: 25 mmol/L (ref 22–32)
Calcium: 8.6 mg/dL — ABNORMAL LOW (ref 8.9–10.3)
Chloride: 109 mmol/L (ref 98–111)
Creatinine, Ser: 0.69 mg/dL (ref 0.61–1.24)
GFR calc Af Amer: >60 mL/min (ref >60)
GFR calc non Af Amer: >60 mL/min (ref >60)
Glucose, Bld: 189 mg/dL — ABNORMAL HIGH (ref 70–99)
Potassium: 4 mmol/L (ref 3.5–5.1)
Sodium: 144 mmol/L (ref 135–145)
Total Bilirubin: 0.7 mg/dL (ref 0.3–1.2)
Total Protein: 6.4 g/dL — ABNORMAL LOW (ref 6.5–8.1)

## 2019-05-16 LAB — C-REACTIVE PROTEIN: CRP: 14.3 mg/dL — ABNORMAL HIGH (ref <1.0)

## 2019-05-16 MED ORDER — GLUCERNA SHAKE PO LIQD
237.0000 mL | Freq: Three times a day (TID) | ORAL | Status: DC
  Administered 2019-05-16 – 2019-05-19 (×10): 237 mL via ORAL
  Filled 2019-05-16 (×15): qty 237

## 2019-05-16 NOTE — Plan of Care (Addendum)
Called to update brother on current POC and patient status, but got voicemail.  Let message with call back number if needed.  Waiting on return call.  Returned call and updated. No further questions at this time.

## 2019-05-16 NOTE — Evaluation (Signed)
Occupational Therapy Evaluation Patient Details Name: Hayden [name] MRN: [id number] DOB: February 07, 1941 Today's Date: 05/16/2019    History of Present Illness 78 year old Caucasian male who is a resident of [facility] memory care unit who has a history of dementia, Rt femur fx Aug 2020 ([facility]), leukemia, diabetes mellitus, hyperlipidemia GERD and fibromyalgia was reportedly diagnosed with COVID-19 about 2 days prior to this admission. While he was in the emergency department he had projectile vomiting of brown emesis.  He aspirated.  He spiked a fever.  He became hypoxic. +bil PE; +DVT RLE; transitioned from Heparin to Lovenox 05/15/19   Clinical Impression   This 78 y/o male presents with the above. PTA pt residing in memory care unit, per staff was recently performing transfers to w/c and receiving assist for ADL tasks, able to self-feed. Pt requiring two person assist for safe completion of functional transfers. He currently requires up to [facility] for self feeding and grooming ADL, max-totalA for UB and LB ADL. Pt following simple commands given multimodal cues, very minimally verbalizing responses during session. He will benefit from continued acute care services and recommend pt return to previous memory care unit if they are able to continue providing necessary level of assist. Will follow.     Follow Up Recommendations  SNF;Supervision/Assistance - 24 hour(return to [facility] if able)    Equipment Recommendations  Other (comment)(defer to next venue)           Precautions / Restrictions Precautions Precautions: Fall Precaution Comments: per chart fell at [place] PTA (unwitnessed found on floor beside bed) Restrictions Weight Bearing Restrictions: No      Mobility Bed Mobility Overal bed mobility: Needs Assistance Bed Mobility: Rolling;Sidelying to Sit Rolling: Max assist Sidelying to sit: Total assist;+2 for physical assistance;HOB elevated       General bed  mobility comments: once cued, pt assists with movement however needs incr physical assist to complete  Transfers Overall transfer level: Needs assistance Equipment used: 2 person hand held assist Transfers: Sit to/from [facility] Sit to Stand: Mod assist;+2 physical assistance;+2 safety/equipment Stand pivot transfers: Mod assist;+2 physical assistance;+2 safety/equipment       General transfer comment: no grimacing or signs of pain; 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; he tries to counter-balance by forward flexion at hips/spine; small pivotal steps with tactile cues through bil UEs to direct from bed to chair on his left    Balance Overall balance assessment: Needs assistance;History of Falls Sitting-balance support: Bilateral upper extremity supported;Feet unsupported Sitting balance-Leahy Scale: Poor Sitting balance - Comments: slight posterior lean  Postural control: Posterior lean Standing balance support: Bilateral upper extremity supported;During functional activity Standing balance-Leahy Scale: Poor                             ADL either performed or assessed with clinical judgement   ADL Overall ADL's : Needs assistance/impaired Eating/Feeding: Minimal assistance;Set up;Sitting Eating/Feeding Details (indicate cue type and reason): required setup for breakfast Grooming: Minimal assistance;Moderate assistance;Sitting   Upper Body Bathing: Moderate assistance;Sitting   Lower Body Bathing: Maximal assistance;Total assistance;Sit to/from stand;Bed level   Upper Body Dressing : Moderate assistance;Sitting   Lower Body Dressing: Maximal assistance;Total assistance;+2 for physical assistance;Sit to/from stand;Bed level   Toilet Transfer: Moderate assistance;+2 for physical assistance;+2 for safety/equipment;Stand-pivot Toilet Transfer Details (indicate cue type and reason): simulated via transfer to [place] and Hygiene: Maximal assistance;+2  for safety/equipment;+2 for physical assistance;Sit to/from stand       Functional mobility during ADLs: Moderate assistance;+2 for physical assistance;+2 for safety/equipment(HHA) General ADL Comments: pt with impaired cognition, weakness, ?pain     Vision         Perception     Praxis      Pertinent Vitals/Pain Pain Assessment: Faces Faces Pain Scale: Hurts worst(tearful on initial attempts to move RLE, ? pain or anxiety) Pain Location: pt unable to verbalize or point Pain Descriptors / Indicators: Crying;Grimacing;Guarding Pain Intervention(s): Limited activity within patient's tolerance;Monitored during session;Repositioned     Hand Dominance     Extremity/Trunk Assessment Upper Extremity Assessment Upper Extremity Assessment: Generalized weakness;Difficult to assess due to impaired cognition   Lower Extremity Assessment Lower Extremity Assessment: Defer to PT evaluation   Cervical / Trunk Assessment Cervical / Trunk Assessment: Other exceptions Cervical / Trunk Exceptions: stiff/rigid when rolling   Communication Communication Communication: Other (comment)(frequently no verbal response; stated name, yes, no)   Cognition Arousal/Alertness: Awake/alert Behavior During Therapy: Anxious Overall Cognitive Status: No family/caregiver present to determine baseline cognitive functioning Area of Impairment: Orientation;Following commands;Safety/judgement;Awareness;Problem solving                 Orientation Level: Place;Time;Situation(cannot give DOB)     Following Commands: Follows one step commands with increased time Safety/Judgement: Decreased awareness of safety;Decreased awareness of deficits(multiple falls this year) Awareness: (pre-intellectual; difficult to assess due to decr cogtnition) Problem Solving: Slow processing;Decreased initiation;Requires verbal cues;Requires tactile cues;Difficulty  sequencing General Comments: appeared to grimace when PT lightly touched left foot; could bend his knees on command and reach to roll    General Comments  Called [facility] to verify his prior functional status. Staff member reported they do have therapy services available     Exercises     Shoulder Instructions      Home Living Family/patient expects to be discharged to:: Other (Comment)                                 Additional Comments: memory care unit      Prior Functioning/Environment Level of Independence: Needs assistance  Gait / Transfers Assistance Needed: per staff at [facility], pt has not returned to ambulating since hip fx in Aug--stand-pivots to w/c, "can stand some" and otherwise w/c bound ADL's / Homemaking Assistance Needed: was able to feed himself Communication / Swallowing Assistance Needed: very limited verbalizations          OT Problem List: Decreased strength;Decreased range of motion;Decreased activity tolerance;Impaired balance (sitting and/or standing);Decreased cognition;Decreased safety awareness;Decreased knowledge of use of DME or AE;Decreased knowledge of precautions;Cardiopulmonary status limiting activity;Pain      OT Treatment/Interventions: Self-care/ADL training;Therapeutic exercise;Energy conservation;DME and/or AE instruction;Therapeutic activities;Visual/perceptual remediation/compensation;Patient/family education;Balance training;Cognitive remediation/compensation    OT Goals(Current goals can be found in the care plan section) Acute Rehab OT Goals Patient Stated Goal: unable to state OT Goal Formulation: Patient unable to participate in goal setting Time For Goal Achievement: 05/30/19 Potential to Achieve Goals: Fair  OT Frequency: Min 2X/week   Barriers to D/C:            Co-evaluation PT/OT/SLP Co-Evaluation/Treatment: Yes Reason for Co-Treatment: For patient/therapist safety;To address functional/ADL  transfers   OT goals addressed during session: ADL's and self-care      AM-PAC OT "6 Clicks" Daily Activity     Outcome Measure Help from another person eating meals?:  A Lot Help from another person taking care of personal grooming?: A Lot Help from another person toileting, which includes using toliet, bedpan, or urinal?: Total Help from another person bathing (including washing, rinsing, drying)?: A Lot Help from another person to put on and taking off regular upper body clothing?: A Lot Help from another person to put on and taking off regular lower body clothing?: Total 6 Click Score: 10   End of Session Nurse Communication: Mobility status  Activity Tolerance: Patient tolerated treatment well Patient left: in chair;with call bell/phone within reach;with chair alarm set  OT Visit Diagnosis: Unsteadiness on feet (R26.81);Muscle weakness (generalized) (M62.81);Other symptoms and signs involving cognitive function                Time: 0923-1000 OT Time Calculation (min): 37 min Charges:  OT General Charges $OT Visit: 1 Visit OT Evaluation $OT Eval Moderate Complexity: [place], OT [employer] Pager [phone] Office [place] 05/16/2019, 4:31 PM

## 2019-05-16 NOTE — Progress Notes (Signed)
Pharmacy Antibiotic Note  Hayden [name] is a 78 y.o. male presented to the ED from [facility] on 05/13/2019 due to positive COVID test.  He had n/v in the ED and was suspected to aspirate while lying flat.  Pharmacy has been consulted to dose unasyn for aspiration PNA.  D4 of antibiotics, no recent fever, WBC stable  Plan: Continue Unasyn 3g IV q6h Monitor clinical picture, renal function, consider stopping Unasyn after 5 days?   ________________________________________  Temp (24hrs), Avg:99.1 F (37.3 C), Min:98.7 F (37.1 C), Max:99.4 F (37.4 C)  Recent Labs  Lab 05/11/19 1203 05/13/19 1036 05/13/19 1519 05/14/19 0326 05/15/19 0035 05/16/19 0226  WBC 9.3  --  10.7* 9.5 10.1 13.5*  CREATININE 0.96 0.60* 0.67 0.63 0.61 0.69  LATICACIDVEN  --   --  1.9  --   --   --     Estimated Creatinine Clearance: 66.7 mL/min (by C-G formula based on SCr of 0.69 mg/dL).    Allergies  Allergen Reactions  . Bee Venom Other (See Comments)    Bump where stung and took days to go away  . Glucophage [Metformin] Diarrhea    "I will never take it again - stool incontinent"  . Shellfish Allergy Swelling  . Grass Extracts [Gramineae Pollens] Other (See Comments)    Unknown rxn  . Tobacco [Tobacco] Other (See Comments)    Unknown rxn     Thank you for allowing pharmacy to be a part of this patient's care.  Darnelle Bos 05/16/2019 8:39 AM

## 2019-05-16 NOTE — Progress Notes (Signed)
Called and updated his sister patient being emotional tonight.  Patient was crying earlier and admitted he was scared.  Sister states when he is lucid he get melancholy about his situation.  Reassured patient and family that he was going to be okay.  Answered family questions at this time.

## 2019-05-16 NOTE — Progress Notes (Signed)
Inpatient Diabetes Program Recommendations  AACE/ADA: New Consensus Statement on Inpatient Glycemic Control (2015)  Target Ranges:  Prepandial:   less than 140 mg/dL      Peak postprandial:   less than 180 mg/dL (1-2 hours)      Critically ill patients:  140 - 180 mg/dL   Lab Results  Component Value Date   GLUCAP 127 (H) 05/16/2019   HGBA1C 6.9 (H) 05/14/2019    Review of Glycemic Control Results for Hayden [name], Hayden [name] (MRN [id number]) as of 05/16/2019 10:38  Ref. Range 05/15/2019 16:02 05/15/2019 22:39 05/16/2019 02:07 05/16/2019 02:58 05/16/2019 07:56  Glucose-Capillary Latest Ref Range: 70 - 99 mg/dL 360 (H) 248 (H) 202 (H) 197 (H) 127 (H)   Diabetes history: Type 2 DM Outpatient Diabetes medications: Humalog 6 units TID, Levemir 24 units QHS Current orders for Inpatient glycemic control: Levemir 8 units QHS, Novolog 0-9 units Q4H Decadron 6 mg Q12H  Inpatient Diabetes Program Recommendations:    Consider adding Levemir 6 units QD.  Did note previous reduction to basal, however hypoglycemic episode may have been attributed to short acting insulin doses given within 2 hours of each other. Of note: Insulin must be given within one hour of last CBG.  Thanks, Bronson Curb, MSN, RNC-OB Diabetes Coordinator [phone] (8a-5p)

## 2019-05-16 NOTE — Progress Notes (Signed)
  Speech Language Pathology Treatment: Dysphagia  Patient Details Name: Hayden [name] MRN: [id number] DOB: April 03, 1941 Today's Date: 05/16/2019 Time: [id number] SLP Time Calculation (min) (ACUTE ONLY): 21 min  Assessment / Plan / Recommendation Clinical Impression  Pt amenable to eating Magic Cup and drinking Glucerna from lunch tray. Repositioned in bed and provided oral care.  Pt fed himself Magic Cup without difficulty, requiring occasional prompts to persist through task. Sips of Glucerna (which is a thin liquid) continued to elicit consistent coughing as noted per swallow assessment two days ago.  Nectar thick liquids were tolerated without overt difficulty.  Pt responded verbally to some questions; otherwise he was quiet.  SLP will follow for safety and to determine if instrumental swallow study may be beneficial.   HPI HPI: Patient is a 78 y.o. male a resident of [facility] with history of dementia, leukemia, diabetes mellitus, hyperlipidemia, GERD, fibromyalgia. Patient was reportedly diagnosed positive for Covid 2 days ago at the facility.  But he did not have any symptoms.  For some unknown reason, the supervisor at the facility called EMS and sent the patient to the ED. While in the ED, patient had projectile vomiting of brown emesis.  RN immediately suctioned patient's mouth but possible aspiration was noted.  He is spiked a fever of 103.3, became tachycardic up to 120 and required up to 8 L of oxygen by nasal cannula. CXR shows Mild bilateral lower lobe hazy lung opacities compatible with the clinical history of COVID-19 positivity. Pt then found to have had a large PE on CTA and put on heparin. Later, pt was given a sip of water by RN and had some coughing. Pt made NPO.       SLP Plan  Continue with current plan of care       Recommendations  Diet recommendations: Dysphagia 2 (fine chop);Nectar-thick liquid Liquids provided via: Cup;Straw Supervision: Intermittent supervision  to cue for compensatory strategies Compensations: Slow rate;Small sips/bites Postural Changes and/or Swallow Maneuvers: Seated upright 90 degrees                Oral Care Recommendations: Oral care BID Follow up Recommendations: Skilled Nursing facility SLP Visit Diagnosis: Dysphagia, oropharyngeal phase (R13.12) Plan: Continue with current plan of care       GO              Flonnie Wierman L. Tivis Ringer, [place] Office number [phone] Pager [phone]   Juan Quam Laurice 05/16/2019, 2:47 PM

## 2019-05-16 NOTE — Evaluation (Signed)
Physical Therapy Evaluation Patient Details Name: Hayden [name] MRN: [id number] DOB: [birth year] Today's Date: 05/16/2019   History of Present Illness  78 year old Caucasian male who is a resident of [facility] memory care unit who has a history of dementia, Rt femur fx Aug 2020 (WBAT), leukemia, diabetes mellitus, hyperlipidemia GERD and fibromyalgia was reportedly diagnosed with COVID-19 about 2 days prior to this admission. While he was in the emergency department he had projectile vomiting of brown emesis.  He aspirated.  He spiked a fever.  He became hypoxic. +bil PE; +DVT RLE; transitioned from Heparin to Lovenox 05/15/19  Clinical Impression   Pt admitted with above diagnosis. Patient with very limited verbalizations during evaluation, but did follow commands with incr time. Per staff at [facility], he has been wheelchair-bound since his fall/hip fracture in Aug 2020 (despite being WBAT). He was ambulating with assist with RW prior to Aug, but has had multiple falls this year. Today he required 2 person assist to pivot bed to chair due to posterior lean.  Pt currently with functional limitations due to the deficits listed below (see PT Problem List). Pt may benefit from skilled PT to increase their independence and safety with mobility to allow discharge to the venue listed below.       Follow Up Recommendations SNF (per staff at [facility], this is provided in his facility)    Equipment Recommendations  Other (comment)(TBD at next venue)    Recommendations for Other Services       Precautions / Restrictions Precautions Precautions: Fall Precaution Comments: per chart fell at [place] [facility] (unwitnessed found on floor beside bed) Restrictions Weight Bearing Restrictions: No      Mobility  Bed Mobility Overal bed mobility: Needs Assistance Bed Mobility: Rolling;Sidelying to Sit Rolling: Max assist Sidelying to sit: Total assist;+2 for physical assistance;HOB  elevated       General bed mobility comments: once cued, pt assists with movement however needs incr physical assist to complete  Transfers Overall transfer level: Needs assistance Equipment used: 2 person hand held assist Transfers: Sit to/from Stand;Stand Pivot Transfers Sit to Stand: Mod assist;+2 physical assistance;+2 safety/equipment Stand pivot transfers: Mod assist;+2 physical assistance;+2 safety/equipment       General transfer comment: no grimacing or signs of pain; 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; he tries to counter-balance by forward flexion at hips/spine; small pivotal steps with tactile cues through bil UEs to direct from bed to chair on his left  Ambulation/Gait             General Gait Details: unable at this time  Stairs            Wheelchair Mobility    Modified Rankin (Stroke Patients Only)       Balance Overall balance assessment: Needs assistance;History of Falls Sitting-balance support: Bilateral upper extremity supported;Feet unsupported Sitting balance-Leahy Scale: Poor Sitting balance - Comments: slight posterior lean  Postural control: Posterior lean Standing balance support: Bilateral upper extremity supported;During functional activity Standing balance-Leahy Scale: Poor                               Pertinent Vitals/Pain Pain Assessment: Faces Faces Pain Scale: Hurts worst(tearful on initial attempts to move RLE, ? pain or anxiety) Pain Location: pt unable to verbalize or point Pain Descriptors / Indicators: Crying;Grimacing;Guarding Pain Intervention(s): Limited activity within patient's tolerance;Monitored during session;Repositioned    Home Living  Family/patient expects to be discharged to:: Other (Comment)                 Additional Comments: memory care unit    Prior Function Level of Independence: Needs assistance   Gait / Transfers Assistance Needed: per staff at [facility], pt has not returned to ambulating since hip fx in Aug--stand-pivots to w/c, "can stand some" and otherwise w/c bound  ADL's / Homemaking Assistance Needed: was able to feed himself        Hand Dominance        Extremity/Trunk Assessment   Upper Extremity Assessment Upper Extremity Assessment: Defer to OT evaluation    Lower Extremity Assessment Lower Extremity Assessment: RLE deficits/detail;LLE deficits/detail;Difficult to assess due to impaired cognition RLE Deficits / Details: in supine only bends his knees ~30 degree and then resists further movement; in chair, holds his knees nearly fully extended with feet off the floor, but with cues and assist could bend knees to rest feet on the floor ~80 degrees; 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; able to bear weight with no buckling with steps LLE Deficits / Details: in supine only bends his knees ~30 degree and then resists further movement; in chair, holds his knees nearly fully extended with feet off the floor, but with cues and assist could bend knees to rest feet on the floor ~80 degrees; plantarflexion contracture ~25 degrees; able to bear weight with no buckling with steps    Cervical / Trunk Assessment Cervical / Trunk Assessment: Other exceptions Cervical / Trunk Exceptions: stiff/rigid when rolling  Communication   Communication: Other (comment)(frequently no verbal response; stated name, yes, no)  Cognition Arousal/Alertness: Awake/alert Behavior During Therapy: Anxious Overall Cognitive Status: No family/caregiver present to determine baseline cognitive functioning Area of Impairment: Orientation;Following commands;Safety/judgement;Awareness;Problem solving                 Orientation Level: Place;Time;Situation(cannot give DOB)     Following Commands: Follows one step commands with increased time Safety/Judgement: Decreased awareness of safety;Decreased awareness of deficits(multiple falls this  year) Awareness: (pre-intellectual; difficult to assess due to decr cogtnition) Problem Solving: Slow processing;Decreased initiation;Requires verbal cues;Requires tactile cues;Difficulty sequencing General Comments: appeared to grimace when PT lightly touched left foot; could bend his knees on command and reach to roll       General Comments General comments (skin integrity, edema, etc.): Called [facility] to verify his prior functional status. Staff member reported they do have therapy services available     Exercises     Assessment/Plan    PT Assessment Patient needs continued PT services  PT Problem List Decreased strength;Decreased range of motion;Decreased balance;Decreased mobility;Decreased cognition;Decreased knowledge of use of DME;Decreased safety awareness;Pain       PT Treatment Interventions DME instruction;Gait training;Functional mobility training;Therapeutic activities;Therapeutic exercise;Balance training;Cognitive remediation;Patient/family education;Wheelchair mobility training    PT Goals (Current goals can be found in the Care Plan section)  Acute Rehab PT Goals Patient Stated Goal: unable to state PT Goal Formulation: Patient unable to participate in goal setting Time For Goal Achievement: 05/30/19 Potential to Achieve Goals: Fair    Frequency Min 2X/week   Barriers to discharge        Co-evaluation               AM-PAC PT "6 Clicks" Mobility  Outcome Measure Help needed turning from your back to your side while in a flat bed without using bedrails?: Total Help needed moving from lying on your back to  sitting on the side of a flat bed without using bedrails?: Total Help needed moving to and from a bed to a chair (including a wheelchair)?: A Lot Help needed standing up from a chair using your arms (e.g., wheelchair or bedside chair)?: A Lot Help needed to walk in hospital room?: Total Help needed climbing 3-5 steps with a railing? : Total 6  Click Score: 8    End of Session   Activity Tolerance: Patient tolerated treatment well(adjusted himself in chair with good UE strength & seemed com) Patient left: in chair;with call bell/phone within reach;with chair alarm set;with nursing/sitter in room Nurse Communication: Mobility status;Other (comment)(verified chair alarm will ring through to RN station) PT Visit Diagnosis: Other abnormalities of gait and mobility (R26.89);Unsteadiness on feet (R26.81);History of falling (Z91.81);Muscle weakness (generalized) (M62.81)    Time: [id number] PT Time Calculation (min) (ACUTE ONLY): 24 min   Charges:   PT Evaluation $PT Eval Moderate Complexity: 1 Mod           Barry Brunner, PT    [place] [facility] Jesus Nevills 05/16/2019, 10:23 AM

## 2019-05-16 NOTE — NC FL2 (Signed)
[place] LEVEL OF CARE SCREENING TOOL     IDENTIFICATION  Patient Name: Hayden [name] Birthdate: Mar 31, 1941 Sex: male Admission Date (Current Location): 05/13/2019  [facility] and [place] Number:  Herbalist and Address:  [facility], [place] [address], [place], [place] [address]([place])      Provider Number: [id number]  Attending Physician Name and Address:  Bonnielee Haff, MD  Relative Name and Phone Number:  Divonte Fitzmorris [phone]    Current Level of Care: Hospital Recommended Level of Care: [place] Prior Approval Number:    Date Approved/Denied:   PASRR Number: [id number]  Discharge Plan: SNF    Current Diagnoses: Patient Active Problem List   Diagnosis Date Noted  . Acute pulmonary embolus ([place]) 05/14/2019  . Acute pulmonary embolism ([place]) 05/14/2019  . Pneumonia due to COVID-19 virus   . Aspiration pneumonitis ([place]) 05/13/2019  . Closed fracture of right hip ([place])   . Femur fracture, right ([place]) 02/10/2019  . Fall   . Acute encephalopathy 07/02/2018  . Dementia without behavioral disturbance ([place]) 05/24/2018  . Pressure injury of skin 05/23/2018  . Cellulitis 04/17/2018  . Cellulitis, scrotum 03/09/2018  . Cellulitis of male genitalia 03/08/2018  . Depression 03/08/2018  . BPH (benign prostatic hyperplasia) 03/08/2018  . Leukocytosis 03/08/2018  . Mild dementia ([place]) 10/23/2017  . Lower urinary tract infectious disease 09/06/2017  . Hyperglycemia 09/06/2017  . ARF (acute renal failure) ([place]) 09/06/2017  . Altered mental status 09/06/2017  . Type 2 diabetes mellitus without complication, with long-term current use of insulin ([place]) 04/02/2017  . 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 Bilateral hand pain 12/09/2016  . CLL (chronic lymphocytic leukemia) ([place]) 09/01/2013    Orientation RESPIRATION BLADDER Height & Weight      Self, Place  Normal(Room air) Indwelling catheter Weight: 138 lb 14.2 oz (63 kg) Height:  5' 5.98" (167.6 cm)  BEHAVIORAL SYMPTOMS/MOOD NEUROLOGICAL BOWEL NUTRITION STATUS      Incontinent Diet(Mechanical soft)  AMBULATORY STATUS COMMUNICATION OF NEEDS Skin   Extensive Assist Verbally Bruising(Bruising R and Left Leg Arm and Buttock; pressure wound sacrum mid, upper stage 1 w/ foam dressing; Pressure wound, right Buttock stage w/ foam dressing.)                       Personal Care Assistance Level of Assistance  Feeding, Bathing, Dressing Bathing Assistance: Maximum assistance Feeding assistance: Independent Dressing Assistance: Maximum assistance     Functional Limitations Info             SPECIAL CARE FACTORS FREQUENCY  PT (By licensed PT), OT (By licensed OT), Speech therapy     PT Frequency: 5x/week OT Frequency: 5x/week     Speech Therapy Frequency: 2x/week      Contractures Contractures Info: Present(ankle contracture)    Additional Factors Info  Code Status, Allergies, Isolation Precautions Code Status Info: DNR Allergies Info: Bee venom, Metformin, Shellfish, Grass extracts, Tobacco     Isolation Precautions Info: Airborne/Contact precautions     Current Medications (05/16/2019):  This is the current hospital active medication list Current Facility-Administered Medications  Medication Dose Route Frequency Provider Last Rate Last Dose  . acetaminophen (TYLENOL) tablet 650 mg  650 mg Oral Q6H PRN Dahal, Marlowe Aschoff, MD   650 mg at 05/15/19 2241   Or  . acetaminophen (TYLENOL) suppository 650 mg  650 mg Rectal Q6H PRN  Terrilee Croak, MD   650 mg at 05/13/19 2240  . amLODipine (NORVASC) tablet 10 mg  10 mg Oral Daily Bonnielee Haff, MD   10 mg at 05/16/19 0944  . Ampicillin-Sulbactam (UNASYN) 3 g in sodium chloride 0.9 % 100 mL IVPB  3 g Intravenous Q6H Bonnielee Haff, MD 200 mL/hr at 05/16/19 0953 3 g at 05/16/19 0953  . aspirin chewable tablet 81 mg  81 mg  Oral Daily Dahal, Marlowe Aschoff, MD   81 mg at 05/16/19 0944  . celecoxib (CELEBREX) capsule 200 mg  200 mg Oral BID Terrilee Croak, MD   200 mg at 05/16/19 0941  . dexamethasone (DECADRON) injection 6 mg  6 mg Intravenous Q12H Bonnielee Haff, MD   6 mg at 05/16/19 0944  . donepezil (ARICEPT) tablet 10 mg  10 mg Oral QHS Terrilee Croak, MD   10 mg at 05/15/19 2241  . enoxaparin (LOVENOX) injection 95 mg  1.5 mg/kg Subcutaneous Q24H Bonnielee Haff, MD   95 mg at 05/16/19 1308  . feeding supplement (GLUCERNA SHAKE) (GLUCERNA SHAKE) liquid 237 mL  237 mL Oral TID BM Bonnielee Haff, MD   237 mL at 05/16/19 1309  . hydrALAZINE (APRESOLINE) injection 5-10 mg  5-10 mg Intravenous Q4H PRN Etta Quill, DO      . insulin aspart (novoLOG) injection 0-9 Units  0-9 Units Subcutaneous Q4H Etta Quill, DO   3 Units at 05/16/19 1135  . insulin detemir (LEVEMIR) injection 8 Units  8 Units Subcutaneous QHS Bonnielee Haff, MD   8 Units at 05/15/19 2241  . magnesium hydroxide (MILK OF MAGNESIA) suspension 30 mL  30 mL Oral Daily PRN Dahal, Marlowe Aschoff, MD      . ondansetron (ZOFRAN) tablet 4 mg  4 mg Oral Q6H PRN Dahal, Binaya, MD       Or  . ondansetron (ZOFRAN) injection 4 mg  4 mg Intravenous Q6H PRN Dahal, Binaya, MD      . oxyCODONE (Oxy IR/ROXICODONE) immediate release tablet 2.5 mg  2.5 mg Oral Q4H PRN Dahal, Binaya, MD      . pantoprazole (PROTONIX) EC tablet 40 mg  40 mg Oral Daily Dahal, Binaya, MD   40 mg at 05/16/19 0945  . remdesivir 100 mg in sodium chloride 0.9 % 250 mL IVPB  100 mg Intravenous Q24H Bonnielee Haff, MD 500 mL/hr at 05/16/19 0956 100 mg at 05/16/19 0956  . Resource ThickenUp Clear   Oral PRN Bonnielee Haff, MD      . risperiDONE (RISPERDAL) tablet 1 mg  1 mg Oral Daily PRN Dahal, Marlowe Aschoff, MD      . senna (SENOKOT) tablet 8.6 mg  1 tablet Oral BID Dahal, Marlowe Aschoff, MD   8.6 mg at 05/16/19 0945  . sertraline (ZOLOFT) tablet 50 mg  50 mg Oral Daily Dahal, Marlowe Aschoff, MD   50 mg at 05/16/19 0945   . sodium chloride flush (NS) 0.9 % injection 3 mL  3 mL Intravenous Q12H Dahal, Marlowe Aschoff, MD   3 mL at 05/16/19 0946  . sodium phosphate (FLEET) 7-19 GM/118ML enema 1 enema  1 enema Rectal Once PRN Dahal, Binaya, MD      . sorbitol 70 % solution 30 mL  30 mL Oral Daily PRN Dahal, Binaya, MD      . tamsulosin (FLOMAX) capsule 0.4 mg  0.4 mg Oral Daily Dahal, Binaya, MD   0.4 mg at 05/16/19 0946  . traZODone (DESYREL) tablet 50 mg  50 mg Oral Q8H PRN Dahal,  Marlowe Aschoff, MD         Discharge Medications: Please see discharge summary for a list of discharge medications.  Relevant Imaging Results:  Relevant Lab Results:   Additional Information ss# [id number]  Kirstie Peri, Student-Social Work

## 2019-05-16 NOTE — Progress Notes (Signed)
 Initial Nutrition Assessment  DOCUMENTATION CODES:   Not applicable  INTERVENTION:   Continue Glucerna Shake po TID, each supplement provides 220 kcal and 10 grams of protein, as this is the supplement patient prefers  Pt receiving Hormel Shake daily with Breakfast which provides 520 kcals and 22 g of protein and Magic cup BID with lunch and dinner, each supplement provides 290 kcal and 9 grams of protein, automatically on meal trays to optimize nutritional intake.   Feeding assistance and encouragement at meal times   NUTRITION DIAGNOSIS:   Inadequate oral intake related to acute illness, decreased appetite as evidenced by meal completion < 50%.  GOAL:   Patient will meet greater than or equal to 90% of their needs  MONITOR:   PO intake, Supplement acceptance, Labs, Weight trends  REASON FOR ASSESSMENT:   Malnutrition Screening Tool    ASSESSMENT:   78 yo male admitted with acute respiratory failure due to COVID-19 pneumonia/aspriation pneumonia, acute PE. PMH includes dementia, DM, HLD, GERD  Recorded po intake 10-50%, recorded po intake 25% on average.   Pt with projectile vomiting on admission with suspected aspiration  Unable to obtain diet and weight history from patient at this time. Weight has fluctuated per weight encounters, possible recent weight loss although current wt is similar to weight from beginning of this year.   Labs: CBGs 127-360 Meds: decadron, ss novolog, levemir   NUTRITION - FOCUSED PHYSICAL EXAM:  Working remotely  Diet Order:   Diet Order            DIET DYS 2 Room service appropriate? Yes; Fluid consistency: Nectar Thick  Diet effective now              EDUCATION NEEDS:   Not appropriate for education at this time  Skin:  Skin Assessment: Skin Integrity Issues: Skin Integrity Issues:: Stage I, Stage II Stage I: sacrum Stage II: buttocks  Last BM:  11/15  Height:   Ht Readings from Last 1 Encounters:  05/14/19 5'  5.98" (1.676 m)    Weight:   Wt Readings from Last 1 Encounters:  05/14/19 63 kg    BMI:  Body mass index is 22.43 kg/m.  Estimated Nutritional Needs:   Kcal:  [id number] kcals  Protein:  88-98 g  Fluid:  >/= 1.8 L    Delvon Chipps MS, RDN, LDN, CNSC [phone] Pager  [phone] Weekend/On-Call Pager

## 2019-05-16 NOTE — Progress Notes (Addendum)
Hayden Hayden [name], No Pcp Per  Brief History/Interval Summary: 78 year old Caucasian male who is a resident of [facility] who has a history of dementia, leukemia, diabetes mellitus, hyperlipidemia GERD and fibromyalgia was reportedly diagnosed with COVID-19 about 2 days prior to this admission.  He did not have any symptoms.  For unclear reason Hayden [name] was sent over to the emergency department.  Evaluation in the emergency department did reveal a elevated CRP of 25.7 lactic acid of 1.9 procalcitonin less than 0.1.  Chest x-ray showed patchy density in the right lung base suspicious for pneumonia.  This was from 11/11.  Chest x-ray from day of admission showed improvement in this patchy opacity.  While he was in the emergency department he had projectile vomiting of brown emesis.  He aspirated.  He spiked a fever.  He became hypoxic.  He was stabilized and then transferred to this hospital for further evaluation and management.  Reason for Visit: Aspiration pneumonia.  Pneumonia due to COVID-19.  Pulmonary embolism  Consultants: None  Procedures:   Transthoracic echocardiogram 11/14    1. Left ventricular ejection fraction, by visual estimation, is 55 to 60%, although assessment is extremelty limited due to poor visualization. Would recommend contrast enhancement in the future.  2. Global right ventricle was not well visualized.The right ventricular size is not well visualized. Right vetricular wall thickness was not assessed.  3. Left atrial size was not well visualized.  4. Right atrial size was not well visualized.  5. Mild mitral annular calcification.  6. The mitral valve was not well visualized. unable to visualize mitral valve regurgitation.  7. The tricuspid valve is not well visualized. Tricuspid valve regurgitation unable to visualize.  8. Aortic valve regurgitation unable to visualize.  9. The aortic  valve was not well visualized. Aortic valve regurgitation unable to visualize. unable to visualize. 10. The pulmonic valve was not well visualized. Pulmonic valve regurgitation is moderate. 11. The interatrial septum was not well visualized.  Antibiotics: Anti-infectives (From admission, onward)   Start     Dose/Rate Route Frequency Ordered Stop   05/15/19 1000  remdesivir 100 mg in sodium chloride 0.9 % 250 mL IVPB     100 mg 500 mL/hr over 30 Minutes Intravenous Every 24 hours 05/14/19 1002 05/19/19 0959   05/14/19 1100  remdesivir 200 mg in sodium chloride 0.9 % 250 mL IVPB     200 mg 500 mL/hr over 30 Minutes Intravenous Once 05/14/19 1002 05/14/19 1340   05/14/19 0011  Ampicillin-Sulbactam (UNASYN) 3 g in sodium chloride 0.9 % 100 mL IVPB  Status:  Discontinued     3 g 200 mL/hr over 30 Minutes Intravenous Every 8 hours 05/14/19 0011 05/14/19 0018   05/13/19 2200  Ampicillin-Sulbactam (UNASYN) 3 g in sodium chloride 0.9 % 100 mL IVPB     3 g 200 mL/hr over 30 Minutes Intravenous Every 6 hours 05/13/19 1827     05/13/19 1530  Ampicillin-Sulbactam (UNASYN) 3 g in sodium chloride 0.9 % 100 mL IVPB     3 g 200 mL/hr over 30 Minutes Intravenous  Once 05/13/19 1529 05/13/19 1823      Subjective/Interval History: Hayden [name] remains confused.  No distress.  States that he does not have any pain.  Does not answer many questions.    Assessment/Plan:  Acute Hypoxic Resp. Failure/Pneumonia due to COVID-19/aspiration pneumonia  COVID-19 Labs  Recent Labs    05/13/19 1519  05/15/19 0035 05/16/19 0226  DDIMER >20.00*  --   --   FERRITIN 311  --   --   LDH 298*  --   --   CRP 25.7* 20.0* 14.3*    Hayden [name]'s COVID-19 test report is imported to epic under 'Media' in chart review.  This was done at [facility] on 04/30/2019.  Fever: Remains afebrile Oxygen requirements: Saturating in the 90s on room air Antibacterials: Unasyn Remdesivir: Day 3 Steroids: Dexamethasone  6 mg every 12 hours Diuretics: Not on diuretics on a scheduled basis Actemra: No Actemra due to possibility of superimposed bacterial infection DVT Prophylaxis: Currently on Lovenox for PE  Hayden [name]'s respiratory status has stabilized.  He saturating normal on room air.  His inflammatory markers have improved with CRP down to 14.3 from 25.7.  Hayden [name] remains on remdesivir and steroids.  He is also on Unasyn due to concern for aspiration pneumonia.  We will stop it after 5 days.  Elevated D-dimer noted.  See below.  ALT 50.  Leukocytosis is due to steroids.  Acute pulmonary embolism and acute right lower extremity DVT D-Dimer was noted to be greater than 20.  CT angiogram shows bilateral pulmonary embolism.  No evidence for saddle emboli.  No right heart strain.  Hayden [name] is stable.  Echocardiogram as above.  Normal EF was noted.  Plan was to transition him to an oral anticoagulant however due to the fact that Hayden [name] was on carbamazepine this could not be done.  He was transitioned to Lovenox instead.  I have discussed carbamazepine with the Hayden [name]'s provider at [facility], Ms. Stormy Fabian, PA.  He was started on this primarily for behavioral issues and they are okay with stopping this medication if needed.  Hayden [name] without any history of seizure disorder.  Discussed with pharmacy.  Plan will be to stop the carbamazepine today.  Since he is not getting this a seizure we do not need to taper this down.  We will transition to oral anticoagulation after 48 hours.    History of dementia Continue home medications.  He is noted to be on Aricept.  Hayden [name] on carbamazepine mainly for behavioral issues.  He is also noted to be on Risperdal and lorazepam as needed at the skilled nursing facility.    Diabetes mellitus type 2 with episodes of hypoglycemia and hyperglycemia Elevated CBG due to steroids.  Of hypoglycemia yesterday morning.  Dose of insulin was decreased.  HbA1c 6.9.  Continue SSI.     Elevated  blood pressure Unclear if he has a history of essential hypertension or not.  Home medication list does not show any blood pressure lowering agents.  He has been on hydralazine as needed.  Some of his elevated blood pressure likely due to anxiety.  Steroid could also be contributing.  Hayden [name] started on amlodipine.  Continue to monitor.   Hypokalemia Repleted  Normocytic anemia Stable hemoglobin.  Continue to monitor.  No evidence of overt blood loss.  History of BPH Continue with Flomax.   DVT Prophylaxis: On full dose Lovenox currently PUD Prophylaxis: PPI Code Status: DNR Family Communication: Sister being updated on a daily basis Disposition Plan: PT and OT evaluation.  Should be able to return to skilled nursing facility when ready for discharge.     Medications:  Scheduled:  amLODipine  10 mg Oral Daily   aspirin  81 mg Oral Daily   carbamazepine  300 mg Oral BID   celecoxib  200 mg Oral BID  dexamethasone (DECADRON) injection  6 mg Intravenous Q12H   donepezil  10 mg Oral QHS   enoxaparin (LOVENOX) injection  1.5 mg/kg Subcutaneous Q24H   feeding supplement (GLUCERNA SHAKE)  237 mL Oral TID BM   insulin aspart  0-9 Units Subcutaneous Q4H   insulin detemir  8 Units Subcutaneous QHS   pantoprazole  40 mg Oral Daily   senna  1 tablet Oral BID   sertraline  50 mg Oral Daily   sodium chloride flush  3 mL Intravenous Q12H   tamsulosin  0.4 mg Oral Daily   Continuous:  ampicillin-sulbactam (UNASYN) IV 3 g (05/16/19 0953)   remdesivir 100 mg in NS 250 mL 100 mg (05/16/19 0956)   [id number] **OR** acetaminophen, hydrALAZINE, magnesium hydroxide, ondansetron **OR** ondansetron (ZOFRAN) IV, oxyCODONE, Resource ThickenUp Clear, risperiDONE, sodium phosphate, sorbitol, traZODone, zolpidem   Objective:  Vital Signs  Vitals:   05/16/19 0400 05/16/19 0430 05/16/19 0500 05/16/19 0600  BP: (!) 142/69     Pulse: 80 83 78 80  Resp: 20 (!) 22 18 18     Temp:      TempSrc:      SpO2: (!) 88% 93% 95% 93%  Weight:      Height:        Intake/Output Summary (Last 24 hours) at 05/16/2019 1052 Last data filed at 05/16/2019 0500 Gross per 24 hour  Intake 1569.45 ml  Output 1325 ml  Net 244.45 ml   Filed Weights   05/14/19 0026  Weight: 63 kg    General appearance: Awake alert.  In no distress.  Distracted.  Anxious. Resp: Normal effort noted today.  Coarse breath sound bilaterally with crackles at the bases.  No wheezing or rhonchi.   Cardio: S1-S2 is normal regular.  No S3-S4.  No rubs murmurs or bruit GI: Abdomen is soft.  Nontender nondistended.  Bowel sounds are present normal.  No masses organomegaly Extremities: No edema.  Moving all his extremities.  Some deconditioning noted. Neurologic: Disoriented.  No obvious focal neurological deficits.    Lab Results:  Data Reviewed: I have personally reviewed following labs and imaging studies  CBC: Recent Labs  Lab 05/11/19 1203 05/13/19 1036 05/13/19 1519 05/14/19 0326 05/15/19 0035 05/16/19 0226  WBC 9.3  --  10.7* 9.5 10.1 13.5*  NEUTROABS 5.7  --  6.8  --   --   --   HGB 11.9* 13.9 13.3 12.0* 11.9* 12.4*  HCT 36.0* 41.0 39.8 36.2* 35.6* 36.4*  MCV 93.8  --  92.3 92.3 92.0 90.1  PLT 177  --  195 184 205 [id number]    Basic Metabolic Panel: Recent Labs  Lab 05/11/19 1203 05/13/19 1036 05/13/19 1519 05/14/19 0326 05/15/19 0035 05/16/19 0226  NA 136 141 141 143 143 144  K 3.4* 4.2 3.6 3.1* 4.0 4.0  CL 105 104 105 108 110 109  CO2 21*  --  24 25 24 25   GLUCOSE 170* 211* 168* 138* 287* 189*  BUN 28* 34* 25* 28* 25* 22  CREATININE 0.96 0.60* 0.67 0.63 0.61 0.69  CALCIUM 7.9*  --  8.9 8.4* 8.3* 8.6*  MG  --   --   --   --  1.9  --     GFR: Estimated Creatinine Clearance: 66.7 mL/min (by C-G formula based on SCr of 0.69 mg/dL).  Liver Function Tests: Recent Labs  Lab 05/11/19 1203 05/13/19 1519 05/15/19 0035 05/16/19 0226  AST 41 33 71* 34  ALT 26 34 57*  50*  ALKPHOS 85 98 86 93  BILITOT 0.9 0.7 0.7 0.7  PROT 6.0* 6.6 5.7* 6.4*  ALBUMIN 3.5 3.4* 2.8* 3.2*     Coagulation Profile: Recent Labs  Lab 05/11/19 1203  INR 1.2    HbA1C: Recent Labs    05/14/19 0326  HGBA1C 6.9*    CBG: Recent Labs  Lab 05/15/19 1602 05/15/19 2239 05/16/19 0207 05/16/19 0258 05/16/19 0756  GLUCAP 360* 248* 202* 197* 127*    Lipid Profile: Recent Labs    05/13/19 1519  TRIG 115    Anemia Panel: Recent Labs    05/13/19 1519  FERRITIN 311    Recent Results (from the past 240 hour(s))  Blood Culture (routine x 2)     Status: None (Preliminary result)   Collection Time: 05/13/19  3:19 PM   Specimen: BLOOD LEFT ARM  Result Value Ref Range Status   Specimen Description   Final    BLOOD LEFT ARM Performed at [facility], [place] [address] [place], [place] [address]    Special Requests   Final    BOTTLES DRAWN AEROBIC ONLY Blood Culture adequate volume Performed at [facility], [address], [place], [place] [address]    Culture   Final    NO GROWTH 3 DAYS Performed at [facility], [place] [address] [place], [place] [address]    Report Status PENDING  Incomplete  Blood Culture (routine x 2)     Status: None (Preliminary result)   Collection Time: 05/13/19  3:19 PM   Specimen: BLOOD RIGHT HAND  Result Value Ref Range Status   Specimen Description   Final    BLOOD RIGHT HAND Performed at [facility], [place] [address] [place], [place] [address]    Special Requests   Final    BOTTLES DRAWN AEROBIC ONLY Blood Culture adequate volume Performed at [facility], [address] [place], [place] [address]    Culture   Final    NO GROWTH 3 DAYS Performed at [facility], [place] [address] [place], [place] [address]    Report Status PENDING  Incomplete  MRSA PCR Screening     Status: None   Collection Time: 05/14/19   2:41 AM   Specimen: Nasal Mucosa; Nasopharyngeal  Result Value Ref Range Status   MRSA by PCR NEGATIVE NEGATIVE Final    Comment:        The GeneXpert MRSA Assay (FDA approved for NASAL specimens only), is one component of a comprehensive MRSA colonization surveillance program. It is not intended to diagnose MRSA infection nor to guide or monitor treatment for MRSA infections. Performed at [facility], [place] [address] [place], [place] [address]       Radiology Studies: Vas [place] Lower Extremity Venous (dvt)  Result Date: 05/15/2019  Lower Venous Study Indications: Elevated d-dimer.  Limitations: Body habitus, poor ultrasound/tissue interface and Hayden [name] unable to cooperate, poor Hayden [name] position, posterior acoustic shadowing secondary to arterial calcification obscuring view of vein. Comparison Study: No prior study Performing Technologist: Maudry Mayhew MHA, RDMS, RVT, RDCS  Examination Guidelines: A complete evaluation includes B-mode imaging, spectral Doppler, color Doppler, and power Doppler as needed of all accessible portions of each vessel. Bilateral testing is considered an integral part of a complete examination. Limited examinations for reoccurring indications may be performed as noted.  +---------+---------------+---------+-----------+----------+--------------+  RIGHT     Compressibility Phasicity Spontaneity Properties Thrombus  Aging  +---------+---------------+---------+-----------+----------+--------------+  CFV       Full            No        Yes                                    +---------+---------------+---------+-----------+----------+--------------+  SFJ       Full                                                             +---------+---------------+---------+-----------+----------+--------------+  FV Prox   Full                                                             +---------+---------------+---------+-----------+----------+--------------+  FV  Mid    Full                                                             +---------+---------------+---------+-----------+----------+--------------+  FV Distal Full                                                             +---------+---------------+---------+-----------+----------+--------------+  PFV       Full                                                             +---------+---------------+---------+-----------+----------+--------------+  POP                                                        Not visualized  +---------+---------------+---------+-----------+----------+--------------+  PTV       None                      No                     Acute           +---------+---------------+---------+-----------+----------+--------------+  PERO      Full                                                             +---------+---------------+---------+-----------+----------+--------------+  +---------+---------------+---------+-----------+----------+--------------+  LEFT      Compressibility Phasicity Spontaneity Properties Thrombus Aging  +---------+---------------+---------+-----------+----------+--------------+  CFV       Full            No        Yes                                    +---------+---------------+---------+-----------+----------+--------------+  SFJ       Full                                                             +---------+---------------+---------+-----------+----------+--------------+  FV Prox   Full                                                             +---------+---------------+---------+-----------+----------+--------------+  FV Mid    Full                                                             +---------+---------------+---------+-----------+----------+--------------+  FV Distal                 No        Yes                                    +---------+---------------+---------+-----------+----------+--------------+  POP       Full            Yes       Yes                                     +---------+---------------+---------+-----------+----------+--------------+  PTV       Full                                                             +---------+---------------+---------+-----------+----------+--------------+  PERO                                                       Not visualized  +---------+---------------+---------+-----------+----------+--------------+  Summary: Right: Findings consistent with acute deep vein thrombosis involving the right posterior tibial veins. Left: There is no evidence of deep vein thrombosis in the lower extremity. However, portions of this examination were limited- see technologist comments above. No cystic structure found in the popliteal fossa.  Pulsatile venous flow is suggestive of possibly elevated  right heart pressure. *See table(s) above for measurements and observations. Electronically signed by Monica Martinez MD on 05/15/2019 at 10:39:59 AM.    Final        LOS: 2 days   [facility] Pager on [email or web address]  05/16/2019, 10:52 AM

## 2019-05-17 LAB — CBC
HCT: 36.4 % — ABNORMAL LOW (ref 39.0–52.0)
Hemoglobin: 12.3 g/dL — ABNORMAL LOW (ref 13.0–17.0)
MCH: 30.4 pg (ref 26.0–34.0)
MCHC: 33.8 g/dL (ref 30.0–36.0)
MCV: 90.1 fL (ref 80.0–100.0)
Platelets: 284 10*3/uL (ref 150–400)
RBC: 4.04 MIL/uL — ABNORMAL LOW (ref 4.22–5.81)
RDW: 12.5 % (ref 11.5–15.5)
WBC: 15.5 10*3/uL — ABNORMAL HIGH (ref 4.0–10.5)
nRBC: 0 % (ref 0.0–0.2)

## 2019-05-17 LAB — COMPREHENSIVE METABOLIC PANEL
ALT: 43 U/L (ref 0–44)
AST: 31 U/L (ref 15–41)
Albumin: 3.1 g/dL — ABNORMAL LOW (ref 3.5–5.0)
Alkaline Phosphatase: 92 U/L (ref 38–126)
Anion gap: 11 (ref 5–15)
BUN: 24 mg/dL — ABNORMAL HIGH (ref 8–23)
CO2: 22 mmol/L (ref 22–32)
Calcium: 8.6 mg/dL — ABNORMAL LOW (ref 8.9–10.3)
Chloride: 111 mmol/L (ref 98–111)
Creatinine, Ser: 0.71 mg/dL (ref 0.61–1.24)
GFR calc Af Amer: >60 mL/min (ref >60)
GFR calc non Af Amer: >60 mL/min (ref >60)
Glucose, Bld: 185 mg/dL — ABNORMAL HIGH (ref 70–99)
Potassium: 3.7 mmol/L (ref 3.5–5.1)
Sodium: 144 mmol/L (ref 135–145)
Total Bilirubin: 0.9 mg/dL (ref 0.3–1.2)
Total Protein: 6.4 g/dL — ABNORMAL LOW (ref 6.5–8.1)

## 2019-05-17 LAB — GLUCOSE, CAPILLARY
Glucose-Capillary: 201 mg/dL — ABNORMAL HIGH (ref 70–99)
Glucose-Capillary: 219 mg/dL — ABNORMAL HIGH (ref 70–99)
Glucose-Capillary: 131 mg/dL — ABNORMAL HIGH (ref 70–99)
Glucose-Capillary: 322 mg/dL — ABNORMAL HIGH (ref 70–99)
Glucose-Capillary: 304 mg/dL — ABNORMAL HIGH (ref 70–99)
Glucose-Capillary: 294 mg/dL — ABNORMAL HIGH (ref 70–99)
Glucose-Capillary: 257 mg/dL — ABNORMAL HIGH (ref 70–99)

## 2019-05-17 LAB — C-REACTIVE PROTEIN: CRP: 8 mg/dL — ABNORMAL HIGH (ref <1.0)

## 2019-05-17 MED ORDER — DEXAMETHASONE 6 MG PO TABS
6.0000 mg | ORAL_TABLET | Freq: Every day | ORAL | Status: DC
  Administered 2019-05-18 – 2019-05-19 (×2): 6 mg via ORAL
  Filled 2019-05-17 (×2): qty 1

## 2019-05-17 MED ORDER — RIVAROXABAN 20 MG PO TABS: 20.0000 mg | ORAL_TABLET | Freq: Every day | ORAL | Status: DC

## 2019-05-17 MED ORDER — RIVAROXABAN 15 MG PO TABS
15.0000 mg | ORAL_TABLET | Freq: Two times a day (BID) | ORAL | Status: DC
  Administered 2019-05-18 – 2019-05-19 (×3): 15 mg via ORAL
  Filled 2019-05-17 (×5): qty 1

## 2019-05-17 NOTE — Progress Notes (Signed)
PROGRESS NOTE  Hayden [name] [id number] DOB: Mar 28, 1941 DOA: 05/13/2019  PCP: Patient, No Pcp Per  Brief History/Interval Summary: 78 year old Caucasian male who is a resident of [facility] who has a history of dementia, leukemia, diabetes mellitus, hyperlipidemia GERD and fibromyalgia was reportedly diagnosed with COVID-19 about 2 days prior to this admission.  He did not have any symptoms.  For unclear reason patient was sent over to the emergency department.  Evaluation in the emergency department did reveal a elevated CRP of 25.7 lactic acid of 1.9 procalcitonin less than 0.1.  Chest x-ray showed patchy density in the right lung base suspicious for pneumonia.  This was from 11/11.  Chest x-ray from day of admission showed improvement in this patchy opacity.  While he was in the emergency department he had projectile vomiting of brown emesis.  He aspirated.  He spiked a fever.  He became hypoxic.  He was stabilized and then transferred to this hospital for further evaluation and management.  Reason for Visit: Aspiration pneumonia.  Pneumonia due to COVID-19.  Pulmonary embolism  Consultants: None  Procedures:   Transthoracic echocardiogram 11/14  1. Left ventricular ejection fraction, by visual estimation, is 55 to 60%, although assessment is extremelty limited due to poor visualization. Would recommend contrast enhancement in the future.  2. Global right ventricle was not well visualized.The right ventricular size is not well visualized. Right vetricular wall thickness was not assessed.  3. Left atrial size was not well visualized.  4. Right atrial size was not well visualized.  5. Mild mitral annular calcification.  6. The mitral valve was not well visualized. unable to visualize mitral valve regurgitation.  7. The tricuspid valve is not well visualized. Tricuspid valve regurgitation unable to visualize.  8. Aortic valve regurgitation unable to visualize.  9. The aortic valve  was not well visualized. Aortic valve regurgitation unable to visualize. unable to visualize. 10. The pulmonic valve was not well visualized. Pulmonic valve regurgitation is moderate. 11. The interatrial septum was not well visualized.   Antibiotics: Anti-infectives (From admission, onward)   Start     Dose/Rate Route Frequency Ordered Stop   05/15/19 1000  remdesivir 100 mg in sodium chloride 0.9 % 250 mL IVPB     100 mg 500 mL/hr over 30 Minutes Intravenous Every 24 hours 05/14/19 1002 05/19/19 0959   05/14/19 1100  remdesivir 200 mg in sodium chloride 0.9 % 250 mL IVPB     200 mg 500 mL/hr over 30 Minutes Intravenous Once 05/14/19 1002 05/14/19 1340   05/14/19 0011  Ampicillin-Sulbactam (UNASYN) 3 g in sodium chloride 0.9 % 100 mL IVPB  Status:  Discontinued     3 g 200 mL/hr over 30 Minutes Intravenous Every 8 hours 05/14/19 0011 05/14/19 0018   05/13/19 2200  Ampicillin-Sulbactam (UNASYN) 3 g in sodium chloride 0.9 % 100 mL IVPB     3 g 200 mL/hr over 30 Minutes Intravenous Every 6 hours 05/13/19 1827 05/18/19 2159   05/13/19 1530  Ampicillin-Sulbactam (UNASYN) 3 g in sodium chloride 0.9 % 100 mL IVPB     3 g 200 mL/hr over 30 Minutes Intravenous  Once 05/13/19 1529 05/13/19 1823      Subjective/Interval History: Patient remains confused.  Noted to be crying this morning.  Was not able to tell me why he was crying and upset.  He was reassured.  Denies any pain.     Assessment/Plan:  Acute Hypoxic Resp. Failure/Pneumonia due to COVID-19/aspiration pneumonia  COVID-19 Labs  Recent Labs    05/15/19 0035 05/16/19 0226 05/17/19 0510  CRP 20.0* 14.3* 8.0*    Patient's COVID-19 test report is imported to epic under 'Media' in chart review.  This was done at [facility] on 04/30/2019.  Fever: Remains afebrile Oxygen requirements: Saturating in the 90s on room air. Antibacterials: Unasyn day 4 out of 5 Remdesivir: Day 4 Steroids: Dexamethasone 6 mg every  12 hours Diuretics: Not on diuretics on a scheduled basis Actemra: No Actemra due to possibility of superimposed bacterial infection DVT Prophylaxis: Currently on Lovenox for PE  Patient's respiratory status has improved.  He saturating normal on room air.  Inflammatory markers have improved.  CRP was elevated at 25.7 and has improved to 8.0 today.  He remains on remdesivir and steroids.  He will complete 5 days of remdesivir tomorrow.  He is also on Unasyn due to concerns for aspiration pneumonia.  He will complete course of that tomorrow as well.  Liver function tests unremarkable.  Leukocytosis is due to steroids.  Cut back on steroids now.  Will change dexamethasone to once a day.  Change to oral.  Acute pulmonary embolism and acute right lower extremity DVT D-Dimer was noted to be greater than 20.  CT angiogram shows bilateral pulmonary embolism.  No evidence for saddle emboli.  No right heart strain.  Patient is stable.  Echocardiogram as above.  Normal EF was noted.  Plan was to transition him to an oral anticoagulant however due to the fact that patient was on carbamazepine this could not be done.  He was transitioned to Lovenox instead.   I have discussed carbamazepine with the patient's provider at [facility], Ms. Stormy Fabian, PA.  He was started on this primarily for behavioral issues and they are okay with stopping this medication if needed.  Patient without any history of seizure disorder.  Discussed with pharmacy yesterday.  Okay to stop carbamazepine abruptly and those were taking it for reasons other than seizure disorder.,  Was the pain was discontinued yesterday, 11/16.  Will be able to start patient on rivaroxaban tomorrow which is 11/18.    History of dementia Continue home medications.  He is noted to be on Aricept.  Patient on carbamazepine mainly for behavioral issues.  He is also noted to be on Risperdal and lorazepam as needed at the skilled nursing facility.  Carbamazepine  discontinued as discussed above.  Diabetes mellitus type 2 with episodes of hypoglycemia and hyperglycemia Elevated CBGs due to steroids.  HbA1c 6.9.  Patient on SSI as well as Levemir.  Dose of Levemir was decreased due to hypoglycemia.  We will cut back on steroids today.  Continue to monitor.    Elevated blood pressure Unclear if he has a history of essential hypertension or not.  Home medication list does not show any blood pressure lowering agents.  He has been on hydralazine as needed.  Some of his elevated blood pressure likely due to anxiety.  Steroid could also be contributing.  Patient started on amlodipine.  Continue to monitor.  Hypokalemia Repleted  Normocytic anemia Stable hemoglobin.  Continue to monitor.  No evidence of overt blood loss.  History of BPH Continue with Flomax.   DVT Prophylaxis: On full dose Lovenox currently.  Transition to rivaroxaban tomorrow. PUD Prophylaxis: PPI Code Status: DNR Family Communication: Sister being updated on a daily basis Disposition Plan: Plan is for him to return to his skilled nursing facility hopefully on 11/18.  Medications:  Scheduled: . amLODipine  10 mg Oral Daily  . aspirin  81 mg Oral Daily  . celecoxib  200 mg Oral BID  . dexamethasone (DECADRON) injection  6 mg Intravenous Q12H  . donepezil  10 mg Oral QHS  . enoxaparin (LOVENOX) injection  1.5 mg/kg Subcutaneous Q24H  . feeding supplement (GLUCERNA SHAKE)  237 mL Oral TID BM  . insulin aspart  0-9 Units Subcutaneous Q4H  . insulin detemir  8 Units Subcutaneous QHS  . pantoprazole  40 mg Oral Daily  . senna  1 tablet Oral BID  . sertraline  50 mg Oral Daily  . sodium chloride flush  3 mL Intravenous Q12H  . tamsulosin  0.4 mg Oral Daily   Continuous: . ampicillin-sulbactam (UNASYN) IV 3 g (05/17/19 0908)  . remdesivir 100 mg in NS 250 mL 100 mg (05/17/19 0925)   [id number] **OR** acetaminophen, hydrALAZINE, magnesium hydroxide, ondansetron **OR**  ondansetron (ZOFRAN) IV, oxyCODONE, Resource ThickenUp Clear, risperiDONE, sodium phosphate, sorbitol, traZODone   Objective:  Vital Signs  Vitals:   05/17/19 0605 05/17/19 0607 05/17/19 0615 05/17/19 0904  BP:    (!) 144/88  Pulse: 74 89 82   Resp: 16 (!) 26 18   Temp:      TempSrc:      SpO2: (!) 84% 100% 99%   Weight:      Height:        Intake/Output Summary (Last 24 hours) at 05/17/2019 1042 Last data filed at 05/17/2019 0600 Gross per 24 hour  Intake 881.63 ml  Output 450 ml  Net 431.63 ml   Filed Weights   05/14/19 0026  Weight: 63 kg    General appearance: Awake alert.  In no distress.  Noted to be upset this morning.  Remains distracted. Resp: Coarse breath sounds bilaterally.  Crackles at the bases.  No wheezing or rhonchi.  Normal effort. Cardio: S1-S2 is normal regular.  No S3-S4.  No rubs murmurs or bruit GI: Abdomen is soft.  Nontender nondistended.  Bowel sounds are present normal.  No masses organomegaly Extremities: Able to move all his extremities.  Some deconditioning noted. Neurologic: Disoriented.  No obvious focal neurological deficits.    Lab Results:  Data Reviewed: I have personally reviewed following labs and imaging studies  CBC: Recent Labs  Lab 05/11/19 1203  05/13/19 1519 05/14/19 0326 05/15/19 0035 05/16/19 0226 05/17/19 0510  WBC 9.3  --  10.7* 9.5 10.1 13.5* 15.5*  NEUTROABS 5.7  --  6.8  --   --   --   --   HGB 11.9*   < > 13.3 12.0* 11.9* 12.4* 12.3*  HCT 36.0*   < > 39.8 36.2* 35.6* 36.4* 36.4*  MCV 93.8  --  92.3 92.3 92.0 90.1 90.1  PLT 177  --  195 184 205 270 284   < > = values in this interval not displayed.    Basic Metabolic Panel: Recent Labs  Lab 05/13/19 1519 05/14/19 0326 05/15/19 0035 05/16/19 0226 05/17/19 0510  NA 141 143 143 144 144  K 3.6 3.1* 4.0 4.0 3.7  CL 105 108 110 109 111  CO2 24 25 24 25 22   GLUCOSE 168* 138* 287* 189* 185*  BUN 25* 28* 25* 22 24*  CREATININE 0.67 0.63 0.61 0.69  0.71  CALCIUM 8.9 8.4* 8.3* 8.6* 8.6*  MG  --   --  1.9  --   --     GFR: Estimated Creatinine Clearance: 66.7 mL/min (  by C-G formula based on SCr of 0.71 mg/dL).  Liver Function Tests: Recent Labs  Lab 05/11/19 1203 05/13/19 1519 05/15/19 0035 05/16/19 0226 05/17/19 0510  AST 41 33 71* 34 31  ALT 26 34 57* 50* 43  ALKPHOS 85 98 86 93 92  BILITOT 0.9 0.7 0.7 0.7 0.9  PROT 6.0* 6.6 5.7* 6.4* 6.4*  ALBUMIN 3.5 3.4* 2.8* 3.2* 3.1*     Coagulation Profile: Recent Labs  Lab 05/11/19 1203  INR 1.2    CBG: Recent Labs  Lab 05/16/19 1603 05/16/19 2101 05/17/19 0035 05/17/19 0530 05/17/19 0815  GLUCAP 297* 196* 219* 201* 131*     Recent Results (from the past 240 hour(s))  Blood Culture (routine x 2)     Status: None (Preliminary result)   Collection Time: 05/13/19  3:19 PM   Specimen: BLOOD LEFT ARM  Result Value Ref Range Status   Specimen Description   Final    BLOOD LEFT ARM Performed at [facility], [place] [address], [place], [place] [address]    Special Requests   Final    BOTTLES DRAWN AEROBIC ONLY Blood Culture adequate volume Performed at [facility], [address], [place], [place] [address]    Culture   Final    NO GROWTH 4 DAYS Performed at [facility], [place] [address], [place], [place] [address]    Report Status PENDING  Incomplete  Blood Culture (routine x 2)     Status: None (Preliminary result)   Collection Time: 05/13/19  3:19 PM   Specimen: BLOOD RIGHT HAND  Result Value Ref Range Status   Specimen Description   Final    BLOOD RIGHT HAND Performed at [facility], [place] [address] [place], [place] [address]    Special Requests   Final    BOTTLES DRAWN AEROBIC ONLY Blood Culture adequate volume Performed at [facility], [address], [place], [place] [address]    Culture   Final    NO GROWTH 4 DAYS Performed at [facility], [place] [address], [place], [place] [address]    Report Status PENDING  Incomplete  MRSA PCR Screening     Status: None   Collection Time: 05/14/19  2:41 AM   Specimen: Nasal Mucosa; Nasopharyngeal  Result Value Ref Range Status   MRSA by PCR NEGATIVE NEGATIVE Final    Comment:        The GeneXpert MRSA Assay (FDA approved for NASAL specimens only), is one component of a comprehensive MRSA colonization surveillance program. It is not intended to diagnose MRSA infection nor to guide or monitor treatment for MRSA infections. Performed at [facility], [place] [address] [place], [place] [address]       Radiology Studies: No results found.     LOS: 3 days   Berman Grainger [employer] on [email or web address]  05/17/2019, 10:42 AM

## 2019-05-17 NOTE — Progress Notes (Signed)
Results for BALLARD, THIBODEAUX (MRN [id number]) as of 05/17/2019 14:56  Ref. Range 05/16/2019 21:01 05/17/2019 00:35 05/17/2019 05:30 05/17/2019 08:15 05/17/2019 12:30  Glucose-Capillary Latest Ref Range: 70 - 99 mg/dL 196 (H) 219 (H) 201 (H) 131 (H) 304 (H)  Noted that postprandial CBGs are greater than 180 mg/dl.  Recommend increasing Novolog correction scale to MODERATE TID & HS since patient is eating and adding Novolog 3 units TID with meals and nutritional supplements.   Harvel Ricks RN BSN CDE Diabetes Coordinator Pager: [phone]  8am-5pm

## 2019-05-17 NOTE — Plan of Care (Signed)
LM, w follow up phone number, on voicemail for brother. Glad to update once they call back.

## 2019-05-17 NOTE — Progress Notes (Signed)
[facility] for Heparin>Lovenox>Xarelto Indication: pulmonary embolus  Allergies  Allergen Reactions  . Bee Venom Other (See Comments)    Bump where stung and took days to go away  . Glucophage [Metformin] Diarrhea    "I will never take it again - stool incontinent"  . Shellfish Allergy Swelling  . Grass Extracts [Gramineae Pollens] Other (See Comments)    Unknown rxn  . Tobacco [Tobacco] Other (See Comments)    Unknown rxn    Patient Measurements: Height: 5' 5.98" (167.6 cm) Weight: 138 lb 14.2 oz (63 kg) IBW/kg (Calculated) : 63.76  Vital Signs: Temp: 98.2 F (36.8 C) (11/17 0532) BP: 144/88 (11/17 0904) Pulse Rate: 82 (11/17 0615)  Labs: Recent Labs    05/15/19 0035 05/15/19 0745 05/16/19 0226 05/17/19 0510  HGB 11.9*  --  12.4* 12.3*  HCT 35.6*  --  36.4* 36.4*  PLT 205  --  270 284  HEPARINUNFRC 0.51 0.58  --   --   CREATININE 0.61  --  0.69 0.71    Estimated Creatinine Clearance: 66.7 mL/min (by C-G formula based on SCr of 0.71 mg/dL).   Medical History: Past Medical History:  Diagnosis Date  . Candidiasis   . Depression   . Diabetes mellitus   . Enlarged prostate   . Fibromyalgia   . GERD (gastroesophageal reflux disease)    reflux intermittent  . Hyperlipidemia   . 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 amLODipine  10 mg Oral Daily  . aspirin  81 mg Oral Daily  . celecoxib  200 mg Oral BID  . [START ON 05/18/2019] dexamethasone  6 mg Oral Daily  . donepezil  10 mg Oral QHS  . enoxaparin (LOVENOX) injection  1.5 mg/kg Subcutaneous Q24H  . feeding supplement (GLUCERNA SHAKE)  237 mL Oral TID BM  . insulin aspart  0-9 Units Subcutaneous Q4H  . insulin detemir  8 Units Subcutaneous QHS  . pantoprazole  40 mg Oral Daily  . [START ON 05/18/2019] Rivaroxaban  15 mg  Oral BID WC  . [START ON 06/08/2019] rivaroxaban  20 mg Oral Q supper  . senna  1 tablet Oral BID  . sertraline  50 mg Oral Daily  . sodium chloride flush  3 mL Intravenous Q12H  . tamsulosin  0.4 mg Oral Daily    Assessment: 78yoM admitted with COVID-19 found to have acute PE. No anticoagulation noted PTA,   Plan was to convert heparin to Xarelto but he is on carbamazepine which will interact with all of the NOACs. Carbamazepine d/c 11/16 with plans to initiate Xarelto 11/18 allowing for some washout period.   CBC stable, SCr stable, no bleeding reported  Goal of Therapy:  4-HR Anti-Xa 1-2 Monitor platelets by anticoagulation protocol: Yes   Plan:  Dc Lovenox after 11/17 dose Initiate Xarelto 15 mg bid x 21 days followed by 20 mg daily thereafter F/u CBC, renal function, s/s bleeding Xarelto education  Ulice Dash, PharmD, BCPS Clinical Pharmacist  05/17/2019 11:47 AM

## 2019-05-17 NOTE — Progress Notes (Signed)
  Speech Language Pathology Treatment: Dysphagia  Patient Details Name: Hayden [name] MRN: [id number] DOB: 1940/11/05 Today's Date: 05/17/2019 Time: [id number] SLP Time Calculation (min) (ACUTE ONLY): 29 min  Assessment / Plan / Recommendation Clinical Impression  Pt with limited participation this morning.  Repositioned for optimal eating/self-feeding.  Oral care provided, with pt able to brush his teeth with physical assist.  Pt grimacing throughout session, with intermittent tachypnea to mid 30s, He was intermittently willing to eat some items from breakfast tray (pureed pineapple and thickened shake), with adequate oral attention and no overt s/s of aspiration, but he adamantly refused eggs/potatoes and ultimately only drank a few ounces of his shake.  Provided encouragement and facilitated self-feeding when able - pt demonstrates initiation of tasks but then has difficulty persisting through, stopping with utensil poised in air and becoming resistant to attempts to carry-through.  He verbalized Yes/no occasionally.  Pt left with bed alarm engaged and HOB elevated so that he could finish his shake if he desired.  D/W RN.   HPI HPI: Patient is a 78 y.o. male a resident of [facility] with history of dementia, leukemia, diabetes mellitus, hyperlipidemia, GERD, fibromyalgia. Patient was reportedly diagnosed positive for Covid 2 days ago at the facility.  But he did not have any symptoms.  For some unknown reason, the supervisor at the facility called EMS and sent the patient to the ED. While in the ED, patient had projectile vomiting of brown emesis.  RN immediately suctioned patient's mouth but possible aspiration was noted.  He is spiked a fever of 103.3, became tachycardic up to 120 and required up to 8 L of oxygen by nasal cannula. CXR shows Mild bilateral lower lobe hazy lung opacities compatible with the clinical history of COVID-19 positivity. Pt then found to have had a large PE on CTA and put  on heparin. Later, pt was given a sip of water by RN and had some coughing. Pt made NPO.       SLP Plan  Continue with current plan of care       Recommendations  Diet recommendations: Dysphagia 2 (fine chop);Nectar-thick liquid Liquids provided via: Cup;Straw Medication Administration: Whole meds with puree Supervision: Intermittent supervision to cue for compensatory strategies Compensations: Slow rate;Small sips/bites Postural Changes and/or Swallow Maneuvers: Seated upright 90 degrees                Oral Care Recommendations: Oral care BID Follow up Recommendations: Skilled Nursing facility SLP Visit Diagnosis: Dysphagia, oropharyngeal phase (R13.12) Plan: Continue with current plan of care       GO              Valree Feild L. Tivis Ringer, [place] CCC/SLP Acute Rehabilitation Services Office number [phone] Communicate via [place], Jujhar Everett Laurice 05/17/2019, 10:02 AM

## 2019-05-18 LAB — COMPREHENSIVE METABOLIC PANEL
ALT: 45 U/L — ABNORMAL HIGH (ref 0–44)
AST: 32 U/L (ref 15–41)
Albumin: 3 g/dL — ABNORMAL LOW (ref 3.5–5.0)
Alkaline Phosphatase: 100 U/L (ref 38–126)
Anion gap: 11 (ref 5–15)
BUN: 28 mg/dL — ABNORMAL HIGH (ref 8–23)
CO2: 20 mmol/L — ABNORMAL LOW (ref 22–32)
Calcium: 8.8 mg/dL — ABNORMAL LOW (ref 8.9–10.3)
Chloride: 112 mmol/L — ABNORMAL HIGH (ref 98–111)
Creatinine, Ser: 0.76 mg/dL (ref 0.61–1.24)
GFR calc Af Amer: >60 mL/min (ref >60)
GFR calc non Af Amer: >60 mL/min (ref >60)
Glucose, Bld: 265 mg/dL — ABNORMAL HIGH (ref 70–99)
Potassium: 3.6 mmol/L (ref 3.5–5.1)
Sodium: 143 mmol/L (ref 135–145)
Total Bilirubin: 0.5 mg/dL (ref 0.3–1.2)
Total Protein: 6.6 g/dL (ref 6.5–8.1)

## 2019-05-18 LAB — CBC
HCT: 39 % (ref 39.0–52.0)
Hemoglobin: 13.2 g/dL (ref 13.0–17.0)
MCH: 30.7 pg (ref 26.0–34.0)
MCHC: 33.8 g/dL (ref 30.0–36.0)
MCV: 90.7 fL (ref 80.0–100.0)
Platelets: 328 10*3/uL (ref 150–400)
RBC: 4.3 MIL/uL (ref 4.22–5.81)
RDW: 12.9 % (ref 11.5–15.5)
WBC: 18.6 10*3/uL — ABNORMAL HIGH (ref 4.0–10.5)
nRBC: 0 % (ref 0.0–0.2)

## 2019-05-18 LAB — CULTURE, BLOOD (ROUTINE X 2)
Culture: NO GROWTH
Culture: NO GROWTH
Special Requests: ADEQUATE
Special Requests: ADEQUATE

## 2019-05-18 LAB — GLUCOSE, CAPILLARY
Glucose-Capillary: 236 mg/dL — ABNORMAL HIGH (ref 70–99)
Glucose-Capillary: 130 mg/dL — ABNORMAL HIGH (ref 70–99)
Glucose-Capillary: 168 mg/dL — ABNORMAL HIGH (ref 70–99)
Glucose-Capillary: 286 mg/dL — ABNORMAL HIGH (ref 70–99)
Glucose-Capillary: 150 mg/dL — ABNORMAL HIGH (ref 70–99)

## 2019-05-18 LAB — C-REACTIVE PROTEIN: CRP: 4.7 mg/dL — ABNORMAL HIGH (ref <1.0)

## 2019-05-18 MED ORDER — INSULIN ASPART 100 UNIT/ML ~~LOC~~ SOLN
0.0000 [IU] | Freq: Three times a day (TID) | SUBCUTANEOUS | Status: DC
  Administered 2019-05-18: 5 [IU] via SUBCUTANEOUS
  Administered 2019-05-19: 3 [IU] via SUBCUTANEOUS

## 2019-05-18 MED ORDER — INSULIN ASPART 100 UNIT/ML ~~LOC~~ SOLN
3.0000 [IU] | Freq: Three times a day (TID) | SUBCUTANEOUS | Status: DC
  Administered 2019-05-18 – 2019-05-19 (×2): 3 [IU] via SUBCUTANEOUS

## 2019-05-18 NOTE — Plan of Care (Signed)
Pt Education, needs reinforcement. Pt stable throughout shift. Continuing to monitor. Safety maintained

## 2019-05-18 NOTE — Discharge Instructions (Signed)

## 2019-05-18 NOTE — Progress Notes (Signed)
PROGRESS NOTE  Hayden [name] [id number] DOB: 1940-10-10 DOA: 05/13/2019  PCP: Patient, No Pcp Per  Brief History/Interval Summary: 78 year old Caucasian male who is a resident of [facility] who has a history of dementia, leukemia, diabetes mellitus, hyperlipidemia GERD and fibromyalgia was reportedly diagnosed with COVID-19 about 2 days prior to this admission.  He did not have any symptoms.  For unclear reason patient was sent over to the emergency department.  Evaluation in the emergency department did reveal a elevated CRP of 25.7 lactic acid of 1.9 procalcitonin less than 0.1.  Chest x-ray showed patchy density in the right lung base suspicious for pneumonia.  This was from 11/11.  Chest x-ray from day of admission showed improvement in this patchy opacity.  While he was in the emergency department he had projectile vomiting of brown emesis.  He aspirated.  He spiked a fever.  He became hypoxic.  He was stabilized and then transferred to this hospital for further evaluation and management.  Reason for Visit: Aspiration pneumonia.  Pneumonia due to COVID-19.  Pulmonary embolism  Consultants: None  Procedures:   Transthoracic echocardiogram 11/14  1. Left ventricular ejection fraction, by visual estimation, is 55 to 60%, although assessment is extremelty limited due to poor visualization. Would recommend contrast enhancement in the future.  2. Global right ventricle was not well visualized.The right ventricular size is not well visualized. Right vetricular wall thickness was not assessed.  3. Left atrial size was not well visualized.  4. Right atrial size was not well visualized.  5. Mild mitral annular calcification.  6. The mitral valve was not well visualized. unable to visualize mitral valve regurgitation.  7. The tricuspid valve is not well visualized. Tricuspid valve regurgitation unable to visualize.  8. Aortic valve regurgitation unable to visualize.  9. The aortic  valve was not well visualized. Aortic valve regurgitation unable to visualize. unable to visualize. 10. The pulmonic valve was not well visualized. Pulmonic valve regurgitation is moderate. 11. The interatrial septum was not well visualized.   Antibiotics: Anti-infectives (From admission, onward)   Start     Dose/Rate Route Frequency Ordered Stop   05/15/19 1000  remdesivir 100 mg in sodium chloride 0.9 % 250 mL IVPB     100 mg 500 mL/hr over 30 Minutes Intravenous Every 24 hours 05/14/19 1002 05/18/19 1128   05/14/19 1100  remdesivir 200 mg in sodium chloride 0.9 % 250 mL IVPB     200 mg 500 mL/hr over 30 Minutes Intravenous Once 05/14/19 1002 05/14/19 1340   05/14/19 0011  Ampicillin-Sulbactam (UNASYN) 3 g in sodium chloride 0.9 % 100 mL IVPB  Status:  Discontinued     3 g 200 mL/hr over 30 Minutes Intravenous Every 8 hours 05/14/19 0011 05/14/19 0018   05/13/19 2200  Ampicillin-Sulbactam (UNASYN) 3 g in sodium chloride 0.9 % 100 mL IVPB     3 g 200 mL/hr over 30 Minutes Intravenous Every 6 hours 05/13/19 1827 05/18/19 2159   05/13/19 1530  Ampicillin-Sulbactam (UNASYN) 3 g in sodium chloride 0.9 % 100 mL IVPB     3 g 200 mL/hr over 30 Minutes Intravenous  Once 05/13/19 1529 05/13/19 1823      Subjective/Interval History: Patient still cannot provide any complaints, as discussed with staff, no significant events, he finished 50% of his breakfast, took all of his meds .  Assessment/Plan:  Acute Hypoxic Resp. Failure/Pneumonia due to COVID-19/aspiration pneumonia  COVID-19 Labs  Recent Labs  05/16/19 0226 05/17/19 0510 05/18/19 0400  CRP 14.3* 8.0* 4.7*    Patient's COVID-19 test report is imported to epic under 'Media' in chart review.  This was done at [facility] on 04/30/2019.  Fever: Remains afebrile Oxygen requirements: Saturating in the 90s on room air. Antibacterials: Unasyn X 5 days. Remdesivir: Day 5 Steroids: Dexamethasone 6 mg every 12  hours Diuretics: Not on diuretics on a scheduled basis Actemra: No Actemra due to possibility of superimposed bacterial infection DVT Prophylaxis: on Lovenox >> Xarelto for PE  Patient's respiratory status has improved.  He saturating normal on room air.  Inflammatory markers have improved.  CRP initially elevated at 25.7, significantly improved to 4.7 today, he has no estrogen requirement, remains on steroids, today will finish fifth dose of remdesivir He is also on Unasyn due to concerns for aspiration pneumonia.  He will complete course of that tomorrow as well.  Liver function tests unremarkable.  Leukocytosis is due to steroids.  Cut back on steroids now.  Will change dexamethasone to once a day.  Change to oral.  Acute pulmonary embolism and acute right lower extremity DVT D-Dimer was noted to be greater than 20.  CT angiogram shows bilateral pulmonary embolism.  No evidence for saddle emboli.  No right heart strain.  Patient is stable.  Echocardiogram as above.  Normal EF was noted.  Plan was to transition him to an oral anticoagulant however due to the fact that patient was on carbamazepine this could not be done.  He was transitioned to Lovenox instead.   I have discussed carbamazepine with the patient's provider at [facility], Ms. Stormy Fabian, PA.  He was started on this primarily for behavioral issues and they are okay with stopping this medication if needed.  Patient without any history of seizure disorder.  Discussed with pharmacy yesterday.  Okay to stop carbamazepine abruptly and those were taking it for reasons other than seizure disorder.  Permissive Pinn was stopped 11/16, day he was transitioned from Lovenox to Xarelto.  History of dementia Continue home medications.  He is noted to be on Aricept.  Patient on carbamazepine mainly for behavioral issues.  He is also noted to be on Risperdal and lorazepam as needed at the skilled nursing facility.  Carbamazepine discontinued as  discussed above.  Diabetes mellitus type 2 with episodes of hypoglycemia and hyperglycemia Elevated CBGs due to steroids.  HbA1c 6.9.  Patient on SSI as well as Levemir.  Dose of Levemir was decreased due to hypoglycemia.  We will cut back on steroids today.  Continue to monitor.    Elevated blood pressure Unclear if he has a history of essential hypertension or not.  Home medication list does not show any blood pressure lowering agents.  He has been on hydralazine as needed.  Some of his elevated blood pressure likely due to anxiety.  Steroid could also be contributing.  Patient started on amlodipine.  Continue to monitor.  Hypokalemia Repleted  Normocytic anemia Stable hemoglobin.  Continue to monitor.  No evidence of overt blood loss.  History of BPH Continue with Flomax.   DVT Prophylaxis: On full dose Lovenox currently.  Transition to rivaroxaban tomorrow. PUD Prophylaxis: PPI Code Status: DNR Family Communication: Sister being updated on a daily basis Disposition Plan: Plan is for him to return to his skilled nursing facility    Medications:  Scheduled: . amLODipine  10 mg Oral Daily  . dexamethasone  6 mg Oral Daily  . donepezil  10 mg Oral QHS  . feeding supplement (GLUCERNA SHAKE)  237 mL Oral TID BM  . insulin aspart  0-9 Units Subcutaneous Q4H  . insulin detemir  8 Units Subcutaneous QHS  . pantoprazole  40 mg Oral Daily  . Rivaroxaban  15 mg Oral BID WC  . [START ON 06/08/2019] rivaroxaban  20 mg Oral Q supper  . senna  1 tablet Oral BID  . sertraline  50 mg Oral Daily  . sodium chloride flush  3 mL Intravenous Q12H  . tamsulosin  0.4 mg Oral Daily   Continuous: . ampicillin-sulbactam (UNASYN) IV 3 g (05/18/19 1045)   [id number] **OR** acetaminophen, hydrALAZINE, magnesium hydroxide, ondansetron **OR** ondansetron (ZOFRAN) IV, oxyCODONE, Resource ThickenUp Clear, risperiDONE, sodium phosphate, sorbitol, traZODone   Objective:  Vital Signs  Vitals:    05/17/19 0904 05/17/19 2016 05/18/19 0447 05/18/19 0800  BP: (!) 144/88 (!) 153/71 (!) 159/81 (!) 147/81  Pulse:  96 87 97  Resp:  20 15 17   Temp:  99 F (37.2 C) 98.7 F (37.1 C) 97.8 F (36.6 C)  TempSrc:  Oral  Oral  SpO2:  100% 100% 100%  Weight:      Height:        Intake/Output Summary (Last 24 hours) at 05/18/2019 1358 Last data filed at 05/18/2019 0800 Gross per 24 hour  Intake 632.02 ml  Output 450 ml  Net 182.02 ml   Filed Weights   05/14/19 0026  Weight: 63 kg    Patient is awake, alert, in no apparent distress , distracted, appears to be upset this morning .  aphasic. Coarse respiratory sounds bilaterally, but good air entry, with no wheezing or rhonchi, no use of accessory muscles  Regular rate and rhythm, no rubs murmurs or gallops  [place] soft, nontender, nondistended  Extremities with no edema, clubbing or cyanosis    Lab Results:  Data Reviewed: I have personally reviewed following labs and imaging studies  CBC: Recent Labs  Lab 05/13/19 1519 05/14/19 0326 05/15/19 0035 05/16/19 0226 05/17/19 0510 05/18/19 0400  WBC 10.7* 9.5 10.1 13.5* 15.5* 18.6*  NEUTROABS 6.8  --   --   --   --   --   HGB 13.3 12.0* 11.9* 12.4* 12.3* 13.2  HCT 39.8 36.2* 35.6* 36.4* 36.4* 39.0  MCV 92.3 92.3 92.0 90.1 90.1 90.7  PLT 195 184 205 270 284 [id number]    Basic Metabolic Panel: Recent Labs  Lab 05/14/19 0326 05/15/19 0035 05/16/19 0226 05/17/19 0510 05/18/19 0400  NA 143 143 144 144 143  K 3.1* 4.0 4.0 3.7 3.6  CL 108 110 109 111 112*  CO2 25 24 25 22  20*  GLUCOSE 138* 287* 189* 185* 265*  BUN 28* 25* 22 24* 28*  CREATININE 0.63 0.61 0.69 0.71 0.76  CALCIUM 8.4* 8.3* 8.6* 8.6* 8.8*  MG  --  1.9  --   --   --     GFR: Estimated Creatinine Clearance: 66.7 mL/min (by C-G formula based on SCr of 0.76 mg/dL).  Liver Function Tests: Recent Labs  Lab 05/13/19 1519 05/15/19 0035 05/16/19 0226 05/17/19 0510 05/18/19 0400  AST 33 71* 34 31 32  ALT  34 57* 50* 43 45*  ALKPHOS 98 86 93 92 100  BILITOT 0.7 0.7 0.7 0.9 0.5  PROT 6.6 5.7* 6.4* 6.4* 6.6  ALBUMIN 3.4* 2.8* 3.2* 3.1* 3.0*     Coagulation Profile: No results for input(s): INR, PROTIME in the last 168 hours.  CBG:  Recent Labs  Lab 05/17/19 1604 05/17/19 2308 05/18/19 0517 05/18/19 1027 05/18/19 1108  GLUCAP 294* 257* 236* 130* 168*     Recent Results (from the past 240 hour(s))  Blood Culture (routine x 2)     Status: None   Collection Time: 05/13/19  3:19 PM   Specimen: BLOOD LEFT ARM  Result Value Ref Range Status   Specimen Description   Final    BLOOD LEFT ARM Performed at [facility], [place] [address] [place], [place] [address]    Special Requests   Final    BOTTLES DRAWN AEROBIC ONLY Blood Culture adequate volume Performed at [facility], [address], [place], [place] [address]    Culture   Final    NO GROWTH 5 DAYS Performed at [facility], [place] [address], [place], [place] [address]    Report Status 05/18/2019 FINAL  Final  Blood Culture (routine x 2)     Status: None   Collection Time: 05/13/19  3:19 PM   Specimen: BLOOD RIGHT HAND  Result Value Ref Range Status   Specimen Description   Final    BLOOD RIGHT HAND Performed at [facility], [place] [address], [place], [place] [address]    Special Requests   Final    BOTTLES DRAWN AEROBIC ONLY Blood Culture adequate volume Performed at [facility], [address], [place], [place] [address]    Culture   Final    NO GROWTH 5 DAYS Performed at [facility], [place] [address], [place], [place] [address]    Report Status 05/18/2019 FINAL  Final  MRSA PCR Screening     Status: None   Collection Time: 05/14/19  2:41 AM   Specimen: Nasal Mucosa; Nasopharyngeal  Result Value Ref Range Status   MRSA by PCR NEGATIVE NEGATIVE Final    Comment:        The GeneXpert MRSA Assay (FDA  approved for NASAL specimens only), is one component of a comprehensive MRSA colonization surveillance program. It is not intended to diagnose MRSA infection nor to guide or monitor treatment for MRSA infections. Performed at [facility], [place] [address] [place], [place] [address]       Radiology Studies: No results found.     LOS: 4 days   Phillips Climes MD  [facility] Pager on [email or web address]  05/18/2019, 1:58 PM

## 2019-05-18 NOTE — Plan of Care (Signed)
  Problem: Coping: Goal: Psychosocial and spiritual needs will be supported Outcome: Progressing   Problem: Education: Goal: Knowledge of risk factors and measures for prevention of condition will improve Outcome: Not Progressing

## 2019-05-19 DIAGNOSIS — Z515 Encounter for palliative care: Secondary | ICD-10-CM

## 2019-05-19 LAB — COMPREHENSIVE METABOLIC PANEL
ALT: 47 U/L — ABNORMAL HIGH (ref 0–44)
AST: 36 U/L (ref 15–41)
Albumin: 3.1 g/dL — ABNORMAL LOW (ref 3.5–5.0)
Alkaline Phosphatase: 102 U/L (ref 38–126)
Anion gap: 13 (ref 5–15)
BUN: 28 mg/dL — ABNORMAL HIGH (ref 8–23)
CO2: 19 mmol/L — ABNORMAL LOW (ref 22–32)
Calcium: 8.5 mg/dL — ABNORMAL LOW (ref 8.9–10.3)
Chloride: 109 mmol/L (ref 98–111)
Creatinine, Ser: 0.72 mg/dL (ref 0.61–1.24)
GFR calc Af Amer: >60 mL/min (ref >60)
GFR calc non Af Amer: >60 mL/min (ref >60)
Glucose, Bld: 252 mg/dL — ABNORMAL HIGH (ref 70–99)
Potassium: 3.1 mmol/L — ABNORMAL LOW (ref 3.5–5.1)
Sodium: 141 mmol/L (ref 135–145)
Total Bilirubin: 0.9 mg/dL (ref 0.3–1.2)
Total Protein: 6.5 g/dL (ref 6.5–8.1)

## 2019-05-19 LAB — CBC
HCT: 39.7 % (ref 39.0–52.0)
Hemoglobin: 13.2 g/dL (ref 13.0–17.0)
MCH: 30.2 pg (ref 26.0–34.0)
MCHC: 33.2 g/dL (ref 30.0–36.0)
MCV: 90.8 fL (ref 80.0–100.0)
Platelets: 330 10*3/uL (ref 150–400)
RBC: 4.37 MIL/uL (ref 4.22–5.81)
RDW: 12.9 % (ref 11.5–15.5)
WBC: 20 10*3/uL — ABNORMAL HIGH (ref 4.0–10.5)
nRBC: 0 % (ref 0.0–0.2)

## 2019-05-19 LAB — GLUCOSE, CAPILLARY
Glucose-Capillary: 213 mg/dL — ABNORMAL HIGH (ref 70–99)
Glucose-Capillary: 406 mg/dL — ABNORMAL HIGH (ref 70–99)

## 2019-05-19 LAB — C-REACTIVE PROTEIN: CRP: 3.9 mg/dL — ABNORMAL HIGH (ref <1.0)

## 2019-05-19 LAB — MAGNESIUM: Magnesium: 1.8 mg/dL (ref 1.7–2.4)

## 2019-05-19 MED ORDER — RIVAROXABAN 15 MG PO TABS: 15.0000 mg | ORAL_TABLET | Freq: Two times a day (BID) | ORAL | Status: DC

## 2019-05-19 MED ORDER — INSULIN ASPART 100 UNIT/ML ~~LOC~~ SOLN
0.0000 [IU] | Freq: Three times a day (TID) | SUBCUTANEOUS | Status: DC
  Administered 2019-05-19: 5 [IU] via SUBCUTANEOUS

## 2019-05-19 MED ORDER — INSULIN ASPART 100 UNIT/ML ~~LOC~~ SOLN
5.0000 [IU] | Freq: Three times a day (TID) | SUBCUTANEOUS | Status: DC
  Administered 2019-05-19: 5 [IU] via SUBCUTANEOUS

## 2019-05-19 MED ORDER — INSULIN ASPART 100 UNIT/ML ~~LOC~~ SOLN: 0.0000 [IU] | Freq: Every day | SUBCUTANEOUS | Status: DC

## 2019-05-19 MED ORDER — ACETAMINOPHEN 325 MG PO TABS: 650.0000 mg | ORAL_TABLET | Freq: Four times a day (QID) | ORAL | Status: DC | PRN

## 2019-05-19 MED ORDER — OXYCODONE HCL 5 MG PO TABS: 2.5000 mg | ORAL_TABLET | ORAL | 0 refills | Status: DC | PRN

## 2019-05-19 MED ORDER — RIVAROXABAN 20 MG PO TABS: 20.0000 mg | ORAL_TABLET | Freq: Every day | ORAL | Status: DC

## 2019-05-19 MED ORDER — GLUCERNA SHAKE PO LIQD: 237.0000 mL | Freq: Three times a day (TID) | ORAL | 0 refills | Status: DC

## 2019-05-19 MED ORDER — AMLODIPINE BESYLATE 10 MG PO TABS: 10.0000 mg | ORAL_TABLET | Freq: Every day | ORAL | Status: DC

## 2019-05-19 MED ORDER — POTASSIUM CHLORIDE CRYS ER 20 MEQ PO TBCR
40.0000 meq | EXTENDED_RELEASE_TABLET | Freq: Once | ORAL | Status: AC
  Administered 2019-05-19: 14:00:00 40 meq via ORAL
  Filled 2019-05-19: qty 2

## 2019-05-19 MED ORDER — DEXAMETHASONE 6 MG PO TABS: 6.0000 mg | ORAL_TABLET | Freq: Every day | ORAL | Status: AC

## 2019-05-19 MED ORDER — INSULIN DETEMIR 100 UNIT/ML ~~LOC~~ SOLN
10.0000 [IU] | Freq: Two times a day (BID) | SUBCUTANEOUS | Status: DC
  Administered 2019-05-19: 10 [IU] via SUBCUTANEOUS
  Filled 2019-05-19 (×2): qty 0.1

## 2019-05-19 NOTE — Progress Notes (Signed)
Physical Therapy Treatment Patient Details Name: Hayden [name] MRN: [id number] DOB: 08-19-1940 Today's Date: 05/19/2019    History of Present Illness 78 year old Caucasian male who is a resident of [facility] memory care unit who has a history of dementia, Rt femur fx Aug 2020 ([facility]), leukemia, diabetes mellitus, hyperlipidemia GERD and fibromyalgia was reportedly diagnosed with COVID-19 about 2 days prior to this admission. While he was in the emergency department he had projectile vomiting of brown emesis.  He aspirated.  He spiked a fever.  He became hypoxic. +bil PE; +DVT RLE; transitioned from Heparin to Lovenox 05/15/19    PT Comments    Pt did well with mobility this pm. He was very cooperative and able to follow simple commands with increased time. Able to complete bed mob with mod a and cues, sit<>stand from elevated bed also with mod a and cues, able to ambulate small shuffled steps x 59ft from bed tor recliner set up by window, with mod a and RW. Pt on room air during tx and able to maintain saturation in 90s.     Follow Up Recommendations  SNF     Equipment Recommendations       Recommendations for Other Services       Precautions / Restrictions Precautions Precautions: Fall Restrictions Weight Bearing Restrictions: No    Mobility  Bed Mobility Overal bed mobility: Needs Assistance Bed Mobility: Rolling;Supine to Sit Rolling: Mod assist   Supine to sit: Mod assist        Transfers Overall transfer level: Needs assistance Equipment used: Rolling walker (2 wheeled) Transfers: Sit to/from Stand Sit to Stand: Mod assist            Ambulation/Gait Ambulation/Gait assistance: Mod assist Gait Distance (Feet): 5 Feet Assistive device: Rolling walker (2 wheeled) Gait Pattern/deviations: Shuffle Gait velocity: very slow   General Gait Details: ambulated approx 40ft from bed to recliner by window with mod a and RW   Stairs              Wheelchair Mobility    Modified Rankin (Stroke Patients Only)       Balance Overall balance assessment: Needs assistance;History of Falls Sitting-balance support: Bilateral upper extremity supported;Feet unsupported Sitting balance-Leahy Scale: Fair   Postural control: Other (comment)(did not notice lean in this session) Standing balance support: During functional activity;Bilateral upper extremity supported Standing balance-Leahy Scale: Poor                              Cognition Arousal/Alertness: Lethargic Behavior During Therapy: Agitated;Anxious Overall Cognitive Status: No family/caregiver present to determine baseline cognitive functioning                   Orientation Level: Disoriented to;Place;Time;Situation     Following Commands: Follows one step commands with increased time Safety/Judgement: Decreased awareness of safety;Decreased awareness of deficits   Problem Solving: Requires tactile cues;Requires verbal cues        Exercises      General Comments        Pertinent Vitals/Pain Pain Assessment: No/denies pain    Home Living                      Prior Function            PT Goals (current goals can now be found in the care plan section) Acute Rehab PT Goals Patient Stated Goal: states " i  feel terrible" Time For Goal Achievement: 05/30/19 Potential to Achieve Goals: Fair Progress towards PT goals: Progressing toward goals    Frequency    Min 2X/week      PT Plan Current plan remains appropriate    Co-evaluation              AM-PAC PT "6 Clicks" Mobility   Outcome Measure  Help needed turning from your back to your side while in a flat bed without using bedrails?: A Lot Help needed moving from lying on your back to sitting on the side of a flat bed without using bedrails?: A Lot Help needed moving to and from a bed to a chair (including a wheelchair)?: A Lot Help needed standing up from a  chair using your arms (e.g., wheelchair or bedside chair)?: A Lot Help needed to walk in hospital room?: A Lot Help needed climbing 3-5 steps with a railing? : Total 6 Click Score: 11    End of Session Equipment Utilized During Treatment: Gait belt Activity Tolerance: Patient tolerated treatment well Patient left: in chair;with call bell/phone within reach;with chair alarm set Nurse Communication: Mobility status;Other (comment)(pt post tx disposition) PT Visit Diagnosis: Other abnormalities of gait and mobility (R26.89);Unsteadiness on feet (R26.81);History of falling (Z91.81);Muscle weakness (generalized) (M62.81)     Time: 1203-1229 PT Time Calculation (min) (ACUTE ONLY): 26 min  Charges:  $Gait Training: 8-22 mins $Therapeutic Activity: 8-22 mins                     Horald Chestnut, PT    [facility] 05/19/2019, 1:55 PM

## 2019-05-19 NOTE — TOC Transition Note (Addendum)
Transition of Care [facility]) - CM/SW Discharge Note   Patient Details  Name: Hayden [name] MRN: [id number] Date of Birth: December 18, 1940  [facility] of [facility]) CM/SW Contact:  Ninfa Meeker, RN Phone Number: [phone] (working remotely)  05/19/2019, 12:44 PM   Clinical Narrative:   Patient will DC to: [facility] date: 05/19/19 Family notified: Hayden [name] [phone] Transport by: Trudie Reed p  Per MD patient is medically ready for discharge. Patient's bedside RN, charge nurse, patient's family and facility are notified of discharge plan. Bedside RN to call report to [phone], patient going to room 405. Case manager will fax DC summary, and FL2 to [phone]. Ambulance transport is arranged.     Final next level of care: Memory Care Barriers to Discharge: No Barriers Identified   Patient Goals and CMS Choice Patient states their goals for this hospitalization and ongoing recovery are:: per brother : wants him to get better CMS Medicare.gov Compare Post Acute Care list provided to:: Other (Comment Required)(returning to his facility) Choice offered to / list presented to : NA  Discharge Placement                       Discharge Plan and Services In-house Referral: NA Discharge Planning Services: CM Consult Post Acute Care Choice: NA          DME Arranged: N/A DME Agency: NA       HH Arranged: NA HH Agency: NA        Social Determinants of Health (SDOH) Interventions     Readmission Risk Interventions No flowsheet data found.

## 2019-05-19 NOTE — Plan of Care (Signed)
Paged Dr [facility] regarding patient blood sugar, Dr aware putting in orders for moderate scale insulin and Lantus

## 2019-05-19 NOTE — Discharge Summary (Addendum)
Hayden [name], is a 78 y.o. male  DOB [birth year]  MRN [id number].  Admission date:  05/13/2019  Admitting Physician  Terrilee Croak, MD  Discharge Date:  05/19/2019   Primary MD  Patient, No Pcp Per  Recommendations for primary care physician for things to follow:  -Patient discharged on Xarelto for VTE, loading dose 15 mg p.o. twice daily till 06/07/2019, and then Xarelto 20 mg p.o. daily from 06/08/2019 -On dysphagia 2 diet with nectar thick , will need continued close SLP follow-up and diet to be advanced per SLP recommendation, patient will need to be on aspiration precautions.  Encourage patient to  increase his fluid intake, he has a good appetite for nutritional supplements .  CODE STATUS DNR   Admission Diagnosis  Aspiration pneumonitis (HCC) [J69.0] SOB (shortness of breath) [R06.02] COVID-19 [U07.1]   Discharge Diagnosis  Aspiration pneumonitis (HCC) [J69.0] SOB (shortness of breath) [R06.02] COVID-19 [U07.1]    Active Problems:   Aspiration pneumonitis (HCC)   Acute pulmonary embolus (HCC)   Acute pulmonary embolism (HCC)   Pneumonia due to COVID-19 virus   Hospice care patient      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   Patient attempts to grab and or grope male patients   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   2008 diagnosed-monitored Dr     Past Surgical History:  Procedure Laterality Date   ADENOIDECTOMY     CARPAL TUNNEL RELEASE     rt hand   CATARACT EXTRACTION, [place]  07/26/2012   Procedure: CYSTOSCOPY;  Surgeon: Ailene Rud, MD;  Location: [facility];  Service: Urology;  Laterality: N/A;   DIAGNOSTIC CYSTO PROSTATE ULTRASOUND       FEMUR IM NAIL Right 02/10/2019   Procedure: INTRAMEDULLARY (IM) NAIL FEMORAL;  Surgeon: Hiram Gash, MD;  Location: [facility];  Service: Orthopedics;  Laterality: Right;   PROSTATE BIOPSY  07/26/2012   Procedure: PROSTATE BIOPSY;  Surgeon: Ailene Rud, MD;  Location: [facility];  Service: Urology;  Laterality: N/A;   retinal micro aneurysms     tonsil     VASECTOMY     VITRECTOMY         History of present illness and  Hospital Course:     Kindly see H&P for history of present illness and admission details, please review complete Labs, Consult reports and Test reports for all details in brief  HPI  from the history and physical done on the day of admission 05/13/2019  Hayden [name] is a 78 y.o. male a resident of [facility] with history of dementia, leukemia, diabetes mellitus, hyperlipidemia, GERD, fibromyalgia Patient was reportedly diagnosed positive for Covid 2 days ago at  the facility.  But he did not have any symptoms.  For some unknown reason, the supervisor at the facility called EMS and sent the patient to the ED.  Work-up showed WBC count 10.7, unremarkable BMP, CRP elevated to 25.7, lactic acid 1.9, procalcitonin less than 0.1, ferritin 311, LDH 298. Chest x-ray from 11/11 showed patchy density in the right lung base suspicious for pneumonia. Chest x-ray today showed improvement in the patchy opacity in the right lung base with mild residual opacity.  No new infiltrates. While in the ED, patient had projectile vomiting of brown emesis.  RN immediately suctioned patient's mouth but possible aspiration was noted.  He is spiked a fever of 103.3, became tachycardic up to 120 and required up to 8 L of oxygen by nasal cannula.    He was given a dose of IV Unasyn. Hospitalist service was called for inpatient admission and management.  At the time of my evaluation, patient was lying on bed.  Alert,  awake, tries to answer simple questions but not oriented to place, person or time.  Not restless or agitation.  I am not sure how much of this is different than his baseline.   Hospital Course    78 year old Caucasian male who is a resident of [facility] who has a history of dementia, leukemia, diabetes mellitus, hyperlipidemia GERD and fibromyalgia was reportedly diagnosed with COVID-19 about 2 days prior to this admission.  He did not have any symptoms.  For unclear reason patient was sent over to the emergency department.  Evaluation in the emergency department did reveal a elevated CRP of 25.7 lactic acid of 1.9 procalcitonin less than 0.1.  Chest x-ray showed patchy density in the right lung base suspicious for pneumonia.  This was from 11/11.  Chest x-ray from day of admission showed improvement in this patchy opacity.  While he was in the emergency department he had projectile vomiting of brown emesis.  He aspirated.  He spiked a fever.  He became hypoxic.  He was stabilized and then transferred to this hospital for further evaluation and management.  Acute Hypoxic Resp. Failure/Pneumonia due to COVID-19/aspiration pneumonia  COVID-19 Labs  Recent Labs    05/17/19 0510 05/18/19 0400 05/19/19 0326  CRP 8.0* 4.7* 3.9*    Lab Results  Component Value Date   SARSCOV2NAA NEGATIVE 02/15/2019   Bruni NEGATIVE 02/10/2019   COVID-19 Labs  Recent Labs (last 2 labs)        Recent Labs    05/16/19 0226 05/17/19 0510 05/18/19 0400  CRP 14.3* 8.0* 4.7*      Patient's COVID-19 test report is imported to epic under 'Media' in chart review.  This was done at [facility] on 04/30/2019.  Patient respiratory status has stabilized, no oxygen requirement for last 2 days, saturating well on room air, he was treated with IV Unasyn x5 days, received IV remdesivir x5 days as well, and he was on p.o. Decadron during hospital stay, to be discharged on another 5 days as  an outpatient, no diuretics required during hospital stay, and not on DVT prophylaxis during hospital stay given he was kept on Xarelto secondary to PE -Patient was seen by SLP, kept on dysphagia 2 diet with nectar thick, to be followed by SLP at facility and advanced as instructed by SLP. -Telemetry markers significantly elevated in admission, but CRP has significantly improved 0.9 on discharge , down from 25 on admission.  Acute pulmonary embolism and acute right lower extremity  DVT D-Dimer was noted to be greater than 20.  CT angiogram shows bilateral pulmonary embolism.  No evidence for saddle emboli.  No right heart strain.  Patient is stable.  Echocardiogram as above.  Normal EF was noted.  Plan was to transition him to an oral anticoagulant however due to the fact that patient was on carbamazepine this could not be done.  He was transitioned to Lovenox instead.   Dr Nicki Guadalajara  discussed carbamazepine with the patient's provider at [facility], Ms. Stormy Fabian, PA.  He was started on this primarily for behavioral issues and they are okay with stopping this medication if needed.  Patient without any history of seizure disorder.  Discussed with pharmacy ,  Okay to stop carbamazepine abruptly and those were taking it for reasons other than seizure disorder.    Carbamazepine was stopped 11/16, was transitioned from Lovenox to Xarelto.  History of dementia Continue home medications.  He is noted to be on Aricept.  Patient on carbamazepine mainly for behavioral issues.  He is also noted to be on Risperdal  as needed at the skilled nursing facility.  Carbamazepine discontinued as discussed above.  Diabetes mellitus type 2 with episodes of hypoglycemia and hyperglycemia Elevated CBGs due to steroids.  Will patient has been drinking a lot of flavored supplements including vital, and Glucerna which caused his CBG to be uncontrolled, but hemoglobin A1c is 6.9, which is appropriate, so he will be discharged  on his home regimen.  Elevated blood pressure Unclear if he has a history of essential hypertension or not.  Home medication list does not show any blood pressure lowering agents.  He has been on hydralazine as needed.  Some of his elevated blood pressure likely due to anxiety.  Steroid could also be contributing.  Patient started on amlodipine.  Continue to monitor.  Hypokalemia Repleted  Normocytic anemia Stable hemoglobin.  Continue to monitor.  No evidence of overt blood loss.  History of BPH Continue with Flomax.    Discharge Condition:  stable       Discharge Instructions  and  Discharge Medications    Discharge Instructions    Discharge instructions   Complete by: As directed    Follow with Primary MD Patient, No Pcp Per in 7 days   Get CBC, CMP,checked  by Primary MD next visit.    Activity: As tolerated with Full fall precautions use walker/cane & assistance as needed   Disposition SNF   Diet: Dysphagia 2/nectar thick/carb modified  , with feeding assistance and aspiration precautions.  Continue with SLP follow-up at facility and advance diet as recommended by SLP.   On your next visit with your primary care physician please Get Medicines reviewed and adjusted.   Please request your Prim.MD to go over all Hospital Tests and Procedure/Radiological results at the follow up, please get all Hospital records sent to your Prim MD by signing hospital release before you go home.   If you experience worsening of your admission symptoms, develop shortness of breath, life threatening emergency, suicidal or homicidal thoughts you must seek medical attention immediately by calling 911 or calling your MD immediately  if symptoms less severe.  You Must read complete instructions/literature along with all the possible adverse reactions/side effects for all the Medicines you take and that have been prescribed to you. Take any new Medicines after you have completely  understood and accpet all the possible adverse reactions/side effects.   Do not drive, operating heavy machinery, perform  activities at heights, swimming or participation in water activities or provide baby sitting services if your were admitted for syncope or siezures until you have seen by Primary MD or a Neurologist and advised to do so again.  Do not drive when taking Pain medications.    Do not take more than prescribed Pain, Sleep and Anxiety Medications  Special Instructions: If you have smoked or chewed Tobacco  in the last 2 yrs please stop smoking, stop any regular Alcohol  and or any Recreational drug use.  Wear Seat belts while driving.   Please note  You were cared for by a hospitalist during your hospital stay. If you have any questions about your discharge medications or the care you received while you were in the hospital after you are discharged, you can call the unit and asked to speak with the hospitalist on call if the hospitalist that took care of you is not available. Once you are discharged, your primary care physician will handle any further medical issues. Please note that NO REFILLS for any discharge medications will be authorized once you are discharged, as it is imperative that you return to your primary care physician (or establish a relationship with a primary care physician if you do not have one) for your aftercare needs so that they can reassess your need for medications and monitor your lab values.   Increase activity slowly   Complete by: As directed      Allergies as of 05/19/2019      Reactions   Bee Venom Other (See Comments)   Bump where stung and took days to go away   Glucophage [metformin] Diarrhea   "I will never take it again - stool incontinent"   Shellfish Allergy Swelling   Grass Extracts [gramineae Pollens] Other (See Comments)   Unknown rxn   Tobacco [tobacco] Other (See Comments)   Unknown rxn      Medication List    STOP taking  these medications   aspirin 81 MG chewable tablet   carbamazepine 200 MG tablet Commonly known as: TEGRETOL   celecoxib 200 MG capsule Commonly known as: CELEBREX   enoxaparin 40 MG/0.4ML injection Commonly known as: LOVENOX   LORazepam 1 MG tablet Commonly known as: ATIVAN     TAKE these medications   acetaminophen 325 MG tablet Commonly known as: TYLENOL Take 2 tablets (650 mg total) by mouth every 6 (six) hours as needed for mild pain (or Fever >/= 101). What changed:   medication strength  how much to take  when to take this  reasons to take this   amLODipine 10 MG tablet Commonly known as: NORVASC Take 1 tablet (10 mg total) by mouth daily. Start taking on: May 20, 2019   Aspercreme w/Lidocaine 4 % Crea Generic drug: Lidocaine HCl Apply 1 application topically 2 (two) times daily. On back   BAZA EX Apply 1 application topically as needed (incontinence episode). Buttocks and scrotum   Belsomra 5 MG Tabs Generic drug: Suvorexant Take 5 mg by mouth at bedtime.   dexamethasone 6 MG tablet Commonly known as: DECADRON Take 1 tablet (6 mg total) by mouth daily for 4 days. Start taking on: May 20, 2019   donepezil 10 MG tablet Commonly known as: ARICEPT Take 1 tablet (10 mg total) by mouth at bedtime.   EPINEPHrine 0.15 MG/0.3ML injection Commonly known as: EPIPEN JR Inject 0.15 mg into the muscle as needed for anaphylaxis.   ergocalciferol 1.25 MG (50000 UT) capsule  Commonly known as: VITAMIN D2 Take 50,000 Units by mouth once a week. Mondays   VITAMIN D2 PO Take 1 capsule by mouth daily.   feeding supplement (GLUCERNA SHAKE) Liqd Take 237 mLs by mouth 3 (three) times daily between meals.   guaiFENesin 100 MG/5ML liquid Commonly known as: ROBITUSSIN Take 200 mg by mouth every 6 (six) hours as needed for cough.   insulin lispro 100 UNIT/ML KwikPen Commonly known as: HUMALOG Inject 6 Units into the skin 3 (three) times daily with  meals.   Levemir FlexTouch 100 UNIT/ML Pen Generic drug: Insulin Detemir Inject 24 Units into the skin at bedtime.   loperamide 2 MG capsule Commonly known as: IMODIUM Take 2 mg by mouth See admin instructions. Take 2 mg by mouth with each loose stool as needed for diarrhea (do not exceed 8 doses in 24 hours)   lovastatin 20 MG tablet Commonly known as: MEVACOR Take 20 mg by mouth daily.   magnesium hydroxide 400 MG/5ML suspension Commonly known as: MILK OF MAGNESIA Take 30 mLs by mouth at bedtime as needed for mild constipation.   neomycin-bacitracin-polymyxin [phone] ointment Apply 1 application topically See admin instructions. *Standing order for skin tear, abrasions* Clean area with normal saline. Apply tao, cover with bandaid or gauze and tape. Change as needed until healed.   nystatin cream Commonly known as: MYCOSTATIN Apply to affected area 2 times daily   omeprazole 20 MG capsule Commonly known as: PRILOSEC Take 1 capsule (20 mg total) by mouth daily.   oxyCODONE 5 MG immediate release tablet Commonly known as: Oxy IR/ROXICODONE Take 0.5 tablets (2.5 mg total) by mouth every 4 (four) hours as needed for severe pain. What changed:   how much to take  how to take this  when to take this  reasons to take this  additional instructions   risperiDONE 1 MG tablet Commonly known as: RISPERDAL Take 1 mg by mouth daily as needed (agitation).   Rivaroxaban 15 MG Tabs tablet Commonly known as: XARELTO Take 1 tablet (15 mg total) by mouth 2 (two) times daily with a meal for 19 days. Xarelto loading dose until 06/07/2019, then he will be transitioned to Xarelto 20 mg p.o. once daily on 06/08/2019   rivaroxaban 20 MG Tabs tablet Commonly known as: XARELTO Take 1 tablet (20 mg total) by mouth daily with supper. Patient will be on Xarelto loading dose until 06/07/2019, then he will be transitioned to this dose Xarelto 20 mg p.o. once daily on 06/08/2019 Start taking on:  June 08, 2019   sertraline 50 MG tablet Commonly known as: ZOLOFT Take 50 mg by mouth daily.   tamsulosin 0.4 MG Caps capsule Commonly known as: FLOMAX Take 0.4 mg by mouth daily.   traZODone 50 MG tablet Commonly known as: DESYREL Take 50 mg by mouth every 8 (eight) hours as needed (agitation).   Vitamin D3 1.25 MG (50000 UT) Caps Take 1.25 mg by mouth once a week. Mondays         Diet and Activity recommendation: See Discharge Instructions above   Consults obtained -  None   Major procedures and Radiology Reports - PLEASE review detailed and final reports for all details, in brief -     Ct Head Wo Contrast  Result Date: 05/11/2019 CLINICAL DATA:  Patient was found down. EXAM: CT HEAD WITHOUT CONTRAST TECHNIQUE: Contiguous axial images were obtained from the base of the skull through the vertex without intravenous contrast. COMPARISON:  CT scan dated  02/10/2019 FINDINGS: Brain: There is no acute infarction or hemorrhage. Diffuse cerebral cortical atrophy with secondary slight ventricular dilatation, stable. Vascular: No hyperdense vessel or unexpected calcification. Skull: Normal. Negative for fracture or focal lesion. Sinuses/Orbits: Normal. Other: None IMPRESSION: No acute abnormalities. Diffuse atrophy. Electronically Signed   By: [name] M.D.   On: 05/11/2019 19:08   Ct Angio Chest Pe W Or Wo Contrast  Addendum Date: 05/14/2019   ADDENDUM REPORT: 05/14/2019 02:55 ADDENDUM: The musculoskeletal section should read degenerative changes of the thoracic spine are seen. Additionally there is a mildly displaced fracture of the left eighth rib posteriorly. Old rib fractures are noted on the left as well. Electronically Signed   By: Inez Catalina M.D.   On: 05/14/2019 02:55   Result Date: 05/14/2019 CLINICAL DATA:  Difficulty breathing and positive D-dimer, history of COVID-19 positivity EXAM: CT ANGIOGRAPHY CHEST WITH CONTRAST TECHNIQUE: Multidetector CT imaging of  the chest was performed using the standard protocol during bolus administration of intravenous contrast. Multiplanar CT image reconstructions and MIPs were obtained to evaluate the vascular anatomy. CONTRAST:  159mL OMNIPAQUE IOHEXOL 350 MG/ML SOLN COMPARISON:  Chest x-ray from the previous day. FINDINGS: Cardiovascular: Thoracic aorta demonstrates a normal branching pattern. No aneurysmal dilatation or dissection is seen. Mild atherosclerotic changes are noted. No cardiac enlargement is noted. The pulmonary artery shows a normal branching pattern with filling defects within the lower lobes bilaterally left slightly greater than right consistent with pulmonary emboli. No evidence of right heart strain is noted. Mediastinum/Nodes: Thoracic inlet is within normal limits. No hilar or mediastinal adenopathy is noted. The esophagus as visualized is within normal. Lungs/Pleura: Lungs are well aerated bilaterally. Patchy lower lobe infiltrates are seen bilaterally left right slightly greater than left. No sizable effusion is noted. Upper Abdomen: Visualized upper abdomen is within normal limits. Musculoskeletal: Degenerative changes of the thoracic spine are seen. No acute rib abnormality is noted. Review of the MIP images confirms the above findings. IMPRESSION: Changes consistent with bilateral pulmonary emboli without evidence of right heart strain. Bibasilar infiltrates consistent with the given clinical history of COVID-19 positivity and likely accentuated bilateral lower lobe pulmonary emboli. Aortic Atherosclerosis (ICD10-I70.0). These results will be called to the ordering clinician or representative by the Radiologist Assistant, and communication documented in the PACS or zVision Dashboard. Electronically Signed: By: Inez Catalina M.D. On: 05/14/2019 01:40   Dg Chest Port 1 View  Result Date: 05/14/2019 CLINICAL DATA:  COVID positive. Shortness of breath. EXAM: PORTABLE CHEST 1 VIEW COMPARISON:  None.  FINDINGS: Normal heart size. No pleural effusion identified. Bilateral lower lobe hazy lung opacities are identified corresponding to the CT abnormality from earlier today. The visualized osseous structures are unremarkable. IMPRESSION: Mild bilateral lower lobe hazy lung opacities compatible with the clinical history of COVID-19 positivity. Electronically Signed   By: Kerby Moors M.D.   On: 05/14/2019 06:21   Dg Chest Port 1 View  Result Date: 05/13/2019 CLINICAL DATA:  COVID-19 positive. EXAM: PORTABLE CHEST 1 VIEW COMPARISON:  May 11, 2019 FINDINGS: The heart size and mediastinal contours are within normal limits. Previously noted patchy opacity of the right lung base is improved compared to prior exam. The left lung is clear. The visualized skeletal structures are unremarkable. IMPRESSION: The previously noted patchy opacity of right lung base is improved with mild residual opacity identified on current exam. No new infiltrates are noted. Electronically Signed   By: Abelardo Diesel M.D.   On: 05/13/2019 10:29  Dg Chest Port 1 View  Result Date: 05/11/2019 CLINICAL DATA:  Fall, dark emesis EXAM: PORTABLE CHEST 1 VIEW COMPARISON:  April 07, 2018 FINDINGS: New patchy density at the right lung base. No pleural effusion. Stable cardiomediastinal contours with normal heart size. IMPRESSION: Patchy density at the right lung base suspicious for pneumonia. Electronically Signed   By: Macy Mis M.D.   On: 05/11/2019 13:43   Vas [place] Lower Extremity Venous (dvt)  Result Date: 05/15/2019  Lower Venous Study Indications: Elevated d-dimer.  Limitations: Body habitus, poor ultrasound/tissue interface and patient unable to cooperate, poor patient position, posterior acoustic shadowing secondary to arterial calcification obscuring view of vein. Comparison Study: No prior study Performing Technologist: Maudry Mayhew MHA, RDMS, RVT, RDCS  Examination Guidelines: A complete evaluation includes B-mode  imaging, spectral Doppler, color Doppler, and power Doppler as needed of all accessible portions of each vessel. Bilateral testing is considered an integral part of a complete examination. Limited examinations for reoccurring indications may be performed as noted.  +---------+---------------+---------+-----------+----------+--------------+  RIGHT     Compressibility Phasicity Spontaneity Properties Thrombus Aging  +---------+---------------+---------+-----------+----------+--------------+  CFV       Full            No        Yes                                    +---------+---------------+---------+-----------+----------+--------------+  SFJ       Full                                                             +---------+---------------+---------+-----------+----------+--------------+  FV Prox   Full                                                             +---------+---------------+---------+-----------+----------+--------------+  FV Mid    Full                                                             +---------+---------------+---------+-----------+----------+--------------+  FV Distal Full                                                             +---------+---------------+---------+-----------+----------+--------------+  PFV       Full                                                             +---------+---------------+---------+-----------+----------+--------------+  POP  Not visualized  +---------+---------------+---------+-----------+----------+--------------+  PTV       None                      No                     Acute           +---------+---------------+---------+-----------+----------+--------------+  PERO      Full                                                             +---------+---------------+---------+-----------+----------+--------------+  +---------+---------------+---------+-----------+----------+--------------+  LEFT       Compressibility Phasicity Spontaneity Properties Thrombus Aging  +---------+---------------+---------+-----------+----------+--------------+  CFV       Full            No        Yes                                    +---------+---------------+---------+-----------+----------+--------------+  SFJ       Full                                                             +---------+---------------+---------+-----------+----------+--------------+  FV Prox   Full                                                             +---------+---------------+---------+-----------+----------+--------------+  FV Mid    Full                                                             +---------+---------------+---------+-----------+----------+--------------+  FV Distal                 No        Yes                                    +---------+---------------+---------+-----------+----------+--------------+  POP       Full            Yes       Yes                                    +---------+---------------+---------+-----------+----------+--------------+  PTV       Full                                                             +---------+---------------+---------+-----------+----------+--------------+  PERO                                                       Not visualized  +---------+---------------+---------+-----------+----------+--------------+  Summary: Right: Findings consistent with acute deep vein thrombosis involving the right posterior tibial veins. Left: There is no evidence of deep vein thrombosis in the lower extremity. However, portions of this examination were limited- see technologist comments above. No cystic structure found in the popliteal fossa.  Pulsatile venous flow is suggestive of possibly elevated right heart pressure. *See table(s) above for measurements and observations. Electronically signed by Monica Martinez MD on 05/15/2019 at 10:39:59 AM.    Final     Micro Results     Recent Results  (from the past 240 hour(s))  Blood Culture (routine x 2)     Status: None   Collection Time: 05/13/19  3:19 PM   Specimen: BLOOD LEFT ARM  Result Value Ref Range Status   Specimen Description   Final    BLOOD LEFT ARM Performed at [facility], [address], [place], [place] [address]    Special Requests   Final    BOTTLES DRAWN AEROBIC ONLY Blood Culture adequate volume Performed at [facility], [address], [place], [place] [address]    Culture   Final    NO GROWTH 5 DAYS Performed at [facility], [place] [address], [place], [place] [address]    Report Status 05/18/2019 FINAL  Final  Blood Culture (routine x 2)     Status: None   Collection Time: 05/13/19  3:19 PM   Specimen: BLOOD RIGHT HAND  Result Value Ref Range Status   Specimen Description   Final    BLOOD RIGHT HAND Performed at [facility], [place] [address] [place], [place] [address]    Special Requests   Final    BOTTLES DRAWN AEROBIC ONLY Blood Culture adequate volume Performed at [facility], [address], [place], [place] [address]    Culture   Final    NO GROWTH 5 DAYS Performed at [facility], [place] [address], [place], [place] [address]    Report Status 05/18/2019 FINAL  Final  MRSA PCR Screening     Status: None   Collection Time: 05/14/19  2:41 AM   Specimen: Nasal Mucosa; Nasopharyngeal  Result Value Ref Range Status   MRSA by PCR NEGATIVE NEGATIVE Final    Comment:        The GeneXpert MRSA Assay (FDA approved for NASAL specimens only), is one component of a comprehensive MRSA colonization surveillance program. It is not intended to diagnose MRSA infection nor to guide or monitor treatment for MRSA infections. Performed at [facility], [place] [address], [place], [place] [address]        Today   Subjective:   Hayden [name] today with no significant  events overnight as discussed with staff, and had good appetite, where he did drink 2 vital drinks, with multiple juices this morning .   Objective:   Blood pressure 112/68, pulse 86, temperature 97.6 F (36.4 C), temperature source Oral, resp. rate 13, height 5' 5.98" (1.676 m), weight 63 kg, SpO2 98 %.   Intake/Output Summary (Last 24 hours) at 05/19/2019 1342 Last  data filed at 05/19/2019 1300 Gross per 24 hour  Intake 240 ml  Output 850 ml  Net -610 ml    Exam Patient is awake, more conversant today, but remains easily distracted, with some dementia, and baseline aphasia. Symmetrical Chest wall movement, Good air movement bilaterally, CTAB RRR,No Gallops,Rubs or new Murmurs, No Parasternal Heave +ve B.Sounds, Abd Soft, Non tender,No rebound -guarding or rigidity. No Cyanosis, Clubbing or edema, No new Rash or bruise  Data Review   CBC w Diff:  Lab Results  Component Value Date   WBC 20.0 (H) 05/19/2019   HGB 13.2 05/19/2019   HGB 13.4 03/01/2014   HCT 39.7 05/19/2019   HCT 42.1 07/04/2018   HCT 40.0 03/01/2014   PLT 330 05/19/2019   PLT 163 03/01/2014   LYMPHOPCT 33 05/13/2019   LYMPHOPCT 64.5 (H) 03/01/2014   BANDSPCT 0 07/16/2017   MONOPCT 3 05/13/2019   MONOPCT 3.1 03/01/2014   EOSPCT 0 05/13/2019   EOSPCT 1.7 03/01/2014   BASOPCT 0 05/13/2019   BASOPCT 0.2 03/01/2014    CMP:  Lab Results  Component Value Date   NA 141 05/19/2019   NA 139 03/01/2014   K 3.1 (L) 05/19/2019   K 4.4 03/01/2014   CL 109 05/19/2019   CO2 19 (L) 05/19/2019   CO2 22 03/01/2014   BUN 28 (H) 05/19/2019   BUN 21.3 03/01/2014   CREATININE 0.72 05/19/2019   CREATININE 0.9 03/01/2014   PROT 6.5 05/19/2019   PROT 6.4 03/01/2014   ALBUMIN 3.1 (L) 05/19/2019   ALBUMIN 4.1 03/01/2014   BILITOT 0.9 05/19/2019   BILITOT 0.84 03/01/2014   ALKPHOS 102 05/19/2019   ALKPHOS 58 03/01/2014   AST 36 05/19/2019   AST 16 03/01/2014   ALT 47 (H) 05/19/2019   ALT 18 03/01/2014   .   Total Time in preparing paper work, data evaluation and todays exam - 84 minutes  Phillips Climes M.D on 05/19/2019 at [facility]   Office  [phone]

## 2019-05-19 NOTE — Progress Notes (Signed)
  Speech Language Pathology Treatment: Dysphagia  Patient Details Name: Hayden [name] MRN: [id number] DOB: 1941-05-29 Today's Date: 05/19/2019 Time: [id number] SLP Time Calculation (min) (ACUTE ONLY): 10 min  Assessment / Plan / Recommendation Clinical Impression  Pt awaiting D/C to SNF this afternoon.  He is more cooperative and interactive than during other prior visits, and today was able to drink thin liquids in isolation with no overt s/s of aspiration when limited to one/two sips at a time.  He continues to require verbal cues to slow rate for improved safety.  Recommend ongoing SLP f/u at SNF, particularly to address reassessment of thin liquids in the context of a meal.  Hopefully, as his MS continues to improve he can advance back to a regular diet and thin liquids.    HPI HPI: Patient is a 78 y.o. male a resident of [facility] with history of dementia, leukemia, diabetes mellitus, hyperlipidemia, GERD, fibromyalgia. Patient was reportedly diagnosed positive for Covid 2 days ago at the facility.  But he did not have any symptoms.  For some unknown reason, the supervisor at the facility called EMS and sent the patient to the ED. While in the ED, patient had projectile vomiting of brown emesis.  RN immediately suctioned patient's mouth but possible aspiration was noted.  He is spiked a fever of 103.3, became tachycardic up to 120 and required up to 8 L of oxygen by nasal cannula. CXR shows Mild bilateral lower lobe hazy lung opacities compatible with the clinical history of COVID-19 positivity. Pt then found to have had a large PE on CTA and put on heparin. Later, pt was given a sip of water by RN and had some coughing. Pt made NPO.       SLP Plan  Continue with current plan of care       Recommendations  Diet recommendations: Dysphagia 2 (fine chop);Nectar-thick liquid Liquids provided via: Cup;Straw Medication Administration: Whole meds with puree Supervision: Intermittent  supervision to cue for compensatory strategies Compensations: Slow rate;Small sips/bites Postural Changes and/or Swallow Maneuvers: Seated upright 90 degrees                Oral Care Recommendations: Oral care BID Follow up Recommendations: Skilled Nursing facility SLP Visit Diagnosis: Dysphagia, oropharyngeal phase (R13.12) Plan: Continue with current plan of care       GO              Karilyn Wind L. Tivis Ringer, [place] Office number [phone] Pager [phone]   Juan Quam Laurice 05/19/2019, 2:55 PM

## 2019-05-19 NOTE — Plan of Care (Signed)
Spoke with pt brother Anuraag Tissot and informed him of the plan of care. Brother verbalized understanding. Pt teaching needs more reinforcement. Continuing to monitor pt. Safety maintained.

## 2019-07-04 ENCOUNTER — Telehealth (INDEPENDENT_AMBULATORY_CARE_PROVIDER_SITE_OTHER): Payer: Medicare Other | Admitting: Neurology

## 2019-07-04 ENCOUNTER — Telehealth: Payer: Self-pay

## 2019-07-04 ENCOUNTER — Other Ambulatory Visit: Payer: Self-pay

## 2019-07-04 ENCOUNTER — Encounter: Payer: Self-pay | Admitting: Neurology

## 2019-07-04 VITALS — BP 135/86 | HR 82 | Ht 65.0 in | Wt 134.5 lb

## 2019-07-04 DIAGNOSIS — F0391 Unspecified dementia with behavioral disturbance: Secondary | ICD-10-CM | POA: Diagnosis not present

## 2019-07-04 DIAGNOSIS — F03B18 Unspecified dementia, moderate, with other behavioral disturbance: Secondary | ICD-10-CM

## 2019-07-04 NOTE — Telephone Encounter (Signed)
Called no answer VML for memory med tech to call back to go over medication

## 2019-07-04 NOTE — Progress Notes (Signed)
Virtual Visit via Telephone Note The purpose of this virtual visit is to provide medical care while limiting exposure to the novel coronavirus.    Consent was obtained for phone visit:  Yes.   Answered questions that patient had about telehealth interaction:  Yes.   I discussed the limitations, risks, security and privacy concerns of performing an evaluation and management service by telephone. I also discussed with the patient that there may be a patient responsible charge related to this service. The patient expressed understanding and agreed to proceed.  Pt location: Home Physician Location: office Name of referring provider:  No ref. provider found I connected with .Hayden [name] at patients initiation/request on 07/04/2019 at 11:30 AM EST by telephone and verified that I am speaking with the correct person using two identifiers.  Pt MRN:  [id number] Pt DOB:  1940/12/08   History of Present Illness:  The patient had a telephone visit on 07/04/2019. Staff Hayden [name]) is present to provide additional information. He was last evaluated via telephone visit 6 months ago for moderate dementia with behavioral disturbance. MOCA [name] 15/22 in June 2020. He is on Donepezil 10mg  daily without side effects.  He feels his memory is fair. Hayden [name] reports he is overall stable and doing well. He is "temperamental" but responds to redirection. No hallucinations. He is on Sertraline, Risperidone, and Trazodone. Sleep and appetite are good. He is independent with dressing and bathing. He states his mood is good. He reports tripping 2-3 days ago, no injuries, he was not paying attention. He denies any headaches, dizziness, vision changes, focal numbness/tingling/weakness.  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 He reports his memory is "shot." History is mostly  obtained from Epic,patient is a poor historian but able to provide some history, his caregiver has been with him only for the past month. He reports that he stopped driving 1-2 years ago, he does recall getting lost driving a few times. He recalls an episode where he started walking to the bank and a family member saw him on the street and asked him to ride the car. It is unclear when this occurred. He is quite verbose and sometimes tangential, with loose associations. His son has been in charge of finances for the past 2-3 years, he does note he was forgetting bill payments. He states his son has an "iron grip on my life." He has caregivers staying 16 hours a day with him, fixing meals and checking he takes his medications. He takes his medications by himself, once in a while she helps. He is independent with dressing and bathing. His caregiver feels he may need someone with him at night, she thinks he is up at night. No clear wandering behavior. He has a history of insomnia "I have nothing but hard nights," Trazodone does not help with sleep much. He reports his son "unofficially bumped my dose of Ambien" which is much for effective for him, he will discuss this with Dr. Orland Mustard. He was admitted to [facility] last month for altered mental status. 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 There was also note of increased signal and decreased size in the left posterior hippocampus. He denies any significant head injuries. He denies any seizure-like symptoms, no olfactory/gustatory hallucinations, focal numbness/tingling/weakness. His  caregiver denies any staring/unresponsive episodes.   He is occasionally wobbly when he gets up. 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 He denies any neck/back pain. He has urinary incontinence and occasional bowel incontinence. He denies any headaches,  diplopia, dysarthria/dysphagia, anosmia, or tremors. His caregiver denies any hallucinations or significant behavioral issues, he rarely gets cranky when tired/overworked or frustrated due to his memory. No family history of dementia. He denies alcohol intake.     Observations/Objective:  Limited due to nature of phone visit. He is awake, alert, answers questions and appears less tangential today, repeats himself several times.  Montreal Cognitive Assessment [name] 07/04/2019 12/20/2018  Attention: Read list of digits (0/2) 2 2  Attention: Read list of letters (0/1) 1 1  Attention: Serial 7 subtraction starting at 100 (0/3) 3 3  Language: Repeat phrase (0/2) 2 1  Language : Fluency (0/1) 0 1  Abstraction (0/2) 2 2  Delayed Recall (0/5) 2 1  Orientation (0/6) 2 4  Total 14 15  Adjusted Score (based on education) - 15      Assessment and Plan:   This is a pleasant 79 yo RH man with a history of  hyperlipidemia, diabetes, CLL, with moderate dementia with behavioral disturbance. Hayden [name] (done over phone) today  14/22 (15/22 in June 2020, MMSE 26/30 in November 2019, 25/30 in April 2019). Continue Donepezil 10mg  daily. Behavioral changes have been stable per staff, he is on Risperdal, Sertraline, and Trazodone managed by Psychiatry. Continue with 24/7 supervision. He will follow-up in 6 months and knows to call for any changes.    Follow Up Instructions:   -I discussed the assessment and treatment plan with the patient/staff. The patient/staff were provided an opportunity to ask questions and all were answered. The patient/staff agreed with the plan and demonstrated an understanding of the instructions.   The patient/staff were advised to call back or seek an in-person evaluation if the symptoms worsen or if the condition fails to improve as anticipated.    Total Time spent in visit with the patient was:  17 minutes, of which 100% of the time was spent in counseling and/or coordinating care  on the above.   Pt understands and agrees with the plan of care outlined.     Hayden Sprang, MD

## 2019-12-03 ENCOUNTER — Encounter (HOSPITAL_COMMUNITY): Payer: Self-pay

## 2019-12-03 ENCOUNTER — Emergency Department (HOSPITAL_COMMUNITY): Payer: Medicare Other

## 2019-12-03 ENCOUNTER — Inpatient Hospital Stay (HOSPITAL_COMMUNITY)
Admission: EM | Admit: 2019-12-03 | Discharge: 2019-12-06 | DRG: 177 | Disposition: A | Discharge status: Skilled Nursing Facility | Payer: Medicare Other | Source: Skilled Nursing Facility | Attending: Internal Medicine | Admitting: Internal Medicine

## 2019-12-03 ENCOUNTER — Other Ambulatory Visit: Payer: Self-pay

## 2019-12-03 ENCOUNTER — Emergency Department (HOSPITAL_COMMUNITY)
Admission: EM | Admit: 2019-12-03 | Discharge: 2019-12-03 | Disposition: A | Discharge status: Skilled Nursing Facility | Payer: Medicare Other | Source: Home / Self Care | Attending: Emergency Medicine | Admitting: Emergency Medicine

## 2019-12-03 DIAGNOSIS — G20 Parkinson's disease: Secondary | ICD-10-CM | POA: Diagnosis present

## 2019-12-03 DIAGNOSIS — Z8616 Personal history of COVID-19: Secondary | ICD-10-CM

## 2019-12-03 DIAGNOSIS — M6289 Other specified disorders of muscle: Secondary | ICD-10-CM

## 2019-12-03 DIAGNOSIS — F0390 Unspecified dementia without behavioral disturbance: Secondary | ICD-10-CM | POA: Diagnosis present

## 2019-12-03 DIAGNOSIS — J189 Pneumonia, unspecified organism: Secondary | ICD-10-CM | POA: Diagnosis present

## 2019-12-03 DIAGNOSIS — E785 Hyperlipidemia, unspecified: Secondary | ICD-10-CM | POA: Diagnosis present

## 2019-12-03 DIAGNOSIS — Z66 Do not resuscitate: Secondary | ICD-10-CM | POA: Diagnosis present

## 2019-12-03 DIAGNOSIS — K219 Gastro-esophageal reflux disease without esophagitis: Secondary | ICD-10-CM | POA: Diagnosis present

## 2019-12-03 DIAGNOSIS — N400 Enlarged prostate without lower urinary tract symptoms: Secondary | ICD-10-CM | POA: Diagnosis present

## 2019-12-03 DIAGNOSIS — G9341 Metabolic encephalopathy: Secondary | ICD-10-CM | POA: Diagnosis present

## 2019-12-03 DIAGNOSIS — Z79899 Other long term (current) drug therapy: Secondary | ICD-10-CM

## 2019-12-03 DIAGNOSIS — E119 Type 2 diabetes mellitus without complications: Secondary | ICD-10-CM

## 2019-12-03 DIAGNOSIS — J690 Pneumonitis due to inhalation of food and vomit: Secondary | ICD-10-CM | POA: Diagnosis not present

## 2019-12-03 DIAGNOSIS — M797 Fibromyalgia: Secondary | ICD-10-CM | POA: Diagnosis present

## 2019-12-03 DIAGNOSIS — C9110 Chronic lymphocytic leukemia of B-cell type not having achieved remission: Secondary | ICD-10-CM | POA: Diagnosis present

## 2019-12-03 DIAGNOSIS — Z7901 Long term (current) use of anticoagulants: Secondary | ICD-10-CM

## 2019-12-03 DIAGNOSIS — C9111 Chronic lymphocytic leukemia of B-cell type in remission: Secondary | ICD-10-CM | POA: Diagnosis present

## 2019-12-03 DIAGNOSIS — R0602 Shortness of breath: Secondary | ICD-10-CM | POA: Diagnosis not present

## 2019-12-03 DIAGNOSIS — Z981 Arthrodesis status: Secondary | ICD-10-CM

## 2019-12-03 DIAGNOSIS — Z91013 Allergy to seafood: Secondary | ICD-10-CM

## 2019-12-03 DIAGNOSIS — Z794 Long term (current) use of insulin: Secondary | ICD-10-CM

## 2019-12-03 DIAGNOSIS — Z86711 Personal history of pulmonary embolism: Secondary | ICD-10-CM

## 2019-12-03 DIAGNOSIS — M7989 Other specified soft tissue disorders: Secondary | ICD-10-CM

## 2019-12-03 DIAGNOSIS — I10 Essential (primary) hypertension: Secondary | ICD-10-CM | POA: Diagnosis present

## 2019-12-03 DIAGNOSIS — R4182 Altered mental status, unspecified: Secondary | ICD-10-CM | POA: Diagnosis present

## 2019-12-03 LAB — CBC WITH DIFFERENTIAL/PLATELET
Abs Immature Granulocytes: 0.03 10*3/uL (ref 0.00–0.07)
Basophils Absolute: 0 10*3/uL (ref 0.0–0.1)
Basophils Relative: 0 %
Eosinophils Absolute: 0.1 10*3/uL (ref 0.0–0.5)
Eosinophils Relative: 1 %
HCT: 40.3 % (ref 39.0–52.0)
Hemoglobin: 13.5 g/dL (ref 13.0–17.0)
Immature Granulocytes: 0 %
Lymphocytes Relative: 51 %
Lymphs Abs: 6.1 10*3/uL — ABNORMAL HIGH (ref 0.7–4.0)
MCH: 31.2 pg (ref 26.0–34.0)
MCHC: 33.5 g/dL (ref 30.0–36.0)
MCV: 93.1 fL (ref 80.0–100.0)
Monocytes Absolute: 0.4 10*3/uL (ref 0.1–1.0)
Monocytes Relative: 3 %
Neutro Abs: 5.3 10*3/uL (ref 1.7–7.7)
Neutrophils Relative %: 45 %
Platelets: 242 10*3/uL (ref 150–400)
RBC: 4.33 MIL/uL (ref 4.22–5.81)
RDW: 13.8 % (ref 11.5–15.5)
WBC: 11.9 10*3/uL — ABNORMAL HIGH (ref 4.0–10.5)
nRBC: 0 % (ref 0.0–0.2)

## 2019-12-03 LAB — URINALYSIS, ROUTINE W REFLEX MICROSCOPIC
Bacteria, UA: NONE SEEN
Bilirubin Urine: NEGATIVE
Glucose, UA: >=500 mg/dL — AB
Hgb urine dipstick: NEGATIVE
Ketones, ur: NEGATIVE mg/dL
Leukocytes,Ua: NEGATIVE
Nitrite: NEGATIVE
Protein, ur: NEGATIVE mg/dL
Specific Gravity, Urine: 1.013 (ref 1.005–1.030)
pH: 7 (ref 5.0–8.0)

## 2019-12-03 LAB — COMPREHENSIVE METABOLIC PANEL
ALT: 25 U/L (ref 0–44)
AST: 33 U/L (ref 15–41)
Albumin: 4.3 g/dL (ref 3.5–5.0)
Alkaline Phosphatase: 87 U/L (ref 38–126)
Anion gap: 13 (ref 5–15)
BUN: 19 mg/dL (ref 8–23)
CO2: 25 mmol/L (ref 22–32)
Calcium: 9.7 mg/dL (ref 8.9–10.3)
Chloride: 102 mmol/L (ref 98–111)
Creatinine, Ser: 0.71 mg/dL (ref 0.61–1.24)
GFR calc Af Amer: >60 mL/min (ref >60)
GFR calc non Af Amer: >60 mL/min (ref >60)
Glucose, Bld: 127 mg/dL — ABNORMAL HIGH (ref 70–99)
Potassium: 3.8 mmol/L (ref 3.5–5.1)
Sodium: 140 mmol/L (ref 135–145)
Total Bilirubin: 0.8 mg/dL (ref 0.3–1.2)
Total Protein: 6.7 g/dL (ref 6.5–8.1)

## 2019-12-03 LAB — BRAIN NATRIURETIC PEPTIDE: B Natriuretic Peptide: 64.1 pg/mL (ref 0.0–100.0)

## 2019-12-03 MED ORDER — FUROSEMIDE 10 MG/ML IJ SOLN
40.0000 mg | Freq: Once | INTRAMUSCULAR | Status: AC
  Administered 2019-12-03: 40 mg via INTRAVENOUS
  Filled 2019-12-03: qty 4

## 2019-12-03 MED ORDER — FUROSEMIDE 20 MG PO TABS
20.0000 mg | ORAL_TABLET | Freq: Every day | ORAL | 0 refills | Status: DC
Start: 2019-12-03 — End: 2020-05-01

## 2019-12-03 NOTE — ED Triage Notes (Signed)
Pt arriv ed via [facility] from [facility] r/t pt having altered presentation when he was brought home by [facility] from [facility] ED. PT c/o of right arm pain but is unable to give any history other then it is older then 1 month but less then 1 year. Provider at bedside

## 2019-12-03 NOTE — Discharge Instructions (Signed)
Take lasix 20 mg daily for 3 days for leg swelling. Your labs are normal today   See your doctor  Return to ER if he has trouble breathing, worse leg swelling or leg pain

## 2019-12-03 NOTE — ED Notes (Signed)
[facility] notified for transport 

## 2019-12-03 NOTE — ED Provider Notes (Signed)
[facility] EMERGENCY DEPARTMENT Provider Note   CSN: [id number] Arrival date & time: 12/03/19  2247   History Chief Complaint  Patient presents with   Altered Mental Status    Hayden [name] is a 79 y.o. male.  The history is provided by the patient, the EMS personnel and the nursing home. The history is limited by the condition of the patient (Dementia).  He has history of diabetes, hyperlipidemia, chronic lymphocytic leukemia, pulmonary embolism anticoagulated on rivaroxaban, dementia and is sent back from [facility] because of altered mental status.  He had been seen earlier today at [facility] emergency department with peripheral edema.  When he returned to [place], they noted that he had altered mental status compared with his baseline.  Apparently, this is one of the reasons why they had sent him to the emergency department initially.  He is also noted to be holding his arms straight up, and this is not normal for him.  Patient is not able to give any history whatsoever.  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   Patient attempts to grab and or grope male patients   Leukemia ([place]) chronic lymphocytic   2008 diagnosed-monitored Dr     Patient Active Problem List   Diagnosis Date Noted   Hospice care patient 05/19/2019   Acute pulmonary embolus ([place]) 05/14/2019   Acute pulmonary embolism ([place]) 05/14/2019   Pneumonia due to COVID-19 virus    Aspiration pneumonitis ([place]) 05/13/2019   Closed fracture of right hip ([place])    Femur fracture, right ([place]) 02/10/2019   Fall    Acute encephalopathy 07/02/2018   Dementia without behavioral disturbance ([place]) 05/24/2018   Pressure injury of skin 05/23/2018   Cellulitis  04/17/2018   Cellulitis, scrotum 03/09/2018   Cellulitis of male genitalia 03/08/2018   Depression 03/08/2018   BPH (benign prostatic hyperplasia) 03/08/2018   Leukocytosis 03/08/2018   Mild dementia ([place]) 10/23/2017   Lower urinary tract infectious disease 09/06/2017   Hyperglycemia 09/06/2017   ARF (acute renal failure) ([place]) 09/06/2017   Altered mental status 09/06/2017   Type 2 diabetes mellitus without complication, with long-term current use of insulin ([place]) 04/02/2017   Cubital tunnel syndrome on right 12/23/2016   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   Bilateral hand pain 12/09/2016   CLL (chronic lymphocytic leukemia) ([place]) 09/01/2013    Past Surgical History:  Procedure Laterality Date   ADENOIDECTOMY     CARPAL TUNNEL RELEASE     rt hand   CATARACT EXTRACTION, BILATERAL     CERVICAL FUSION  1998   CYSTOSCOPY  07/26/2012   Procedure: CYSTOSCOPY;  Surgeon: Ailene Rud, MD;  Location: [facility];  Service: Urology;  Laterality: N/A;  DIAGNOSTIC CYSTO PROSTATE ULTRASOUND       FEMUR IM NAIL Right 02/10/2019   Procedure: INTRAMEDULLARY (IM) NAIL FEMORAL;  Surgeon: Hiram Gash, MD;  Location: [facility];  Service: Orthopedics;  Laterality: Right;   PROSTATE BIOPSY  07/26/2012   Procedure: PROSTATE BIOPSY;  Surgeon: Ailene Rud, MD;  Location: [facility];  Service: Urology;  Laterality: N/A;   retinal micro aneurysms     tonsil     VASECTOMY     VITRECTOMY  Family History  Problem Relation Age of Onset   Diabetes Father    Diabetes Sister    Diabetes Brother     Social History   Tobacco Use   Smoking status: Never Smoker   Smokeless tobacco: Never Used  Substance Use Topics   Alcohol use: No   Drug use: No    Home Medications Prior to Admission medications   Medication Sig Start Date End Date Taking? Authorizing Provider  acetaminophen (TYLENOL) 325 MG tablet  Take 2 tablets (650 mg total) by mouth every 6 (six) hours as needed for mild pain (or Fever >/= 101). 05/19/19   Elgergawy, Silver Huguenin, MD  acetaminophen (TYLENOL) 500 MG tablet Take 500 mg by mouth every 4 (four) hours as needed for moderate pain.    [provider]  amLODipine (NORVASC) 10 MG tablet Take 1 tablet (10 mg total) by mouth daily. 05/20/19   Elgergawy, Silver Huguenin, MD  celecoxib (CELEBREX) 200 MG capsule Take 200 mg by mouth 2 (two) times daily.    [provider]  Docusate Sodium 100 MG capsule Take 100 mg by mouth daily.    [provider]  donepezil (ARICEPT) 10 MG tablet Take 1 tablet (10 mg total) by mouth at bedtime. 05/03/18   Cameron Sprang, MD  EPINEPHrine (EPIPEN JR) 0.15 MG/0.3ML injection Inject 0.15 mg into the muscle as needed for anaphylaxis.    [provider]  Ergocalciferol (VITAMIN D2 PO) Take 1 capsule by mouth daily.    [provider]  feeding supplement, GLUCERNA SHAKE, (GLUCERNA SHAKE) LIQD Take 237 mLs by mouth 3 (three) times daily between meals. Patient not taking: Reported on 07/04/2019 05/19/19   Elgergawy, Silver Huguenin, MD  furosemide (LASIX) 20 MG tablet Take 1 tablet (20 mg total) by mouth daily. 12/03/19   Drenda Freeze, MD  guaiFENesin (ROBITUSSIN) 100 MG/5ML liquid Take 200 mg by mouth every 6 (six) hours as needed for cough.    [provider]  haloperidol (HALDOL) 5 MG tablet Take 5 mg by mouth every 8 (eight) hours as needed for agitation.    [provider]  insulin lispro (HUMALOG) 100 UNIT/ML KwikPen Inject 6 Units into the skin 3 (three) times daily with meals. 10/11/18   [provider]  LEVEMIR FLEXTOUCH 100 UNIT/ML Pen Inject 24 Units into the skin at bedtime. Patient taking differently: Inject 20 Units into the skin at bedtime.  02/16/19   Eugenie Filler, MD  loperamide (IMODIUM) 2 MG capsule Take 2 mg by mouth See admin instructions. Take 2 mg by mouth with each loose stool  as needed for diarrhea (do not exceed 8 doses in 24 hours)    [provider]  lovastatin (MEVACOR) 20 MG tablet Take 20 mg by mouth daily.    [provider]  magnesium hydroxide (MILK OF MAGNESIA) 400 MG/5ML suspension Take 30 mLs by mouth at bedtime as needed for mild constipation.    [provider]  neomycin-bacitracin-polymyxin (NEOSPORIN) [phone] ointment Apply 1 application topically See admin instructions. *Standing order for skin tear, abrasions* Clean area with normal saline. Apply tao, cover with bandaid or gauze and tape. Change as needed until healed.    [provider]  nystatin cream (MYCOSTATIN) Apply to affected area 2 times daily Patient not taking: Reported on 12/03/2019 04/23/18   [name], PA-C  omeprazole (PRILOSEC) 20 MG capsule Take 1 capsule (20 mg total) by mouth daily. 05/11/19   Blanchie Dessert, MD  oxyCODONE (OXY IR/ROXICODONE) 5 MG immediate release tablet Take 0.5 tablets (2.5 mg total) by mouth every 4 (four) hours as needed for severe pain. Patient not taking: Reported on 12/03/2019 05/19/19   Elgergawy, Silver Huguenin, MD  risperiDONE (RISPERDAL) 2 MG tablet Take 2 mg by mouth 2 (two) times daily.    [provider]  rivaroxaban (XARELTO) 20 MG TABS tablet Take 1 tablet (20 mg total) by mouth daily with supper. Patient will be on Xarelto loading dose until 06/07/2019, then he will be transitioned to this dose Xarelto 20 mg p.o. once daily on 06/08/2019 06/08/19   Elgergawy, Silver Huguenin, MD  sertraline (ZOLOFT) 25 MG tablet Take 25 mg by mouth daily.     [provider]  tamsulosin (FLOMAX) 0.4 MG CAPS capsule Take 0.4 mg by mouth daily.     [provider]  traZODone (DESYREL) 50 MG tablet Take 50 mg by mouth every 8 (eight) hours as needed (agitation).    [provider]  calcium citrate-vitamin D (CITRACAL+D) 315-200 MG-UNIT per tablet Take 1 tablet by mouth daily.  09/04/11  [provider]    terazosin (HYTRIN) 5 MG capsule Take 5 mg by mouth at bedtime.  09/04/11  [provider]    Allergies    Bee venom, Glucophage [metformin], Shellfish allergy, Grass extracts [gramineae pollens], and Tobacco [tobacco]  Review of Systems   Review of Systems  Unable to perform ROS: Dementia    Physical Exam Updated Vital Signs BP (!) 133/56    Pulse 81    Temp 98.2 F (36.8 C) (Oral)    Resp 18    SpO2 99%   Physical Exam Vitals and nursing note reviewed.   79 year old male, resting comfortably and in no acute distress. Vital signs are normal. Oxygen saturation is 99%, which is normal. Head is normocephalic and atraumatic. PERRLA, EOMI. Oropharynx is clear. Neck is nontender and supple without adenopathy or JVD.  There are no carotid bruits. Back is nontender and there is no CVA tenderness. Lungs are clear without rales, wheezes, or rhonchi. Chest is nontender. Heart has regular rate and rhythm without murmur. Abdomen is soft, flat, nontender without masses or hepatosplenomegaly and peristalsis is normoactive. Extremities have 2+ edema. Skin is warm and dry without rash. Neurologic: Awake.  Answers simple questions but is slow to respond.  Speaks in a monotone.  Masklike facies present.  Oriented to person but not place or time.  Cranial nerves are intact.  Marked increased muscle tone diffusely with moderate to severe cogwheel rigidity.  ED Results / Procedures / Treatments   Labs (all labs ordered are listed, but only abnormal results are displayed) Labs Reviewed  COMPREHENSIVE METABOLIC PANEL - Abnormal; Notable for the following components:      Result Value   Potassium 3.1 (*)    Total Protein 6.1 (*)    Total Bilirubin 1.5 (*)    All other components within normal limits  CBC WITH DIFFERENTIAL/PLATELET - Abnormal; Notable for the following components:   WBC 11.6 (*)    All other components within normal limits  CULTURE, BLOOD (ROUTINE X 2)  CULTURE, BLOOD  (ROUTINE X 2)  SARS CORONAVIRUS 2 BY RT PCR (HOSPITAL ORDER, [place] [facility])  AMMONIA  LACTIC ACID, PLASMA  URINALYSIS, ROUTINE W REFLEX MICROSCOPIC  LACTIC ACID, PLASMA  CBG MONITORING, ED    Radiology DG Chest 1 View  Result Date: 12/03/2019 CLINICAL DATA:  Altered level of  consciousness EXAM: CHEST  1 VIEW COMPARISON:  05/14/2019 FINDINGS: Single frontal view of the chest demonstrates an unremarkable cardiac silhouette. There is patchy consolidation at the lateral right lung base, with small right pleural effusion. No pneumothorax. Left chest is clear. No acute bony abnormalities. IMPRESSION: 1. Patchy right basilar consolidation consistent with pneumonia. 2. Trace right pleural effusion. Electronically Signed   By: Randa Ngo M.D.   On: 12/03/2019 23:34   CT Head Wo Contrast  Result Date: 12/03/2019 CLINICAL DATA:  Altered mental status. EXAM: CT HEAD WITHOUT CONTRAST TECHNIQUE: Contiguous axial images were obtained from the base of the skull through the vertex without intravenous contrast. COMPARISON:  May 11, 2019 FINDINGS: Brain: There is mild cerebral atrophy with widening of the extra-axial spaces and ventricular dilatation. There are areas of decreased attenuation within the white matter tracts of the supratentorial brain, consistent with microvascular disease changes. Vascular: No hyperdense vessel or unexpected calcification. Skull: Normal. Negative for fracture or focal lesion. Sinuses/Orbits: No acute finding. Other: None. IMPRESSION: 1. Generalized cerebral atrophy. 2. No acute intracranial abnormality. Electronically Signed   By: Virgina Norfolk M.D.   On: 12/03/2019 23:48    Procedures Procedures   Medications Ordered in ED Medications  cefTRIAXone (ROCEPHIN) 2 g in sodium chloride 0.9 % 100 mL IVPB (2 g Intravenous New Bag/Given 12/04/19 0119)  azithromycin (ZITHROMAX) 500 mg in sodium chloride 0.9 % 250 mL IVPB (has no administration in time  range)    ED Course  I have reviewed the triage vital signs and the nursing notes.  Pertinent labs & imaging results that were available during my care of the patient were reviewed by me and considered in my medical decision making (see chart for details).  MDM Rules/Calculators/A&P Altered mental status but on the baseline of severe dementia.  Complaint from nursing home of arm sticking up appears to be muscle rigidity related to his underlying Parkinson's disease.  I do not see any anti-Parkinson's medications on his medication list.  Will check for occult infection and also check CT of head to look for occult bleed.  Old records reviewed confirming ED visit earlier today at which time lab work-up was unremarkable including urinalysis without evidence of infection.  12:14 AM Chest x-ray appears to show an infiltrate in the right base consistent with pneumonia.  He will be started on antibiotics for community-acquired pneumonia, will also screen for occult sepsis with lactic acid leve`l.  1:43 AM Lactic acid level is normal.  Case is discussed with Dr. Marlowe Sax, on-call for [facility] hospitalist, who agrees to admit the patient.  Final Clinical Impression(s) / ED Diagnoses Final diagnoses:  Community acquired pneumonia of right lower lobe of lung  Altered mental status, unspecified altered mental status type  Muscle stiffness    Rx / DC Orders ED Discharge Orders    None       Delora Fuel, MD 32/99/24 0144

## 2019-12-03 NOTE — ED Triage Notes (Signed)
EMS reports from [facility], called out for bilateral leg edema that began today. Hx of Dementia.  BP 150 HR 80 RR 18 Sp02 98 RA Temp 97.2 CBG 120

## 2019-12-03 NOTE — ED Provider Notes (Signed)
[place] DEPT Provider Note   CSN: [id number] Arrival date & time: 12/03/19  1722     History Chief Complaint  Patient presents with  . Leg Swelling    Hayden [name] is a 79 y.o. male hx of DM, dementia, depression, HL, here presenting with leg swelling. Patient is from [place]. Patient has hx of dementia and was noted to have bilateral leg swelling. Patient unable to give history. Denies any chest pain or shortness of breath.  Patient has no complaints. Patient is not on diuretics per nursing home. Patient has DNR in the chart.   The history is provided by the EMS personnel and the nursing home.       Past Medical History:  Diagnosis Date  . Candidiasis   . Depression   . Diabetes mellitus   . Enlarged prostate   . Fibromyalgia   . GERD (gastroesophageal reflux disease)    reflux intermittent  . Hyperlipidemia   . 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 Leukemia [facility]) chronic lymphocytic   2008 diagnosed-monitored Dr     Patient Active Problem List   Diagnosis Date Noted  . Hospice care patient 05/19/2019  . Acute pulmonary embolus ([place]) 05/14/2019  . Acute pulmonary embolism ([place]) 05/14/2019  . Pneumonia due to COVID-19 virus   . Aspiration pneumonitis ([place]) 05/13/2019  . Closed fracture of right hip ([place])   . Femur fracture, right ([place]) 02/10/2019  . Fall   . Acute encephalopathy 07/02/2018  . Dementia without behavioral disturbance ([place]) 05/24/2018  . Pressure injury of skin 05/23/2018  . Cellulitis 04/17/2018  . Cellulitis, scrotum 03/09/2018  . Cellulitis of male genitalia 03/08/2018  . Depression 03/08/2018  . BPH (benign prostatic hyperplasia) 03/08/2018  . Leukocytosis 03/08/2018  . Mild dementia ([place]) 10/23/2017  . Lower urinary tract infectious disease 09/06/2017  . Hyperglycemia 09/06/2017  . ARF (acute renal failure) ([place]) 09/06/2017  . Altered mental status  09/06/2017  . Type 2 diabetes mellitus without complication, with long-term current use of insulin ([place]) 04/02/2017  . 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 Bilateral hand pain 12/09/2016  . CLL (chronic lymphocytic leukemia) ([place]) 09/01/2013    Past Surgical History:  Procedure Laterality Date  . ADENOIDECTOMY    . CARPAL TUNNEL RELEASE     rt hand  . CATARACT EXTRACTION, BILATERAL    . CERVICAL FUSION  1998  . CYSTOSCOPY  07/26/2012   Procedure: CYSTOSCOPY;  Surgeon: Ailene Rud, MD;  Location: [facility];  Service: Urology;  Laterality: N/A;  DIAGNOSTIC CYSTO PROSTATE ULTRASOUND      . FEMUR IM NAIL Right 02/10/2019   Procedure: INTRAMEDULLARY (IM) NAIL FEMORAL;  Surgeon: Hiram Gash, MD;  Location: [facility];  Service: Orthopedics;  Laterality: Right;  . PROSTATE BIOPSY  07/26/2012   Procedure: PROSTATE BIOPSY;  Surgeon: Ailene Rud, MD;  Location: [facility];  Service: Urology;  Laterality: N/A;  . retinal micro aneurysms    . tonsil    . VASECTOMY    . VITRECTOMY         Family History  Problem Relation Age of Onset  . Diabetes Father   . Diabetes Sister   . Diabetes Brother     Social History   Tobacco Use  . Smoking status: Never Smoker  . Smokeless tobacco: Never Used  Substance Use Topics  .  Alcohol use: No  . Drug use: No    Home Medications Prior to Admission medications   Medication Sig Start Date End Date Taking? Authorizing Provider  acetaminophen (TYLENOL) 325 MG tablet Take 2 tablets (650 mg total) by mouth every 6 (six) hours as needed for mild pain (or Fever >/= 101). 05/19/19  Yes Elgergawy, Silver Huguenin, MD  acetaminophen (TYLENOL) 500 MG tablet Take 500 mg by mouth every 4 (four) hours as needed for moderate pain.   Yes [provider]  amLODipine (NORVASC) 10 MG tablet Take 1 tablet (10 mg total) by mouth daily. 05/20/19  Yes  Elgergawy, Silver Huguenin, MD  celecoxib (CELEBREX) 200 MG capsule Take 200 mg by mouth 2 (two) times daily.   Yes [provider]  Docusate Sodium 100 MG capsule Take 100 mg by mouth daily.   Yes [provider]  donepezil (ARICEPT) 10 MG tablet Take 1 tablet (10 mg total) by mouth at bedtime. 05/03/18  Yes Cameron Sprang, MD  EPINEPHrine [facility] JR) 0.15 MG/0.3ML injection Inject 0.15 mg into the muscle as needed for anaphylaxis.   Yes [provider]  Ergocalciferol (VITAMIN D2 PO) Take 1 capsule by mouth daily.   Yes [provider]  guaiFENesin (ROBITUSSIN) 100 MG/5ML liquid Take 200 mg by mouth every 6 (six) hours as needed for cough.   Yes [provider]  haloperidol (HALDOL) 5 MG tablet Take 5 mg by mouth every 8 (eight) hours as needed for agitation.   Yes [provider]  insulin lispro (HUMALOG) 100 UNIT/ML KwikPen Inject 6 Units into the skin 3 (three) times daily with meals. 10/11/18  Yes [provider]  LEVEMIR FLEXTOUCH 100 UNIT/ML Pen Inject 24 Units into the skin at bedtime. Patient taking differently: Inject 20 Units into the skin at bedtime.  02/16/19  Yes Eugenie Filler, MD  loperamide (IMODIUM) 2 MG capsule Take 2 mg by mouth See admin instructions. Take 2 mg by mouth with each loose stool as needed for diarrhea (do not exceed 8 doses in 24 hours)   Yes [provider]  lovastatin (MEVACOR) 20 MG tablet Take 20 mg by mouth daily.   Yes [provider]  magnesium hydroxide (MILK OF MAGNESIA) 400 MG/5ML suspension Take 30 mLs by mouth at bedtime as needed for mild constipation.   Yes [provider]  neomycin-bacitracin-polymyxin (NEOSPORIN) [phone] ointment Apply 1 application topically See admin instructions. *Standing order for skin tear, abrasions* Clean area with normal saline. Apply tao, cover with bandaid or gauze and tape. Change as needed until healed.   Yes [provider]    omeprazole (PRILOSEC) 20 MG capsule Take 1 capsule (20 mg total) by mouth daily. 05/11/19  Yes Plunkett, Loree Fee, MD  risperiDONE (RISPERDAL) 2 MG tablet Take 2 mg by mouth 2 (two) times daily.   Yes [provider]  rivaroxaban (XARELTO) 20 MG TABS tablet Take 1 tablet (20 mg total) by mouth daily with supper. Patient will be on Xarelto loading dose until 06/07/2019, then he will be transitioned to this dose Xarelto 20 mg p.o. once daily on 06/08/2019 06/08/19  Yes Elgergawy, Silver Huguenin, MD  sertraline (ZOLOFT) 25 MG tablet Take 25 mg by mouth daily.    Yes [provider]  tamsulosin (FLOMAX) 0.4 MG CAPS capsule Take 0.4 mg by mouth daily.    Yes [provider]  traZODone (DESYREL) 50 MG tablet Take 50 mg by mouth every 8 (eight) hours as needed (  agitation).   Yes [provider]  feeding supplement, GLUCERNA SHAKE, (GLUCERNA SHAKE) LIQD Take 237 mLs by mouth 3 (three) times daily between meals. Patient not taking: Reported on 07/04/2019 05/19/19   Elgergawy, Silver Huguenin, MD  nystatin cream (MYCOSTATIN) Apply to affected area 2 times daily Patient not taking: Reported on 12/03/2019 04/23/18   [name], PA-C  oxyCODONE (OXY IR/ROXICODONE) 5 MG immediate release tablet Take 0.5 tablets (2.5 mg total) by mouth every 4 (four) hours as needed for severe pain. Patient not taking: Reported on 12/03/2019 05/19/19   Elgergawy, Silver Huguenin, MD  calcium citrate-vitamin D (CITRACAL+D) 315-200 MG-UNIT per tablet Take 1 tablet by mouth daily.  09/04/11  [provider]  terazosin (HYTRIN) 5 MG capsule Take 5 mg by mouth at bedtime.  09/04/11  [provider]    Allergies    Bee venom, Glucophage [metformin], Shellfish allergy, Grass extracts [gramineae pollens], and Tobacco [tobacco]  Review of Systems   Review of Systems  Cardiovascular: Positive for leg swelling.  All other systems reviewed and are negative.   Physical Exam Updated Vital Signs BP (!) 157/83    Pulse 98   Temp 97.9 F (36.6 C) (Oral)   Resp 16   SpO2 100%   Physical Exam Vitals and nursing note reviewed.  HENT:     Head: Normocephalic.     Nose: Nose normal.     Mouth/Throat:     Mouth: Mucous membranes are moist.  Eyes:     Extraocular Movements: Extraocular movements intact.     Pupils: Pupils are equal, round, and reactive to light.  Cardiovascular:     Rate and Rhythm: Normal rate and regular rhythm.     Pulses: Normal pulses.  Pulmonary:     Effort: Pulmonary effort is normal.     Breath sounds: Normal breath sounds.  Abdominal:     General: Abdomen is flat.     Palpations: Abdomen is soft.  Musculoskeletal:        General: Normal range of motion.     Cervical back: Normal range of motion.     Comments: 1+ edema bilaterally, no signs of cellulitis   Skin:    General: Skin is warm.     Capillary Refill: Capillary refill takes less than 2 seconds.  Neurological:     General: No focal deficit present.     Mental Status: He is alert.  Psychiatric:        Mood and Affect: Mood normal.     ED Results / Procedures / Treatments   Labs (all labs ordered are listed, but only abnormal results are displayed) Labs Reviewed  CBC WITH DIFFERENTIAL/PLATELET - Abnormal; Notable for the following components:      Result Value   WBC 11.9 (*)    Lymphs Abs 6.1 (*)    All other components within normal limits  COMPREHENSIVE METABOLIC PANEL - Abnormal; Notable for the following components:   Glucose, Bld 127 (*)    All other components within normal limits  URINALYSIS, ROUTINE W REFLEX MICROSCOPIC - Abnormal; Notable for the following components:   Glucose, UA >=500 (*)    All other components within normal limits  BRAIN NATRIURETIC PEPTIDE    EKG None  Radiology No results found.  Procedures Procedures (including critical care time)  Medications Ordered in ED Medications  furosemide (LASIX) injection 40 mg (40 mg Intravenous Given 12/03/19 2032)    ED  Course  I have reviewed the triage vital  signs and the nursing notes.  Pertinent labs & imaging results that were available during my care of the patient were reviewed by me and considered in my medical decision making (see chart for details).    MDM Rules/Calculators/A&P                      Hayden [name] is a 79 y.o. male here presenting with bilateral leg edema.  Patient is demented at baseline.  Patient does have 1+ pitting edema.  Consider renal failure versus dependent edema.  Unclear if he has shortness of breath or not.  Also consider CHF.  Will get CBC, CMP, BNP.  If his creatinine is normal, consider some diuresis.  8:52 PM Labs unremarkable. UA unremarkable. BNP normal.  Given Lasix in the ED.  Will discharge back to facility with Lasix 20 mg for 3 days.   Final Clinical Impression(s) / ED Diagnoses Final diagnoses:  None    Rx / DC Orders ED Discharge Orders    None       Drenda Freeze, MD 12/03/19 2053

## 2019-12-04 DIAGNOSIS — G20 Parkinson's disease: Secondary | ICD-10-CM | POA: Diagnosis present

## 2019-12-04 DIAGNOSIS — Z981 Arthrodesis status: Secondary | ICD-10-CM | POA: Diagnosis not present

## 2019-12-04 DIAGNOSIS — E119 Type 2 diabetes mellitus without complications: Secondary | ICD-10-CM | POA: Diagnosis present

## 2019-12-04 DIAGNOSIS — C9110 Chronic lymphocytic leukemia of B-cell type not having achieved remission: Secondary | ICD-10-CM

## 2019-12-04 DIAGNOSIS — M797 Fibromyalgia: Secondary | ICD-10-CM | POA: Diagnosis present

## 2019-12-04 DIAGNOSIS — Z91013 Allergy to seafood: Secondary | ICD-10-CM | POA: Diagnosis not present

## 2019-12-04 DIAGNOSIS — G9341 Metabolic encephalopathy: Secondary | ICD-10-CM | POA: Diagnosis present

## 2019-12-04 DIAGNOSIS — Z86711 Personal history of pulmonary embolism: Secondary | ICD-10-CM | POA: Diagnosis not present

## 2019-12-04 DIAGNOSIS — K219 Gastro-esophageal reflux disease without esophagitis: Secondary | ICD-10-CM | POA: Diagnosis present

## 2019-12-04 DIAGNOSIS — Z66 Do not resuscitate: Secondary | ICD-10-CM | POA: Diagnosis present

## 2019-12-04 DIAGNOSIS — R0602 Shortness of breath: Secondary | ICD-10-CM | POA: Diagnosis present

## 2019-12-04 DIAGNOSIS — F0391 Unspecified dementia with behavioral disturbance: Secondary | ICD-10-CM | POA: Diagnosis not present

## 2019-12-04 DIAGNOSIS — Z794 Long term (current) use of insulin: Secondary | ICD-10-CM | POA: Diagnosis not present

## 2019-12-04 DIAGNOSIS — J189 Pneumonia, unspecified organism: Secondary | ICD-10-CM | POA: Diagnosis not present

## 2019-12-04 DIAGNOSIS — R410 Disorientation, unspecified: Secondary | ICD-10-CM | POA: Diagnosis not present

## 2019-12-04 DIAGNOSIS — Z7901 Long term (current) use of anticoagulants: Secondary | ICD-10-CM | POA: Diagnosis not present

## 2019-12-04 DIAGNOSIS — I10 Essential (primary) hypertension: Secondary | ICD-10-CM | POA: Diagnosis present

## 2019-12-04 DIAGNOSIS — N400 Enlarged prostate without lower urinary tract symptoms: Secondary | ICD-10-CM | POA: Diagnosis present

## 2019-12-04 DIAGNOSIS — Z8616 Personal history of COVID-19: Secondary | ICD-10-CM | POA: Diagnosis not present

## 2019-12-04 DIAGNOSIS — F0390 Unspecified dementia without behavioral disturbance: Secondary | ICD-10-CM | POA: Diagnosis present

## 2019-12-04 DIAGNOSIS — C9111 Chronic lymphocytic leukemia of B-cell type in remission: Secondary | ICD-10-CM | POA: Diagnosis present

## 2019-12-04 DIAGNOSIS — Z79899 Other long term (current) drug therapy: Secondary | ICD-10-CM | POA: Diagnosis not present

## 2019-12-04 DIAGNOSIS — E785 Hyperlipidemia, unspecified: Secondary | ICD-10-CM | POA: Diagnosis present

## 2019-12-04 DIAGNOSIS — J690 Pneumonitis due to inhalation of food and vomit: Secondary | ICD-10-CM | POA: Diagnosis present

## 2019-12-04 LAB — CBC WITH DIFFERENTIAL/PLATELET
Abs Immature Granulocytes: 0.03 10*3/uL (ref 0.00–0.07)
Basophils Absolute: 0 10*3/uL (ref 0.0–0.1)
Basophils Relative: 0 %
Eosinophils Absolute: 0.1 10*3/uL (ref 0.0–0.5)
Eosinophils Relative: 1 %
HCT: 40.1 % (ref 39.0–52.0)
Hemoglobin: 13.5 g/dL (ref 13.0–17.0)
Immature Granulocytes: 0 %
Lymphocytes Relative: 50 %
Lymphs Abs: 5.8 10*3/uL — ABNORMAL HIGH (ref 0.7–4.0)
MCH: 31 pg (ref 26.0–34.0)
MCHC: 33.7 g/dL (ref 30.0–36.0)
MCV: 92 fL (ref 80.0–100.0)
Monocytes Absolute: 0.4 10*3/uL (ref 0.1–1.0)
Monocytes Relative: 3 %
Neutro Abs: 5.3 10*3/uL (ref 1.7–7.7)
Neutrophils Relative %: 46 %
Platelets: 234 10*3/uL (ref 150–400)
RBC: 4.36 MIL/uL (ref 4.22–5.81)
RDW: 13.4 % (ref 11.5–15.5)
WBC: 11.6 10*3/uL — ABNORMAL HIGH (ref 4.0–10.5)
nRBC: 0 % (ref 0.0–0.2)

## 2019-12-04 LAB — AMMONIA: Ammonia: 31 umol/L (ref 9–35)

## 2019-12-04 LAB — GLUCOSE, CAPILLARY
Glucose-Capillary: 96 mg/dL (ref 70–99)
Glucose-Capillary: 115 mg/dL — ABNORMAL HIGH (ref 70–99)
Glucose-Capillary: 162 mg/dL — ABNORMAL HIGH (ref 70–99)

## 2019-12-04 LAB — CBG MONITORING, ED: Glucose-Capillary: 95 mg/dL (ref 70–99)

## 2019-12-04 LAB — URINALYSIS, ROUTINE W REFLEX MICROSCOPIC
Bilirubin Urine: NEGATIVE
Glucose, UA: 150 mg/dL — AB
Hgb urine dipstick: NEGATIVE
Ketones, ur: NEGATIVE mg/dL
Leukocytes,Ua: NEGATIVE
Nitrite: NEGATIVE
Protein, ur: NEGATIVE mg/dL
Specific Gravity, Urine: 1.011 (ref 1.005–1.030)
pH: 8 (ref 5.0–8.0)

## 2019-12-04 LAB — COMPREHENSIVE METABOLIC PANEL
ALT: 25 U/L (ref 0–44)
AST: 36 U/L (ref 15–41)
Albumin: 4.1 g/dL (ref 3.5–5.0)
Alkaline Phosphatase: 87 U/L (ref 38–126)
Anion gap: 15 (ref 5–15)
BUN: 15 mg/dL (ref 8–23)
CO2: 23 mmol/L (ref 22–32)
Calcium: 9.5 mg/dL (ref 8.9–10.3)
Chloride: 103 mmol/L (ref 98–111)
Creatinine, Ser: 0.83 mg/dL (ref 0.61–1.24)
GFR calc Af Amer: >60 mL/min (ref >60)
GFR calc non Af Amer: >60 mL/min (ref >60)
Glucose, Bld: 99 mg/dL (ref 70–99)
Potassium: 3.1 mmol/L — ABNORMAL LOW (ref 3.5–5.1)
Sodium: 141 mmol/L (ref 135–145)
Total Bilirubin: 1.5 mg/dL — ABNORMAL HIGH (ref 0.3–1.2)
Total Protein: 6.1 g/dL — ABNORMAL LOW (ref 6.5–8.1)

## 2019-12-04 LAB — PROCALCITONIN: Procalcitonin: <0.1 ng/mL

## 2019-12-04 LAB — LACTIC ACID, PLASMA
Lactic Acid, Venous: 1.8 mmol/L (ref 0.5–1.9)
Lactic Acid, Venous: 1.4 mmol/L (ref 0.5–1.9)

## 2019-12-04 LAB — SARS CORONAVIRUS 2 BY RT PCR (HOSPITAL ORDER, PERFORMED IN ~~LOC~~ HOSPITAL LAB): SARS Coronavirus 2: NEGATIVE

## 2019-12-04 MED ORDER — TRAZODONE HCL 50 MG PO TABS: 50.0000 mg | ORAL_TABLET | Freq: Three times a day (TID) | ORAL | Status: DC | PRN

## 2019-12-04 MED ORDER — DONEPEZIL HCL 10 MG PO TABS
10.0000 mg | ORAL_TABLET | Freq: Every day | ORAL | Status: DC
  Administered 2019-12-04 – 2019-12-06 (×3): 10 mg via ORAL
  Filled 2019-12-04 (×3): qty 1

## 2019-12-04 MED ORDER — INSULIN DETEMIR 100 UNIT/ML ~~LOC~~ SOLN
20.0000 [IU] | Freq: Every day | SUBCUTANEOUS | Status: DC
  Administered 2019-12-05 – 2019-12-06 (×2): 20 [IU] via SUBCUTANEOUS
  Filled 2019-12-04 (×3): qty 0.2

## 2019-12-04 MED ORDER — INSULIN ASPART 100 UNIT/ML ~~LOC~~ SOLN
0.0000 [IU] | Freq: Every day | SUBCUTANEOUS | Status: DC
  Administered 2019-12-05 – 2019-12-06 (×2): 2 [IU] via SUBCUTANEOUS

## 2019-12-04 MED ORDER — SODIUM CHLORIDE 0.9 % IV SOLN
2.0000 g | Freq: Every day | INTRAVENOUS | Status: DC
  Administered 2019-12-04 – 2019-12-06 (×4): 2 g via INTRAVENOUS
  Filled 2019-12-04 (×2): qty 2
  Filled 2019-12-04 (×2): qty 20

## 2019-12-04 MED ORDER — HALOPERIDOL 1 MG PO TABS: 5.0000 mg | ORAL_TABLET | Freq: Three times a day (TID) | ORAL | Status: DC | PRN

## 2019-12-04 MED ORDER — RIVAROXABAN 20 MG PO TABS
20.0000 mg | ORAL_TABLET | Freq: Every day | ORAL | Status: DC
  Administered 2019-12-04 – 2019-12-06 (×3): 20 mg via ORAL
  Filled 2019-12-04 (×3): qty 1

## 2019-12-04 MED ORDER — RISPERIDONE 2 MG PO TABS
2.0000 mg | ORAL_TABLET | Freq: Two times a day (BID) | ORAL | Status: DC
  Administered 2019-12-04 – 2019-12-06 (×6): 2 mg via ORAL
  Filled 2019-12-04 (×6): qty 1

## 2019-12-04 MED ORDER — AMLODIPINE BESYLATE 10 MG PO TABS
10.0000 mg | ORAL_TABLET | Freq: Every day | ORAL | Status: DC
  Administered 2019-12-04 – 2019-12-06 (×3): 10 mg via ORAL
  Filled 2019-12-04 (×3): qty 1

## 2019-12-04 MED ORDER — SODIUM CHLORIDE 0.9 % IV SOLN: INTRAVENOUS | Status: DC

## 2019-12-04 MED ORDER — SERTRALINE HCL 25 MG PO TABS
25.0000 mg | ORAL_TABLET | Freq: Every day | ORAL | Status: DC
  Administered 2019-12-04 – 2019-12-06 (×3): 25 mg via ORAL
  Filled 2019-12-04 (×3): qty 1

## 2019-12-04 MED ORDER — INSULIN ASPART 100 UNIT/ML ~~LOC~~ SOLN
0.0000 [IU] | Freq: Three times a day (TID) | SUBCUTANEOUS | Status: DC
  Administered 2019-12-05 (×2): 3 [IU] via SUBCUTANEOUS
  Administered 2019-12-06: 2 [IU] via SUBCUTANEOUS

## 2019-12-04 MED ORDER — SODIUM CHLORIDE 0.9 % IV SOLN
500.0000 mg | Freq: Every day | INTRAVENOUS | Status: DC
  Administered 2019-12-04 – 2019-12-06 (×4): 500 mg via INTRAVENOUS
  Filled 2019-12-04 (×4): qty 500

## 2019-12-04 MED ORDER — TAMSULOSIN HCL 0.4 MG PO CAPS
0.4000 mg | ORAL_CAPSULE | Freq: Every day | ORAL | Status: DC
  Administered 2019-12-04 – 2019-12-06 (×3): 0.4 mg via ORAL
  Filled 2019-12-04 (×3): qty 1

## 2019-12-04 MED ORDER — PANTOPRAZOLE SODIUM 40 MG PO TBEC
40.0000 mg | DELAYED_RELEASE_TABLET | Freq: Every day | ORAL | Status: DC
  Administered 2019-12-04 – 2019-12-06 (×3): 40 mg via ORAL
  Filled 2019-12-04 (×3): qty 1

## 2019-12-04 MED ORDER — PRAVASTATIN SODIUM 10 MG PO TABS
20.0000 mg | ORAL_TABLET | Freq: Every day | ORAL | Status: DC
  Administered 2019-12-04 – 2019-12-06 (×3): 20 mg via ORAL
  Filled 2019-12-04 (×3): qty 2

## 2019-12-04 NOTE — H&P (Signed)
History and Physical    Hayden [name] GQQ:[id number] DOB: 08/18/1940 DOA: 12/03/2019  PCP: Patient, No Pcp Per Consultants:  Delice Lesch - neurology; Griffin Basil - orthopedics Patient coming from: [facility]; NOK: Elson Clan, Brother, [phone]; Christ Kick, [phone]  Chief Complaint:  AMS  HPI: Hayden [name] is a 79 y.o. male with medical history significant of CLL; HLD; dementia; and DM presenting with AMS.  He was seen in the ER earlier in the day due to LE edema and was given Lasix and discharged back to the facility.  When he returned back to the facility, they found him to be altered.  He is pleasant but unable to provide significant history.  He has echolalia and reports no current pain or obvious other concerns.  He is on room air and appears to be comfortable.  He is perhaps back to baseline.  I spoke with his brother - he has difficulty reaching him, uncertain what his baseline is.  He does have serious memory issues.  "There's a lot of pieces missing."  The facility called him to ask for permission to send him to the hospital - they reported "one rigid arm stuck up in the air, serious edema in his legs."  Also depressed and crying and respiratory issues.  The facility called him back and reported that "the ambulance had brought him back and they hadn't done anything and [place] didn't do anything."  He had a recent case of COVID.  He has asked me to speak with his sister, who is an Therapist, sports, [name], [phone].    ED Course:  Carryover, per Dr. Marlowe Sax:  Patient is presenting from his nursing home for evaluation of altered mental status. He was seen in the ED earlier today for peripheral edema and given Lasix but after returning to the nursing home they noticed an acute change in his mental status. He has baseline dementia. They noticed that he has been holding his arms up and ED provider feels there is rigidity. He feels that the patient might have Parkinson's but is  not on any medications for this condition. His head CT was negative. Work-up revealed right lower lobe pneumonia. Lactate normal. Might need neurology consultation for further work-up of the rigidity.   Review of Systems: Unable to effectively perform   Past Medical History:  Diagnosis Date  . Candidiasis   . Depression   . Diabetes mellitus   . Enlarged prostate   . Fibromyalgia   . GERD (gastroesophageal reflux disease)    reflux intermittent  . Hyperlipidemia   . 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 Leukemia ([place]) chronic lymphocytic   2008 diagnosed-monitored Dr     Past Surgical History:  Procedure Laterality Date  . ADENOIDECTOMY    . CARPAL TUNNEL RELEASE     rt hand  . CATARACT EXTRACTION, BILATERAL    . CERVICAL FUSION  1998  . CYSTOSCOPY  07/26/2012   Procedure: CYSTOSCOPY;  Surgeon: Ailene Rud, MD;  Location: [facility];  Service: Urology;  Laterality: N/A;  DIAGNOSTIC CYSTO PROSTATE ULTRASOUND      . FEMUR IM NAIL Right 02/10/2019   Procedure: INTRAMEDULLARY (IM) NAIL FEMORAL;  Surgeon: Hiram Gash, MD;  Location: [facility];  Service: Orthopedics;  Laterality: Right;  . PROSTATE BIOPSY  07/26/2012   Procedure: PROSTATE BIOPSY;  Surgeon: Ailene Rud, MD;  Location: [facility];  Service: Urology;  Laterality:  N/A;  . retinal micro aneurysms    . tonsil    . VASECTOMY    . VITRECTOMY      Social History   Socioeconomic History  . Marital status: Married    Spouse name: Not on file  . Number of children: Not on file  . Years of education: Not on file  . Highest education level: Not on file  Occupational History  . Not on file  Tobacco Use  . Smoking status: Never Smoker  . Smokeless tobacco: Never Used  Substance and Sexual Activity  . Alcohol use: No  . Drug use: No  . Sexual activity: Not on file  Other Topics Concern  . Not on file  Social  History Narrative  . Not on file   Social Determinants of Health   Financial Resource Strain:   . Difficulty of Paying Living Expenses:   Food Insecurity:   . Worried About Charity fundraiser in the Last Year:   . Arboriculturist in the Last Year:   Transportation Needs:   . Film/video editor (Medical):   Marland Kitchen Lack of Transportation (Non-Medical):   Physical Activity:   . Days of Exercise per Week:   . Minutes of Exercise per Session:   Stress:   . Feeling of Stress :   Social Connections:   . Frequency of Communication with Friends and Family:   . Frequency of Social Gatherings with Friends and Family:   . Attends Religious Services:   . Active Member of Clubs or Organizations:   . Attends Archivist Meetings:   Marland Kitchen Marital Status:   Intimate Partner Violence:   . Fear of Current or Ex-Partner:   . Emotionally Abused:   Marland Kitchen Physically Abused:   . Sexually Abused:     Allergies  Allergen Reactions  . Bee Venom Other (See Comments)    Bump where stung and took days to go away  . Glucophage [Metformin] Diarrhea    "I will never take it again - stool incontinent"  . Shellfish Allergy Swelling  . Grass Extracts [Gramineae Pollens] Other (See Comments)    Unknown rxn  . Tobacco [Tobacco] Other (See Comments)    Unknown rxn    Family History  Problem Relation Age of Onset  . Diabetes Father   . Diabetes Sister   . Diabetes Brother     Prior to Admission medications   Medication Sig Start Date End Date Taking? Authorizing Provider  acetaminophen (TYLENOL) 325 MG tablet Take 2 tablets (650 mg total) by mouth every 6 (six) hours as needed for mild pain (or Fever >/= 101). 05/19/19  Yes Elgergawy, Silver Huguenin, MD  acetaminophen (TYLENOL) 500 MG tablet Take 500 mg by mouth every 4 (four) hours as needed for mild pain, fever or headache.   Yes [provider]  amLODipine (NORVASC) 10 MG tablet Take 1 tablet (10 mg total) by mouth daily. 05/20/19  Yes  Elgergawy, Silver Huguenin, MD  celecoxib (CELEBREX) 200 MG capsule Take 200 mg by mouth 2 (two) times daily.   Yes [provider]  Cholecalciferol 50 MCG (2000 UT) CAPS Take 1 capsule by mouth daily.   Yes [provider]  Docusate Sodium 100 MG capsule Take 100 mg by mouth daily.   Yes [provider]  donepezil (ARICEPT) 10 MG tablet Take 1 tablet (10 mg total) by mouth at bedtime. 05/03/18  Yes Cameron Sprang, MD  EPINEPHrine [facility]) 0.15  MG/0.3ML injection Inject 0.15 mg into the muscle as needed for anaphylaxis.   Yes [provider]  guaiFENesin (ROBITUSSIN) 100 MG/5ML liquid Take 200 mg by mouth every 6 (six) hours as needed for cough.   Yes [provider]  haloperidol (HALDOL) 5 MG tablet Take 5 mg by mouth every 8 (eight) hours as needed for agitation.   Yes [provider]  insulin lispro (HUMALOG) 100 UNIT/ML KwikPen Inject 6 Units into the skin 3 (three) times daily with meals. 10/11/18  Yes [provider]  LEVEMIR FLEXTOUCH 100 UNIT/ML Pen Inject 24 Units into the skin at bedtime. Patient taking differently: Inject 20 Units into the skin at bedtime.  02/16/19  Yes Eugenie Filler, MD  loperamide (IMODIUM) 2 MG capsule Take 2 mg by mouth See admin instructions. Take 2 mg by mouth with each loose stool as needed for diarrhea (do not exceed 8 doses in 24 hours)   Yes [provider]  lovastatin (MEVACOR) 20 MG tablet Take 20 mg by mouth daily.   Yes [provider]  magnesium hydroxide (MILK OF MAGNESIA) 400 MG/5ML suspension Take 30 mLs by mouth at bedtime as needed for mild constipation.   Yes [provider]  neomycin-bacitracin-polymyxin (NEOSPORIN) [phone] ointment Apply 1 application topically See admin instructions. *Standing order for skin tear, abrasions* Clean area with normal saline. Apply tao, cover with bandaid or gauze and tape. Change as needed until healed.   Yes [provider]  omeprazole (PRILOSEC) 20 MG capsule Take 1 capsule (20 mg total) by mouth daily. 05/11/19  Yes Plunkett, Loree Fee, MD  risperiDONE (RISPERDAL) 2 MG tablet Take 2 mg by mouth 2 (two) times daily.   Yes [provider]  rivaroxaban (XARELTO) 20 MG TABS tablet Take 1 tablet (20 mg total) by mouth daily with supper. Patient will be on Xarelto loading dose until 06/07/2019, then he will be transitioned to this dose Xarelto 20 mg p.o. once daily on 06/08/2019 06/08/19  Yes Elgergawy, Silver Huguenin, MD  sertraline (ZOLOFT) 25 MG tablet Take 25 mg by mouth daily.    Yes [provider]  tamsulosin (FLOMAX) 0.4 MG CAPS capsule Take 0.4 mg by mouth daily.    Yes [provider]  traZODone (DESYREL) 50 MG tablet Take 50 mg by mouth every 8 (eight) hours as needed (agitation).   Yes [provider]  feeding supplement, GLUCERNA SHAKE, (GLUCERNA SHAKE) LIQD Take 237 mLs by mouth 3 (three) times daily between meals. Patient not taking: Reported on 07/04/2019 05/19/19   Elgergawy, Silver Huguenin, MD  furosemide (LASIX) 20 MG tablet Take 1 tablet (20 mg total) by mouth daily. 12/03/19   Drenda Freeze, MD  nystatin cream (MYCOSTATIN) Apply to affected area 2 times daily Patient not taking: Reported on 12/03/2019 04/23/18   [name], PA-C  oxyCODONE (OXY IR/ROXICODONE) 5 MG immediate release tablet Take 0.5 tablets (2.5 mg total) by mouth every 4 (four) hours as needed for severe pain. Patient not taking: Reported on 12/03/2019 05/19/19   Elgergawy, Silver Huguenin, MD  calcium citrate-vitamin D (CITRACAL+D) 315-200 MG-UNIT per tablet Take 1 tablet by mouth daily.  09/04/11  [provider]  terazosin (HYTRIN) 5 MG capsule Take 5 mg by mouth at bedtime.  09/04/11  [provider]    Physical Exam: Vitals:   12/04/19 0805 12/04/19 1144 12/04/19 1200 12/04/19 1635  BP: (!) 112/57 (!) 115/57  (!) 109/55  Pulse: 82 80  77  Resp:  18 17  18   Temp: 98.4 F (36.9 C) 98.4 F  (36.9 C)  98.2 F (36.8 C)  TempSrc: Oral   Oral  SpO2: 98% 97%  98%  Weight:   61 kg   Height:   5\' 5"  (1.651 m)      . General:  Appears calm and comfortable and is NAD; he has apparent baseline cognitive impairment but smiles brightly and appropriately . Eyes:   EOMI, normal lids, iris . ENT:  grossly normal hearing, lips & tongue, mmm . Neck:  no LAD, masses or thyromegaly . Cardiovascular:  RRR, no m/r/g. No LE edema.  Marland Kitchen Respiratory:   CTA bilaterally with no wheezes/rales/rhonchi.  Normal respiratory effort. . Abdomen:  soft, NT, ND, NABS . Skin:  no rash or induration seen on limited exam . Musculoskeletal:  Somewhat rigid tone of BUE/BLE,  no bony abnormality . Psychiatric:  grossly normal mood and affect, speech with echolalia but able to answer questions somewhat appropriately, AOx1-2 (knew his name and that he was in a hospital) Neurologic:  CN 2-12 grossly intact on limited neurologic exam   Radiological Exams on Admission: DG Chest 1 View  Result Date: 12/03/2019 CLINICAL DATA:  Altered level of consciousness EXAM: CHEST  1 VIEW COMPARISON:  05/14/2019 FINDINGS: Single frontal view of the chest demonstrates an unremarkable cardiac silhouette. There is patchy consolidation at the lateral right lung base, with small right pleural effusion. No pneumothorax. Left chest is clear. No acute bony abnormalities. IMPRESSION: 1. Patchy right basilar consolidation consistent with pneumonia. 2. Trace right pleural effusion. Electronically Signed   By: Randa Ngo M.D.   On: 12/03/2019 23:34   CT Head Wo Contrast  Result Date: 12/03/2019 CLINICAL DATA:  Altered mental status. EXAM: CT HEAD WITHOUT CONTRAST TECHNIQUE: Contiguous axial images were obtained from the base of the skull through the vertex without intravenous contrast. COMPARISON:  May 11, 2019 FINDINGS: Brain: There is mild cerebral atrophy with widening of the extra-axial spaces and ventricular dilatation. There are  areas of decreased attenuation within the white matter tracts of the supratentorial brain, consistent with microvascular disease changes. Vascular: No hyperdense vessel or unexpected calcification. Skull: Normal. Negative for fracture or focal lesion. Sinuses/Orbits: No acute finding. Other: None. IMPRESSION: 1. Generalized cerebral atrophy. 2. No acute intracranial abnormality. Electronically Signed   By: Virgina Norfolk M.D.   On: 12/03/2019 23:48    EKG: not done   Labs on Admission: I have personally reviewed the available labs and imaging studies at the time of the admission.  Pertinent labs:   K+ 3.1 Lactate 1.8, 1.4 WBC 11.6 UA: 150 glucose COVID negative Blood cultures pending   Assessment/Plan Principal Problem:   Right lower lobe pneumonia Active Problems:   CLL (chronic lymphocytic leukemia) (HCC)   Type 2 diabetes mellitus without complication, with long-term current use of insulin (HCC)   Dementia without behavioral disturbance (HCC)   RLL PNA -Given mildly decreased oxygen saturation on admission and infiltrate in right lower lobe on chest x-ray, most likely community-acquired pneumonia.  -Other etiologies include aspiration versus URI; will keep NPO pending speech therapy swallow evaluation  -COVID-19 negative. -No evidence of sepsis -Will order lower respiratory tract procalcitonin level.  Antibiotics may not be indicated for PCT <0.1 and probably should not be used for < 0.25.  >0.5 indicates infection and >>0.5 indicates more serious disease.  As the procalcitonin level normalizes, it will be reasonable to consider de-escalation of antibiotic coverage.  The sensitivity  of procalcitonin is variable and should not be used alone to guide treatment. -CURB-65 score is 2 (confusion and age >52) - will admit the patient to Med Surg. -Pneumonia Severity Index (PSI) is Class 4, 9% mortality. -Will start Azithromycin 500 mg daily AND Rocephin due to no risk factors for  severe CAP. -NS @ 75cc/hr -Fever control -Repeat CBC in am  Dementia, AMS -Patient presenting with encephalopathy - his facility reported that he was different from baseline and he exhibited Parkinsonian traits without prior apparent diagnosis of such -While the patient does have underlying dementia, this is a change compared to his usual baseline mental status -At the time of my evaluation, he was back to what I presume to be his baseline - he was able to answer questions, smiled easily, and was oriented x 1-2 -His acute metabolic encephalopathy is suspected to be due to PNA, as above -The described Parkinsonism concerns were less evident at the time of my evaluation -Will continue to follow with treatment of CAP to see if there is ongoing improvement -Continue Aricept, Haldol, risperdal, Zoloft, Trazodone  H/o PE -Patient with COVID and PE in November -Continue Xarelto  CLL -Mild leukocytosis which may be associated with active infection -He does not appear to be following with oncology regularly, and does not appear to need to  DM -Continue Levemir -Cover with moderate-scale SSI  HTN -Continue Norvasc  HLD -Continue Mevacor (formulary substitution for Pravachol)   Note: This patient has been tested and is negative for the novel coronavirus COVID-19.  DVT prophylaxis:  Xarelto Code Status: DNR - confirmed with bedside paperwork Family Communication: None present; I spoke with the patient's brother and sister by telephone at the time of admission. Disposition Plan:  The patient is from: SNF  Anticipated d/c is to: SNF  Anticipated d/c date will depend on clinical response to treatment, likely 2-3 days  Patient is currently: acutely ill Consults called: PT/OT/ST Admission status:  Admit - It is my clinical opinion that admission to INPATIENT is reasonable and necessary because of the expectation that this patient will require hospital care that crosses at least 2 midnights  to treat this condition based on the medical complexity of the problems presented.  Given the aforementioned information, the predictability of an adverse outcome is felt to be significant.    Karmen Bongo MD [facility]   How to contact the [facility] Attending or Consulting provider [place] or covering provider during after hours [place], for this patient?  1. Check the care team in [facility] and look for a) attending/consulting TRH provider listed and b) the [facility] team listed 2. Log into [email or web address] and use [place]'s universal password to access. If you do not have the password, please contact the hospital operator. 3. Locate the [facility] provider you are looking for under [facility] and page to a number that you can be directly reached. 4. If you still have difficulty reaching the provider, please page the [facility] (Director on Call) for the Hospitalists listed on amion for assistance.   12/04/2019, 5:41 PM

## 2019-12-04 NOTE — Progress Notes (Signed)
Notified on call physician of pt NPO status with 20 units of Levemir to be given- new orders to hold Levemir while pt is NPO-same done.

## 2019-12-04 NOTE — ED Notes (Signed)
Pt found to be in mid 80s SpO2. Pt placed on 2L. O2 now 99%

## 2019-12-05 ENCOUNTER — Other Ambulatory Visit: Payer: Self-pay

## 2019-12-05 LAB — BASIC METABOLIC PANEL
Anion gap: 10 (ref 5–15)
BUN: 11 mg/dL (ref 8–23)
CO2: 24 mmol/L (ref 22–32)
Calcium: 8.7 mg/dL — ABNORMAL LOW (ref 8.9–10.3)
Chloride: 108 mmol/L (ref 98–111)
Creatinine, Ser: 0.72 mg/dL (ref 0.61–1.24)
GFR calc Af Amer: >60 mL/min (ref >60)
GFR calc non Af Amer: >60 mL/min (ref >60)
Glucose, Bld: 122 mg/dL — ABNORMAL HIGH (ref 70–99)
Potassium: 3.4 mmol/L — ABNORMAL LOW (ref 3.5–5.1)
Sodium: 142 mmol/L (ref 135–145)

## 2019-12-05 LAB — CBC WITH DIFFERENTIAL/PLATELET
Abs Immature Granulocytes: 0.02 10*3/uL (ref 0.00–0.07)
Basophils Absolute: 0 10*3/uL (ref 0.0–0.1)
Basophils Relative: 0 %
Eosinophils Absolute: 0.2 10*3/uL (ref 0.0–0.5)
Eosinophils Relative: 2 %
HCT: 38.5 % — ABNORMAL LOW (ref 39.0–52.0)
Hemoglobin: 12.7 g/dL — ABNORMAL LOW (ref 13.0–17.0)
Immature Granulocytes: 0 %
Lymphocytes Relative: 58 %
Lymphs Abs: 5.7 10*3/uL — ABNORMAL HIGH (ref 0.7–4.0)
MCH: 30.7 pg (ref 26.0–34.0)
MCHC: 33 g/dL (ref 30.0–36.0)
MCV: 93 fL (ref 80.0–100.0)
Monocytes Absolute: 0.4 10*3/uL (ref 0.1–1.0)
Monocytes Relative: 4 %
Neutro Abs: 3.6 10*3/uL (ref 1.7–7.7)
Neutrophils Relative %: 36 %
Platelets: 232 10*3/uL (ref 150–400)
RBC: 4.14 MIL/uL — ABNORMAL LOW (ref 4.22–5.81)
RDW: 13.7 % (ref 11.5–15.5)
WBC: 9.9 10*3/uL (ref 4.0–10.5)
nRBC: 0 % (ref 0.0–0.2)

## 2019-12-05 LAB — GLUCOSE, CAPILLARY
Glucose-Capillary: 132 mg/dL — ABNORMAL HIGH (ref 70–99)
Glucose-Capillary: 157 mg/dL — ABNORMAL HIGH (ref 70–99)
Glucose-Capillary: 223 mg/dL — ABNORMAL HIGH (ref 70–99)
Glucose-Capillary: 231 mg/dL — ABNORMAL HIGH (ref 70–99)
Glucose-Capillary: 99 mg/dL (ref 70–99)

## 2019-12-05 MED ORDER — POTASSIUM CHLORIDE 10 MEQ/100ML IV SOLN
10.0000 meq | INTRAVENOUS | Status: AC
  Administered 2019-12-05 (×3): 10 meq via INTRAVENOUS
  Filled 2019-12-05: qty 100

## 2019-12-05 NOTE — Evaluation (Signed)
Clinical/Bedside Swallow Evaluation Patient Details  Name: Hayden [name] MRN: [id number] Date of Birth: 09/30/1940  Today's Date: 12/05/2019 Time: SLP Start Time (ACUTE ONLY): 7322 SLP Stop Time (ACUTE ONLY): 1100 SLP Time Calculation (min) (ACUTE ONLY): 7 min  Past Medical History:  Past Medical History:  Diagnosis Date  . Candidiasis   . Depression   . Diabetes mellitus   . Enlarged prostate   . Fibromyalgia   . GERD (gastroesophageal reflux disease)    reflux intermittent  . Hyperlipidemia   . 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 Leukemia ([place]) chronic lymphocytic   2008 diagnosed-monitored Dr    Past Surgical History:  Past Surgical History:  Procedure Laterality Date  . ADENOIDECTOMY    . CARPAL TUNNEL RELEASE     rt hand  . CATARACT EXTRACTION, BILATERAL    . CERVICAL FUSION  1998  . CYSTOSCOPY  07/26/2012   Procedure: CYSTOSCOPY;  Surgeon: Ailene Rud, MD;  Location: [facility];  Service: Urology;  Laterality: N/A;  DIAGNOSTIC CYSTO PROSTATE ULTRASOUND      . FEMUR IM NAIL Right 02/10/2019   Procedure: INTRAMEDULLARY (IM) NAIL FEMORAL;  Surgeon: Hiram Gash, MD;  Location: [facility];  Service: Orthopedics;  Laterality: Right;  . PROSTATE BIOPSY  07/26/2012   Procedure: PROSTATE BIOPSY;  Surgeon: Ailene Rud, MD;  Location: [facility];  Service: Urology;  Laterality: N/A;  . retinal micro aneurysms    . tonsil    . VASECTOMY    . VITRECTOMY     HPI:  Hayden [name] is a 79 y.o. male hx of DM, GERD, Covid,  fibromyalgia, dementia, depression, HL, here presenting with AMS from [place]. CT Generalized cerebral atrophy, no acute intracranial abnormality. CXR Patchy right basilar consolidation consistent with pneumonia. BSE 05/2019 recommending nectar liquids and Dys 2 (rec d/c to SNF on nectar and upgrade as able).    Assessment / Plan / Recommendation Clinical  Impression  Pt has a history of dysphagia based on clinical observation from admission 05/2019 and placed on nectar thick liquids and recommended he discharge on nectar. He consumed multiple trials thin via straw (approx 2 oz) without cough, throat clear or wet vocal quality. His mastication and transit was efficient however recommend Dys 3 (chopped meats) while in hospital, thin liquids, pills whole in puree. Pt has pneumonia and history of dysphagia last admission therefore will continue to treat and make appropriate recommendations including instrumental assessment if silent aspiration is suspected.       SLP Visit Diagnosis: Dysphagia, unspecified (R13.10)    Aspiration Risk  Mild aspiration risk    Diet Recommendation Dysphagia 3 (Mech soft);Thin liquid   Liquid Administration via: Cup;Straw Medication Administration: Whole meds with puree Supervision: Patient able to self feed Compensations: Minimize environmental distractions;Slow rate;Small sips/bites Postural Changes: Seated upright at 90 degrees;Remain upright for at least 30 minutes after po intake    Other  Recommendations Oral Care Recommendations: Oral care BID   Follow up Recommendations None      Frequency and Duration min 1 x/week  2 weeks       Prognosis Prognosis for Safe Diet Advancement: (fair-good) Barriers to Reach Goals: Cognitive deficits      Swallow Study   General Date of Onset: 12/03/19 HPI: Hayden [name] is a 79 y.o. male hx of DM, GERD, Covid,  fibromyalgia, dementia, depression, HL, here presenting with AMS from  [place]. CT Generalized cerebral atrophy, no acute intracranial abnormality. CXR Patchy right basilar consolidation consistent with pneumonia. BSE 05/2019 recommending nectar liquids and Dys 2 (rec d/c to SNF on nectar and upgrade as able).  Type of Study: Bedside Swallow Evaluation Previous Swallow Assessment: see HPI Diet Prior to this Study: NPO Temperature Spikes Noted:  No Respiratory Status: Room air History of Recent Intubation: No Behavior/Cognition: Alert;Cooperative;Pleasant mood;Requires cueing Oral Cavity Assessment: Within Functional Limits Oral Care Completed by SLP: No Oral Cavity - Dentition: Other (Comment)(has majority; missing posterior) Vision: Functional for self-feeding Self-Feeding Abilities: Able to feed self;Needs set up Patient Positioning: Upright in chair Baseline Vocal Quality: Normal Volitional Cough: Strong Volitional Swallow: Able to elicit    Oral/Motor/Sensory Function Overall Oral Motor/Sensory Function: Within functional limits   Ice Chips Ice chips: Not tested   Thin Liquid Thin Liquid: Within functional limits Presentation: Cup;Straw    Nectar Thick Nectar Thick Liquid: Not tested   Honey Thick Honey Thick Liquid: Not tested   Puree Puree: Within functional limits   Solid     Solid: Within functional limits      Houston Siren 12/05/2019,11:35 AM   Orbie Pyo Colvin Caroli.Ed Risk analyst [phone] Office [phone]

## 2019-12-05 NOTE — TOC Progression Note (Addendum)
Transition of Care [facility]) - Progression Note    Patient Details  Name: Hayden [name] MRN: [id number] Date of Birth: 05/23/1941  Transition of Care [facility]) CM/SW [place], RN Phone Number: [phone]  12/05/2019, 10:57 AM  Clinical Narrative:    CM attempted to call Rolanda Jay to verify that patient is able to return when medically cleared for discharge. No answer, left message to call CM back. Will continue to follow.   12/05/19 @ 1430 MD calle to inform CM that patient will ready for d/c on 12/06/19. CM attempted to call Jimmie Molly at [facility] to make her aware of the plan. No answer, message left for [facility] to call CM back. Will await return call.   12/06/19 @ 1500 FL2 and discharge summary faxed to [facility] [phone]) . CM called to follow up to make sure patient is able to return. Spoke with Joellen Jersey who is the med tech. Katie put CM on hold then forwarded call to voicemail that is full , unable to leave message. Will attempt to call back.  12/06/19 @1530  spoke with Jenny Reichmann who has reviewed d/c summary and agrees that patient can come back to facility. Bedside nurse has been updated. Transport set up via [employer].   Please call report to [phone].         Expected Discharge Plan and Services                                                 Social Determinants of Health (SDOH) Interventions    Readmission Risk Interventions No flowsheet data found.

## 2019-12-05 NOTE — Progress Notes (Signed)
PROGRESS NOTE    Hayden [name]  AGT:[id number] DOB: 1940-11-10 DOA: 12/03/2019 PCP: Patient, No Pcp Per   Brief Narrative: Patient is 79 year old male with history of CLL, hyperlipidemia, dementia, diabetes mellitus who presented with altered mental status from [place] facility.  He was seen earlier in the emergency department due to lower extremity edema, given Lasix and discharged back to facility but he became altered.  On presentation he was hemodynamically stable.  He was found to have rigidity on the upper extremities.  Head CT was negative for any acute intracranial abnormalities.  Work-up revealed right lower lobe pneumonia.  Admitted for the management of right lower lobe pneumonia.  Assessment & Plan:   Principal Problem:   Right lower lobe pneumonia Active Problems:   CLL (chronic lymphocytic leukemia) (HCC)   Type 2 diabetes mellitus without complication, with long-term current use of insulin (HCC)   Altered mental status   Dementia without behavioral disturbance (HCC)   Right lower lobe pneumonia: Chest x-ray showed right lower lobe infiltrate, most likely community-acquired pneumonia.  Aspiration pneumonia is a possibility. Speech therapy evaluation done and recommended dysphagia 3 diet.  Covid test 19 PCR negative.  No evidence of sepsis.  Procalcitonin negative.  Started on azithromycin and  ceftriaxone.  Follow-up cultures.  Continue gentle IV fluids.  Currently afebrile and hemodynamically stable.  Dementia/altered mental status: Presented with encephalopathy.  Patient has dementia and is confused at baseline.  Patient was found to be more confused than baseline.  But currently he is at his baseline.  Confusion could be secondary to acute metabolic encephalopathy from pneumonia.  He was found to have rigidity of upper extremities suspicion for parkinsonism.  He needs to follow-up with neurology as an outpatient. He is on Aricept, Haldol, Resperdal, Zoloft,  trazodone at his baseline.  History of PE: Patient was diagnosed with Covid and PE in November.  On Xarelto  CLL: He has mild leukocytosis.  Currently in remission.  Was following with oncology in the past.  Diabetes type 2: On Levemir, sliding scale insulin.  Hypertension: On amlodipine.  Hyperlipidemia: On Mevacor           DVT prophylaxis:Xarelto Code Status: DNR Family Communication: None present at the bedside Status is: Inpatient  Remains inpatient appropriate because:IV treatments appropriate due to intensity of illness or inability to take PO   Dispo: The patient is from: SNF              Anticipated d/c is to: SNF              Anticipated d/c date is: 1 day              Patient currently is not medically stable to d/c.  Continue IV antibiotics for today, can be changed to oral tomorrow and discharge back to skilled nursing  facility.   Consultants: None  Procedures:None  Antimicrobials:  Anti-infectives (From admission, onward)   Start     Dose/Rate Route Frequency Ordered Stop   12/04/19 0030  cefTRIAXone (ROCEPHIN) 2 g in sodium chloride 0.9 % 100 mL IVPB     2 g 200 mL/hr over 30 Minutes Intravenous Daily at bedtime 12/04/19 0014     12/04/19 0030  azithromycin (ZITHROMAX) 500 mg in sodium chloride 0.9 % 250 mL IVPB     500 mg 250 mL/hr over 60 Minutes Intravenous Daily at bedtime 12/04/19 0014        Subjective: Patient seen and examined at  the bedside this morning.  Hemodynamically stable.  Comfortable.  No rigidity noticed this morning.  Respiratory status stable and is saturating fine on room air.  Patient knows that he is in hospital but not oriented to person or time.  Objective: Vitals:   12/04/19 1144 12/04/19 1200 12/04/19 1635 12/04/19 2100  BP: (!) 115/57  (!) 109/55 132/68  Pulse: 80  77 80  Resp: 17  18 17   Temp: 98.4 F (36.9 C)  98.2 F (36.8 C) 98 F (36.7 C)  TempSrc:   Oral Oral  SpO2: 97%  98% 100%  Weight:  61 kg      Height:  5\' 5"  (1.651 m)      Intake/Output Summary (Last 24 hours) at 12/05/2019 0831 Last data filed at 12/05/2019 0700 Gross per 24 hour  Intake 1570.68 ml  Output 2900 ml  Net -1329.32 ml   Filed Weights   12/04/19 1200  Weight: 61 kg    Examination:  General exam: Appears calm and comfortable ,Not in distress,average built HEENT:PERRL,Oral mucosa moist, Ear/Nose normal on gross exam Respiratory system: Bilateral equal air entry, normal vesicular breath sounds, no wheezes or crackles  Cardiovascular system: S1 & S2 heard, RRR. No JVD, murmurs, rubs, gallops or clicks. No pedal edema. Gastrointestinal system: Abdomen is nondistended, soft and nontender. No organomegaly or masses felt. Normal bowel sounds heard. Central nervous system: Alert and oriented. No focal neurological deficits. Extremities: No edema, no clubbing ,no cyanosis, distal peripheral pulses palpable. Skin: No rashes, lesions or ulcers,no icterus ,no pallor MSK: Normal muscle bulk,tone ,power Psychiatry: Judgement and insight appear normal. Mood & affect appropriate.     Data Reviewed: I have personally reviewed following labs and imaging studies  CBC: Recent Labs  Lab 12/03/19 1806 12/04/19 0015 12/05/19 0216  WBC 11.9* 11.6* 9.9  NEUTROABS 5.3 5.3 3.6  HGB 13.5 13.5 12.7*  HCT 40.3 40.1 38.5*  MCV 93.1 92.0 93.0  PLT 242 234 165   Basic Metabolic Panel: Recent Labs  Lab 12/03/19 1806 12/04/19 0015 12/05/19 0216  NA 140 141 142  K 3.8 3.1* 3.4*  CL 102 103 108  CO2 25 23 24   GLUCOSE 127* 99 122*  BUN 19 15 11   CREATININE 0.71 0.83 0.72  CALCIUM 9.7 9.5 8.7*   GFR: Estimated Creatinine Clearance: 63.5 mL/min (by C-G formula based on SCr of 0.72 mg/dL). Liver Function Tests: Recent Labs  Lab 12/03/19 1806 12/04/19 0015  AST 33 36  ALT 25 25  ALKPHOS 87 87  BILITOT 0.8 1.5*  PROT 6.7 6.1*  ALBUMIN 4.3 4.1   No results for input(s): LIPASE, AMYLASE in the last 168 hours. Recent  Labs  Lab 12/04/19 0015  AMMONIA 31   Coagulation Profile: No results for input(s): INR, PROTIME in the last 168 hours. Cardiac Enzymes: No results for input(s): CKTOTAL, CKMB, CKMBINDEX, TROPONINI in the last 168 hours. BNP (last 3 results) No results for input(s): PROBNP in the last 8760 hours. HbA1C: No results for input(s): HGBA1C in the last 72 hours. CBG: Recent Labs  Lab 12/04/19 0039 12/04/19 1146 12/04/19 1636 12/04/19 1953 12/05/19 0012  GLUCAP 95 96 115* 162* 132*   Lipid Profile: No results for input(s): CHOL, HDL, LDLCALC, TRIG, CHOLHDL, LDLDIRECT in the last 72 hours. Thyroid Function Tests: No results for input(s): TSH, T4TOTAL, FREET4, T3FREE, THYROIDAB in the last 72 hours. Anemia Panel: No results for input(s): VITAMINB12, FOLATE, FERRITIN, TIBC, IRON, RETICCTPCT in the last 72 hours. Sepsis Labs:  Recent Labs  Lab 12/04/19 0030 12/04/19 0509 12/04/19 1902  PROCALCITON  --   --  <0.10  LATICACIDVEN 1.8 1.4  --     Recent Results (from the past 240 hour(s))  Culture, blood (routine x 2)     Status: None (Preliminary result)   Collection Time: 12/04/19 12:30 AM   Specimen: BLOOD  Result Value Ref Range Status   Specimen Description BLOOD LEFT HAND  Final   Special Requests   Final    BOTTLES DRAWN AEROBIC AND ANAEROBIC Blood Culture results may not be optimal due to an excessive volume of blood received in culture bottles   Culture   Final    NO GROWTH 1 DAY Performed at [facility] [address] [place], [place] [address]    Report Status PENDING  Incomplete  Culture, blood (routine x 2)     Status: None (Preliminary result)   Collection Time: 12/04/19 12:30 AM   Specimen: BLOOD  Result Value Ref Range Status   Specimen Description BLOOD RIGHT HAND  Final   Special Requests   Final    BOTTLES DRAWN AEROBIC AND ANAEROBIC Blood Culture adequate volume   Culture   Final    NO GROWTH 1 DAY Performed at [facility], [place]  [address], [place], [place] [address]    Report Status PENDING  Incomplete  SARS Coronavirus 2 by RT PCR (hospital order, performed in [facility] hospital lab) Nasopharyngeal Nasopharyngeal Swab     Status: None   Collection Time: 12/04/19  1:39 AM   Specimen: Nasopharyngeal Swab  Result Value Ref Range Status   SARS Coronavirus 2 NEGATIVE NEGATIVE Final    Comment: (NOTE) SARS-CoV-2 target nucleic acids are NOT DETECTED. The SARS-CoV-2 RNA is generally detectable in upper and lower respiratory specimens during the acute phase of infection. The lowest concentration of SARS-CoV-2 viral copies this assay can detect is 250 copies / mL. A negative result does not preclude SARS-CoV-2 infection and should not be used as the sole basis for treatment or other patient management decisions.  A negative result may occur with improper specimen collection / handling, submission of specimen other than nasopharyngeal swab, presence of viral mutation(s) within the areas targeted by this assay, and inadequate number of viral copies (<250 copies / mL). A negative result must be combined with clinical observations, patient history, and epidemiological information. Fact Sheet for Patients:   StrictlyIdeas.no Fact Sheet for Healthcare Providers: BankingDealers.co.za This test is not yet approved or cleared  by the [place] FDA and has been authorized for detection and/or diagnosis of SARS-CoV-2 by FDA under an Emergency Use Authorization (EUA).  This EUA will remain in effect (meaning this test can be used) for the duration of the COVID-19 declaration under Section 564(b)(1) of the Act, 21 U.S.C. section 360bbb-3(b)(1), unless the authorization is terminated or revoked sooner. Performed at [facility], [place] [address] [place], [place] [address]          Radiology Studies: DG Chest 1 View  Result Date: 12/03/2019 CLINICAL DATA:  Altered level of  consciousness EXAM: CHEST  1 VIEW COMPARISON:  05/14/2019 FINDINGS: Single frontal view of the chest demonstrates an unremarkable cardiac silhouette. There is patchy consolidation at the lateral right lung base, with small right pleural effusion. No pneumothorax. Left chest is clear. No acute bony abnormalities. IMPRESSION: 1. Patchy right basilar consolidation consistent with pneumonia. 2. Trace right pleural effusion. Electronically Signed   By: Legrand Como  Owens Shark M.D.   On: 12/03/2019 23:34   CT Head Wo Contrast  Result Date: 12/03/2019 CLINICAL DATA:  Altered mental status. EXAM: CT HEAD WITHOUT CONTRAST TECHNIQUE: Contiguous axial images were obtained from the base of the skull through the vertex without intravenous contrast. COMPARISON:  May 11, 2019 FINDINGS: Brain: There is mild cerebral atrophy with widening of the extra-axial spaces and ventricular dilatation. There are areas of decreased attenuation within the white matter tracts of the supratentorial brain, consistent with microvascular disease changes. Vascular: No hyperdense vessel or unexpected calcification. Skull: Normal. Negative for fracture or focal lesion. Sinuses/Orbits: No acute finding. Other: None. IMPRESSION: 1. Generalized cerebral atrophy. 2. No acute intracranial abnormality. Electronically Signed   By: Virgina Norfolk M.D.   On: 12/03/2019 23:48        Scheduled Meds: . amLODipine  10 mg Oral Daily  . donepezil  10 mg Oral QHS  . insulin aspart  0-15 Units Subcutaneous TID WC  . insulin aspart  0-5 Units Subcutaneous QHS  . insulin detemir  20 Units Subcutaneous QHS  . pantoprazole  40 mg Oral Daily  . pravastatin  20 mg Oral q1800  . risperiDONE  2 mg Oral BID  . rivaroxaban  20 mg Oral Q supper  . sertraline  25 mg Oral Daily  . tamsulosin  0.4 mg Oral Daily   Continuous Infusions: . sodium chloride 75 mL/hr at 12/04/19 1012  . azithromycin (ZITHROMAX) 500 MG IVPB (Vial-Mate [employer]) 500 mg (12/04/19 2210)    . cefTRIAXone (ROCEPHIN)  IV 2 g (12/04/19 2105)     LOS: 1 day    Time spent: 25 mins.More than 50% of that time was spent in counseling and/or coordination of care.      Shelly Coss, MD [facility] P6/12/2019, 8:31 AM

## 2019-12-05 NOTE — Evaluation (Signed)
Physical Therapy Evaluation Patient Details Name: Hayden [name] MRN: [id number] DOB: [birth year] Today's Date: 12/05/2019   History of Present Illness  79 y.o. male with medical history significant of CLL; HLD; dementia; R femur fx (August 2020), h/o COVID (Nov 2020) and DM presenting with AMS.    Clinical Impression  Pt admitted with above diagnosis. PTA pt resided in memory care unit at [facility], wheelchair dependent. Pt reports he was able to self propel using BUE/LE. Pt is a poor historian so unsure of that information. On eval, he required max assist bed mobility, +2 max assist sit to stand with RW, and +2 max assist SPT.  Pt currently with functional limitations due to the deficits listed below (see PT Problem List). Pt will benefit from skilled PT to increase their independence and safety with mobility to allow discharge to the venue listed below.       Follow Up Recommendations SNF    Equipment Recommendations  None recommended by PT    Recommendations for Other Services       Precautions / Restrictions Precautions Precautions: Fall Restrictions Weight Bearing Restrictions: No      Mobility  Bed Mobility Overal bed mobility: Needs Assistance Bed Mobility: Supine to Sit     Supine to sit: HOB elevated;Max assist;+2 for safety/equipment     General bed mobility comments: increased time, cues for sequencing  Transfers Overall transfer level: Needs assistance Equipment used: 2 person hand held assist;Rolling walker (2 wheeled) Transfers: Sit to/from [facility] Sit to Stand: +2 physical assistance;Max assist;+2 safety/equipment Stand pivot transfers: Max assist;+2 physical assistance       General transfer comment: Sit to stand with RW x 2 trials from EOB. Assist to power up and stabilize RW. Pt with posterior lean. RW removed to perform SPT +2 max assist bed to recliner.  Ambulation/Gait             General Gait Details: nonambulatory  at baseline  Stairs            Wheelchair Mobility    Modified Rankin (Stroke Patients Only)       Balance Overall balance assessment: Needs assistance Sitting-balance support: No upper extremity supported;Feet supported Sitting balance-Leahy Scale: Fair Sitting balance - Comments: minguard for sitting balance Postural control: Posterior lean Standing balance support: Bilateral upper extremity supported Standing balance-Leahy Scale: Poor Standing balance comment: heavy reliance on BUE support on RW and from therapists                             Pertinent Vitals/Pain Pain Assessment: No/denies pain Faces Pain Scale: No hurt    Home Living Family/patient expects to be discharged to:: Skilled nursing facility                 Additional Comments: memory care unit    Prior Function Level of Independence: Needs assistance   Gait / Transfers Assistance Needed: w/c dependent. Pt reports able to self-propel using BUE/LE (unsure of validity as pt is poor historian).  ADL's / Homemaking Assistance Needed: able to feed himself  Comments: Minimal verbalizations. Slow to respond.     Hand Dominance   Dominant Hand: Right    Extremity/Trunk Assessment   Upper Extremity Assessment Upper Extremity Assessment: Generalized weakness    Lower Extremity Assessment Lower Extremity Assessment: Generalized weakness    Cervical / Trunk Assessment Cervical / Trunk Assessment: Kyphotic  Communication   Communication: Other (  comment)(frequently no verbal response; stated name, yes, no)  Cognition Arousal/Alertness: Awake/alert Behavior During Therapy: Flat affect Overall Cognitive Status: No family/caregiver present to determine baseline cognitive functioning Area of Impairment: Orientation;Attention;Memory;Following commands;Safety/judgement;Problem solving;Awareness                 Orientation Level: Situation;Disoriented to;Place Current Attention  Level: Sustained Memory: Decreased short-term memory Following Commands: Follows one step commands with increased time Safety/Judgement: Decreased awareness of safety Awareness: Intellectual Problem Solving: Slow processing;Difficulty sequencing;Requires verbal cues;Requires tactile cues General Comments: Easily falls asleep without cueing/interaction.oriented to month and year, required cues for task initiation      General Comments General comments (skin integrity, edema, etc.): SpO2 95% on RA    Exercises     Assessment/Plan    PT Assessment Patient needs continued PT services  PT Problem List Decreased strength;Decreased mobility;Decreased activity tolerance;Decreased balance;Decreased knowledge of use of DME       PT Treatment Interventions Therapeutic activities;Therapeutic exercise;Patient/family education;Balance training;Wheelchair mobility training;Functional mobility training    PT Goals (Current goals can be found in the Care Plan section)  Acute Rehab PT Goals Patient Stated Goal: not stated PT Goal Formulation: With patient Time For Goal Achievement: 12/19/19 Potential to Achieve Goals: Good    Frequency Min 2X/week   Barriers to discharge        Co-evaluation PT/OT/SLP Co-Evaluation/Treatment: Yes Reason for Co-Treatment: For patient/therapist safety;To address functional/ADL transfers PT goals addressed during session: Mobility/safety with mobility;Balance;Proper use of DME OT goals addressed during session: ADL's and self-care       AM-PAC PT "6 Clicks" Mobility  Outcome Measure Help needed turning from your back to your side while in a flat bed without using bedrails?: A Lot Help needed moving from lying on your back to sitting on the side of a flat bed without using bedrails?: A Lot Help needed moving to and from a bed to a chair (including a wheelchair)?: A Lot Help needed standing up from a chair using your arms (e.g., wheelchair or bedside  chair)?: A Lot Help needed to walk in hospital room?: Total Help needed climbing 3-5 steps with a railing? : Total 6 Click Score: 10    End of Session Equipment Utilized During Treatment: Gait belt Activity Tolerance: Patient tolerated treatment well Patient left: in chair;with chair alarm set;with call bell/phone within reach Nurse Communication: Mobility status PT Visit Diagnosis: Other abnormalities of gait and mobility (R26.89);Muscle weakness (generalized) (M62.81)    Time: 6979-4801 PT Time Calculation (min) (ACUTE ONLY): 22 min   Charges:   PT Evaluation $PT Eval Moderate Complexity: 1 Mod          Lorrin Goodell, PT  Office # [phone] Pager [phone]   [name] 12/05/2019, 11:37 AM

## 2019-12-05 NOTE — Evaluation (Signed)
Occupational Therapy Evaluation Patient Details Name: Hayden [name] MRN: [id number] DOB: 02-15-1941 Today's Date: 12/05/2019    History of Present Illness 79 y.o. male with medical history significant of CLL; HLD; dementia; R femur fx (August 2020), h/o COVID (Nov 2020) and DM presenting with AMS.   Clinical Impression   PTA, pt was living at memory care unit, per chart, pt was able to feed himself. Pt reports he was using a w/c and was able to self-propel, he would do a stand-pivot transfer to w/c. Pt with hx of cognitive limitations at baseline, pt oriented to year and self, stated month was May. Pt currently requires maxA+2 for sit<>stand and stand-pivot transfer. He was incontinent of bowels upon standing and required totalA for posterior care. Pt required verbal cues to prompt task initiation. Due to decline in current level of function, pt would benefit from acute OT to address established goals to facilitate safe D/C to venue listed below. At this time, recommend SNF follow-up. Will continue to follow acutely.     Follow Up Recommendations  SNF;Supervision/Assistance - 24 hour    Equipment Recommendations  3 in 1 bedside commode    Recommendations for Other Services       Precautions / Restrictions Precautions Precautions: Fall Restrictions Weight Bearing Restrictions: No      Mobility Bed Mobility Overal bed mobility: Needs Assistance Bed Mobility: Supine to Sit     Supine to sit: Max assist;+2 for physical assistance;+2 for safety/equipment;HOB elevated     General bed mobility comments: maxA+2 for all aspects  Transfers Overall transfer level: Needs assistance Equipment used: 2 person hand held assist;Rolling walker (2 wheeled) Transfers: Sit to/from [facility] Sit to Stand: Max assist;+2 physical assistance;+2 safety/equipment Stand pivot transfers: Max assist;+2 physical assistance       General transfer comment: maxA+2 for sit<>stand from  EOB use of RW to stabilize;maxA+2 for stand pivot with support from therapists    Balance Overall balance assessment: Needs assistance Sitting-balance support: No upper extremity supported;Feet supported Sitting balance-Leahy Scale: Fair Sitting balance - Comments: minguard for sitting balance Postural control: Posterior lean Standing balance support: Bilateral upper extremity supported Standing balance-Leahy Scale: Poor Standing balance comment: heavy reliance on BUE support on RW and from therapists                           ADL either performed or assessed with clinical judgement   ADL Overall ADL's : Needs assistance/impaired Eating/Feeding: NPO   Grooming: Min guard;Sitting Grooming Details (indicate cue type and reason): vc for task initiation Upper Body Bathing: Minimal assistance;Sitting   Lower Body Bathing: Maximal assistance;Sit to/from stand   Upper Body Dressing : Minimal assistance;Sitting   Lower Body Dressing: Maximal assistance;+2 for physical assistance;+2 for safety/equipment;Sit to/from stand   Toilet Transfer: Maximal assistance;+2 for physical assistance;+2 for safety/equipment;Stand-pivot Toilet Transfer Details (indicate cue type and reason): to recliner Toileting- Clothing Manipulation and Hygiene: Maximal assistance;+2 for physical assistance;+2 for safety/equipment;Sit to/from stand       Functional mobility during ADLs: Maximal assistance;+2 for physical assistance;+2 for safety/equipment General ADL Comments: maxA+2 for sit<>stand and stand pivot;pt limited by weakness, cognition, and lethargy     Vision         Perception     Praxis      Pertinent Vitals/Pain Pain Assessment: No/denies pain     Hand Dominance Right   Extremity/Trunk Assessment Upper Extremity Assessment Upper Extremity Assessment: Generalized weakness  Lower Extremity Assessment Lower Extremity Assessment: Generalized weakness   Cervical / Trunk  Assessment Cervical / Trunk Assessment: Kyphotic   Communication Communication Communication: Other (comment)(frequently no verbal response; stated name, yes, no)   Cognition Arousal/Alertness: Awake/alert Behavior During Therapy: Flat affect Overall Cognitive Status: No family/caregiver present to determine baseline cognitive functioning Area of Impairment: Orientation;Attention;Memory;Following commands;Safety/judgement;Problem solving;Awareness                 Orientation Level: Situation;Disoriented to;Place Current Attention Level: Sustained Memory: Decreased short-term memory Following Commands: Follows one step commands with increased time Safety/Judgement: Decreased awareness of safety Awareness: Intellectual Problem Solving: Slow processing;Difficulty sequencing;Requires verbal cues;Requires tactile cues General Comments: Easily falls asleep without cueing/interaction.oriented to month and year, required cues for task initiation   General Comments  SpO2 95% RA    Exercises     Shoulder Instructions      Home Living Family/patient expects to be discharged to:: Skilled nursing facility                                 Additional Comments: memory care unit      Prior Functioning/Environment Level of Independence: Needs assistance  Gait / Transfers Assistance Needed: w/c dependent. Pt reports able to self-propel using BUE/LE (unsure of validity as pt is poor historian). ADL's / Homemaking Assistance Needed: able to feed himself   Comments: Minimal verbalizations. Slow to respond.        OT Problem List: Decreased activity tolerance;Impaired balance (sitting and/or standing);Decreased safety awareness;Decreased cognition;Decreased knowledge of use of DME or AE      OT Treatment/Interventions: Self-care/ADL training;Therapeutic exercise;Energy conservation;DME and/or AE instruction;Therapeutic activities;Cognitive  remediation/compensation;Patient/family education;Balance training    OT Goals(Current goals can be found in the care plan section) Acute Rehab OT Goals Patient Stated Goal: pt did not state OT Goal Formulation: With patient Time For Goal Achievement: 12/19/19 Potential to Achieve Goals: Fair ADL Goals Pt Will Perform Eating: (P) with supervision;sitting Pt Will Perform Grooming: (P) with supervision;sitting Pt Will Perform Lower Body Dressing: (P) with min assist;sit to/from stand Pt Will Transfer to Toilet: (P) with min assist;stand pivot transfer  OT Frequency: Min 2X/week   Barriers to D/C:            Co-evaluation PT/OT/SLP Co-Evaluation/Treatment: Yes Reason for Co-Treatment: For patient/therapist safety;To address functional/ADL transfers   OT goals addressed during session: ADL's and self-care      AM-PAC OT "6 Clicks" Daily Activity     Outcome Measure Help from another person eating meals?: Total(NPO) Help from another person taking care of personal grooming?: A Little Help from another person toileting, which includes using toliet, bedpan, or urinal?: A Lot Help from another person bathing (including washing, rinsing, drying)?: A Little Help from another person to put on and taking off regular upper body clothing?: A Little Help from another person to put on and taking off regular lower body clothing?: A Lot 6 Click Score: 14   End of Session Equipment Utilized During Treatment: Gait belt;Rolling walker Nurse Communication: Mobility status  Activity Tolerance: Patient tolerated treatment well Patient left: in chair;with call bell/phone within reach;with chair alarm set  OT Visit Diagnosis: Other abnormalities of gait and mobility (R26.89);Muscle weakness (generalized) (M62.81);Other symptoms and signs involving cognitive function                Time: 0175-1025 OT Time Calculation (min): 20 min Charges:  OT General Charges $  OT Visit: 1 Visit OT  Evaluation $OT Eval Moderate Complexity: 1 Mod  Jaison Petraglia OTR/L Acute Rehabilitation Services Office: [phone]   Wyn Forster 12/05/2019, 11:05 AM

## 2019-12-06 ENCOUNTER — Inpatient Hospital Stay (HOSPITAL_COMMUNITY): Payer: Medicare Other

## 2019-12-06 LAB — GLUCOSE, CAPILLARY
Glucose-Capillary: 108 mg/dL — ABNORMAL HIGH (ref 70–99)
Glucose-Capillary: 145 mg/dL — ABNORMAL HIGH (ref 70–99)
Glucose-Capillary: 192 mg/dL — ABNORMAL HIGH (ref 70–99)
Glucose-Capillary: 206 mg/dL — ABNORMAL HIGH (ref 70–99)
Glucose-Capillary: 85 mg/dL (ref 70–99)

## 2019-12-06 LAB — BASIC METABOLIC PANEL
Anion gap: 9 (ref 5–15)
BUN: 10 mg/dL (ref 8–23)
CO2: 23 mmol/L (ref 22–32)
Calcium: 8.8 mg/dL — ABNORMAL LOW (ref 8.9–10.3)
Chloride: 109 mmol/L (ref 98–111)
Creatinine, Ser: 0.67 mg/dL (ref 0.61–1.24)
GFR calc Af Amer: >60 mL/min (ref >60)
GFR calc non Af Amer: >60 mL/min (ref >60)
Glucose, Bld: 107 mg/dL — ABNORMAL HIGH (ref 70–99)
Potassium: 3.6 mmol/L (ref 3.5–5.1)
Sodium: 141 mmol/L (ref 135–145)

## 2019-12-06 MED ORDER — LEVOFLOXACIN 750 MG PO TABS
750.0000 mg | ORAL_TABLET | Freq: Every day | ORAL | 0 refills | Status: AC
Start: 2019-12-07 — End: 2019-12-12

## 2019-12-06 NOTE — Discharge Summary (Signed)
Physician Discharge Summary  Hayden [name] HWE:[id number] DOB: January 10, 1941 DOA: 12/03/2019  PCP: Patient, No Pcp Per  Admit date: 12/03/2019 Discharge date: 12/06/2019  Admitted From: SNF Disposition:  SNF  Discharge Condition:Stable CODE STATUS: DNR Diet recommendation:  Dysphagia 3 diet  Brief/Interim Summary:  Patient is 79 year old male with history of CLL, hyperlipidemia, dementia, diabetes mellitus who presented with altered mental status from [place] facility.  He was seen earlier in the emergency department due to lower extremity edema, given Lasix and discharged back to facility but he became altered.  On presentation he was hemodynamically stable.  He was found to have rigidity on the upper extremities.  Head CT was negative for any acute intracranial abnormalities. Work-up revealed right lower lobe pneumonia.  Admitted for the management of right lower lobe pneumonia. His hospital course remained stable.  He was seen by speech for suspicion of aspiration pneumonia.  Recommended to start on dysphagia 3 diet.  He is hemodynamically stable for discharge back to facility today with oral antibiotic.  He should follow-up with neurology as an outpatient for evaluation of his dementia, rigidity of his upper extremities.  Following problems were addressed during his hospitalization:  Right lower lobe pneumonia: Chest x-ray showed right lower lobe infiltrate, most likely community-acquired pneumonia.  Aspiration pneumonia is a possibility. Speech therapy evaluation done and recommended dysphagia 3 diet.  Covid test 19 PCR negative. No evidence of sepsis.  Procalcitonin negative.  Started on azithromycin and  ceftriaxone. Currently afebrile and hemodynamically stable.  Antibiotics changed to oral on discharge.  Dementia/altered mental status: Presented with encephalopathy.  Patient has dementia and is confused at baseline.  Patient was found to be more confused than baseline.  But  currently he is at his baseline.   He was found to have rigidity of upper extremities suspicion for parkinsonism.  He needs to follow-up with neurology as an outpatient. He is on Aricept, Haldol, Resperdal, Zoloft, trazodone at his baseline.  History of PE: Patient was diagnosed with Covid and PE in November.  On Xarelto  CLL: He has mild leukocytosis.  Currently in remission.  Was following with oncology in the past.  Diabetes type 2: Continue previous regimen  Hypertension: On amlodipine.  Hyperlipidemia: On Mevacor    Discharge Diagnoses:  Principal Problem:   Right lower lobe pneumonia Active Problems:   CLL (chronic lymphocytic leukemia) (HCC)   Type 2 diabetes mellitus without complication, with long-term current use of insulin (HCC)   Altered mental status   Dementia without behavioral disturbance [facility])    Discharge Instructions  Discharge Instructions    Ambulatory referral to Neurology   Complete by: As directed    An appointment is requested in approximately: 4 weeks   Diet - low sodium heart healthy   Complete by: As directed    Dysphagia 3 diet   Discharge instructions   Complete by: As directed    1)Please follow up with your PCP in a week. 2)Follow up with [facility] neurology as an outpatient.  Name and number of the provider group has been attached.   Increase activity slowly   Complete by: As directed      Allergies as of 12/06/2019      Reactions   Bee Venom Other (See Comments)   Bump where stung and took days to go away   Glucophage [metformin] Diarrhea   "I will never take it again - stool incontinent"   Shellfish Allergy Swelling   Grass Extracts Roosvelt Maser  Pollens] Other (See Comments)   Unknown rxn   Tobacco [tobacco] Other (See Comments)   Unknown rxn      Medication List    TAKE these medications   acetaminophen 500 MG tablet Commonly known as: TYLENOL Take 500 mg by mouth every 4 (four) hours as needed for mild pain, fever or  headache.   acetaminophen 325 MG tablet Commonly known as: TYLENOL Take 2 tablets (650 mg total) by mouth every 6 (six) hours as needed for mild pain (or Fever >/= 101).   amLODipine 10 MG tablet Commonly known as: NORVASC Take 1 tablet (10 mg total) by mouth daily.   celecoxib 200 MG capsule Commonly known as: CELEBREX Take 200 mg by mouth 2 (two) times daily.   Cholecalciferol 50 MCG (2000 UT) Caps Take 1 capsule by mouth daily.   Docusate Sodium 100 MG capsule Take 100 mg by mouth daily.   donepezil 10 MG tablet Commonly known as: ARICEPT Take 1 tablet (10 mg total) by mouth at bedtime.   EPINEPHrine 0.15 MG/0.3ML injection Commonly known as: EPIPEN JR Inject 0.15 mg into the muscle as needed for anaphylaxis.   furosemide 20 MG tablet Commonly known as: LASIX Take 1 tablet (20 mg total) by mouth daily.   guaiFENesin 100 MG/5ML liquid Commonly known as: ROBITUSSIN Take 200 mg by mouth every 6 (six) hours as needed for cough.   haloperidol 5 MG tablet Commonly known as: HALDOL Take 5 mg by mouth every 8 (eight) hours as needed for agitation.   insulin lispro 100 UNIT/ML KwikPen Commonly known as: HUMALOG Inject 6 Units into the skin 3 (three) times daily with meals.   Levemir FlexTouch 100 UNIT/ML FlexPen Generic drug: insulin detemir Inject 24 Units into the skin at bedtime. What changed: how much to take   levofloxacin 750 MG tablet Commonly known as: Levaquin Take 1 tablet (750 mg total) by mouth daily for 5 days. Start taking on: December 07, 2019   loperamide 2 MG capsule Commonly known as: IMODIUM Take 2 mg by mouth See admin instructions. Take 2 mg by mouth with each loose stool as needed for diarrhea (do not exceed 8 doses in 24 hours)   lovastatin 20 MG tablet Commonly known as: MEVACOR Take 20 mg by mouth daily.   magnesium hydroxide 400 MG/5ML suspension Commonly known as: MILK OF MAGNESIA Take 30 mLs by mouth at bedtime as needed for mild  constipation.   neomycin-bacitracin-polymyxin [phone] ointment Apply 1 application topically See admin instructions. *Standing order for skin tear, abrasions* Clean area with normal saline. Apply tao, cover with bandaid or gauze and tape. Change as needed until healed.   omeprazole 20 MG capsule Commonly known as: PRILOSEC Take 1 capsule (20 mg total) by mouth daily.   risperiDONE 2 MG tablet Commonly known as: RISPERDAL Take 2 mg by mouth 2 (two) times daily.   rivaroxaban 20 MG Tabs tablet Commonly known as: XARELTO Take 1 tablet (20 mg total) by mouth daily with supper. Patient will be on Xarelto loading dose until 06/07/2019, then he will be transitioned to this dose Xarelto 20 mg p.o. once daily on 06/08/2019   sertraline 25 MG tablet Commonly known as: ZOLOFT Take 25 mg by mouth daily.   tamsulosin 0.4 MG Caps capsule Commonly known as: FLOMAX Take 0.4 mg by mouth daily.   traZODone 50 MG tablet Commonly known as: DESYREL Take 50 mg by mouth every 8 (eight) hours as needed (agitation).  Follow-up Information    [facility]. Schedule an appointment as soon as possible for a visit in 4 week(s).   Specialty: Neurology Contact information: [place] [phone]         Allergies  Allergen Reactions  . Bee Venom Other (See Comments)    Bump where stung and took days to go away  . Glucophage [Metformin] Diarrhea    "I will never take it again - stool incontinent"  . Shellfish Allergy Swelling  . Grass Extracts [Gramineae Pollens] Other (See Comments)    Unknown rxn  . Tobacco [Tobacco] Other (See Comments)    Unknown rxn    Consultations:  None   Procedures/Studies: DG Chest 1 View  Result Date: 12/03/2019 CLINICAL DATA:  Altered level of consciousness EXAM: CHEST  1 VIEW COMPARISON:  05/14/2019 FINDINGS: Single frontal view of the chest demonstrates an unremarkable cardiac  silhouette. There is patchy consolidation at the lateral right lung base, with small right pleural effusion. No pneumothorax. Left chest is clear. No acute bony abnormalities. IMPRESSION: 1. Patchy right basilar consolidation consistent with pneumonia. 2. Trace right pleural effusion. Electronically Signed   By: Randa Ngo M.D.   On: 12/03/2019 23:34   CT Head Wo Contrast  Result Date: 12/03/2019 CLINICAL DATA:  Altered mental status. EXAM: CT HEAD WITHOUT CONTRAST TECHNIQUE: Contiguous axial images were obtained from the base of the skull through the vertex without intravenous contrast. COMPARISON:  May 11, 2019 FINDINGS: Brain: There is mild cerebral atrophy with widening of the extra-axial spaces and ventricular dilatation. There are areas of decreased attenuation within the white matter tracts of the supratentorial brain, consistent with microvascular disease changes. Vascular: No hyperdense vessel or unexpected calcification. Skull: Normal. Negative for fracture or focal lesion. Sinuses/Orbits: No acute finding. Other: None. IMPRESSION: 1. Generalized cerebral atrophy. 2. No acute intracranial abnormality. Electronically Signed   By: Virgina Norfolk M.D.   On: 12/03/2019 23:48       Subjective: Patient seen and examined at the bedside this morning.  Hemodynamically stable for discharge back to the facility today.  Discharge Exam: Vitals:   12/05/19 2348 12/06/19 0759  BP: (!) 115/57 133/65  Pulse: 73 67  Resp:  10  Temp: 98.2 F (36.8 C) 97.7 F (36.5 C)  SpO2: 98% 99%   Vitals:   12/05/19 1729 12/05/19 1731 12/05/19 2348 12/06/19 0759  BP: 121/68 135/67 (!) 115/57 133/65  Pulse: 97 94 73 67  Resp: 16 16  10   Temp: 97.7 F (36.5 C) 97.7 F (36.5 C) 98.2 F (36.8 C) 97.7 F (36.5 C)  TempSrc: Oral Oral Oral   SpO2: 100% 99% 98% 99%  Weight:      Height:        General: Pt is confused, not in acute distress Cardiovascular: RRR, S1/S2 +, no rubs, no  gallops Respiratory: CTA bilaterally, no wheezing, no rhonchi Abdominal: Soft, NT, ND, bowel sounds + Extremities: no edema, no cyanosis    The results of significant diagnostics from this hospitalization (including imaging, microbiology, ancillary and laboratory) are listed below for reference.     Microbiology: Recent Results (from the past 240 hour(s))  Culture, blood (routine x 2)     Status: None (Preliminary result)   Collection Time: 12/04/19 12:30 AM   Specimen: BLOOD  Result Value Ref Range Status   Specimen Description BLOOD LEFT HAND  Final   Special Requests   Final    BOTTLES  DRAWN AEROBIC AND ANAEROBIC Blood Culture results may not be optimal due to an excessive volume of blood received in culture bottles   Culture   Final    NO GROWTH 2 DAYS Performed at [facility] [address], [place], [place] [address]    Report Status PENDING  Incomplete  Culture, blood (routine x 2)     Status: None (Preliminary result)   Collection Time: 12/04/19 12:30 AM   Specimen: BLOOD  Result Value Ref Range Status   Specimen Description BLOOD RIGHT HAND  Final   Special Requests   Final    BOTTLES DRAWN AEROBIC AND ANAEROBIC Blood Culture adequate volume   Culture   Final    NO GROWTH 2 DAYS Performed at [facility], [place] [address], [place], [place] [address]    Report Status PENDING  Incomplete  SARS Coronavirus 2 by RT PCR (hospital order, performed in [facility] hospital lab) Nasopharyngeal Nasopharyngeal Swab     Status: None   Collection Time: 12/04/19  1:39 AM   Specimen: Nasopharyngeal Swab  Result Value Ref Range Status   SARS Coronavirus 2 NEGATIVE NEGATIVE Final    Comment: (NOTE) SARS-CoV-2 target nucleic acids are NOT DETECTED. The SARS-CoV-2 RNA is generally detectable in upper and lower respiratory specimens during the acute phase of infection. The lowest concentration of SARS-CoV-2 viral copies this assay can detect is 250 copies / mL. A  negative result does not preclude SARS-CoV-2 infection and should not be used as the sole basis for treatment or other patient management decisions.  A negative result may occur with improper specimen collection / handling, submission of specimen other than nasopharyngeal swab, presence of viral mutation(s) within the areas targeted by this assay, and inadequate number of viral copies (<250 copies / mL). A negative result must be combined with clinical observations, patient history, and epidemiological information. Fact Sheet for Patients:   StrictlyIdeas.no Fact Sheet for Healthcare Providers: BankingDealers.co.za This test is not yet approved or cleared  by the [place] FDA and has been authorized for detection and/or diagnosis of SARS-CoV-2 by FDA under an Emergency Use Authorization (EUA).  This EUA will remain in effect (meaning this test can be used) for the duration of the COVID-19 declaration under Section 564(b)(1) of the Act, 21 U.S.C. section 360bbb-3(b)(1), unless the authorization is terminated or revoked sooner. Performed at [facility], [place] [address], [place], [place] [address]      Labs: BNP (last 3 results) Recent Labs    12/03/19 1807  BNP 30.0   Basic Metabolic Panel: Recent Labs  Lab 12/03/19 1806 12/04/19 0015 12/05/19 0216 12/06/19 0551  NA 140 141 142 141  K 3.8 3.1* 3.4* 3.6  CL 102 103 108 109  CO2 25 23 24 23   GLUCOSE 127* 99 122* 107*  BUN 19 15 11 10   CREATININE 0.71 0.83 0.72 0.67  CALCIUM 9.7 9.5 8.7* 8.8*   Liver Function Tests: Recent Labs  Lab 12/03/19 1806 12/04/19 0015  AST 33 36  ALT 25 25  ALKPHOS 87 87  BILITOT 0.8 1.5*  PROT 6.7 6.1*  ALBUMIN 4.3 4.1   No results for input(s): LIPASE, AMYLASE in the last 168 hours. Recent Labs  Lab 12/04/19 0015  AMMONIA 31   CBC: Recent Labs  Lab 12/03/19 1806 12/04/19 0015 12/05/19 0216  WBC 11.9* 11.6* 9.9   NEUTROABS 5.3 5.3 3.6  HGB 13.5 13.5 12.7*  HCT 40.3 40.1 38.5*  MCV 93.1 92.0 93.0  PLT 242 234 232   Cardiac Enzymes: No results for input(s): CKTOTAL, CKMB, CKMBINDEX, TROPONINI in the last 168 hours. BNP: Invalid input(s): POCBNP CBG: Recent Labs  Lab 12/05/19 1300 12/05/19 1705 12/05/19 1941 12/05/19 2346 12/06/19 0755  GLUCAP 157* 223* 231* 192* 85   D-Dimer No results for input(s): DDIMER in the last 72 hours. Hgb A1c No results for input(s): HGBA1C in the last 72 hours. Lipid Profile No results for input(s): CHOL, HDL, LDLCALC, TRIG, CHOLHDL, LDLDIRECT in the last 72 hours. Thyroid function studies No results for input(s): TSH, T4TOTAL, T3FREE, THYROIDAB in the last 72 hours.  Invalid input(s): FREET3 Anemia work up No results for input(s): VITAMINB12, FOLATE, FERRITIN, TIBC, IRON, RETICCTPCT in the last 72 hours. Urinalysis    Component Value Date/Time   COLORURINE YELLOW 12/04/2019 0137   APPEARANCEUR HAZY (A) 12/04/2019 0137   LABSPEC 1.011 12/04/2019 0137   PHURINE 8.0 12/04/2019 0137   GLUCOSEU 150 (A) 12/04/2019 0137   HGBUR NEGATIVE 12/04/2019 0137   BILIRUBINUR NEGATIVE 12/04/2019 0137   KETONESUR NEGATIVE 12/04/2019 0137   PROTEINUR NEGATIVE 12/04/2019 0137   NITRITE NEGATIVE 12/04/2019 0137   LEUKOCYTESUR NEGATIVE 12/04/2019 0137   Sepsis Labs Invalid input(s): PROCALCITONIN,  WBC,  LACTICIDVEN Microbiology Recent Results (from the past 240 hour(s))  Culture, blood (routine x 2)     Status: None (Preliminary result)   Collection Time: 12/04/19 12:30 AM   Specimen: BLOOD  Result Value Ref Range Status   Specimen Description BLOOD LEFT HAND  Final   Special Requests   Final    BOTTLES DRAWN AEROBIC AND ANAEROBIC Blood Culture results may not be optimal due to an excessive volume of blood received in culture bottles   Culture   Final    NO GROWTH 2 DAYS Performed at [facility], [place] [address] [place], [place] [address]    Report  Status PENDING  Incomplete  Culture, blood (routine x 2)     Status: None (Preliminary result)   Collection Time: 12/04/19 12:30 AM   Specimen: BLOOD  Result Value Ref Range Status   Specimen Description BLOOD RIGHT HAND  Final   Special Requests   Final    BOTTLES DRAWN AEROBIC AND ANAEROBIC Blood Culture adequate volume   Culture   Final    NO GROWTH 2 DAYS Performed at [facility], [place] [address] [place], [place] [address]    Report Status PENDING  Incomplete  SARS Coronavirus 2 by RT PCR (hospital order, performed in [facility] hospital lab) Nasopharyngeal Nasopharyngeal Swab     Status: None   Collection Time: 12/04/19  1:39 AM   Specimen: Nasopharyngeal Swab  Result Value Ref Range Status   SARS Coronavirus 2 NEGATIVE NEGATIVE Final    Comment: (NOTE) SARS-CoV-2 target nucleic acids are NOT DETECTED. The SARS-CoV-2 RNA is generally detectable in upper and lower respiratory specimens during the acute phase of infection. The lowest concentration of SARS-CoV-2 viral copies this assay can detect is 250 copies / mL. A negative result does not preclude SARS-CoV-2 infection and should not be used as the sole basis for treatment or other patient management decisions.  A negative result may occur with improper specimen collection / handling, submission of specimen other than nasopharyngeal swab, presence of viral mutation(s) within the areas targeted by this assay, and inadequate number of viral copies (<250 copies / mL). A negative result must be combined with clinical observations, patient history, and  epidemiological information. Fact Sheet for Patients:   StrictlyIdeas.no Fact Sheet for Healthcare Providers: BankingDealers.co.za This test is not yet approved or cleared  by the [place] FDA and has been authorized for detection and/or diagnosis of SARS-CoV-2 by FDA under an Emergency Use Authorization (EUA).  This EUA  will remain in effect (meaning this test can be used) for the duration of the COVID-19 declaration under Section 564(b)(1) of the Act, 21 U.S.C. section 360bbb-3(b)(1), unless the authorization is terminated or revoked sooner. Performed at [facility], [place] [address], [place],  [address]     Please note: You were cared for by a hospitalist during your hospital stay. Once you are discharged, your primary care physician will handle any further medical issues. Please note that NO REFILLS for any discharge medications will be authorized once you are discharged, as it is imperative that you return to your primary care physician (or establish a relationship with a primary care physician if you do not have one) for your post hospital discharge needs so that they can reassess your need for medications and monitor your lab values.    Time coordinating discharge: 40 minutes  SIGNED:   Shelly Coss, MD  [facility] 12/06/2019, 11:34 AM Pager [phone]  If 7PM-7AM, please contact night-coverage [email or web address] Password [email or web address]

## 2019-12-06 NOTE — Progress Notes (Signed)
  Speech Language Pathology Treatment: Dysphagia  Patient Details Name: Hayden [name] MRN: [id number] DOB: [birth year] Today's Date: 12/06/2019 Time: 8984-2103 SLP Time Calculation (min) (ACUTE ONLY): 14 min  Assessment / Plan / Recommendation Clinical Impression  Pt demonstrated increased s/s aspiration today, possibly due to increased lethargy today although he has a history of dysphagia. Immediate and delayed coughing with multiple, larger straw sip with thin and continued with smaller sips. Masticated banana without difficulty and coughing perhaps from water prior. An MBS is scheduled today at 12 noon.    HPI HPI: Hayden [name] is a 79 y.o. male hx of DM, GERD, Covid,  fibromyalgia, dementia, depression, HL, here presenting with AMS from [place]. CT Generalized cerebral atrophy, no acute intracranial abnormality. CXR Patchy right basilar consolidation consistent with pneumonia. BSE 05/2019 recommending nectar liquids and Dys 2 (rec d/c to SNF on nectar and upgrade as able).       SLP Plan  MBS       Recommendations  Diet recommendations: Dysphagia 3 (mechanical soft);Thin liquid;Other(comment)(MBS is in 45 min) Liquids provided via: Cup Medication Administration: Whole meds with puree Supervision: Patient able to self feed;Full supervision/cueing for compensatory strategies;Staff to assist with self feeding Compensations: Minimize environmental distractions;Slow rate;Small sips/bites Postural Changes and/or Swallow Maneuvers: Seated upright 90 degrees                Oral Care Recommendations: Oral care BID Follow up Recommendations: Other (comment)(TBD) SLP Visit Diagnosis: Dysphagia, unspecified (R13.10) Plan: MBS       GO                Houston Siren 12/06/2019, 11:12 AM

## 2019-12-06 NOTE — Progress Notes (Signed)
Report called to [facility], family also notified of pts discharge. AVS printed and sent with pt, IV will be removed prior to discharge.

## 2019-12-06 NOTE — TOC Transition Note (Signed)
Transition of Care [facility]) - CM/SW Discharge Note   Patient Details  Name: Hayden [name] MRN: [id number] Date of Birth: [birth year]  Transition of Care [facility] CM/SW Contact:  Angelita Ingles, RN Phone Number: 12/06/2019, 4:03 PM   Clinical Narrative:    Patient transferring back to [facility]. Bedside nurse updated and will call report. Transport has been set up via [place]. There are a few calls ahead of this patient but transport has been set up. CM will sign off.   Final next level of care: Memory Care([facility]) Barriers to Discharge: No Barriers Identified   Patient Goals and CMS Choice   CMS Medicare.gov Compare Post Acute Care list provided to:: (n/a) Choice offered to / list presented to : NA  Discharge Placement                Patient to be transferred to facility by: [place] Name of family member notified: Keddrick Wyne brother Patient and family notified of of transfer: 12/06/19  Discharge Plan and Services                DME Arranged: N/A DME Agency: NA       HH Arranged: NA HH Agency: NA        Social Determinants of Health ([place]) Interventions     Readmission Risk Interventions No flowsheet data found.

## 2019-12-06 NOTE — Progress Notes (Signed)
Modified Barium Swallow Progress Note  Patient Details  Name: Hayden [name] MRN: [id number] Date of Birth: 10-Oct-1940  Today's Date: 12/06/2019  Modified Barium Swallow completed.  Full report located under Chart Review in the Imaging Section.  Brief recommendations include the following:  Clinical Impression  Pt exhibited mild oropharyngeal dysphagia marked by mild aspiration x 1. Orally, bolus cohesion was decreased and transit mildly delayed. Thin barium reached the pyriform sinuses before swallow initiated inconsistently. Partial bolus spilled to valleculae and pyriform sinuses and was aspirated minimally below the level of the vocal cords of oral bolus during consecutive straw sips without awareness. No significant residue. Functional mastication with cracker however lethargy and cognitive deficits warrant chopped meats at this time. Continue thin with straw. SLP demonstrated taking small sips via straw and pt able to return demonstate for repeated trials. Pills whole in puree and continued ST at SNF where pt is being discharged today.      Swallow Evaluation Recommendations       SLP Diet Recommendations: Dysphagia 3 (Mech soft) solids;Thin liquid   Liquid Administration via: Straw;Cup   Medication Administration: Whole meds with puree   Supervision: Patient able to self feed;Full supervision/cueing for compensatory strategies   Compensations: Slow rate;Small sips/bites;Minimize environmental distractions   Postural Changes: Seated upright at 90 degrees   Oral Care Recommendations: Oral care BID        Houston Siren 12/06/2019,2:02 PM   Orbie Pyo Colvin Caroli.Ed Risk analyst [phone] Office [phone]

## 2019-12-06 NOTE — Progress Notes (Signed)
Report was given to on coming RN. No distress noted at the time of report. Awaiting transport to Barium Swallow Exam.

## 2019-12-06 NOTE — Progress Notes (Signed)
[place] to tell that patient would possibly be arriving to facility around midnight and to verify that the patient would be accepted. This RN was told that patient would be accepted if around midnight or a little bit later.

## 2019-12-06 NOTE — NC FL2 (Signed)
[place] LEVEL OF CARE SCREENING TOOL     IDENTIFICATION  Patient Name: Hayden [name] Birthdate: [birth year] Sex: male Admission Date (Current Location): 12/03/2019  [facility] and [place] Number:  Herbalist and Address:  [facility] [place], [place] [address] [place], [place] [address]      Provider Number: [id number]  Attending Physician Name and Address:  Shelly Coss, MD  Relative Name and Phone Number:  Limuel Nieblas [phone]    Current Level of Care: Hospital Recommended Level of Care: [place] Prior Approval Number:    Date Approved/Denied:   PASRR Number:    Discharge Plan: Other (Comment)([facility])    Current Diagnoses: Patient Active Problem List   Diagnosis Date Noted  . Right lower lobe pneumonia 12/04/2019  . Hospice care patient 05/19/2019  . Acute pulmonary embolus ([place]) 05/14/2019  . Acute pulmonary embolism ([place]) 05/14/2019  . Pneumonia due to COVID-19 virus   . Aspiration pneumonitis ([place]) 05/13/2019  . Closed fracture of right hip ([place])   . Femur fracture, right ([place]) 02/10/2019  . Fall   . Acute encephalopathy 07/02/2018  . Dementia without behavioral disturbance ([place]) 05/24/2018  . Pressure injury of skin 05/23/2018  . Cellulitis 04/17/2018  . Cellulitis, scrotum 03/09/2018  . Cellulitis of male genitalia 03/08/2018  . Depression 03/08/2018  . BPH (benign prostatic hyperplasia) 03/08/2018  . Leukocytosis 03/08/2018  . Mild dementia ([place]) 10/23/2017  . Lower urinary tract infectious disease 09/06/2017  . Hyperglycemia 09/06/2017  . ARF (acute renal failure) ([place]) 09/06/2017  . Altered mental status 09/06/2017  . Type 2 diabetes mellitus without complication, with long-term current use of insulin ([place]) 04/02/2017  . 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 Bilateral hand pain 12/09/2016  . 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    Orientation RESPIRATION BLADDER Height & Weight     Self  Normal Incontinent Weight: 61 kg Height:  5\' 5"  (165.1 cm)  BEHAVIORAL SYMPTOMS/MOOD NEUROLOGICAL BOWEL NUTRITION STATUS  Other (Comment)(aggresive with male staff tries to grab and grope male staff) (n/a) Incontinent Diet(Dysphagia 3 with thin liquids)  AMBULATORY STATUS COMMUNICATION OF NEEDS Skin   Total Care Verbally Other (Comment)(Blister noted to left lateral heel -foam dressing)                       Personal Care Assistance Level of Assistance  Bathing, Feeding, Dressing Bathing Assistance: Maximum assistance Feeding assistance: Limited assistance Dressing Assistance: Maximum assistance     Functional Limitations Info  Sight, Hearing, Speech Sight Info: Adequate Hearing Info: Adequate Speech Info: Adequate    SPECIAL CARE FACTORS FREQUENCY  PT (By licensed PT), OT (By licensed OT)     PT Frequency: 5X OT Frequency: 5X            Contractures Contractures Info: Not present    Additional Factors Info  Code Status, Allergies, Psychotropic, Insulin Sliding Scale, Isolation Precautions, Suctioning Needs Code Status Info: DNR Allergies Info: Bee Venom, Glucophage, Shellfish Allergy, Grass Extracts ,Gramineae Pollens, Tobacco Psychotropic Info: Haldol, Aricept, Risperdal,  Zoloft Insulin Sliding Scale Info: Sliding scale insulin three times daily Isolation Precautions Info: None Suctioning Needs: n/a   Current Medications (12/06/2019):  This is the current hospital active medication list Current Facility-Administered Medications  Medication Dose Route Frequency Provider Last Rate Last Admin  . 0.9 %  sodium chloride infusion   Intravenous  Continuous Karmen Bongo, MD 75 mL/hr at 12/06/19 0603 New Bag at 12/06/19 0603  . amLODipine (NORVASC) tablet 10 mg  10 mg Oral Daily Karmen Bongo, MD   10 mg at 12/06/19 1201  . azithromycin (ZITHROMAX) 500 mg in  sodium chloride 0.9 % 250 mL IVPB  500 mg Intravenous QHS Karmen Bongo, MD 250 mL/hr at 12/05/19 2259 500 mg at 12/05/19 2259  . cefTRIAXone (ROCEPHIN) 2 g in sodium chloride 0.9 % 100 mL IVPB  2 g Intravenous QHS Karmen Bongo, MD 200 mL/hr at 12/05/19 2215 2 g at 12/05/19 2215  . donepezil (ARICEPT) tablet 10 mg  10 mg Oral QHS Karmen Bongo, MD   10 mg at 12/05/19 2211  . haloperidol (HALDOL) tablet 5 mg  5 mg Oral Q8H PRN Karmen Bongo, MD      . insulin aspart (novoLOG) injection 0-15 Units  0-15 Units Subcutaneous TID WC Karmen Bongo, MD   3 Units at 12/05/19 1732  . insulin aspart (novoLOG) injection 0-5 Units  0-5 Units Subcutaneous QHS Karmen Bongo, MD   2 Units at 12/05/19 2213  . insulin detemir (LEVEMIR) injection 20 Units  20 Units Subcutaneous QHS Karmen Bongo, MD   20 Units at 12/05/19 2211  . pantoprazole (PROTONIX) EC tablet 40 mg  40 mg Oral Daily Karmen Bongo, MD   40 mg at 12/06/19 1201  . pravastatin (PRAVACHOL) tablet 20 mg  20 mg Oral q1800 Karmen Bongo, MD   20 mg at 12/05/19 1731  . risperiDONE (RISPERDAL) tablet 2 mg  2 mg Oral BID Karmen Bongo, MD   2 mg at 12/06/19 1203  . rivaroxaban (XARELTO) tablet 20 mg  20 mg Oral Q supper Karmen Bongo, MD   20 mg at 12/05/19 1731  . sertraline (ZOLOFT) tablet 25 mg  25 mg Oral Daily Karmen Bongo, MD   25 mg at 12/06/19 1201  . tamsulosin (FLOMAX) capsule 0.4 mg  0.4 mg Oral Daily Karmen Bongo, MD   0.4 mg at 12/06/19 1202  . traZODone (DESYREL) tablet 50 mg  50 mg Oral Q8H PRN Karmen Bongo, MD         Discharge Medications: Please see discharge summary for a list of discharge medications.  Relevant Imaging Results:  Relevant Lab Results:   Additional Information SS# [id number]  Angelita Ingles, RN

## 2019-12-09 LAB — CULTURE, BLOOD (ROUTINE X 2)
Culture: NO GROWTH
Culture: NO GROWTH
Special Requests: ADEQUATE

## 2019-12-25 ENCOUNTER — Other Ambulatory Visit: Payer: Self-pay

## 2019-12-25 ENCOUNTER — Encounter (HOSPITAL_COMMUNITY): Payer: Self-pay

## 2019-12-25 ENCOUNTER — Emergency Department (HOSPITAL_COMMUNITY): Payer: Medicare Other

## 2019-12-25 ENCOUNTER — Emergency Department (HOSPITAL_COMMUNITY)
Admission: EM | Admit: 2019-12-25 | Discharge: 2019-12-25 | Payer: Medicare Other | Attending: Emergency Medicine | Admitting: Emergency Medicine

## 2019-12-25 DIAGNOSIS — T43595A Adverse effect of other antipsychotics and neuroleptics, initial encounter: Secondary | ICD-10-CM | POA: Diagnosis not present

## 2019-12-25 DIAGNOSIS — T50905A Adverse effect of unspecified drugs, medicaments and biological substances, initial encounter: Secondary | ICD-10-CM

## 2019-12-25 DIAGNOSIS — Z86711 Personal history of pulmonary embolism: Secondary | ICD-10-CM | POA: Diagnosis not present

## 2019-12-25 DIAGNOSIS — Z8616 Personal history of COVID-19: Secondary | ICD-10-CM | POA: Insufficient documentation

## 2019-12-25 DIAGNOSIS — Z794 Long term (current) use of insulin: Secondary | ICD-10-CM | POA: Diagnosis not present

## 2019-12-25 DIAGNOSIS — Z8701 Personal history of pneumonia (recurrent): Secondary | ICD-10-CM | POA: Diagnosis not present

## 2019-12-25 DIAGNOSIS — R400 Somnolence: Secondary | ICD-10-CM | POA: Insufficient documentation

## 2019-12-25 DIAGNOSIS — F0390 Unspecified dementia without behavioral disturbance: Secondary | ICD-10-CM | POA: Insufficient documentation

## 2019-12-25 DIAGNOSIS — R531 Weakness: Secondary | ICD-10-CM | POA: Diagnosis not present

## 2019-12-25 DIAGNOSIS — E119 Type 2 diabetes mellitus without complications: Secondary | ICD-10-CM | POA: Diagnosis not present

## 2019-12-25 LAB — BASIC METABOLIC PANEL
Anion gap: 10 (ref 5–15)
BUN: 13 mg/dL (ref 8–23)
CO2: 27 mmol/L (ref 22–32)
Calcium: 9.1 mg/dL (ref 8.9–10.3)
Chloride: 104 mmol/L (ref 98–111)
Creatinine, Ser: 0.86 mg/dL (ref 0.61–1.24)
GFR calc Af Amer: >60 mL/min (ref >60)
GFR calc non Af Amer: >60 mL/min (ref >60)
Glucose, Bld: 119 mg/dL — ABNORMAL HIGH (ref 70–99)
Potassium: 3.8 mmol/L (ref 3.5–5.1)
Sodium: 141 mmol/L (ref 135–145)

## 2019-12-25 LAB — CBC
HCT: 38.3 % — ABNORMAL LOW (ref 39.0–52.0)
Hemoglobin: 12.1 g/dL — ABNORMAL LOW (ref 13.0–17.0)
MCH: 30 pg (ref 26.0–34.0)
MCHC: 31.6 g/dL (ref 30.0–36.0)
MCV: 95 fL (ref 80.0–100.0)
Platelets: 225 10*3/uL (ref 150–400)
RBC: 4.03 MIL/uL — ABNORMAL LOW (ref 4.22–5.81)
RDW: 12.9 % (ref 11.5–15.5)
WBC: 10.5 10*3/uL (ref 4.0–10.5)
nRBC: 0 % (ref 0.0–0.2)

## 2019-12-25 LAB — I-STAT VENOUS BLOOD GAS, ED
Acid-Base Excess: 3 mmol/L — ABNORMAL HIGH (ref 0.0–2.0)
Bicarbonate: 28.5 mmol/L — ABNORMAL HIGH (ref 20.0–28.0)
Calcium, Ion: 1.18 mmol/L (ref 1.15–1.40)
HCT: 35 % — ABNORMAL LOW (ref 39.0–52.0)
Hemoglobin: 11.9 g/dL — ABNORMAL LOW (ref 13.0–17.0)
O2 Saturation: 94 %
Potassium: 3.8 mmol/L (ref 3.5–5.1)
Sodium: 142 mmol/L (ref 135–145)
TCO2: 30 mmol/L (ref 22–32)
pCO2, Ven: 43.8 mmHg — ABNORMAL LOW (ref 44.0–60.0)
pH, Ven: 7.421 (ref 7.250–7.430)
pO2, Ven: 69 mmHg — ABNORMAL HIGH (ref 32.0–45.0)

## 2019-12-25 LAB — URINALYSIS, ROUTINE W REFLEX MICROSCOPIC
Bilirubin Urine: NEGATIVE
Glucose, UA: NEGATIVE mg/dL
Hgb urine dipstick: NEGATIVE
Ketones, ur: NEGATIVE mg/dL
Leukocytes,Ua: NEGATIVE
Nitrite: NEGATIVE
Protein, ur: NEGATIVE mg/dL
Specific Gravity, Urine: 1.014 (ref 1.005–1.030)
pH: 6 (ref 5.0–8.0)

## 2019-12-25 LAB — AMMONIA: Ammonia: 19 umol/L (ref 9–35)

## 2019-12-25 LAB — CBG MONITORING, ED: Glucose-Capillary: 109 mg/dL — ABNORMAL HIGH (ref 70–99)

## 2019-12-25 MED ORDER — SODIUM CHLORIDE 0.9 % IV SOLN: INTRAVENOUS | Status: DC

## 2019-12-25 NOTE — ED Provider Notes (Signed)
[facility] EMERGENCY DEPARTMENT Provider Note   CSN: [id number] Arrival date & time: 12/25/19  4656     History Chief Complaint  Patient presents with  . Weakness    Hayden [name] is a 79 y.o. male.  Patient is an 79 year old male with a history of PE, Covid, pneumonia, diabetes, dementia who lives at a skilled facility presenting today due to weakness and lethargy for the past 3 days per staff.  They state he is just not been himself.  On exam patient denies any chest pain, shortness of breath, cough, abdominal pain, nausea or vomiting.  Patient does indicate that he is tired but will not give any further history.  Looking through patient's medication list he did receive Risperdal 2 hours prior to arrival but is on no other mind altering medications.  The history is provided by the patient, the EMS personnel and medical records. The history is limited by the absence of a caregiver.  Weakness      Past Medical History:  Diagnosis Date  . Candidiasis   . Depression   . Diabetes mellitus   . Enlarged prostate   . Fibromyalgia   . GERD (gastroesophageal reflux disease)    reflux intermittent  . Hyperlipidemia   . 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 Leukemia [facility]) chronic lymphocytic   2008 diagnosed-monitored Dr     Patient Active Problem List   Diagnosis Date Noted  . Right lower lobe pneumonia 12/04/2019  . Hospice care patient 05/19/2019  . Acute pulmonary embolus ([place]) 05/14/2019  . Acute pulmonary embolism ([place]) 05/14/2019  . Pneumonia due to COVID-19 virus   . Aspiration pneumonitis ([place]) 05/13/2019  . Closed fracture of right hip ([place])   . Femur fracture, right ([place]) 02/10/2019  . Fall   . Acute encephalopathy 07/02/2018  . Dementia without behavioral disturbance ([place]) 05/24/2018  . Pressure injury of skin 05/23/2018  . Cellulitis 04/17/2018  . Cellulitis, scrotum 03/09/2018  . Cellulitis  of male genitalia 03/08/2018  . Depression 03/08/2018  . BPH (benign prostatic hyperplasia) 03/08/2018  . Leukocytosis 03/08/2018  . Mild dementia ([place]) 10/23/2017  . Lower urinary tract infectious disease 09/06/2017  . Hyperglycemia 09/06/2017  . ARF (acute renal failure) ([place]) 09/06/2017  . Altered mental status 09/06/2017  . Type 2 diabetes mellitus without complication, with long-term current use of insulin ([place]) 04/02/2017  . 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 Bilateral hand pain 12/09/2016  . CLL (chronic lymphocytic leukemia) ([place]) 09/01/2013    Past Surgical History:  Procedure Laterality Date  . ADENOIDECTOMY    . CARPAL TUNNEL RELEASE     rt hand  . CATARACT EXTRACTION, BILATERAL    . CERVICAL FUSION  1998  . CYSTOSCOPY  07/26/2012   Procedure: CYSTOSCOPY;  Surgeon: Ailene Rud, MD;  Location: [facility];  Service: Urology;  Laterality: N/A;  DIAGNOSTIC CYSTO PROSTATE ULTRASOUND      . FEMUR IM NAIL Right 02/10/2019   Procedure: INTRAMEDULLARY (IM) NAIL FEMORAL;  Surgeon: Hiram Gash, MD;  Location: [facility];  Service: Orthopedics;  Laterality: Right;  . PROSTATE BIOPSY  07/26/2012   Procedure: PROSTATE BIOPSY;  Surgeon: Ailene Rud, MD;  Location: [facility];  Service: Urology;  Laterality: N/A;  . retinal micro aneurysms    . tonsil    . VASECTOMY    . VITRECTOMY  Family History  Problem Relation Age of Onset  . Diabetes Father   . Diabetes Sister   . Diabetes Brother     Social History   Tobacco Use  . Smoking status: Never Smoker  . Smokeless tobacco: Never Used  Vaping Use  . Vaping Use: Never used  Substance Use Topics  . Alcohol use: No  . Drug use: No    Home Medications Prior to Admission medications   Medication Sig Start Date End Date Taking? Authorizing Provider  acetaminophen (TYLENOL) 325 MG tablet Take 2 tablets (650  mg total) by mouth every 6 (six) hours as needed for mild pain (or Fever >/= 101). 05/19/19   Elgergawy, Silver Huguenin, MD  acetaminophen (TYLENOL) 500 MG tablet Take 500 mg by mouth every 4 (four) hours as needed for mild pain, fever or headache.    [provider]  amLODipine (NORVASC) 10 MG tablet Take 1 tablet (10 mg total) by mouth daily. 05/20/19   Elgergawy, Silver Huguenin, MD  celecoxib (CELEBREX) 200 MG capsule Take 200 mg by mouth 2 (two) times daily.    [provider]  Cholecalciferol 50 MCG (2000 UT) CAPS Take 1 capsule by mouth daily.    [provider]  Docusate Sodium 100 MG capsule Take 100 mg by mouth daily.    [provider]  donepezil (ARICEPT) 10 MG tablet Take 1 tablet (10 mg total) by mouth at bedtime. 05/03/18   Cameron Sprang, MD  EPINEPHrine (EPIPEN JR) 0.15 MG/0.3ML injection Inject 0.15 mg into the muscle as needed for anaphylaxis.    [provider]  furosemide (LASIX) 20 MG tablet Take 1 tablet (20 mg total) by mouth daily. 12/03/19   Drenda Freeze, MD  guaiFENesin (ROBITUSSIN) 100 MG/5ML liquid Take 200 mg by mouth every 6 (six) hours as needed for cough.    [provider]  haloperidol (HALDOL) 5 MG tablet Take 5 mg by mouth every 8 (eight) hours as needed for agitation.    [provider]  insulin lispro (HUMALOG) 100 UNIT/ML KwikPen Inject 6 Units into the skin 3 (three) times daily with meals. 10/11/18   [provider]  LEVEMIR FLEXTOUCH 100 UNIT/ML Pen Inject 24 Units into the skin at bedtime. Patient taking differently: Inject 20 Units into the skin at bedtime.  02/16/19   Eugenie Filler, MD  loperamide (IMODIUM) 2 MG capsule Take 2 mg by mouth See admin instructions. Take 2 mg by mouth with each loose stool as needed for diarrhea (do not exceed 8 doses in 24 hours)    [provider]  lovastatin (MEVACOR) 20 MG tablet Take 20 mg by mouth daily.    [provider]  magnesium  hydroxide (MILK OF MAGNESIA) 400 MG/5ML suspension Take 30 mLs by mouth at bedtime as needed for mild constipation.    [provider]  neomycin-bacitracin-polymyxin (NEOSPORIN) [phone] ointment Apply 1 application topically See admin instructions. *Standing order for skin tear, abrasions* Clean area with normal saline. Apply tao, cover with bandaid or gauze and tape. Change as needed until healed.    [provider]  omeprazole (PRILOSEC) 20 MG capsule Take 1 capsule (20 mg total) by mouth daily. 05/11/19   Blanchie Dessert, MD  risperiDONE (RISPERDAL) 2 MG tablet Take 2 mg by mouth 2 (two) times daily.    [provider]  rivaroxaban (XARELTO) 20 MG TABS tablet Take 1 tablet (20 mg total) by mouth daily with supper. Patient will be  on Xarelto loading dose until 06/07/2019, then he will be transitioned to this dose Xarelto 20 mg p.o. once daily on 06/08/2019 06/08/19   Elgergawy, Silver Huguenin, MD  sertraline (ZOLOFT) 25 MG tablet Take 25 mg by mouth daily.     [provider]  tamsulosin (FLOMAX) 0.4 MG CAPS capsule Take 0.4 mg by mouth daily.     [provider]  traZODone (DESYREL) 50 MG tablet Take 50 mg by mouth every 8 (eight) hours as needed (agitation).    [provider]  calcium citrate-vitamin D (CITRACAL+D) 315-200 MG-UNIT per tablet Take 1 tablet by mouth daily.  09/04/11  [provider]  terazosin (HYTRIN) 5 MG capsule Take 5 mg by mouth at bedtime.  09/04/11  [provider]    Allergies    Bee venom, Glucophage [metformin], Shellfish allergy, Grass extracts [gramineae pollens], and Tobacco [tobacco]  Review of Systems   Review of Systems  Unable to perform ROS: Dementia  Neurological: Positive for weakness.    Physical Exam Updated Vital Signs BP (!) 116/52   Pulse 65   Temp 97.7 F (36.5 C) (Oral)   Resp 18   Ht 5\' 5"  (1.651 m)   Wt 61 kg   SpO2 97%   BMI 22.38 kg/m   Physical Exam Vitals and nursing  note reviewed.  Constitutional:      General: He is not in acute distress.    Appearance: He is well-developed.     Comments: Somnolent but will wake up with voice  HENT:     Head: Normocephalic and atraumatic.  Eyes:     Conjunctiva/sclera: Conjunctivae normal.     Comments: Pupils are 55mm bilaterally and sluggishly reactive   Cardiovascular:     Rate and Rhythm: Normal rate and regular rhythm.     Pulses: Normal pulses.     Heart sounds: No murmur heard.   Pulmonary:     Effort: Pulmonary effort is normal. No respiratory distress.     Breath sounds: Normal breath sounds. No wheezing or rales.  Abdominal:     General: There is no distension.     Palpations: Abdomen is soft.     Tenderness: There is no abdominal tenderness. There is no guarding or rebound.  Musculoskeletal:        General: No tenderness. Normal range of motion.     Cervical back: Normal range of motion and neck supple.     Right lower leg: Edema present.     Left lower leg: Edema present.     Comments: 1+ pitting edema in bilateral lower extremities  Skin:    General: Skin is warm and dry.     Findings: No erythema or rash.  Neurological:     Comments: Patient will open his eyes and follow commands appropriately.  He only says 1 or 2 words like yes or no but will not give any other information.  Unable to discern if he has slurred speech.  No notable pronator drift, strength 4 out of 5 throughout  Psychiatric:     Comments: Cooperative and sleepy     ED Results / Procedures / Treatments   Labs (all labs ordered are listed, but only abnormal results are displayed) Labs Reviewed  BASIC METABOLIC PANEL - Abnormal; Notable for the following components:      Result Value   Glucose, Bld 119 (*)    All other components within normal limits  CBC - Abnormal; Notable for the following components:  RBC 4.03 (*)    Hemoglobin 12.1 (*)    HCT 38.3 (*)    All other components within normal limits  CBG  MONITORING, ED - Abnormal; Notable for the following components:   Glucose-Capillary 109 (*)    All other components within normal limits  I-STAT VENOUS BLOOD GAS, ED - Abnormal; Notable for the following components:   pCO2, Ven 43.8 (*)    pO2, Ven 69.0 (*)    Bicarbonate 28.5 (*)    Acid-Base Excess 3.0 (*)    HCT 35.0 (*)    Hemoglobin 11.9 (*)    All other components within normal limits  URINALYSIS, ROUTINE W REFLEX MICROSCOPIC  AMMONIA  CBG MONITORING, ED    EKG EKG Interpretation  Date/Time:  Sunday December 25 2019 08:57:29 EDT Ventricular Rate:  65 PR Interval:    QRS Duration: 104 QT Interval:  425 QTC Calculation: 442 R Axis:   -20 Text Interpretation: Sinus rhythm Borderline left axis deviation Low voltage, precordial leads Abnormal R-wave progression, early transition Baseline wander in lead(s) V3 Partial missing lead(s): V3 No significant change since last tracing Confirmed by Blanchie Dessert [phone]) on 12/25/2019 9:00:49 AM   Radiology CT Head Wo Contrast  Result Date: 12/25/2019 CLINICAL DATA:  Altered mental status. EXAM: CT HEAD WITHOUT CONTRAST TECHNIQUE: Contiguous axial images were obtained from the base of the skull through the vertex without intravenous contrast. COMPARISON:  12/03/2019 FINDINGS: Brain: No significant change in mild-to-moderate enlargement of the ventricles and subarachnoid spaces. Mild-to-moderate patchy white matter low density in both cerebral hemispheres without significant change. No intracranial hemorrhage, mass lesion or CT evidence of acute infarction. Vascular: No hyperdense vessel or unexpected calcification. Skull: Normal. Negative for fracture or focal lesion. Sinuses/Orbits: Status post bilateral cataract extraction. Hypoplastic right frontal sinus with a small retention cyst. Other: None. IMPRESSION: 1. No acute abnormality. 2. Stable mild-to-moderate diffuse cerebral and cerebellar atrophy. 3. Stable mild-to-moderate chronic small  vessel white matter ischemic changes in both cerebral hemispheres. Electronically Signed   By: Claudie Revering M.D.   On: 12/25/2019 10:46   DG Chest Port 1 View  Result Date: 12/25/2019 CLINICAL DATA:  Pt from [place] with ems for weakness and lethargy for the past 3 days per staff, states he has not been himself. Dementia and WC bound. Pt unable to give hx. EXAM: PORTABLE CHEST 1 VIEW COMPARISON:  12/03/2019. FINDINGS: Cardiac silhouette is normal in size. No mediastinal or hilar masses. No convincing adenopathy. Clear lungs.  No pleural effusion or pneumothorax. Skeletal structures are grossly intact. IMPRESSION: No active disease. Electronically Signed   By: Lajean Manes M.D.   On: 12/25/2019 10:29    Procedures Procedures (including critical care time)  Medications Ordered in ED Medications  0.9 %  sodium chloride infusion ( Intravenous New Bag/Given 12/25/19 0936)    ED Course  I have reviewed the triage vital signs and the nursing notes.  Pertinent labs & imaging results that were available during my care of the patient were reviewed by me and considered in my medical decision making (see chart for details).    MDM Rules/Calculators/A&P                          Elderly male with multiple medical problems being brought in today due to lethargy and weakness for the last 3 days.  Patient is in no acute distress on exam has normal vital signs and reassuring blood sugar.  Patient's pupils are 2 mm and sluggishly reactive.  He does not take any opiates at the nursing facility but did receive Risperdal 2 hours prior to arrival.  He has as needed medications for Haldol but does not appear to have received any.  Patient has no fever or evidence for significant infection.  Will continued with altered mental status work-up with head CT to rule out acute process however no focal neurologic signs on exam.  Also will ensure no evidence of dehydration, AKI.  VBG without evidence of  hypercarbia.  Ammonia also pending.  EKG at baseline.  1:10 PM Patient's labs and imaging are all within normal limits.  There is no acute findings on his head CT or his chest x-ray.  Patient's vital signs have remained normal here.  Other than being sleepy patient has no other focal findings.  Suspect patient's behavioral changes are related to medications.  Recommend medication reconciliation at the facility and potential discontinuation of Risperdal or change in dose or frequency.  MDM Number of Diagnoses or Management Options   Amount and/or Complexity of Data Reviewed Clinical lab tests: ordered and reviewed Tests in the radiology section of CPT: ordered and reviewed Tests in the medicine section of CPT: ordered and reviewed Decide to obtain previous medical records or to obtain history from someone other than the patient: yes Obtain history from someone other than the patient: yes Review and summarize past medical records: yes Discuss the patient with other providers: no Independent visualization of images, tracings, or specimens: yes  Risk of Complications, Morbidity, and/or Mortality Presenting problems: moderate Diagnostic procedures: low Management options: low  Patient Progress Patient progress: stable    Final Clinical Impression(s) / ED Diagnoses Final diagnoses:  Somnolence  Adverse effect of drug, initial encounter    Rx / DC Orders ED Discharge Orders    None       Blanchie Dessert, MD 12/25/19 1313

## 2019-12-25 NOTE — ED Triage Notes (Signed)
Pt from [place] with ems for weakness and lethargy for the past 3 days per staff, states he has not been himself. Dementia and WC bound at baseline. Pt alert to voice, oriented x3.   BP 105/67 HR 70 CBG 88

## 2019-12-25 NOTE — Discharge Instructions (Signed)
All the lab work including the urine, chest x-ray, head CT and blood work look okay.  Suspect patient's sleepiness and weakness may be coming from medications and he needs to be evaluated by the facility doctor early this week and potentially some of his medications that cause drowsiness should be discontinued or doses and frequency changed.

## 2019-12-25 NOTE — ED Notes (Signed)
Patient transported to CT 

## 2020-01-16 ENCOUNTER — Encounter (HOSPITAL_COMMUNITY): Payer: Self-pay | Admitting: Internal Medicine

## 2020-01-16 ENCOUNTER — Emergency Department (HOSPITAL_COMMUNITY): Payer: Medicare Other

## 2020-01-16 ENCOUNTER — Other Ambulatory Visit: Payer: Self-pay

## 2020-01-16 ENCOUNTER — Inpatient Hospital Stay (HOSPITAL_COMMUNITY): Payer: Medicare Other

## 2020-01-16 ENCOUNTER — Inpatient Hospital Stay (HOSPITAL_COMMUNITY)
Admission: EM | Admit: 2020-01-16 | Discharge: 2020-01-20 | DRG: 522 | Disposition: A | Discharge status: Skilled Nursing Facility | Payer: Medicare Other | Source: Skilled Nursing Facility | Attending: Internal Medicine | Admitting: Internal Medicine

## 2020-01-16 DIAGNOSIS — Z833 Family history of diabetes mellitus: Secondary | ICD-10-CM

## 2020-01-16 DIAGNOSIS — N400 Enlarged prostate without lower urinary tract symptoms: Secondary | ICD-10-CM | POA: Diagnosis present

## 2020-01-16 DIAGNOSIS — E119 Type 2 diabetes mellitus without complications: Secondary | ICD-10-CM | POA: Diagnosis present

## 2020-01-16 DIAGNOSIS — C9110 Chronic lymphocytic leukemia of B-cell type not having achieved remission: Secondary | ICD-10-CM | POA: Diagnosis present

## 2020-01-16 DIAGNOSIS — Z91013 Allergy to seafood: Secondary | ICD-10-CM

## 2020-01-16 DIAGNOSIS — K219 Gastro-esophageal reflux disease without esophagitis: Secondary | ICD-10-CM | POA: Diagnosis present

## 2020-01-16 DIAGNOSIS — F0390 Unspecified dementia without behavioral disturbance: Secondary | ICD-10-CM | POA: Diagnosis present

## 2020-01-16 DIAGNOSIS — Z794 Long term (current) use of insulin: Secondary | ICD-10-CM | POA: Diagnosis not present

## 2020-01-16 DIAGNOSIS — Z86718 Personal history of other venous thrombosis and embolism: Secondary | ICD-10-CM

## 2020-01-16 DIAGNOSIS — F03A0 Unspecified dementia, mild, without behavioral disturbance, psychotic disturbance, mood disturbance, and anxiety: Secondary | ICD-10-CM | POA: Diagnosis present

## 2020-01-16 DIAGNOSIS — Z86711 Personal history of pulmonary embolism: Secondary | ICD-10-CM

## 2020-01-16 DIAGNOSIS — I10 Essential (primary) hypertension: Secondary | ICD-10-CM | POA: Diagnosis present

## 2020-01-16 DIAGNOSIS — E785 Hyperlipidemia, unspecified: Secondary | ICD-10-CM | POA: Diagnosis present

## 2020-01-16 DIAGNOSIS — W1830XA Fall on same level, unspecified, initial encounter: Secondary | ICD-10-CM | POA: Diagnosis present

## 2020-01-16 DIAGNOSIS — Z66 Do not resuscitate: Secondary | ICD-10-CM | POA: Diagnosis present

## 2020-01-16 DIAGNOSIS — Z981 Arthrodesis status: Secondary | ICD-10-CM

## 2020-01-16 DIAGNOSIS — M79673 Pain in unspecified foot: Secondary | ICD-10-CM

## 2020-01-16 DIAGNOSIS — F0150 Vascular dementia without behavioral disturbance: Secondary | ICD-10-CM | POA: Diagnosis present

## 2020-01-16 DIAGNOSIS — M79671 Pain in right foot: Secondary | ICD-10-CM | POA: Diagnosis present

## 2020-01-16 DIAGNOSIS — R0602 Shortness of breath: Secondary | ICD-10-CM | POA: Diagnosis present

## 2020-01-16 DIAGNOSIS — Y92099 Unspecified place in other non-institutional residence as the place of occurrence of the external cause: Secondary | ICD-10-CM | POA: Diagnosis not present

## 2020-01-16 DIAGNOSIS — Z7901 Long term (current) use of anticoagulants: Secondary | ICD-10-CM | POA: Diagnosis not present

## 2020-01-16 DIAGNOSIS — E860 Dehydration: Secondary | ICD-10-CM | POA: Diagnosis present

## 2020-01-16 DIAGNOSIS — S72001A Fracture of unspecified part of neck of right femur, initial encounter for closed fracture: Secondary | ICD-10-CM | POA: Insufficient documentation

## 2020-01-16 DIAGNOSIS — Z20822 Contact with and (suspected) exposure to covid-19: Secondary | ICD-10-CM | POA: Diagnosis present

## 2020-01-16 DIAGNOSIS — Z09 Encounter for follow-up examination after completed treatment for conditions other than malignant neoplasm: Secondary | ICD-10-CM

## 2020-01-16 DIAGNOSIS — S72002A Fracture of unspecified part of neck of left femur, initial encounter for closed fracture: Principal | ICD-10-CM | POA: Diagnosis present

## 2020-01-16 DIAGNOSIS — Z01811 Encounter for preprocedural respiratory examination: Secondary | ICD-10-CM

## 2020-01-16 DIAGNOSIS — R296 Repeated falls: Secondary | ICD-10-CM

## 2020-01-16 LAB — BASIC METABOLIC PANEL
Anion gap: 10 (ref 5–15)
BUN: 16 mg/dL (ref 8–23)
CO2: 24 mmol/L (ref 22–32)
Calcium: 8.9 mg/dL (ref 8.9–10.3)
Chloride: 105 mmol/L (ref 98–111)
Creatinine, Ser: 0.69 mg/dL (ref 0.61–1.24)
GFR calc Af Amer: >60 mL/min (ref >60)
GFR calc non Af Amer: >60 mL/min (ref >60)
Glucose, Bld: 191 mg/dL — ABNORMAL HIGH (ref 70–99)
Potassium: 3.4 mmol/L — ABNORMAL LOW (ref 3.5–5.1)
Sodium: 139 mmol/L (ref 135–145)

## 2020-01-16 LAB — CBC WITH DIFFERENTIAL/PLATELET
Abs Immature Granulocytes: 0.04 10*3/uL (ref 0.00–0.07)
Basophils Absolute: 0 10*3/uL (ref 0.0–0.1)
Basophils Relative: 0 %
Eosinophils Absolute: 0.1 10*3/uL (ref 0.0–0.5)
Eosinophils Relative: 0 %
HCT: 38.7 % — ABNORMAL LOW (ref 39.0–52.0)
Hemoglobin: 13.1 g/dL (ref 13.0–17.0)
Immature Granulocytes: 0 %
Lymphocytes Relative: 39 %
Lymphs Abs: 6.3 10*3/uL — ABNORMAL HIGH (ref 0.7–4.0)
MCH: 31.6 pg (ref 26.0–34.0)
MCHC: 33.9 g/dL (ref 30.0–36.0)
MCV: 93.3 fL (ref 80.0–100.0)
Monocytes Absolute: 0.4 10*3/uL (ref 0.1–1.0)
Monocytes Relative: 3 %
Neutro Abs: 9.3 10*3/uL — ABNORMAL HIGH (ref 1.7–7.7)
Neutrophils Relative %: 58 %
Platelets: 239 10*3/uL (ref 150–400)
RBC: 4.15 MIL/uL — ABNORMAL LOW (ref 4.22–5.81)
RDW: 12.7 % (ref 11.5–15.5)
WBC: 16.1 10*3/uL — ABNORMAL HIGH (ref 4.0–10.5)
nRBC: 0.2 % (ref 0.0–0.2)

## 2020-01-16 LAB — HEPARIN LEVEL (UNFRACTIONATED)
Heparin Unfractionated: 1.68 IU/mL — ABNORMAL HIGH (ref 0.30–0.70)
Heparin Unfractionated: 1.08 IU/mL — ABNORMAL HIGH (ref 0.30–0.70)

## 2020-01-16 LAB — GLUCOSE, CAPILLARY
Glucose-Capillary: 190 mg/dL — ABNORMAL HIGH (ref 70–99)
Glucose-Capillary: 189 mg/dL — ABNORMAL HIGH (ref 70–99)
Glucose-Capillary: 183 mg/dL — ABNORMAL HIGH (ref 70–99)

## 2020-01-16 LAB — PROTIME-INR
INR: 1.3 — ABNORMAL HIGH (ref 0.8–1.2)
Prothrombin Time: 16 seconds — ABNORMAL HIGH (ref 11.4–15.2)

## 2020-01-16 LAB — TYPE AND SCREEN
ABO/RH(D): A POS
Antibody Screen: NEGATIVE

## 2020-01-16 LAB — APTT
aPTT: 28 seconds (ref 24–36)
aPTT: 80 seconds — ABNORMAL HIGH (ref 24–36)

## 2020-01-16 LAB — HEMOGLOBIN A1C
Hgb A1c MFr Bld: 6.7 % — ABNORMAL HIGH (ref 4.8–5.6)
Mean Plasma Glucose: 145.59 mg/dL

## 2020-01-16 LAB — SARS CORONAVIRUS 2 BY RT PCR (HOSPITAL ORDER, PERFORMED IN ~~LOC~~ HOSPITAL LAB): SARS Coronavirus 2: NEGATIVE

## 2020-01-16 MED ORDER — HYDROCODONE-ACETAMINOPHEN 5-325 MG PO TABS
1.0000 | ORAL_TABLET | Freq: Once | ORAL | Status: AC
  Administered 2020-01-16: 1 via ORAL
  Filled 2020-01-16: qty 1

## 2020-01-16 MED ORDER — SODIUM CHLORIDE 0.9 % IV SOLN: INTRAVENOUS | Status: DC

## 2020-01-16 MED ORDER — MORPHINE SULFATE (PF) 2 MG/ML IV SOLN: 0.5000 mg | INTRAVENOUS | Status: DC | PRN

## 2020-01-16 MED ORDER — HYDROCODONE-ACETAMINOPHEN 5-325 MG PO TABS
1.0000 | ORAL_TABLET | Freq: Four times a day (QID) | ORAL | Status: DC | PRN
  Administered 2020-01-16: 2 via ORAL
  Filled 2020-01-16: qty 2

## 2020-01-16 MED ORDER — AMLODIPINE BESYLATE 10 MG PO TABS
10.0000 mg | ORAL_TABLET | Freq: Every day | ORAL | Status: DC
  Administered 2020-01-16 – 2020-01-20 (×4): 10 mg via ORAL
  Filled 2020-01-16 (×2): qty 1
  Filled 2020-01-16: qty 2
  Filled 2020-01-16: qty 1

## 2020-01-16 MED ORDER — INSULIN ASPART 100 UNIT/ML ~~LOC~~ SOLN
0.0000 [IU] | Freq: Three times a day (TID) | SUBCUTANEOUS | Status: DC
  Administered 2020-01-16 – 2020-01-17 (×2): 3 [IU] via SUBCUTANEOUS
  Administered 2020-01-17 – 2020-01-18 (×2): 5 [IU] via SUBCUTANEOUS
  Administered 2020-01-18 – 2020-01-19 (×3): 3 [IU] via SUBCUTANEOUS
  Administered 2020-01-19: 5 [IU] via SUBCUTANEOUS
  Administered 2020-01-19: 8 [IU] via SUBCUTANEOUS
  Administered 2020-01-20: 5 [IU] via SUBCUTANEOUS
  Administered 2020-01-20: 8 [IU] via SUBCUTANEOUS
  Filled 2020-01-16: qty 0.15

## 2020-01-16 MED ORDER — FENTANYL CITRATE (PF) 100 MCG/2ML IJ SOLN: 50.0000 ug | INTRAMUSCULAR | Status: DC | PRN

## 2020-01-16 MED ORDER — POTASSIUM CHLORIDE CRYS ER 20 MEQ PO TBCR
40.0000 meq | EXTENDED_RELEASE_TABLET | Freq: Once | ORAL | Status: AC
  Administered 2020-01-16: 40 meq via ORAL
  Filled 2020-01-16: qty 2

## 2020-01-16 MED ORDER — INSULIN ASPART 100 UNIT/ML ~~LOC~~ SOLN
0.0000 [IU] | Freq: Every day | SUBCUTANEOUS | Status: DC
  Administered 2020-01-17 – 2020-01-19 (×3): 2 [IU] via SUBCUTANEOUS
  Filled 2020-01-16: qty 0.05

## 2020-01-16 MED ORDER — HEPARIN (PORCINE) 25000 UT/250ML-% IV SOLN
900.0000 [IU]/h | INTRAVENOUS | Status: DC
  Administered 2020-01-16: 900 [IU]/h via INTRAVENOUS
  Filled 2020-01-16 (×3): qty 250

## 2020-01-16 MED ORDER — FUROSEMIDE 20 MG PO TABS
20.0000 mg | ORAL_TABLET | Freq: Every day | ORAL | Status: DC
  Administered 2020-01-16 – 2020-01-20 (×4): 20 mg via ORAL
  Filled 2020-01-16 (×4): qty 1

## 2020-01-16 MED ORDER — DONEPEZIL HCL 10 MG PO TABS
10.0000 mg | ORAL_TABLET | Freq: Every day | ORAL | Status: DC
  Administered 2020-01-16: 10 mg via ORAL
  Filled 2020-01-16: qty 2

## 2020-01-16 MED ORDER — HALOPERIDOL 5 MG PO TABS
5.0000 mg | ORAL_TABLET | Freq: Three times a day (TID) | ORAL | Status: DC | PRN
  Filled 2020-01-16: qty 1

## 2020-01-16 MED ORDER — TAMSULOSIN HCL 0.4 MG PO CAPS
0.4000 mg | ORAL_CAPSULE | Freq: Every day | ORAL | Status: DC
  Administered 2020-01-16 – 2020-01-20 (×4): 0.4 mg via ORAL
  Filled 2020-01-16 (×4): qty 1

## 2020-01-16 MED ORDER — PANTOPRAZOLE SODIUM 40 MG PO TBEC
40.0000 mg | DELAYED_RELEASE_TABLET | Freq: Every day | ORAL | Status: DC
  Administered 2020-01-16 – 2020-01-20 (×4): 40 mg via ORAL
  Filled 2020-01-16 (×4): qty 1

## 2020-01-16 NOTE — Progress Notes (Signed)
Pt stable with no needs though weak and confused overall. Pt playing with gown and bed linen. No s/s of distress at time of bedside rounding.

## 2020-01-16 NOTE — ED Notes (Signed)
Attempted to give report.  Nurse in a patient's room doing a med pass, will call back in 5 minutes.

## 2020-01-16 NOTE — H&P (View-Only) (Signed)
Reason for Consult:Left hip fx Referring Physician: Jacai [name] is an 79 y.o. male.  HPI: Hayden [name] was at the group home where he stays and had an unwitnessed fall. He had immediate pain and could not get up or bear weight. He was brought to the ED where x-ray showed a femoral neck fx and orthopedic surgery was consulted. He c/o pain to that area and "referred pain" to contralateral heel.  Past Medical History:  Diagnosis Date  . Candidiasis   . Depression   . Diabetes mellitus   . Enlarged prostate   . Fibromyalgia   . GERD (gastroesophageal reflux disease)    reflux intermittent  . Hyperlipidemia   . 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 ADENOIDECTOMY    . CARPAL TUNNEL RELEASE     rt hand  . CATARACT EXTRACTION, BILATERAL    . CERVICAL FUSION  1998  . CYSTOSCOPY  07/26/2012   Procedure: CYSTOSCOPY;  Surgeon: Ailene Rud, MD;  Location: [facility];  Service: Urology;  Laterality: N/A;  DIAGNOSTIC CYSTO PROSTATE ULTRASOUND      . FEMUR IM NAIL Right 02/10/2019   Procedure: INTRAMEDULLARY (IM) NAIL FEMORAL;  Surgeon: Hiram Gash, MD;  Location: [facility];  Service: Orthopedics;  Laterality: Right;  . PROSTATE BIOPSY  07/26/2012   Procedure: PROSTATE BIOPSY;  Surgeon: Ailene Rud, MD;  Location: [facility];  Service: Urology;  Laterality: N/A;  . retinal micro aneurysms    . tonsil    . VASECTOMY    . VITRECTOMY      Family History  Problem Relation Age of Onset  . Diabetes Father   . Diabetes Sister   . Diabetes Brother     Social History:  reports that he has never smoked. He has never used smokeless tobacco. He reports that he does not drink alcohol and does not use drugs.  Allergies:  Allergies  Allergen Reactions  . Bee Venom Other (See  Comments)    Bump where stung and took days to go away  . Glucophage [Metformin] Diarrhea    "I will never take it again - stool incontinent"  . Shellfish Allergy Swelling  . Grass Extracts [Gramineae Pollens] Other (See Comments)    Unknown rxn  . Tobacco [Tobacco] Other (See Comments)    Unknown rxn    Medications: I have reviewed the patient's current medications.  Results for orders placed or performed during the hospital encounter of 01/16/20 (from the past 48 hour(s))  Basic metabolic panel     Status: Abnormal   Collection Time: 01/16/20  6:30 AM  Result Value Ref Range   Sodium 139 135 - 145 mmol/L   Potassium 3.4 (L) 3.5 - 5.1 mmol/L   Chloride 105 98 - 111 mmol/L   CO2 24 22 - 32 mmol/L   Glucose, Bld 191 (H) 70 - 99 mg/dL    Comment: Glucose reference range applies only to samples taken after fasting for at least 8 hours.   BUN 16 8 - 23 mg/dL   Creatinine, Ser 0.69 0.61 - 1.24 mg/dL   Calcium 8.9 8.9 - 10.3 mg/dL   GFR calc non Af Amer >60 >60 mL/min   GFR calc Af Amer >60 >60 mL/min   Anion gap 10 5 -  15    Comment: Performed at [facility], [place] [address], [place], [place] [address]  CBC WITH DIFFERENTIAL     Status: Abnormal   Collection Time: 01/16/20  6:30 AM  Result Value Ref Range   WBC 16.1 (H) 4.0 - 10.5 K/uL   RBC 4.15 (L) 4.22 - 5.81 MIL/uL   Hemoglobin 13.1 13.0 - 17.0 g/dL   HCT 38.7 (L) 39 - 52 %   MCV 93.3 80.0 - 100.0 fL   MCH 31.6 26.0 - 34.0 pg   MCHC 33.9 30.0 - 36.0 g/dL   RDW 12.7 11.5 - 15.5 %   Platelets 239 150 - 400 K/uL   nRBC 0.2 0.0 - 0.2 %   Neutrophils Relative % 58 %   Neutro Abs 9.3 (H) 1.7 - 7.7 K/uL   Lymphocytes Relative 39 %   Lymphs Abs 6.3 (H) 0.7 - 4.0 K/uL   Monocytes Relative 3 %   Monocytes Absolute 0.4 0 - 1 K/uL   Eosinophils Relative 0 %   Eosinophils Absolute 0.1 0 - 0 K/uL   Basophils Relative 0 %   Basophils Absolute 0.0 0 - 0 K/uL   Immature Granulocytes 0 %   Abs Immature  Granulocytes 0.04 0.00 - 0.07 K/uL    Comment: Performed at [facility], [place] [address], [place], [place] [address]  Protime-INR     Status: Abnormal   Collection Time: 01/16/20  6:30 AM  Result Value Ref Range   Prothrombin Time 16.0 (H) 11.4 - 15.2 seconds   INR 1.3 (H) 0.8 - 1.2    Comment: (NOTE) INR goal varies based on device and disease states. Performed at [facility], [place] [address] [place], [place] [address]   Type and screen [place]     Status: None   Collection Time: 01/16/20  6:30 AM  Result Value Ref Range   ABO/RH(D) A POS    Antibody Screen NEG    Sample Expiration      01/19/2020,2359 Performed at [facility], [place] [address], [place], [place] [address]   SARS Coronavirus 2 by RT PCR (hospital order, performed in [facility] hospital lab) Nasopharyngeal Nasopharyngeal Swab     Status: None   Collection Time: 01/16/20  6:39 AM   Specimen: Nasopharyngeal Swab  Result Value Ref Range   SARS Coronavirus 2 NEGATIVE NEGATIVE    Comment: (NOTE) SARS-CoV-2 target nucleic acids are NOT DETECTED.  The SARS-CoV-2 RNA is generally detectable in upper and lower respiratory specimens during the acute phase of infection. The lowest concentration of SARS-CoV-2 viral copies this assay can detect is 250 copies / mL. A negative result does not preclude SARS-CoV-2 infection and should not be used as the sole basis for treatment or other patient management decisions.  A negative result may occur with improper specimen collection / handling, submission of specimen other than nasopharyngeal swab, presence of viral mutation(s) within the areas targeted by this assay, and inadequate number of viral copies (<250 copies / mL). A negative result must be combined with clinical observations, patient history, and epidemiological information.  Fact Sheet for Patients:    StrictlyIdeas.no  Fact Sheet for Healthcare Providers: BankingDealers.co.za  This test is not yet approved or  cleared by the [place] FDA and has been authorized for detection and/or diagnosis of SARS-CoV-2 by FDA under an Emergency Use Authorization (EUA).  This EUA will remain in effect (meaning this test can  be used) for the duration of the COVID-19 declaration under Section 564(b)(1) of the Act, 21 U.S.C. section 360bbb-3(b)(1), unless the authorization is terminated or revoked sooner.  Performed at [facility], [place] [address] [place], [place] [address]   Hemoglobin A1c     Status: Abnormal   Collection Time: 01/16/20  9:49 AM  Result Value Ref Range   Hgb A1c MFr Bld 6.7 (H) 4.8 - 5.6 %    Comment: (NOTE) Pre diabetes:          5.7%-6.4%  Diabetes:              >6.4%  Glycemic control for   <7.0% adults with diabetes    Mean Plasma Glucose 145.59 mg/dL    Comment: Performed at [place] [address], [place], [place] [address]  APTT     Status: None   Collection Time: 01/16/20  9:49 AM  Result Value Ref Range   aPTT 28 24 - 36 seconds    Comment: Performed at [facility], [place] [address], [place], [place] [address]  Heparin level (unfractionated)     Status: Abnormal   Collection Time: 01/16/20  9:49 AM  Result Value Ref Range   Heparin Unfractionated 1.68 (H) 0.30 - 0.70 IU/mL    Comment: RESULTS CONFIRMED BY MANUAL DILUTION (NOTE) If heparin results are below expected values, and patient dosage has  been confirmed, suggest follow up testing of antithrombin III levels. Performed at [facility], [place] [address] [place], [place] [address]   Glucose, capillary     Status: Abnormal   Collection Time: 01/16/20 11:55 AM  Result Value Ref Range   Glucose-Capillary 190 (H) 70 - 99 mg/dL    Comment: Glucose reference range applies only to samples  taken after fasting for at least 8 hours.    DG Chest 1 View  Result Date: 01/16/2020 CLINICAL DATA:  Unwitnessed fall. EXAM: CHEST  1 VIEW COMPARISON:  12/25/2019 FINDINGS: The cardiac silhouette, mediastinal and hilar contours are within normal limits and stable. Mild tortuosity and calcification of the thoracic aorta. Low lung volumes with vascular crowding and streaky atelectasis. No definite infiltrates, effusions or pneumothorax. The bony thorax is intact. IMPRESSION: Low lung volumes with vascular crowding and streaky atelectasis. Electronically Signed   By: Marijo Sanes M.D.   On: 01/16/2020 05:46   DG Ankle Complete Left  Result Date: 01/16/2020 CLINICAL DATA:  [place]. Left ankle pain. EXAM: LEFT ANKLE COMPLETE - 3+ VIEW COMPARISON:  None. FINDINGS: The ankle mortise is maintained. No acute ankle fracture is identified. The mid and hindfoot bony structures are intact. Extensive vascular calcifications. IMPRESSION: No acute ankle fracture. Electronically Signed   By: Marijo Sanes M.D.   On: 01/16/2020 05:51   DG Knee Complete 4 Views Left  Result Date: 01/16/2020 CLINICAL DATA:  [place]. Left knee pain. EXAM: LEFT KNEE - COMPLETE 4+ VIEW COMPARISON:  None. FINDINGS: The joint spaces are maintained. Minimal degenerative changes for age. No acute fracture is identified. No definite joint effusion. Vascular calcifications are noted. IMPRESSION: No acute bony findings or joint effusion. Electronically Signed   By: Marijo Sanes M.D.   On: 01/16/2020 05:50   DG Hip Unilat W or Wo Pelvis 2-3 Views Left  Result Date: 01/16/2020 CLINICAL DATA:  Unwitnessed fall. Left hip pain. EXAM: DG HIP (WITH OR WITHOUT PELVIS) 2-3V LEFT COMPARISON:  11/09/2013 FINDINGS: There is a displaced fracture of the left femoral neck  with a varus deformity. Right hip hardware is noted. No complicating features. The pubic symphysis and SI joints are intact. No pelvic fractures or bone lesions. Stable vascular calcifications.  IMPRESSION: Displaced left femoral neck fracture. Electronically Signed   By: Marijo Sanes M.D.   On: 01/16/2020 05:49    Review of Systems  HENT: Negative for ear discharge, ear pain, hearing loss and tinnitus.   Eyes: Negative for photophobia and pain.  Respiratory: Negative for cough and shortness of breath.   Cardiovascular: Negative for chest pain.  Gastrointestinal: Negative for abdominal pain, nausea and vomiting.  Genitourinary: Negative for dysuria, flank pain, frequency and urgency.  Musculoskeletal: Positive for arthralgias (Left hip, right heel). Negative for back pain, myalgias and neck pain.  Neurological: Negative for dizziness and headaches.  Hematological: Does not bruise/bleed easily.  Psychiatric/Behavioral: The patient is not nervous/anxious.    Blood pressure 128/63, pulse 84, temperature 98.2 F (36.8 C), temperature source Oral, resp. rate 14, SpO2 100 %. Physical Exam Constitutional:      General: He is not in acute distress.    Appearance: He is well-developed. He is not diaphoretic.  HENT:     Head: Normocephalic and atraumatic.  Eyes:     General: No scleral icterus.       Right eye: No discharge.        Left eye: No discharge.     Conjunctiva/sclera: Conjunctivae normal.  Cardiovascular:     Rate and Rhythm: Normal rate and regular rhythm.  Pulmonary:     Effort: Pulmonary effort is normal. No respiratory distress.  Musculoskeletal:     Cervical back: Normal range of motion.     Comments: LLE No traumatic wounds, ecchymosis, or rash but lower leg with extensive dressing  Mild TTP hip  No knee or ankle effusion  Knee stable to varus/ valgus and anterior/posterior stress  Sens DPN, SPN, TN intact  Motor EHL, ext, flex, evers 5/5  DP 1+, PT 1+, No significant edema  Skin:    General: Skin is warm and dry.  Neurological:     Mental Status: He is alert.  Psychiatric:        Behavior: Behavior normal.     Assessment/Plan: Left hip fx -- Plan  hip hemi tomorrow with Dr. Percell Miller. Please keep NPO after MN. Right heel pain -- Unsure what to make of this. Not especially tender, just c/o pain. Will get x-rays just to be sure. Multiple medical problems including dementia, CLL, diabetes mellitus type 2, GERD, and chronic LE wounds -- per primary service. Will ask WOC RN to assess wounds.    Lisette Abu, PA-C Orthopedic Surgery [phone] 01/16/2020, 1:51 PM

## 2020-01-16 NOTE — ED Provider Notes (Signed)
[facility] DEPT Provider Note   CSN: [id number] Arrival date & time: 01/16/20  0334     History Chief Complaint  Patient presents with  . Fall    Hayden [name] is a 79 y.o. male with a history of dementia, CLL, diabetes mellitus type 2, GERD who presents to the emergency department from [place] with a chief complaint of fall.  EMS reports that the patient had an unwitnessed fall prior to arrival at 02:20.  His only complaint is left leg pain.  He is oriented x2 and EMS reports that the patient is able to recall the details of the fall.   The patient reports that he fell onto his left side.  He denies hitting his head, headache, LOC, nausea, or vomiting.  His only complaint is his left leg.  He reports maximal pain is in his left knee, but reports that it is sharp, severe, and radiates to the upper and lower leg.  He denies right leg pain, back pain, abdominal pain, chest pain, shortness of breath, numbness, or weakness.  No treatment prior to arrival.  Spoke with staff at [facility].  They report that prior to the last 1 to 2 months that the patient was ambulatory.  He had a right hip fracture in 2020.  Unfortunately, he has been using a wheelchair more over the last 2 months due to wounds on his lower legs.  He is able to stand and pivot and to stand and use the urinal.  They report that he has had progressive decline over the last year. Sister Hayden [name] is his POA.  He is a DNR.   The history is provided by the patient. No language interpreter was used.       Past Medical History:  Diagnosis Date  . Candidiasis   . Depression   . Diabetes mellitus   . Enlarged prostate   . Fibromyalgia   . GERD (gastroesophageal reflux disease)    reflux intermittent  . Hyperlipidemia   . 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 Leukemia [facility]) chronic lymphocytic   2008 diagnosed-monitored Dr     Patient  Active Problem List   Diagnosis Date Noted  . Closed right hip fracture ([place]) 01/16/2020  . Left displaced femoral neck fracture ([place]) 01/16/2020  . Right lower lobe pneumonia 12/04/2019  . Hospice care patient 05/19/2019  . Acute pulmonary embolus ([place]) 05/14/2019  . Acute pulmonary embolism ([place]) 05/14/2019  . Pneumonia due to COVID-19 virus   . Aspiration pneumonitis ([place]) 05/13/2019  . Closed fracture of right hip ([place])   . Femur fracture, right ([place]) 02/10/2019  . Fall   . Acute encephalopathy 07/02/2018  . Dementia without behavioral disturbance ([place]) 05/24/2018  . Pressure injury of skin 05/23/2018  . Cellulitis 04/17/2018  . Cellulitis, scrotum 03/09/2018  . Cellulitis of male genitalia 03/08/2018  . Depression 03/08/2018  . BPH (benign prostatic hyperplasia) 03/08/2018  . Leukocytosis 03/08/2018  . Mild dementia ([place]) 10/23/2017  . Lower urinary tract infectious disease 09/06/2017  . Hyperglycemia 09/06/2017  . ARF (acute renal failure) ([place]) 09/06/2017  . Altered mental status 09/06/2017  . Type 2 diabetes mellitus without complication, with long-term current use of insulin ([place]) 04/02/2017  . 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 Bilateral hand pain 12/09/2016  . 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 ADENOIDECTOMY    . CARPAL TUNNEL RELEASE     rt hand  . CATARACT EXTRACTION, BILATERAL    . CERVICAL FUSION  1998  . CYSTOSCOPY  07/26/2012   Procedure: CYSTOSCOPY;  Surgeon: Ailene Rud, MD;  Location: [facility];  Service: Urology;  Laterality: N/A;  DIAGNOSTIC CYSTO PROSTATE ULTRASOUND      . FEMUR IM NAIL Right 02/10/2019   Procedure: INTRAMEDULLARY (IM) NAIL FEMORAL;  Surgeon: Hiram Gash, MD;  Location: [facility];  Service: Orthopedics;  Laterality: Right;  . PROSTATE BIOPSY  07/26/2012   Procedure: PROSTATE  BIOPSY;  Surgeon: Ailene Rud, MD;  Location: [facility];  Service: Urology;  Laterality: N/A;  . retinal micro aneurysms    . tonsil    . VASECTOMY    . VITRECTOMY         Family History  Problem Relation Age of Onset  . Diabetes Father   . Diabetes Sister   . Diabetes Brother     Social History   Tobacco Use  . Smoking status: Never Smoker  . Smokeless tobacco: Never Used  Vaping Use  . Vaping Use: Never used  Substance Use Topics  . Alcohol use: No  . Drug use: No    Home Medications Prior to Admission medications   Medication Sig Start Date End Date Taking? Authorizing Provider  acetaminophen (TYLENOL) 325 MG tablet Take 2 tablets (650 mg total) by mouth every 6 (six) hours as needed for mild pain (or Fever >/= 101). 05/19/19  Yes Elgergawy, Silver Huguenin, MD  acetaminophen (TYLENOL) 500 MG tablet Take 500 mg by mouth every 4 (four) hours as needed for mild pain, fever or headache.   Yes [provider]  amLODipine (NORVASC) 10 MG tablet Take 1 tablet (10 mg total) by mouth daily. 05/20/19  Yes Elgergawy, Silver Huguenin, MD  celecoxib (CELEBREX) 200 MG capsule Take 200 mg by mouth 2 (two) times daily.   Yes [provider]  Cholecalciferol 50 MCG (2000 UT) CAPS Take 2,000 Units by mouth daily.    Yes [provider]  Docusate Sodium 100 MG capsule Take 100 mg by mouth daily.   Yes [provider]  donepezil (ARICEPT) 10 MG tablet Take 1 tablet (10 mg total) by mouth at bedtime. 05/03/18  Yes Cameron Sprang, MD  furosemide (LASIX) 20 MG tablet Take 1 tablet (20 mg total) by mouth daily. 12/03/19  Yes Drenda Freeze, MD  guaiFENesin (ROBITUSSIN) 100 MG/5ML liquid Take 200 mg by mouth every 6 (six) hours as needed for cough.   Yes [provider]  haloperidol (HALDOL) 5 MG tablet Take 5 mg by mouth every 8 (eight) hours as needed for agitation.   Yes [provider]  Infant Care Products (DERMACLOUD) CREA  Apply 1 application topically in the morning and at bedtime. Buttocks   Yes [provider]  insulin lispro (HUMALOG) 100 UNIT/ML KwikPen Inject 6 Units into the skin 3 (three) times daily with meals. 10/11/18  Yes [provider]  LEVEMIR FLEXTOUCH 100 UNIT/ML Pen Inject 24 Units into the skin at bedtime. 02/16/19  Yes Eugenie Filler, MD  loperamide (IMODIUM) 2 MG capsule Take 2 mg by mouth See admin instructions. Take 2 mg by mouth with each loose stool as needed for diarrhea (do not exceed 8 doses in 24 hours)   Yes [provider]  lovastatin (MEVACOR) 20 MG tablet Take  20 mg by mouth daily.   Yes [provider]  magnesium hydroxide (MILK OF MAGNESIA) 400 MG/5ML suspension Take 30 mLs by mouth at bedtime as needed for mild constipation.   Yes [provider]  neomycin-bacitracin-polymyxin (NEOSPORIN) [id number] ointment Apply 1 application topically See admin instructions. *Standing order for skin tear, abrasions* Clean area with normal saline. Apply tao, cover with bandaid or gauze and tape. Change as needed until healed.   Yes [provider]  omeprazole (PRILOSEC) 20 MG capsule Take 1 capsule (20 mg total) by mouth daily. 05/11/19  Yes Plunkett, Loree Fee, MD  risperiDONE (RISPERDAL) 1 MG tablet Take 1 mg by mouth in the morning and at bedtime. 12/26/19  Yes [provider]  rivaroxaban (XARELTO) 20 MG TABS tablet Take 1 tablet (20 mg total) by mouth daily with supper. Patient will be on Xarelto loading dose until 06/07/2019, then he will be transitioned to this dose Xarelto 20 mg p.o. once daily on 06/08/2019 Patient taking differently: Take 20 mg by mouth daily.  06/08/19  Yes Elgergawy, Silver Huguenin, MD  sertraline (ZOLOFT) 25 MG tablet Take 25 mg by mouth daily.    Yes [provider]  Skin Protectants, Misc. (BAZA PROTECT EX) Apply 1 application topically as needed (incontinence episode). Buttocks and scrotum   Yes [provider]  tamsulosin (FLOMAX) 0.4 MG CAPS capsule Take 0.4 mg by mouth daily.    Yes [provider]  traZODone (DESYREL) 50 MG tablet Take 50 mg by mouth every 8 (eight) hours as needed (agitation/depression).    Yes [provider]  calcium citrate-vitamin D (CITRACAL+D) 315-200 MG-UNIT per tablet Take 1 tablet by mouth daily.  09/04/11  [provider]  terazosin (HYTRIN) 5 MG capsule Take 5 mg by mouth at bedtime.  09/04/11  [provider]    Allergies    Bee venom, Glucophage [metformin], Shellfish allergy, Grass extracts [gramineae pollens], and Tobacco [tobacco]  Review of Systems   Review of Systems  Constitutional: Negative for appetite change, chills, diaphoresis and fever.  HENT: Negative for congestion.   Eyes: Negative for visual disturbance.  Respiratory: Negative for shortness of breath.   Cardiovascular: Negative for chest pain.  Gastrointestinal: Negative for abdominal pain, diarrhea, nausea and vomiting.  Genitourinary: Negative for dysuria.  Musculoskeletal: Positive for arthralgias, gait problem and myalgias. Negative for back pain, joint swelling, neck pain and neck stiffness.  Skin: Negative for rash.  Allergic/Immunologic: Negative for immunocompromised state.  Neurological: Negative for dizziness, syncope, weakness, numbness and headaches.  Psychiatric/Behavioral: Negative for confusion.    Physical Exam Updated Vital Signs BP 135/64   Pulse 90   Temp 98.4 F (36.9 C) (Oral)   Resp 18   SpO2 92%   Physical Exam Vitals and nursing note reviewed.  Constitutional:      Appearance: He is well-developed. He is not ill-appearing or toxic-appearing.     Comments: Chronically ill-appearing  HENT:     Head: Normocephalic.  Eyes:     Conjunctiva/sclera: Conjunctivae normal.  Cardiovascular:     Rate and Rhythm: Normal rate and regular rhythm.     Pulses: Normal pulses.     Heart sounds: Normal heart sounds. No murmur  heard.  No friction rub. No gallop.   Pulmonary:     Effort: Pulmonary effort is normal. No respiratory distress.     Breath sounds: No stridor. No wheezing, rhonchi or rales.  Chest:     Chest wall: No tenderness.  Abdominal:  General: There is no distension.     Palpations: Abdomen is soft. There is no mass.     Tenderness: There is no abdominal tenderness. There is no right CVA tenderness, left CVA tenderness, guarding or rebound.     Hernia: No hernia is present.  Musculoskeletal:     Cervical back: Neck supple.     Comments: Left leg is internally rotated.  There is mild tenderness to palpation to the left hip.  He has significant tenderness to palpation to the left knee diffusely.  There is also diffuse tenderness to palpation to the left ankle.  DP and PT pulses are 2+ and symmetric.  Decreased strength against resistance of the left lower extremity secondary to pain.  Good capillary refill of the left digits.  Normal exam of the right leg.  Spine is nontender to palpation.  Ace wraps noted to the bilateral lower legs.  Skin:    General: Skin is warm and dry.  Neurological:     Mental Status: He is alert.     Comments: Alert and oriented x2.  Psychiatric:        Behavior: Behavior normal.     ED Results / Procedures / Treatments   Labs (all labs ordered are listed, but only abnormal results are displayed) Labs Reviewed  BASIC METABOLIC PANEL - Abnormal; Notable for the following components:      Result Value   Potassium 3.4 (*)    Glucose, Bld 191 (*)    All other components within normal limits  CBC WITH DIFFERENTIAL/PLATELET - Abnormal; Notable for the following components:   WBC 16.1 (*)    RBC 4.15 (*)    HCT 38.7 (*)    Neutro Abs 9.3 (*)    Lymphs Abs 6.3 (*)    All other components within normal limits  PROTIME-INR - Abnormal; Notable for the following components:   Prothrombin Time 16.0 (*)    INR 1.3 (*)    All other components within normal limits    SARS CORONAVIRUS 2 BY RT PCR (HOSPITAL ORDER, [facility])  TYPE AND SCREEN    EKG None  Radiology DG Chest 1 View  Result Date: 01/16/2020 CLINICAL DATA:  Unwitnessed fall. EXAM: CHEST  1 VIEW COMPARISON:  12/25/2019 FINDINGS: The cardiac silhouette, mediastinal and hilar contours are within normal limits and stable. Mild tortuosity and calcification of the thoracic aorta. Low lung volumes with vascular crowding and streaky atelectasis. No definite infiltrates, effusions or pneumothorax. The bony thorax is intact. IMPRESSION: Low lung volumes with vascular crowding and streaky atelectasis. Electronically Signed   By: Marijo Sanes M.D.   On: 01/16/2020 05:46   DG Ankle Complete Left  Result Date: 01/16/2020 CLINICAL DATA:  [place]. Left ankle pain. EXAM: LEFT ANKLE COMPLETE - 3+ VIEW COMPARISON:  None. FINDINGS: The ankle mortise is maintained. No acute ankle fracture is identified. The mid and hindfoot bony structures are intact. Extensive vascular calcifications. IMPRESSION: No acute ankle fracture. Electronically Signed   By: Marijo Sanes M.D.   On: 01/16/2020 05:51   DG Knee Complete 4 Views Left  Result Date: 01/16/2020 CLINICAL DATA:  [place]. Left knee pain. EXAM: LEFT KNEE - COMPLETE 4+ VIEW COMPARISON:  None. FINDINGS: The joint spaces are maintained. Minimal degenerative changes for age. No acute fracture is identified. No definite joint effusion. Vascular calcifications are noted. IMPRESSION: No acute bony findings or joint effusion. Electronically Signed   By: Ricky Stabs.D.  On: 01/16/2020 05:50   DG Hip Unilat W or Wo Pelvis 2-3 Views Left  Result Date: 01/16/2020 CLINICAL DATA:  Unwitnessed fall. Left hip pain. EXAM: DG HIP (WITH OR WITHOUT PELVIS) 2-3V LEFT COMPARISON:  11/09/2013 FINDINGS: There is a displaced fracture of the left femoral neck with a varus deformity. Right hip hardware is noted. No complicating features. The pubic symphysis and SI  joints are intact. No pelvic fractures or bone lesions. Stable vascular calcifications. IMPRESSION: Displaced left femoral neck fracture. Electronically Signed   By: Marijo Sanes M.D.   On: 01/16/2020 05:49    Procedures Procedures (including critical care time)  Medications Ordered in ED Medications  fentaNYL (SUBLIMAZE) injection 50 mcg (has no administration in time range)  HYDROcodone-acetaminophen (NORCO/VICODIN) 5-325 MG per tablet 1 tablet (1 tablet Oral Given 01/16/20 0458)    ED Course  I have reviewed the triage vital signs and the nursing notes.  Pertinent labs & imaging results that were available during my care of the patient were reviewed by me and considered in my medical decision making (see chart for details).    MDM Rules/Calculators/A&P                          79 year old male with a history of dementia, CLL, diabetes mellitus type 2, GERD brought in by EMS from [place] after an unwitnessed fall.  Patient is able to recount the details of the fall.  He denies hitting his head.  He landed on his left side and is complaining of left leg pain.  X-ray with a closed displaced left femoral neck fracture.  Per chart review, patient did have operative repair with Dr. Sallye Lat [facility] of the right hip in 2020.  Patient is a DNR.  Contacted the patient's POA, his sister Hayden [name], and left a voicemail.  Staff at [facility] does note that the patient has had decline, especially over the last year.  Prior to the last month and 1/2 to 2 months, he was ambulatory independently.  However, he has been requiring use of a wheelchair for the last 2 months due to wounds on his lower legs, but is able to stand and pivot and use a urinal.  Consulted orthopedic surgery and spoke with Dr. Percell Miller who will come and evaluate the patient this morning.  Patient will require admission for PT OT and further evaluation from orthopedic surgery.  He is n.p.o.  He does have a new  leukocytosis noted on CBC.  Chest x-ray with some vascular crowding and streaky atelectasis.  Patient has had no increased work of breathing or coughing noted since arrival in the ER.  He has been afebrile without tachycardia.  Initially, EMS did not report that the patient was on anticoagulation.  Patient was previously on Xarelto for PE and previously had a GI bleed back in November 2020.  This medication was thought to be discontinued, but on med rec today he appears to still be taking the medication.  Head is atraumatic.  No evidence of trauma.  Patient has been alert and answering questions appropriately since arrival in the ER.  Consult to the hospitalist team and Dr. Wyline Copas will accept the patient for admission. The patient appears reasonably stabilized for admission considering the current resources, flow, and capabilities available in the ED at this time, and I doubt any other [facility] requiring further screening and/or treatment in the ED prior to admission.  07:30-spoke with the  patient's POA, Hayden [name], and updated her on the patient's left femoral neck fracture.  She has requested that Dr. Percell Miller call and follow-up with her after his evaluation.  In regard to surgical or nonsurgical preferences, she would defer to Dr. Debroah Loop judgment.  She has also requested to be called and regularly updated on the patient's care while he is admitted.  Final Clinical Impression(s) / ED Diagnoses Final diagnoses:  Unwitnessed fall  Closed displaced fracture of left femoral neck [facility])    Rx / DC Orders ED Discharge Orders    None       Joanne Gavel, PA-C 01/16/20 0744    Mesner, Corene Cornea, MD 01/16/20 2302

## 2020-01-16 NOTE — Plan of Care (Signed)
  Problem: Activity: Goal: Ability to ambulate and perform ADLs will improve Outcome: Progressing   Problem: Clinical Measurements: Goal: Postoperative complications will be avoided or minimized Outcome: Progressing   Problem: Self-Concept: Goal: Ability to maintain and perform role responsibilities to the fullest extent possible will improve Outcome: Progressing   Problem: Pain Management: Goal: Pain level will decrease Outcome: Progressing   Problem: Clinical Measurements: Goal: Will remain free from infection Outcome: Progressing   Problem: Activity: Goal: Risk for activity intolerance will decrease Outcome: Progressing   Problem: Pain Managment: Goal: General experience of comfort will improve Outcome: Progressing   Problem: Skin Integrity: Goal: Risk for impaired skin integrity will decrease Outcome: Progressing

## 2020-01-16 NOTE — ED Triage Notes (Signed)
Pt brought in from [facility] by EMS for unwitnessed fall onto left hip at approximately 02:20 this morning. Patient does not take blood thinners and reportedly did not hit head. Patient is AxOx2 at baseline, history of dementia. Patient has pain with palpation to left hip, no deformities and no shortening of leg present.

## 2020-01-16 NOTE — Consult Note (Signed)
I have reviewed his imaging.   Tentative plan is for L hip hemi when medically stable.   OR date TBD.   Formal consult to follow   Renette Butters

## 2020-01-16 NOTE — H&P (Signed)
History and Physical    Hayden [name] GMW:[id number] DOB: 11-17-1940 DOA: 01/16/2020  PCP: Patient, No Pcp Per  Patient coming from: [facility], assisted living  Chief Complaint: Fall, L hip pain  HPI: Hayden [name] is a 79 y.o. male with medical history significant of vascular dementia, DM2, prior R hip fracture s/p surgical repair. Pt presents after mechanical fall on the morning of admit. Pt reports trying to transfer to wheelchair the morning of admit, resulting in mechanical fall onto L side and resultant L hip pain. Pt was brought in ED for further work up  ED Course: In the ED, pt found to have acute L femoral neck fracture on xray. Orthopedic Surgery was consulted. Potassium of 3.4, Cr 0.69, WBC 16.1. Hospitalist consulted for consideration for admission  Review of Systems:  Review of Systems  Constitutional: Negative for fever, malaise/fatigue and weight loss.  HENT: Negative for congestion, ear discharge and ear pain.   Eyes: Negative for double vision and pain.  Respiratory: Negative for hemoptysis, sputum production and shortness of breath.   Cardiovascular: Negative for palpitations, orthopnea and claudication.  Gastrointestinal: Negative for diarrhea, nausea and vomiting.  Genitourinary: Negative for frequency, hematuria and urgency.  Musculoskeletal: Negative for back pain, joint pain and neck pain.  Neurological: Negative for tingling, tremors, seizures, loss of consciousness and weakness.  Psychiatric/Behavioral: Negative for hallucinations and substance abuse. The patient is not nervous/anxious.     Past Medical History:  Diagnosis Date  . Candidiasis   . Depression   . Diabetes mellitus   . Enlarged prostate   . Fibromyalgia   . GERD (gastroesophageal reflux disease)    reflux intermittent  . Hyperlipidemia   . 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 ADENOIDECTOMY    . CARPAL TUNNEL RELEASE     rt hand  . CATARACT EXTRACTION, BILATERAL    . CERVICAL FUSION  1998  . CYSTOSCOPY  07/26/2012   Procedure: CYSTOSCOPY;  Surgeon: Ailene Rud, MD;  [facility]  Service: Urology;  Laterality: N/A;  DIAGNOSTIC CYSTO PROSTATE ULTRASOUND      . FEMUR IM NAIL Right 02/10/2019   Procedure: INTRAMEDULLARY (IM) NAIL FEMORAL;  Surgeon: Hiram Gash, MD;  Location: [facility];  Service: Orthopedics;  Laterality: Right;  . PROSTATE BIOPSY  07/26/2012   Procedure: PROSTATE BIOPSY;  Surgeon: Ailene Rud, MD;  [facility];  Service: Urology;  Laterality: N/A;  . retinal micro aneurysms    . tonsil    . VASECTOMY    . VITRECTOMY       reports that he has never smoked. He has never used smokeless tobacco. He reports that he does not drink alcohol and does not use drugs.  Allergies  Allergen Reactions  . Bee Venom Other (See Comments)    Bump where stung and took days to go away  . Glucophage [Metformin] Diarrhea    "I will never take it again - stool incontinent"  . Shellfish Allergy Swelling  . Grass Extracts [Gramineae Pollens] Other (See Comments)    Unknown rxn  . Tobacco [Tobacco] Other (See Comments)    Unknown rxn    Family History  Problem Relation Age of Onset  . Diabetes Father   . Diabetes Sister   . Diabetes  Brother     Prior to Admission medications   Medication Sig Start Date End Date Taking? Authorizing Provider  acetaminophen (TYLENOL) 325 MG tablet Take 2 tablets (650 mg total) by mouth every 6 (six) hours as needed for mild pain (or Fever >/= 101). 05/19/19  Yes Elgergawy, Silver Huguenin, MD  acetaminophen (TYLENOL) 500 MG tablet Take 500 mg by mouth every 4 (four) hours as needed for mild pain, fever or headache.   Yes [provider]  amLODipine (NORVASC) 10 MG tablet  Take 1 tablet (10 mg total) by mouth daily. 05/20/19  Yes Elgergawy, Silver Huguenin, MD  celecoxib (CELEBREX) 200 MG capsule Take 200 mg by mouth 2 (two) times daily.   Yes [provider]  Cholecalciferol 50 MCG (2000 UT) CAPS Take 2,000 Units by mouth daily.    Yes [provider]  Docusate Sodium 100 MG capsule Take 100 mg by mouth daily.   Yes [provider]  donepezil (ARICEPT) 10 MG tablet Take 1 tablet (10 mg total) by mouth at bedtime. 05/03/18  Yes Cameron Sprang, MD  furosemide (LASIX) 20 MG tablet Take 1 tablet (20 mg total) by mouth daily. 12/03/19  Yes Drenda Freeze, MD  guaiFENesin (ROBITUSSIN) 100 MG/5ML liquid Take 200 mg by mouth every 6 (six) hours as needed for cough.   Yes [provider]  haloperidol (HALDOL) 5 MG tablet Take 5 mg by mouth every 8 (eight) hours as needed for agitation.   Yes [provider]  Infant Care Products (DERMACLOUD) CREA Apply 1 application topically in the morning and at bedtime. Buttocks   Yes [provider]  insulin lispro (HUMALOG) 100 UNIT/ML KwikPen Inject 6 Units into the skin 3 (three) times daily with meals. 10/11/18  Yes [provider]  LEVEMIR FLEXTOUCH 100 UNIT/ML Pen Inject 24 Units into the skin at bedtime. 02/16/19  Yes Eugenie Filler, MD  loperamide (IMODIUM) 2 MG capsule Take 2 mg by mouth See admin instructions. Take 2 mg by mouth with each loose stool as needed for diarrhea (do not exceed 8 doses in 24 hours)   Yes [provider]  lovastatin (MEVACOR) 20 MG tablet Take 20 mg by mouth daily.   Yes [provider]  magnesium hydroxide (MILK OF MAGNESIA) 400 MG/5ML suspension Take 30 mLs by mouth at bedtime as needed for mild constipation.   Yes [provider]  neomycin-bacitracin-polymyxin (NEOSPORIN) [phone] ointment Apply 1 application topically See admin instructions. *Standing order for skin tear, abrasions* Clean area with normal saline.  Apply tao, cover with bandaid or gauze and tape. Change as needed until healed.   Yes [provider]  omeprazole (PRILOSEC) 20 MG capsule Take 1 capsule (20 mg total) by mouth daily. 05/11/19  Yes Plunkett, Loree Fee, MD  risperiDONE (RISPERDAL) 1 MG tablet Take 1 mg by mouth in the morning and at bedtime. 12/26/19  Yes [provider]  rivaroxaban (XARELTO) 20 MG TABS tablet Take 1 tablet (20 mg total) by mouth daily with supper. Patient will be on Xarelto loading dose until 06/07/2019, then he will be transitioned to this dose Xarelto 20 mg p.o. once daily on 06/08/2019 Patient taking differently: Take 20 mg by mouth daily.  06/08/19  Yes Elgergawy, Silver Huguenin, MD  sertraline (ZOLOFT) 25 MG tablet Take 25 mg by mouth daily.    Yes [provider]  Skin Protectants, Misc. (BAZA PROTECT EX) Apply 1 application topically as needed (incontinence episode). Buttocks and scrotum  Yes [provider]  tamsulosin (FLOMAX) 0.4 MG CAPS capsule Take 0.4 mg by mouth daily.    Yes [provider]  traZODone (DESYREL) 50 MG tablet Take 50 mg by mouth every 8 (eight) hours as needed (agitation/depression).    Yes [provider]  calcium citrate-vitamin D (CITRACAL+D) 315-200 MG-UNIT per tablet Take 1 tablet by mouth daily.  09/04/11  [provider]  terazosin (HYTRIN) 5 MG capsule Take 5 mg by mouth at bedtime.  09/04/11  [provider]    Physical Exam: Vitals:   01/16/20 0700 01/16/20 0730 01/16/20 0800 01/16/20 0830  BP: 125/61 135/64 135/70 130/69  Pulse: 87 90 81 78  Resp: 19 18 15    Temp:    98.2 F (36.8 C)  TempSrc:    Oral  SpO2: 98% 92% 96% 97%    Constitutional: NAD, calm, comfortable Vitals:   01/16/20 0700 01/16/20 0730 01/16/20 0800 01/16/20 0830  BP: 125/61 135/64 135/70 130/69  Pulse: 87 90 81 78  Resp: 19 18 15    Temp:    98.2 F (36.8 C)  TempSrc:    Oral  SpO2: 98% 92% 96% 97%   Eyes: PERRL, lids and  conjunctivae normal ENMT: Mucous membranes dry. Posterior pharynx clear of any exudate or lesions.Normal dentition.  Neck: normal, supple, no masses, no thyromegaly Respiratory: clear to auscultation bilaterally, normal resp effort Cardiovascular: Regular rate and rhythm,s1, s2 Abdomen: no tenderness, no masses palpated. No hepatosplenomegaly. Bowel sounds positive.  Musculoskeletal: no clubbing / cyanosis. No joint deformity upper and lower extremities. Good ROM, no contractures. Normal muscle tone.  Skin: no rashes, lesions, ulcers. No induration Neurologic: CN 2-12 grossly intact. Sensation intact, Strength 5/5 in all 4.  Psychiatric: Normal judgment and insight. Alert and oriented x 3. Normal mood.    Labs on Admission: I have personally reviewed following labs and imaging studies  CBC: Recent Labs  Lab 01/16/20 0630  WBC 16.1*  NEUTROABS 9.3*  HGB 13.1  HCT 38.7*  MCV 93.3  PLT 660   Basic Metabolic Panel: Recent Labs  Lab 01/16/20 0630  NA 139  K 3.4*  CL 105  CO2 24  GLUCOSE 191*  BUN 16  CREATININE 0.69  CALCIUM 8.9   GFR: CrCl cannot be calculated (Unknown ideal weight.). Liver Function Tests: No results for input(s): AST, ALT, ALKPHOS, BILITOT, PROT, ALBUMIN in the last 168 hours. No results for input(s): LIPASE, AMYLASE in the last 168 hours. No results for input(s): AMMONIA in the last 168 hours. Coagulation Profile: Recent Labs  Lab 01/16/20 0630  INR 1.3*   Cardiac Enzymes: No results for input(s): CKTOTAL, CKMB, CKMBINDEX, TROPONINI in the last 168 hours. BNP (last 3 results) No results for input(s): PROBNP in the last 8760 hours. HbA1C: No results for input(s): HGBA1C in the last 72 hours. CBG: No results for input(s): GLUCAP in the last 168 hours. Lipid Profile: No results for input(s): CHOL, HDL, LDLCALC, TRIG, CHOLHDL, LDLDIRECT in the last 72 hours. Thyroid Function Tests: No results for input(s): TSH, T4TOTAL, FREET4, T3FREE, THYROIDAB  in the last 72 hours. Anemia Panel: No results for input(s): VITAMINB12, FOLATE, FERRITIN, TIBC, IRON, RETICCTPCT in the last 72 hours. Urine analysis:    Component Value Date/Time   COLORURINE YELLOW 12/25/2019 [place] 12/25/2019 1208   LABSPEC 1.014 12/25/2019 1208   PHURINE 6.0 12/25/2019 1208   GLUCOSEU NEGATIVE 12/25/2019 1208   HGBUR NEGATIVE 12/25/2019 [place] 12/25/2019 1208  KETONESUR NEGATIVE 12/25/2019 [place] 12/25/2019 1208   NITRITE NEGATIVE 12/25/2019 1208   LEUKOCYTESUR NEGATIVE 12/25/2019 1208   Sepsis Labs: !!!!!!!!!!!!!!!!!!!!!!!!!!!!!!!!!!!!!!!!!!!! @LABRCNTIP (procalcitonin:4,lacticidven:4) ) Recent Results (from the past 240 hour(s))  SARS Coronavirus 2 by RT PCR (hospital order, performed in [facility] hospital lab) Nasopharyngeal Nasopharyngeal Swab     Status: None   Collection Time: 01/16/20  6:39 AM   Specimen: Nasopharyngeal Swab  Result Value Ref Range Status   SARS Coronavirus 2 NEGATIVE NEGATIVE Final    Comment: (NOTE) SARS-CoV-2 target nucleic acids are NOT DETECTED.  The SARS-CoV-2 RNA is generally detectable in upper and lower respiratory specimens during the acute phase of infection. The lowest concentration of SARS-CoV-2 viral copies this assay can detect is 250 copies / mL. A negative result does not preclude SARS-CoV-2 infection and should not be used as the sole basis for treatment or other patient management decisions.  A negative result may occur with improper specimen collection / handling, submission of specimen other than nasopharyngeal swab, presence of viral mutation(s) within the areas targeted by this assay, and inadequate number of viral copies (<250 copies / mL). A negative result must be combined with clinical observations, patient history, and epidemiological information.  Fact Sheet for Patients:   StrictlyIdeas.no  Fact Sheet for  Healthcare Providers: BankingDealers.co.za  This test is not yet approved or  cleared by the [place] FDA and has been authorized for detection and/or diagnosis of SARS-CoV-2 by FDA under an Emergency Use Authorization (EUA).  This EUA will remain in effect (meaning this test can be used) for the duration of the COVID-19 declaration under Section 564(b)(1) of the Act, 21 U.S.C. section 360bbb-3(b)(1), unless the authorization is terminated or revoked sooner.  Performed at [facility], [place] [address], [place], [place] [address]      Radiological Exams on Admission: DG Chest 1 View  Result Date: 01/16/2020 CLINICAL DATA:  Unwitnessed fall. EXAM: CHEST  1 VIEW COMPARISON:  12/25/2019 FINDINGS: The cardiac silhouette, mediastinal and hilar contours are within normal limits and stable. Mild tortuosity and calcification of the thoracic aorta. Low lung volumes with vascular crowding and streaky atelectasis. No definite infiltrates, effusions or pneumothorax. The bony thorax is intact. IMPRESSION: Low lung volumes with vascular crowding and streaky atelectasis. Electronically Signed   By: Marijo Sanes M.D.   On: 01/16/2020 05:46   DG Ankle Complete Left  Result Date: 01/16/2020 CLINICAL DATA:  [place]. Left ankle pain. EXAM: LEFT ANKLE COMPLETE - 3+ VIEW COMPARISON:  None. FINDINGS: The ankle mortise is maintained. No acute ankle fracture is identified. The mid and hindfoot bony structures are intact. Extensive vascular calcifications. IMPRESSION: No acute ankle fracture. Electronically Signed   By: Marijo Sanes M.D.   On: 01/16/2020 05:51   DG Knee Complete 4 Views Left  Result Date: 01/16/2020 CLINICAL DATA:  [place]. Left knee pain. EXAM: LEFT KNEE - COMPLETE 4+ VIEW COMPARISON:  None. FINDINGS: The joint spaces are maintained. Minimal degenerative changes for age. No acute fracture is identified. No definite joint effusion. Vascular calcifications are  noted. IMPRESSION: No acute bony findings or joint effusion. Electronically Signed   By: Marijo Sanes M.D.   On: 01/16/2020 05:50   DG Hip Unilat W or Wo Pelvis 2-3 Views Left  Result Date: 01/16/2020 CLINICAL DATA:  Unwitnessed fall. Left hip pain. EXAM: DG HIP (WITH OR WITHOUT PELVIS) 2-3V LEFT COMPARISON:  11/09/2013 FINDINGS: There is a displaced fracture of the left femoral  neck with a varus deformity. Right hip hardware is noted. No complicating features. The pubic symphysis and SI joints are intact. No pelvic fractures or bone lesions. Stable vascular calcifications. IMPRESSION: Displaced left femoral neck fracture. Electronically Signed   By: Marijo Sanes M.D.   On: 01/16/2020 05:49    EKG: Independently reviewed. Sinus  Assessment/Plan Principal Problem:   Left displaced femoral neck fracture (HCC) Active Problems:   CLL (chronic lymphocytic leukemia) (HCC)   Type 2 diabetes mellitus without complication, with long-term current use of insulin (HCC)   Mild dementia (HCC)   Dementia without behavioral disturbance ([place])   DNR (do not resuscitate)   1. L femoral neck fracture 1. S/p mechanical fall 2. Xrays reviewed. Pt with new L femoral neck fracture 3. EDP has consulted orthopedic Surgery 4. Continue analgesia as needed 5. Hold xarelto, transition to heparin gtt for now 6. EKG unremarkable. Will obtain pre-operative CXR 7. F/u CBC and BMP in AM 2. Hx CLL 1. WBC 16, likely mildly elevated secondary to acute fracture 2. F/u CBC in AM 3. Hx PE on xarelto 1. Hold xarelto 2. Start heparin gtt 4. DM2 1. Random glucose stable 2. On insulin prior to admit 3. Will continue SSI Coverage  5. Dementia 1. Seems stable 2. Prior records indicate pt has hx of hypersexuality (hx of attempting to grab and grope male patients) 6. Dehydration 1. Dry mucus membranes on exam 2. Will start basal NS IVF  DVT prophylaxis: Heparin gtt  Code Status: DNR, confirmed by pt's sister over  phone Family Communication: Pt in room  Disposition Plan: Uncertain at this time  Consults called: Orthopedic Surgery Admission status: Inpatient as pt would lkely require greater than 2 midnight stay for work up of acute femoral neck fracture   Marylu Lund MD [facility] Pager On Amion  If 7PM-7AM, please contact night-coverage  01/16/2020, 8:37 AM

## 2020-01-16 NOTE — Progress Notes (Signed)
[place] for heparin Indication: hx pulmonary embolus (home xarelto on hold)  Allergies  Allergen Reactions  . Bee Venom Other (See Comments)    Bump where stung and took days to go away  . Glucophage [Metformin] Diarrhea    "I will never take it again - stool incontinent"  . Shellfish Allergy Swelling  . Grass Extracts [Gramineae Pollens] Other (See Comments)    Unknown rxn  . Tobacco [Tobacco] Other (See Comments)    Unknown rxn    Patient Measurements: height 65 inches, weight 61 kg (recorded on 12/25/19)   Heparin Dosing Weight: 61 kg  Vital Signs: Temp: 98.2 F (36.8 C) (07/19 0830) Temp Source: Oral (07/19 0830) BP: 130/69 (07/19 0830) Pulse Rate: 78 (07/19 0830)  Labs: Recent Labs    01/16/20 0630  HGB 13.1  HCT 38.7*  PLT 239  LABPROT 16.0*  INR 1.3*  CREATININE 0.69    CrCl cannot be calculated (Unknown ideal weight.).   Medications:  - on xarelto 20 mg daily PTA (last dose taken on 7/18 at 0830)  Assessment: Patient's an 79 y.o M with hx PE (05/14/19) on xarelto PTA, presented to the ED on 7/19 s/p fall. X-ray showed closed displaced left femoral neck fracture.  Ortho team recom. left hip hemi. when he's more stable.  He's transitioned to heparin drip on admission in anticipation of procedure.  Today, 01/16/2020: - cbc ok - scr <1  Goal of Therapy:  Heparin level 0.3-0.7 units/ml aPTT 66-102 seconds Monitor platelets by anticoagulation protocol: Yes   Plan:  - baseline aPTT and Heparin level - start heparin drip 900 units/hr - check 8 hr aPTT and Heparin level - monitor for s/s bleeding  Sherran Margolis P 01/16/2020,8:35 AM

## 2020-01-16 NOTE — Progress Notes (Signed)
Hayden [name] for heparin Indication: hx pulmonary embolus (home xarelto on hold)  Allergies  Allergen Reactions  . Bee Venom Other (See Comments)    Bump where stung and took days to go away  . Glucophage [Metformin] Diarrhea    "I will never take it again - stool incontinent"  . Shellfish Allergy Swelling  . Grass Extracts [Gramineae Pollens] Other (See Comments)    Unknown rxn  . Tobacco [Tobacco] Other (See Comments)    Unknown rxn    Patient Measurements: height 65 inches, weight 61 kg (recorded on 12/25/19)   Heparin Dosing Weight: 61 kg  Vital Signs: Temp: 97.8 F (36.6 C) (07/19 1731) Temp Source: Oral (07/19 1731) BP: 119/60 (07/19 1731) Pulse Rate: 95 (07/19 1731)  Labs: Recent Labs    01/16/20 0630 01/16/20 0949 01/16/20 1759  HGB 13.1  --   --   HCT 38.7*  --   --   PLT 239  --   --   APTT  --  28 80*  LABPROT 16.0*  --   --   INR 1.3*  --   --   HEPARINUNFRC  --  1.68* 0.54  CREATININE 0.69  --   --     CrCl cannot be calculated (Unknown ideal weight.).   Medications:  - on xarelto 20 mg daily PTA (last dose taken on 7/18 at 0830)  Assessment: Patient's an 79 y.o M with hx PE (05/14/19) on xarelto PTA, presented to the ED on 7/19 s/p fall. X-ray showed closed displaced left femoral neck fracture.  Ortho team recom. left hip hemi. when he's more stable.  He's transitioned to heparin drip on admission in anticipation of procedure.  Today, 01/16/2020: -aPTT is therapeutic at 80 seconds after heparin drip started at 900 units/hr -heparin level is supra-therapeutic at 1.08 seconds on heparin drip at 900 units/hr- reflects PTA Xarelto  No bleeding reported  Goal of Therapy:  Heparin level 0.3-0.7 units/ml aPTT 66-102 seconds Monitor platelets by anticoagulation protocol: Yes   Plan:  -continue heparin drip at 900 units/hr -daily aPTT and Heparin level, daily CBC - monitor for s/s bleeding  Eudelia Bunch,  Pharm.D 01/16/2020 8:39 PM

## 2020-01-16 NOTE — Consult Note (Signed)
Reason for Consult:Left hip fx Referring Physician: Augusto Deckman is an 79 y.o. male.  HPI: Hayden [name] was at the group home where he stays and had an unwitnessed fall. He had immediate pain and could not get up or bear weight. He was brought to the ED where x-ray showed a femoral neck fx and orthopedic surgery was consulted. He c/o pain to that area and "referred pain" to contralateral heel.  Past Medical History:  Diagnosis Date  . Candidiasis   . Depression   . Diabetes mellitus   . Enlarged prostate   . Fibromyalgia   . GERD (gastroesophageal reflux disease)    reflux intermittent  . Hyperlipidemia   . 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 ADENOIDECTOMY    . CARPAL TUNNEL RELEASE     rt hand  . CATARACT EXTRACTION, BILATERAL    . CERVICAL FUSION  1998  . CYSTOSCOPY  07/26/2012   Procedure: CYSTOSCOPY;  Surgeon: Ailene Rud, MD;  Location: [facility];  Service: Urology;  Laterality: N/A;  DIAGNOSTIC CYSTO PROSTATE ULTRASOUND      . FEMUR IM NAIL Right 02/10/2019   Procedure: INTRAMEDULLARY (IM) NAIL FEMORAL;  Surgeon: Hiram Gash, MD;  Location: [facility];  Service: Orthopedics;  Laterality: Right;  . PROSTATE BIOPSY  07/26/2012   Procedure: PROSTATE BIOPSY;  Surgeon: Ailene Rud, MD;  Location[facility];  Service: Urology;  Laterality: N/A;  . retinal micro aneurysms    . tonsil    . VASECTOMY    . VITRECTOMY      Family History  Problem Relation Age of Onset  . Diabetes Father   . Diabetes Sister   . Diabetes Brother     Social History:  reports that he has never smoked. He has never used smokeless tobacco. He reports that he does not drink alcohol and does not use drugs.  Allergies:  Allergies  Allergen Reactions  . Bee Venom Other (See  Comments)    Bump where stung and took days to go away  . Glucophage [Metformin] Diarrhea    "I will never take it again - stool incontinent"  . Shellfish Allergy Swelling  . Grass Extracts [Gramineae Pollens] Other (See Comments)    Unknown rxn  . Tobacco [Tobacco] Other (See Comments)    Unknown rxn    Medications: I have reviewed the patient's current medications.  Results for orders placed or performed during the hospital encounter of 01/16/20 (from the past 48 hour(s))  Basic metabolic panel     Status: Abnormal   Collection Time: 01/16/20  6:30 AM  Result Value Ref Range   Sodium 139 135 - 145 mmol/L   Potassium 3.4 (L) 3.5 - 5.1 mmol/L   Chloride 105 98 - 111 mmol/L   CO2 24 22 - 32 mmol/L   Glucose, Bld 191 (H) 70 - 99 mg/dL    Comment: Glucose reference range applies only to samples taken after fasting for at least 8 hours.   BUN 16 8 - 23 mg/dL   Creatinine, Ser 0.69 0.61 - 1.24 mg/dL   Calcium 8.9 8.9 - 10.3 mg/dL   GFR calc non Af Amer >60 >60 mL/min   GFR calc Af Amer >60 >60 mL/min   Anion gap 10 5 -  15    Comment: Performed at [facility], [place] [address] [place], [place] [address]  CBC WITH DIFFERENTIAL     Status: Abnormal   Collection Time: 01/16/20  6:30 AM  Result Value Ref Range   WBC 16.1 (H) 4.0 - 10.5 K/uL   RBC 4.15 (L) 4.22 - 5.81 MIL/uL   Hemoglobin 13.1 13.0 - 17.0 g/dL   HCT 38.7 (L) 39 - 52 %   MCV 93.3 80.0 - 100.0 fL   MCH 31.6 26.0 - 34.0 pg   MCHC 33.9 30.0 - 36.0 g/dL   RDW 12.7 11.5 - 15.5 %   Platelets 239 150 - 400 K/uL   nRBC 0.2 0.0 - 0.2 %   Neutrophils Relative % 58 %   Neutro Abs 9.3 (H) 1.7 - 7.7 K/uL   Lymphocytes Relative 39 %   Lymphs Abs 6.3 (H) 0.7 - 4.0 K/uL   Monocytes Relative 3 %   Monocytes Absolute 0.4 0 - 1 K/uL   Eosinophils Relative 0 %   Eosinophils Absolute 0.1 0 - 0 K/uL   Basophils Relative 0 %   Basophils Absolute 0.0 0 - 0 K/uL   Immature Granulocytes 0 %   Abs Immature  Granulocytes 0.04 0.00 - 0.07 K/uL    Comment: Performed at [facility], [place] [address], [place], [place] [address]  Protime-INR     Status: Abnormal   Collection Time: 01/16/20  6:30 AM  Result Value Ref Range   Prothrombin Time 16.0 (H) 11.4 - 15.2 seconds   INR 1.3 (H) 0.8 - 1.2    Comment: (NOTE) INR goal varies based on device and disease states. Performed at [facility], [place] [address], [place], [place] [address]   Type and screen [place]     Status: None   Collection Time: 01/16/20  6:30 AM  Result Value Ref Range   ABO/RH(D) A POS    Antibody Screen NEG    Sample Expiration      01/19/2020,2359 Performed at [facility], [place] [address] [place], [place] [address]   SARS Coronavirus 2 by RT PCR (hospital order, performed in [facility] hospital lab) Nasopharyngeal Nasopharyngeal Swab     Status: None   Collection Time: 01/16/20  6:39 AM   Specimen: Nasopharyngeal Swab  Result Value Ref Range   SARS Coronavirus 2 NEGATIVE NEGATIVE    Comment: (NOTE) SARS-CoV-2 target nucleic acids are NOT DETECTED.  The SARS-CoV-2 RNA is generally detectable in upper and lower respiratory specimens during the acute phase of infection. The lowest concentration of SARS-CoV-2 viral copies this assay can detect is 250 copies / mL. A negative result does not preclude SARS-CoV-2 infection and should not be used as the sole basis for treatment or other patient management decisions.  A negative result may occur with improper specimen collection / handling, submission of specimen other than nasopharyngeal swab, presence of viral mutation(s) within the areas targeted by this assay, and inadequate number of viral copies (<250 copies / mL). A negative result must be combined with clinical observations, patient history, and epidemiological information.  Fact Sheet for Patients:    StrictlyIdeas.no  Fact Sheet for Healthcare Providers: BankingDealers.co.za  This test is not yet approved or  cleared by the [place] FDA and has been authorized for detection and/or diagnosis of SARS-CoV-2 by FDA under an Emergency Use Authorization (EUA).  This EUA will remain in effect (meaning this test can  be used) for the duration of the COVID-19 declaration under Section 564(b)(1) of the Act, 21 U.S.C. section 360bbb-3(b)(1), unless the authorization is terminated or revoked sooner.  Performed at [facility], [place] [address] [place], [place] [address]   Hemoglobin A1c     Status: Abnormal   Collection Time: 01/16/20  9:49 AM  Result Value Ref Range   Hgb A1c MFr Bld 6.7 (H) 4.8 - 5.6 %    Comment: (NOTE) Pre diabetes:          5.7%-6.4%  Diabetes:              >6.4%  Glycemic control for   <7.0% adults with diabetes    Mean Plasma Glucose 145.59 mg/dL    Comment: Performed at [place] [address] [place], [place] [address]  APTT     Status: None   Collection Time: 01/16/20  9:49 AM  Result Value Ref Range   aPTT 28 24 - 36 seconds    Comment: Performed at [facility], [place] [address] [place], [place] [address]  Heparin level (unfractionated)     Status: Abnormal   Collection Time: 01/16/20  9:49 AM  Result Value Ref Range   Heparin Unfractionated 1.68 (H) 0.30 - 0.70 IU/mL    Comment: RESULTS CONFIRMED BY MANUAL DILUTION (NOTE) If heparin results are below expected values, and patient dosage has  been confirmed, suggest follow up testing of antithrombin III levels. Performed at [facility], [place] [address], [place], [place] [address]   Glucose, capillary     Status: Abnormal   Collection Time: 01/16/20 11:55 AM  Result Value Ref Range   Glucose-Capillary 190 (H) 70 - 99 mg/dL    Comment: Glucose reference range applies only to samples  taken after fasting for at least 8 hours.    DG Chest 1 View  Result Date: 01/16/2020 CLINICAL DATA:  Unwitnessed fall. EXAM: CHEST  1 VIEW COMPARISON:  12/25/2019 FINDINGS: The cardiac silhouette, mediastinal and hilar contours are within normal limits and stable. Mild tortuosity and calcification of the thoracic aorta. Low lung volumes with vascular crowding and streaky atelectasis. No definite infiltrates, effusions or pneumothorax. The bony thorax is intact. IMPRESSION: Low lung volumes with vascular crowding and streaky atelectasis. Electronically Signed   By: Marijo Sanes M.D.   On: 01/16/2020 05:46   DG Ankle Complete Left  Result Date: 01/16/2020 CLINICAL DATA:  [place]. Left ankle pain. EXAM: LEFT ANKLE COMPLETE - 3+ VIEW COMPARISON:  None. FINDINGS: The ankle mortise is maintained. No acute ankle fracture is identified. The mid and hindfoot bony structures are intact. Extensive vascular calcifications. IMPRESSION: No acute ankle fracture. Electronically Signed   By: Marijo Sanes M.D.   On: 01/16/2020 05:51   DG Knee Complete 4 Views Left  Result Date: 01/16/2020 CLINICAL DATA:  [place]. Left knee pain. EXAM: LEFT KNEE - COMPLETE 4+ VIEW COMPARISON:  None. FINDINGS: The joint spaces are maintained. Minimal degenerative changes for age. No acute fracture is identified. No definite joint effusion. Vascular calcifications are noted. IMPRESSION: No acute bony findings or joint effusion. Electronically Signed   By: Marijo Sanes M.D.   On: 01/16/2020 05:50   DG Hip Unilat W or Wo Pelvis 2-3 Views Left  Result Date: 01/16/2020 CLINICAL DATA:  Unwitnessed fall. Left hip pain. EXAM: DG HIP (WITH OR WITHOUT PELVIS) 2-3V LEFT COMPARISON:  11/09/2013 FINDINGS: There is a displaced fracture of the left femoral neck  with a varus deformity. Right hip hardware is noted. No complicating features. The pubic symphysis and SI joints are intact. No pelvic fractures or bone lesions. Stable vascular calcifications.  IMPRESSION: Displaced left femoral neck fracture. Electronically Signed   By: Marijo Sanes M.D.   On: 01/16/2020 05:49    Review of Systems  HENT: Negative for ear discharge, ear pain, hearing loss and tinnitus.   Eyes: Negative for photophobia and pain.  Respiratory: Negative for cough and shortness of breath.   Cardiovascular: Negative for chest pain.  Gastrointestinal: Negative for abdominal pain, nausea and vomiting.  Genitourinary: Negative for dysuria, flank pain, frequency and urgency.  Musculoskeletal: Positive for arthralgias (Left hip, right heel). Negative for back pain, myalgias and neck pain.  Neurological: Negative for dizziness and headaches.  Hematological: Does not bruise/bleed easily.  Psychiatric/Behavioral: The patient is not nervous/anxious.    Blood pressure 128/63, pulse 84, temperature 98.2 F (36.8 C), temperature source Oral, resp. rate 14, SpO2 100 %. Physical Exam Constitutional:      General: He is not in acute distress.    Appearance: He is well-developed. He is not diaphoretic.  HENT:     Head: Normocephalic and atraumatic.  Eyes:     General: No scleral icterus.       Right eye: No discharge.        Left eye: No discharge.     Conjunctiva/sclera: Conjunctivae normal.  Cardiovascular:     Rate and Rhythm: Normal rate and regular rhythm.  Pulmonary:     Effort: Pulmonary effort is normal. No respiratory distress.  Musculoskeletal:     Cervical back: Normal range of motion.     Comments: LLE No traumatic wounds, ecchymosis, or rash but lower leg with extensive dressing  Mild TTP hip  No knee or ankle effusion  Knee stable to varus/ valgus and anterior/posterior stress  Sens DPN, SPN, TN intact  Motor EHL, ext, flex, evers 5/5  DP 1+, PT 1+, No significant edema  Skin:    General: Skin is warm and dry.  Neurological:     Mental Status: He is alert.  Psychiatric:        Behavior: Behavior normal.     Assessment/Plan: Left hip fx -- Plan  hip hemi tomorrow with Dr. Percell Miller. Please keep NPO after MN. Right heel pain -- Unsure what to make of this. Not especially tender, just c/o pain. Will get x-rays just to be sure. Multiple medical problems including dementia, CLL, diabetes mellitus type 2, GERD, and chronic LE wounds -- per primary service. Will ask WOC RN to assess wounds.    Lisette Abu, PA-C Orthopedic Surgery [phone] 01/16/2020, 1:51 PM

## 2020-01-16 NOTE — Consult Note (Signed)
I have placed a request via Secure Chat to Dr. Wyline Copas requesting photos of the wound areas of concern to be placed in the EMR.   [place], [place], [place]

## 2020-01-16 NOTE — Progress Notes (Signed)
CXR reviewed. Clear, pt on minimal O2 support. EKG reviewed, nonischemic and unremarkable. Pt without chest pain. Vital signs are stable. At this time, benefit to surgery would outweigh perioperative medical risk.

## 2020-01-16 NOTE — Progress Notes (Signed)
[place] for heparin Indication: hx pulmonary embolus (home xarelto on hold)  Allergies  Allergen Reactions   Bee Venom Other (See Comments)    Bump where stung and took days to go away   Glucophage [Metformin] Diarrhea    "I will never take it again - stool incontinent"   Shellfish Allergy Swelling   Grass Extracts [Gramineae Pollens] Other (See Comments)    Unknown rxn   Tobacco [Tobacco] Other (See Comments)    Unknown rxn    Patient Measurements: height 65 inches, weight 61 kg (recorded on 12/25/19)   Heparin Dosing Weight: 61 kg  Vital Signs: Temp: 97.8 F (36.6 C) (07/19 1731) Temp Source: Oral (07/19 1731) BP: 119/60 (07/19 1731) Pulse Rate: 95 (07/19 1731)  Labs: Recent Labs    01/16/20 0630 01/16/20 0949 01/16/20 1759  HGB 13.1  --   --   HCT 38.7*  --   --   PLT 239  --   --   APTT  --  28 80*  LABPROT 16.0*  --   --   INR 1.3*  --   --   HEPARINUNFRC  --  1.68*  --   CREATININE 0.69  --   --     CrCl cannot be calculated (Unknown ideal weight.).   Medications:  - on xarelto 20 mg daily PTA (last dose taken on 7/18 at 0830)  Assessment: Patient's an 79 y.o M with hx PE (05/14/19) on xarelto PTA, presented to the ED on 7/19 s/p fall. X-ray showed closed displaced left femoral neck fracture.  Ortho team recom. left hip hemi. when he's more stable.  He's transitioned to heparin drip on admission in anticipation of procedure.  Today, 01/16/2020: -aPTT is therapeutic at 80 seconds after heparin drip started at 900 units/hr -heparin level is therapeutic at 0.54 seconds on heparin drip at 900 units/hr No bleeding reported  Goal of Therapy:  Heparin level 0.3-0.7 units/ml aPTT 66-102 seconds Monitor platelets by anticoagulation protocol: Yes   Plan:  -continue heparin drip at 900 units/hr -daily aPTT and Heparin level, daily CBC - monitor for s/s bleeding  Eudelia Bunch, Pharm.D 01/16/2020 6:41  PM

## 2020-01-17 ENCOUNTER — Inpatient Hospital Stay (HOSPITAL_COMMUNITY): Payer: Medicare Other

## 2020-01-17 ENCOUNTER — Inpatient Hospital Stay (HOSPITAL_COMMUNITY): Payer: Medicare Other | Admitting: Anesthesiology

## 2020-01-17 ENCOUNTER — Encounter (HOSPITAL_COMMUNITY)
Admission: EM | Disposition: A | Discharge status: Skilled Nursing Facility | Payer: Self-pay | Source: Skilled Nursing Facility | Attending: Internal Medicine

## 2020-01-17 ENCOUNTER — Encounter (HOSPITAL_COMMUNITY): Payer: Self-pay | Admitting: Internal Medicine

## 2020-01-17 HISTORY — PX: HIP ARTHROPLASTY: SHX981

## 2020-01-17 LAB — APTT: aPTT: 88 seconds — ABNORMAL HIGH (ref 24–36)

## 2020-01-17 LAB — HEPARIN LEVEL (UNFRACTIONATED): Heparin Unfractionated: 0.79 IU/mL — ABNORMAL HIGH (ref 0.30–0.70)

## 2020-01-17 LAB — GLUCOSE, CAPILLARY
Glucose-Capillary: 177 mg/dL — ABNORMAL HIGH (ref 70–99)
Glucose-Capillary: 175 mg/dL — ABNORMAL HIGH (ref 70–99)
Glucose-Capillary: 229 mg/dL — ABNORMAL HIGH (ref 70–99)
Glucose-Capillary: 229 mg/dL — ABNORMAL HIGH (ref 70–99)

## 2020-01-17 LAB — CBC
HCT: 37.9 % — ABNORMAL LOW (ref 39.0–52.0)
Hemoglobin: 11.9 g/dL — ABNORMAL LOW (ref 13.0–17.0)
MCH: 30.4 pg (ref 26.0–34.0)
MCHC: 31.4 g/dL (ref 30.0–36.0)
MCV: 96.9 fL (ref 80.0–100.0)
Platelets: 188 10*3/uL (ref 150–400)
RBC: 3.91 MIL/uL — ABNORMAL LOW (ref 4.22–5.81)
RDW: 12.8 % (ref 11.5–15.5)
WBC: 12 10*3/uL — ABNORMAL HIGH (ref 4.0–10.5)
nRBC: 0 % (ref 0.0–0.2)

## 2020-01-17 LAB — BASIC METABOLIC PANEL
Anion gap: 7 (ref 5–15)
BUN: 15 mg/dL (ref 8–23)
CO2: 22 mmol/L (ref 22–32)
Calcium: 8.6 mg/dL — ABNORMAL LOW (ref 8.9–10.3)
Chloride: 105 mmol/L (ref 98–111)
Creatinine, Ser: 0.56 mg/dL — ABNORMAL LOW (ref 0.61–1.24)
GFR calc Af Amer: >60 mL/min (ref >60)
GFR calc non Af Amer: >60 mL/min (ref >60)
Glucose, Bld: 206 mg/dL — ABNORMAL HIGH (ref 70–99)
Potassium: 3.9 mmol/L (ref 3.5–5.1)
Sodium: 134 mmol/L — ABNORMAL LOW (ref 135–145)

## 2020-01-17 LAB — MRSA PCR SCREENING: MRSA by PCR: NEGATIVE

## 2020-01-17 SURGERY — HEMIARTHROPLASTY, HIP, DIRECT ANTERIOR APPROACH, FOR FRACTURE
Anesthesia: General | Site: Hip | Laterality: Left

## 2020-01-17 MED ORDER — DEXAMETHASONE SODIUM PHOSPHATE 10 MG/ML IJ SOLN
INTRAMUSCULAR | Status: AC
  Filled 2020-01-17: qty 1

## 2020-01-17 MED ORDER — CHLORHEXIDINE GLUCONATE 4 % EX LIQD: 60.0000 mL | Freq: Once | CUTANEOUS | Status: DC

## 2020-01-17 MED ORDER — BUPIVACAINE LIPOSOME 1.3 % IJ SUSP
INTRAMUSCULAR | Status: DC | PRN
  Administered 2020-01-17: 20 mL

## 2020-01-17 MED ORDER — EPHEDRINE SULFATE 50 MG/ML IJ SOLN
INTRAMUSCULAR | Status: DC | PRN
  Administered 2020-01-17 (×2): 10 mg via INTRAVENOUS

## 2020-01-17 MED ORDER — ALBUMIN HUMAN 5 % IV SOLN
INTRAVENOUS | Status: AC
  Filled 2020-01-17: qty 250

## 2020-01-17 MED ORDER — PHENYLEPHRINE 40 MCG/ML (10ML) SYRINGE FOR IV PUSH (FOR BLOOD PRESSURE SUPPORT)
PREFILLED_SYRINGE | INTRAVENOUS | Status: AC
  Filled 2020-01-17: qty 10

## 2020-01-17 MED ORDER — HYDROCODONE-ACETAMINOPHEN 5-325 MG PO TABS
1.0000 | ORAL_TABLET | Freq: Four times a day (QID) | ORAL | Status: DC | PRN
  Administered 2020-01-18 (×2): 1 via ORAL
  Filled 2020-01-17 (×2): qty 1

## 2020-01-17 MED ORDER — SUGAMMADEX SODIUM 200 MG/2ML IV SOLN
INTRAVENOUS | Status: DC | PRN
  Administered 2020-01-17: 200 mg via INTRAVENOUS

## 2020-01-17 MED ORDER — MIDAZOLAM HCL 2 MG/2ML IJ SOLN
INTRAMUSCULAR | Status: AC
  Filled 2020-01-17: qty 2

## 2020-01-17 MED ORDER — PHENOL 1.4 % MT LIQD: 1.0000 | OROMUCOSAL | Status: DC | PRN

## 2020-01-17 MED ORDER — DOCUSATE SODIUM 100 MG PO CAPS
100.0000 mg | ORAL_CAPSULE | Freq: Two times a day (BID) | ORAL | Status: DC
  Administered 2020-01-17 – 2020-01-20 (×6): 100 mg via ORAL
  Filled 2020-01-17 (×6): qty 1

## 2020-01-17 MED ORDER — ALBUMIN HUMAN 5 % IV SOLN: INTRAVENOUS | Status: DC | PRN

## 2020-01-17 MED ORDER — RISPERIDONE 1 MG PO TABS
1.0000 mg | ORAL_TABLET | Freq: Every day | ORAL | Status: DC
  Administered 2020-01-17 – 2020-01-19 (×3): 1 mg via ORAL
  Filled 2020-01-17 (×3): qty 1

## 2020-01-17 MED ORDER — TRANEXAMIC ACID-NACL 1000-0.7 MG/100ML-% IV SOLN
1000.0000 mg | INTRAVENOUS | Status: AC
  Administered 2020-01-17: 1000 mg via INTRAVENOUS

## 2020-01-17 MED ORDER — OXYCODONE HCL 5 MG/5ML PO SOLN: 5.0000 mg | Freq: Once | ORAL | Status: DC | PRN

## 2020-01-17 MED ORDER — ONDANSETRON HCL 4 MG/2ML IJ SOLN: 4.0000 mg | Freq: Four times a day (QID) | INTRAMUSCULAR | Status: DC | PRN

## 2020-01-17 MED ORDER — POVIDONE-IODINE 10 % EX SWAB
2.0000 "application " | Freq: Once | CUTANEOUS | Status: AC
  Administered 2020-01-17: 2 via TOPICAL

## 2020-01-17 MED ORDER — BISACODYL 5 MG PO TBEC: 5.0000 mg | DELAYED_RELEASE_TABLET | Freq: Every day | ORAL | Status: DC | PRN

## 2020-01-17 MED ORDER — ROCURONIUM BROMIDE 10 MG/ML (PF) SYRINGE
PREFILLED_SYRINGE | INTRAVENOUS | Status: AC
  Filled 2020-01-17: qty 10

## 2020-01-17 MED ORDER — OXYCODONE HCL 5 MG PO TABS: 5.0000 mg | ORAL_TABLET | Freq: Once | ORAL | Status: DC | PRN

## 2020-01-17 MED ORDER — METOCLOPRAMIDE HCL 5 MG PO TABS: 5.0000 mg | ORAL_TABLET | Freq: Three times a day (TID) | ORAL | Status: DC | PRN

## 2020-01-17 MED ORDER — ONDANSETRON HCL 4 MG PO TABS: 4.0000 mg | ORAL_TABLET | Freq: Four times a day (QID) | ORAL | Status: DC | PRN

## 2020-01-17 MED ORDER — DEXAMETHASONE SODIUM PHOSPHATE 10 MG/ML IJ SOLN
INTRAMUSCULAR | Status: DC | PRN
  Administered 2020-01-17: 4 mg via INTRAVENOUS

## 2020-01-17 MED ORDER — SERTRALINE HCL 25 MG PO TABS
25.0000 mg | ORAL_TABLET | Freq: Every day | ORAL | Status: DC
  Administered 2020-01-18 – 2020-01-20 (×3): 25 mg via ORAL
  Filled 2020-01-17 (×3): qty 1

## 2020-01-17 MED ORDER — TRAZODONE HCL 50 MG PO TABS: 50.0000 mg | ORAL_TABLET | Freq: Three times a day (TID) | ORAL | Status: DC | PRN

## 2020-01-17 MED ORDER — ONDANSETRON HCL 4 MG/2ML IJ SOLN
INTRAMUSCULAR | Status: AC
  Filled 2020-01-17: qty 2

## 2020-01-17 MED ORDER — ROCURONIUM BROMIDE 100 MG/10ML IV SOLN
INTRAVENOUS | Status: DC | PRN
  Administered 2020-01-17: 60 mg via INTRAVENOUS

## 2020-01-17 MED ORDER — SODIUM CHLORIDE 0.9 % IV SOLN: 20.0000 mL | Freq: Once | INTRAVENOUS | Status: DC

## 2020-01-17 MED ORDER — PHENYLEPHRINE HCL (PRESSORS) 10 MG/ML IV SOLN
INTRAVENOUS | Status: DC | PRN
  Administered 2020-01-17: 60 ug via INTRAVENOUS
  Administered 2020-01-17: 80 ug via INTRAVENOUS

## 2020-01-17 MED ORDER — TRANEXAMIC ACID 1000 MG/10ML IV SOLN
2000.0000 mg | Freq: Once | INTRAVENOUS | Status: AC
  Administered 2020-01-17: 2000 mg via TOPICAL
  Filled 2020-01-17: qty 20

## 2020-01-17 MED ORDER — 0.9 % SODIUM CHLORIDE (POUR BTL) OPTIME
TOPICAL | Status: DC | PRN
  Administered 2020-01-17: 1000 mL

## 2020-01-17 MED ORDER — STERILE WATER FOR IRRIGATION IR SOLN
Status: DC | PRN
  Administered 2020-01-17: 2000 mL

## 2020-01-17 MED ORDER — ONDANSETRON HCL 4 MG/2ML IJ SOLN
INTRAMUSCULAR | Status: DC | PRN
  Administered 2020-01-17: 4 mg via INTRAVENOUS

## 2020-01-17 MED ORDER — LACTATED RINGERS IV SOLN: INTRAVENOUS | Status: DC

## 2020-01-17 MED ORDER — SODIUM CHLORIDE (PF) 0.9 % IJ SOLN
INTRAMUSCULAR | Status: AC
  Filled 2020-01-17: qty 50

## 2020-01-17 MED ORDER — FENTANYL CITRATE (PF) 100 MCG/2ML IJ SOLN
INTRAMUSCULAR | Status: AC
  Filled 2020-01-17: qty 2

## 2020-01-17 MED ORDER — PROPOFOL 500 MG/50ML IV EMUL
INTRAVENOUS | Status: DC | PRN
  Administered 2020-01-17: 70 mg via INTRAVENOUS

## 2020-01-17 MED ORDER — LIDOCAINE 2% (20 MG/ML) 5 ML SYRINGE
INTRAMUSCULAR | Status: AC
  Filled 2020-01-17: qty 5

## 2020-01-17 MED ORDER — PHENYLEPHRINE HCL-NACL 10-0.9 MG/250ML-% IV SOLN
INTRAVENOUS | Status: DC | PRN
Start: 2020-01-17 — End: 2020-01-17
  Administered 2020-01-17: 50 ug/min via INTRAVENOUS

## 2020-01-17 MED ORDER — METOCLOPRAMIDE HCL 5 MG/ML IJ SOLN: 5.0000 mg | Freq: Three times a day (TID) | INTRAMUSCULAR | Status: DC | PRN

## 2020-01-17 MED ORDER — SODIUM CHLORIDE 0.9% FLUSH
INTRAVENOUS | Status: DC | PRN
  Administered 2020-01-17: 30 mL via INTRAVENOUS

## 2020-01-17 MED ORDER — TRANEXAMIC ACID-NACL 1000-0.7 MG/100ML-% IV SOLN
INTRAVENOUS | Status: AC
  Filled 2020-01-17: qty 100

## 2020-01-17 MED ORDER — EPHEDRINE 5 MG/ML INJ
INTRAVENOUS | Status: AC
  Filled 2020-01-17: qty 10

## 2020-01-17 MED ORDER — PRAVASTATIN SODIUM 20 MG PO TABS
20.0000 mg | ORAL_TABLET | Freq: Every day | ORAL | Status: DC
  Administered 2020-01-17 – 2020-01-19 (×3): 20 mg via ORAL
  Filled 2020-01-17 (×3): qty 1

## 2020-01-17 MED ORDER — CEFAZOLIN SODIUM-DEXTROSE 2-4 GM/100ML-% IV SOLN
INTRAVENOUS | Status: AC
  Filled 2020-01-17: qty 100

## 2020-01-17 MED ORDER — MORPHINE SULFATE (PF) 2 MG/ML IV SOLN: 0.5000 mg | INTRAVENOUS | Status: DC | PRN

## 2020-01-17 MED ORDER — MEPERIDINE HCL 50 MG/ML IJ SOLN: 6.2500 mg | INTRAMUSCULAR | Status: DC | PRN

## 2020-01-17 MED ORDER — HYDROCODONE-ACETAMINOPHEN 7.5-325 MG PO TABS: 1.0000 | ORAL_TABLET | Freq: Four times a day (QID) | ORAL | Status: DC | PRN

## 2020-01-17 MED ORDER — ACETAMINOPHEN 325 MG PO TABS: 325.0000 mg | ORAL_TABLET | ORAL | Status: DC | PRN

## 2020-01-17 MED ORDER — FENTANYL CITRATE (PF) 100 MCG/2ML IJ SOLN: 25.0000 ug | INTRAMUSCULAR | Status: DC | PRN

## 2020-01-17 MED ORDER — BUPIVACAINE LIPOSOME 1.3 % IJ SUSP
20.0000 mL | Freq: Once | INTRAMUSCULAR | Status: DC
  Filled 2020-01-17: qty 20

## 2020-01-17 MED ORDER — FENTANYL CITRATE (PF) 100 MCG/2ML IJ SOLN
INTRAMUSCULAR | Status: DC | PRN
  Administered 2020-01-17: 25 ug via INTRAVENOUS
  Administered 2020-01-17: 50 ug via INTRAVENOUS
  Administered 2020-01-17 (×4): 25 ug via INTRAVENOUS

## 2020-01-17 MED ORDER — INSULIN DETEMIR 100 UNIT/ML ~~LOC~~ SOLN
10.0000 [IU] | Freq: Every day | SUBCUTANEOUS | Status: DC
  Administered 2020-01-17 – 2020-01-19 (×3): 10 [IU] via SUBCUTANEOUS
  Filled 2020-01-17 (×4): qty 0.1

## 2020-01-17 MED ORDER — POLYETHYLENE GLYCOL 3350 17 G PO PACK: 17.0000 g | PACK | Freq: Every day | ORAL | Status: DC | PRN

## 2020-01-17 MED ORDER — CEFAZOLIN SODIUM-DEXTROSE 2-4 GM/100ML-% IV SOLN
2.0000 g | Freq: Four times a day (QID) | INTRAVENOUS | Status: AC
  Administered 2020-01-17 – 2020-01-18 (×2): 2 g via INTRAVENOUS
  Filled 2020-01-17 (×2): qty 100

## 2020-01-17 MED ORDER — ACETAMINOPHEN 500 MG PO TABS
500.0000 mg | ORAL_TABLET | Freq: Four times a day (QID) | ORAL | Status: DC
  Administered 2020-01-17 – 2020-01-20 (×12): 500 mg via ORAL
  Filled 2020-01-17 (×10): qty 1

## 2020-01-17 MED ORDER — ONDANSETRON HCL 4 MG/2ML IJ SOLN: 4.0000 mg | Freq: Once | INTRAMUSCULAR | Status: DC | PRN

## 2020-01-17 MED ORDER — MENTHOL 3 MG MT LOZG: 1.0000 | LOZENGE | OROMUCOSAL | Status: DC | PRN

## 2020-01-17 MED ORDER — ACETAMINOPHEN 160 MG/5ML PO SOLN: 325.0000 mg | ORAL | Status: DC | PRN

## 2020-01-17 MED ORDER — CEFAZOLIN SODIUM-DEXTROSE 2-4 GM/100ML-% IV SOLN
2.0000 g | INTRAVENOUS | Status: AC
  Administered 2020-01-17: 2 g via INTRAVENOUS

## 2020-01-17 MED ORDER — MAGNESIUM CITRATE PO SOLN: 1.0000 | Freq: Once | ORAL | Status: DC | PRN

## 2020-01-17 MED ORDER — LIDOCAINE HCL (CARDIAC) PF 100 MG/5ML IV SOSY
PREFILLED_SYRINGE | INTRAVENOUS | Status: DC | PRN
  Administered 2020-01-17: 20 mg via INTRAVENOUS

## 2020-01-17 SURGICAL SUPPLY — 51 items
BIT DRILL 2.0X128 (BIT) ×2 IMPLANT
BIT DRILL 2.0X128MM (BIT) ×1
BLADE SAGITTAL 25.0X1.27X90 (BLADE) ×2 IMPLANT
BLADE SAGITTAL 25.0X1.27X90MM (BLADE) ×1
CLOSURE STERI-STRIP 1/2X4 (GAUZE/BANDAGES/DRESSINGS) ×1
CLSR STERI-STRIP ANTIMIC 1/2X4 (GAUZE/BANDAGES/DRESSINGS) ×3 IMPLANT
COVER SURGICAL LIGHT HANDLE (MISCELLANEOUS) ×3 IMPLANT
COVER WAND RF STERILE (DRAPES) ×3 IMPLANT
DRAPE ORTHO SPLIT 77X108 STRL (DRAPES) ×4
DRAPE SURG ORHT 6 SPLT 77X108 (DRAPES) ×2 IMPLANT
DRAPE U-SHAPE 47X51 STRL (DRAPES) ×3 IMPLANT
DRSG MEPILEX BORDER 4X8 (GAUZE/BANDAGES/DRESSINGS) ×3 IMPLANT
DURAPREP 26ML APPLICATOR (WOUND CARE) ×3 IMPLANT
ELECT BLADE TIP CTD 4 INCH (ELECTRODE) ×3 IMPLANT
ELECT CAUTERY BLADE TIP 2.5 (TIP) ×3
ELECT REM PT RETURN 15FT ADLT (MISCELLANEOUS) ×3 IMPLANT
ELECTRODE CAUTERY BLDE TIP 2.5 (TIP) ×1 IMPLANT
FACESHIELD WRAPAROUND (MASK) ×3 IMPLANT
FACESHIELD WRAPAROUND OR TEAM (MASK) ×1 IMPLANT
GLOVE BIO SURGEON STRL SZ7.5 (GLOVE) ×6 IMPLANT
GLOVE BIOGEL PI IND STRL 8 (GLOVE) ×2 IMPLANT
GLOVE BIOGEL PI INDICATOR 8 (GLOVE) ×4
GOWN STRL REUS W/ TWL LRG LVL3 (GOWN DISPOSABLE) ×2 IMPLANT
GOWN STRL REUS W/ TWL XL LVL3 (GOWN DISPOSABLE) ×1 IMPLANT
GOWN STRL REUS W/TWL LRG LVL3 (GOWN DISPOSABLE) ×4
GOWN STRL REUS W/TWL XL LVL3 (GOWN DISPOSABLE) ×2
HEAD MODULAR ENDO (Orthopedic Implant) ×2 IMPLANT
HEAD UNPLR 50XMDLR STRL HIP (Orthopedic Implant) IMPLANT
IMMOBILIZER KNEE 22 UNIV (SOFTGOODS) ×3 IMPLANT
KIT BASIN OR (CUSTOM PROCEDURE TRAY) ×3 IMPLANT
MANIFOLD NEPTUNE II (INSTRUMENTS) ×3 IMPLANT
NS IRRIG 1000ML POUR BTL (IV SOLUTION) ×3 IMPLANT
PACK TOTAL JOINT (CUSTOM PROCEDURE TRAY) ×3 IMPLANT
PAD ARMBOARD 7.5X6 YLW CONV (MISCELLANEOUS) ×6 IMPLANT
RETRIEVER SUT HEWSON (MISCELLANEOUS) ×3 IMPLANT
SLEEVE UNITRAX (Orthopedic Implant) ×2 IMPLANT
STAPLER VISISTAT 35W (STAPLE) IMPLANT
STEM ACCOLADE SZ 6 (Hips) ×2 IMPLANT
SUT FIBERWIRE #2 38 REV NDL BL (SUTURE) ×6
SUT MNCRL AB 4-0 PS2 18 (SUTURE) ×3 IMPLANT
SUT MON AB 2-0 CT1 36 (SUTURE) ×3 IMPLANT
SUT STRATAFIX 0 PDS 27 VIOLET (SUTURE)
SUT VIC AB 1 CT1 27 (SUTURE) ×2
SUT VIC AB 1 CT1 27XBRD ANBCTR (SUTURE) ×1 IMPLANT
SUT VIC AB 3-0 SH 27 (SUTURE) ×6
SUT VIC AB 3-0 SH 27X BRD (SUTURE) IMPLANT
SUTURE FIBERWR#2 38 REV NDL BL (SUTURE) ×2 IMPLANT
SUTURE STRATFX 0 PDS 27 VIOLET (SUTURE) IMPLANT
TOWEL OR 17X26 10 PK STRL BLUE (TOWEL DISPOSABLE) ×6 IMPLANT
TOWEL OR NON WOVEN STRL DISP B (DISPOSABLE) ×3 IMPLANT
TRAY FOLEY MTR SLVR 16FR STAT (SET/KITS/TRAYS/PACK) IMPLANT

## 2020-01-17 NOTE — Progress Notes (Addendum)
[place] for heparin Indication: hx pulmonary embolus (home xarelto on hold)  Allergies  Allergen Reactions  . Bee Venom Other (See Comments)    Bump where stung and took days to go away  . Glucophage [Metformin] Diarrhea    "I will never take it again - stool incontinent"  . Shellfish Allergy Swelling  . Grass Extracts [Gramineae Pollens] Other (See Comments)    Unknown rxn  . Tobacco [Tobacco] Other (See Comments)    Unknown rxn    Patient Measurements: height 65 inches, weight 61 kg (recorded on 12/25/19)   Heparin Dosing Weight: 61 kg  Vital Signs: Temp: 97.8 F (36.6 C) (07/20 0859) Temp Source: Axillary (07/20 0859) BP: 152/66 (07/20 0859) Pulse Rate: 75 (07/20 0859)  Labs: Recent Labs    01/16/20 0630 01/16/20 0949 01/16/20 1759 01/17/20 0351 01/17/20 0637  HGB 13.1  --   --  11.9*  --   HCT 38.7*  --   --  37.9*  --   PLT 239  --   --  188  --   APTT  --  28 80*  --  88*  LABPROT 16.0*  --   --   --   --   INR 1.3*  --   --   --   --   HEPARINUNFRC  --  1.68* 1.08*  --  0.79*  CREATININE 0.69  --   --  0.56*  --     CrCl cannot be calculated (Unknown ideal weight.).   Medications:  - on xarelto 20 mg daily PTA (last dose taken on 7/18 at 0830)  Assessment: Patient's an 79 y.o M with hx PE (05/14/19) on xarelto PTA, presented to the ED on 7/19 s/p fall. X-ray showed closed displaced left femoral neck fracture.  Ortho team recom. left hip hemi. when he's more stable.  He's transitioned to heparin drip on admission in anticipation of procedure.  Today, 01/17/2020: -aPTT is therapeutic at 88 seconds with heparin infusing at 900 units/hr -heparin level is supra-therapeutic at 0.79 seconds on heparin drip at 900 units/hr- reflects PTA Xarelto  No bleeding reported  Goal of Therapy:  Heparin level 0.3-0.7 units/ml aPTT 66-102 seconds Monitor platelets by anticoagulation protocol: Yes   Plan:  - continue heparin  drip at 900 units/hr until stopped for procedure    (stopped 01/17/2020 0700) - daily aPTT and Heparin level, daily CBC - F/U anticoagulation following procedure - monitor for s/s bleeding  Shela Commons, Pharm.D, BCPS 01/17/2020 9:41 AM

## 2020-01-17 NOTE — Transfer of Care (Signed)
Immediate Anesthesia Transfer of Care Note  Patient: Hayden [name]  Procedure(s) Performed: ARTHROPLASTY LATERAL POSTERIOR APPROACH HIP (HEMIARTHROPLASTY) (Left Hip)  Patient Location: PACU  Anesthesia Type:General  Level of Consciousness: oriented, drowsy and patient cooperative  Airway & Oxygen Therapy: Patient Spontanous Breathing and Patient connected to face mask oxygen  Post-op Assessment: Report given to RN and Post -op Vital signs reviewed and stable  Post vital signs: Reviewed and stable  Last Vitals:  Vitals Value Taken Time  BP 109/49 01/17/20 1215  Temp    Pulse 81 01/17/20 1217  Resp 12 01/17/20 1217  SpO2 94 % 01/17/20 1217  Vitals shown include unvalidated device data.  Last Pain:  Vitals:   01/17/20 0900  TempSrc:   PainSc: 0-No pain      Patients Stated Pain Goal: 2 (35/82/51 8984)  Complications: No complications documented.

## 2020-01-17 NOTE — Anesthesia Preprocedure Evaluation (Addendum)
Anesthesia Evaluation  Patient identified by MRN, date of birth, ID band Patient awake    Reviewed: Allergy & Precautions, NPO status , Patient's Chart, lab work & pertinent test results  Airway Mallampati: II  TM Distance: >3 FB Neck ROM: Full    Dental no notable dental hx. 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 Patient attempts to grab and or grope male patients   Neuromuscular disease    GI/Hepatic GERD  Medicated,  Endo/Other  diabetes, Type 2  Renal/GU      Musculoskeletal  (+) Fibromyalgia -  Abdominal   Peds  Hematology   Anesthesia Other Findings   Reproductive/Obstetrics                            Anesthesia Physical  Anesthesia Plan  ASA: III  Anesthesia Plan: General   Post-op Pain Management:    Induction: Intravenous  PONV Risk Score and Plan: 2 and Ondansetron and Treatment may vary due to age or medical condition  Airway Management Planned: Oral ETT and LMA  Additional Equipment:   Intra-op Plan:   Post-operative Plan: Extubation in OR  Informed Consent: I have reviewed the patients History and Physical, chart, labs and discussed the procedure including the risks, benefits and alternatives for the proposed anesthesia with the patient or authorized representative who has indicated his/her understanding and acceptance.     Dental advisory given  Plan Discussed with: CRNA, Surgeon and Anesthesiologist  Anesthesia Plan Comments: (  )       Anesthesia Quick Evaluation

## 2020-01-17 NOTE — Interval H&P Note (Signed)
History and Physical Interval Note:  01/17/2020 7:28 AM  Hayden [name]  has presented today for surgery, with the diagnosis of LEFT HIP FRACTURE.  The various methods of treatment have been discussed with the patient and family. After consideration of risks, benefits and other options for treatment, the patient has consented to  Procedure(s): ARTHROPLASTY LATERAL POSTERIOR APPROACH HIP (HEMIARTHROPLASTY) (Left) as a surgical intervention.  The patient's history has been reviewed, patient examined, no change in status, stable for surgery.  I have reviewed the patient's chart and labs.  Questions were answered to the patient's satisfaction.     Renette Butters

## 2020-01-17 NOTE — Consult Note (Signed)
WOC attempted to see patient; in OR for left hip fracture. Will request dressings to be removed when patient arrives to a medical or surgical floor. Updated orders  Will attempt to see patient when he has been transferred to regular hospital room.   [place], [place], [place]

## 2020-01-17 NOTE — Plan of Care (Signed)
  Problem: Clinical Measurements: Goal: Cardiovascular complication will be avoided 01/17/2020 0647 by Elza Rafter, RN Outcome: Progressing 01/16/2020 2343 by Elza Rafter, RN Outcome: Progressing   Problem: Clinical Measurements: Goal: Respiratory complications will improve 01/17/2020 0647 by Elza Rafter, RN Outcome: Progressing 01/16/2020 2343 by Elza Rafter, RN Outcome: Progressing   Problem: Pain Managment: Goal: General experience of comfort will improve 01/17/2020 0647 by Elza Rafter, RN Outcome: Progressing 01/16/2020 2343 by Elza Rafter, RN Outcome: Progressing   Problem: Safety: Goal: Ability to remain free from injury will improve 01/17/2020 0647 by Elza Rafter, RN Outcome: Progressing 01/16/2020 2343 by Elza Rafter, RN Outcome: Progressing

## 2020-01-17 NOTE — Progress Notes (Signed)
PROGRESS NOTE    Hayden [name]  HCW:[id number]  DOB: 03-06-1941  DOA: 01/16/2020 PCP: Patient, No Pcp Per Outpatient Specialists:   Hospital course:  Hayden [name] is a 79 y.o. male with medical history significant of vascular dementia, DM2, prior R hip fracture s/p surgical repair was admitted 01/16/2020 with left hip fracture after mechanical fall.   Subjective:  Patient states that he is okay after surgery.  Denies pain.   Objective: Vitals:   01/17/20 1315 01/17/20 1330 01/17/20 1341 01/17/20 1448  BP: 126/60 122/75 123/61 122/67  Pulse: 84 86 82 86  Resp: 15 11    Temp: 98 F (36.7 C)  97.8 F (36.6 C) 97.7 F (36.5 C)  TempSrc:   Oral Axillary  SpO2: 100% 100% 100% 100%    Intake/Output Summary (Last 24 hours) at 01/17/2020 1614 Last data filed at 01/17/2020 1449 Gross per 24 hour  Intake 1814.03 ml  Output 3325 ml  Net -1510.97 ml   There were no vitals filed for this visit.   Exam:  General: Initially asleep but arousable by voice alone, is recently out of surgery. Eyes: sclera anicteric, conjuctiva mild injection bilaterally CVS: S1-S2, regular  Respiratory:  decreased air entry bilaterally secondary to decreased inspiratory effort, rales at bases  GI: NABS, soft, NT  LE: CDI  Assessment & Plan:   Left hip fracture Status post ORIF per orthopedics earlier today Pain management and DVT prophylaxis per orthopedics PT OT can be started as warranted  DM2 Blood sugar in the mid 170s at present on present SSI regimen I have restarted his Levemir at 10 units down from his usual 24 units as his p.o. intake is still decreased  HTN Continue amlodipine  History of VTE/PE Xarelto being held This can be restarted as warranted per orthopedics  Dementia Continue risperidone, trazodone and sertraline per home doses Deviously noted that patient has a history of hypersexuality with attempts to grab and grow male patients  History of CLL WBC is  only 16    DVT prophylaxis: Lovenox to be started per orthopedics, patient had previously been on heparin drip and is recently status post surgery.  Code Status: DNR Family Communication: None Disposition Plan:   Patient is from: Assisted living  Anticipated Discharge Location: TBD  Barriers to Discharge: Acute hip fracture  Is patient medically stable for Discharge: No   Consultants:  Orthopedics  Procedures:  Status post ORIF   Antimicrobials:  None   Data Reviewed:  Basic Metabolic Panel: Recent Labs  Lab 01/16/20 0630 01/17/20 0351  NA 139 134*  K 3.4* 3.9  CL 105 105  CO2 24 22  GLUCOSE 191* 206*  BUN 16 15  CREATININE 0.69 0.56*  CALCIUM 8.9 8.6*   Liver Function Tests: No results for input(s): AST, ALT, ALKPHOS, BILITOT, PROT, ALBUMIN in the last 168 hours. No results for input(s): LIPASE, AMYLASE in the last 168 hours. No results for input(s): AMMONIA in the last 168 hours. CBC: Recent Labs  Lab 01/16/20 0630 01/17/20 0351  WBC 16.1* 12.0*  NEUTROABS 9.3*  --   HGB 13.1 11.9*  HCT 38.7* 37.9*  MCV 93.3 96.9  PLT 239 188   Cardiac Enzymes: No results for input(s): CKTOTAL, CKMB, CKMBINDEX, TROPONINI in the last 168 hours. BNP (last 3 results) No results for input(s): PROBNP in the last 8760 hours. CBG: Recent Labs  Lab 01/16/20 1155 01/16/20 1653 01/16/20 2120 01/17/20 0753 01/17/20 0857  GLUCAP 190* 189* 183*  177* 175*    Recent Results (from the past 240 hour(s))  SARS Coronavirus 2 by RT PCR (hospital order, performed in [facility] hospital lab) Nasopharyngeal Nasopharyngeal Swab     Status: None   Collection Time: 01/16/20  6:39 AM   Specimen: Nasopharyngeal Swab  Result Value Ref Range Status   SARS Coronavirus 2 NEGATIVE NEGATIVE Final    Comment: (NOTE) SARS-CoV-2 target nucleic acids are NOT DETECTED.  The SARS-CoV-2 RNA is generally detectable in upper and lower respiratory specimens during the acute phase of  infection. The lowest concentration of SARS-CoV-2 viral copies this assay can detect is 250 copies / mL. A negative result does not preclude SARS-CoV-2 infection and should not be used as the sole basis for treatment or other patient management decisions.  A negative result may occur with improper specimen collection / handling, submission of specimen other than nasopharyngeal swab, presence of viral mutation(s) within the areas targeted by this assay, and inadequate number of viral copies (<250 copies / mL). A negative result must be combined with clinical observations, patient history, and epidemiological information.  Fact Sheet for Patients:   StrictlyIdeas.no  Fact Sheet for Healthcare Providers: BankingDealers.co.za  This test is not yet approved or  cleared by the [place] FDA and has been authorized for detection and/or diagnosis of SARS-CoV-2 by FDA under an Emergency Use Authorization (EUA).  This EUA will remain in effect (meaning this test can be used) for the duration of the COVID-19 declaration under Section 564(b)(1) of the Act, 21 U.S.C. section 360bbb-3(b)(1), unless the authorization is terminated or revoked sooner.  Performed at [facility], [place] [address] [place], [place] [address]   MRSA PCR Screening     Status: None   Collection Time: 01/16/20 11:25 PM   Specimen: Nasal Mucosa; Nasopharyngeal  Result Value Ref Range Status   MRSA by PCR NEGATIVE NEGATIVE Final    Comment:        The GeneXpert MRSA Assay (FDA approved for NASAL specimens only), is one component of a comprehensive MRSA colonization surveillance program. It is not intended to diagnose MRSA infection nor to guide or monitor treatment for MRSA infections. Performed at [facility], [place] [address], [place], [place] [address]       Studies: DG Chest 1 View  Result Date: 01/16/2020 CLINICAL  DATA:  Unwitnessed fall. EXAM: CHEST  1 VIEW COMPARISON:  12/25/2019 FINDINGS: The cardiac silhouette, mediastinal and hilar contours are within normal limits and stable. Mild tortuosity and calcification of the thoracic aorta. Low lung volumes with vascular crowding and streaky atelectasis. No definite infiltrates, effusions or pneumothorax. The bony thorax is intact. IMPRESSION: Low lung volumes with vascular crowding and streaky atelectasis. Electronically Signed   By: Marijo Sanes M.D.   On: 01/16/2020 05:46   DG Ankle Complete Left  Result Date: 01/16/2020 CLINICAL DATA:  [place]. Left ankle pain. EXAM: LEFT ANKLE COMPLETE - 3+ VIEW COMPARISON:  None. FINDINGS: The ankle mortise is maintained. No acute ankle fracture is identified. The mid and hindfoot bony structures are intact. Extensive vascular calcifications. IMPRESSION: No acute ankle fracture. Electronically Signed   By: Marijo Sanes M.D.   On: 01/16/2020 05:51   DG Knee Complete 4 Views Left  Result Date: 01/16/2020 CLINICAL DATA:  [place]. Left knee pain. EXAM: LEFT KNEE - COMPLETE 4+ VIEW COMPARISON:  None. FINDINGS: The joint spaces are maintained. Minimal degenerative changes for age. No acute fracture is identified. No definite joint  effusion. Vascular calcifications are noted. IMPRESSION: No acute bony findings or joint effusion. Electronically Signed   By: Marijo Sanes M.D.   On: 01/16/2020 05:50   DG Foot Complete Right  Result Date: 01/16/2020 CLINICAL DATA:  Right femoral neck fracture, right heel pain EXAM: RIGHT FOOT COMPLETE - 3+ VIEW COMPARISON:  None. FINDINGS: Normal alignment. No fracture or dislocation. Joint spaces are preserved. Advanced vascular calcifications are seen within the soft tissues. IMPRESSION: No acute fracture or dislocation. Electronically Signed   By: Fidela Salisbury MD   On: 01/16/2020 16:41   DG Hip Port Unilat With Pelvis 1V Left  Result Date: 01/17/2020 CLINICAL DATA:  Postoperative left hip. EXAM: DG  HIP (WITH OR WITHOUT PELVIS) 1V PORT LEFT COMPARISON:  January 16, 2020 FINDINGS: Left hip hemiarthroplasty with normal alignment of the orthopedic hardware. The fractured left femoral neck has been resected. Expected soft tissue emphysema. Stable appearance of right IM nail fixation of subtrochanteric right femoral fracture. IMPRESSION: Left hip hemiarthroplasty with normal alignment of the orthopedic hardware. Electronically Signed   By: Fidela Salisbury M.D.   On: 01/17/2020 13:08   DG Hip Unilat W or Wo Pelvis 2-3 Views Left  Result Date: 01/16/2020 CLINICAL DATA:  Unwitnessed fall. Left hip pain. EXAM: DG HIP (WITH OR WITHOUT PELVIS) 2-3V LEFT COMPARISON:  11/09/2013 FINDINGS: There is a displaced fracture of the left femoral neck with a varus deformity. Right hip hardware is noted. No complicating features. The pubic symphysis and SI joints are intact. No pelvic fractures or bone lesions. Stable vascular calcifications. IMPRESSION: Displaced left femoral neck fracture. Electronically Signed   By: Marijo Sanes M.D.   On: 01/16/2020 05:49     Scheduled Meds: . amLODipine  10 mg Oral Daily  . bupivacaine liposome  20 mL Infiltration Once  . donepezil  10 mg Oral QHS  . furosemide  20 mg Oral Daily  . insulin aspart  0-15 Units Subcutaneous TID WC  . insulin aspart  0-5 Units Subcutaneous QHS  . pantoprazole  40 mg Oral Daily  . tamsulosin  0.4 mg Oral Daily   Continuous Infusions: . sodium chloride 75 mL/hr at 01/17/20 0154    Principal Problem:   Left displaced femoral neck fracture (HCC) Active Problems:   CLL (chronic lymphocytic leukemia) (HCC)   Type 2 diabetes mellitus without complication, with long-term current use of insulin (HCC)   Mild dementia ([place])   Dementia without behavioral disturbance ([place])   DNR (do not resuscitate)     Vashti Hey, [facility]  If 7PM-7AM, please contact night-coverage [email or web address] Password [email or web address] 01/17/2020, 4:14 PM      LOS: 1 day

## 2020-01-17 NOTE — Progress Notes (Signed)
ANTICOAGULATION CONSULT NOTE - Initial Consult  Pharmacy Consult for Xarelto Indication: hx PE  Allergies  Allergen Reactions   Bee Venom Other (See Comments)    Bump where stung and took days to go away   Glucophage [Metformin] Diarrhea    "I will never take it again - stool incontinent"   Shellfish Allergy Swelling   Grass Extracts [Gramineae Pollens] Other (See Comments)    Unknown rxn   Tobacco [Tobacco] Other (See Comments)    Unknown rxn    Patient Measurements: Height 65 inches, weight 61 kg (recorded on 12/25/19)  Vital Signs: Temp: 97.8 F (36.6 C) (07/20 0859) Temp Source: Axillary (07/20 0859) BP: 152/66 (07/20 0859) Pulse Rate: 75 (07/20 0859)  Labs: Recent Labs    01/16/20 0630 01/16/20 0949 01/16/20 1759 01/17/20 0351 01/17/20 0637  HGB 13.1  --   --  11.9*  --   HCT 38.7*  --   --  37.9*  --   PLT 239  --   --  188  --   APTT  --  28 80*  --  88*  LABPROT 16.0*  --   --   --   --   INR 1.3*  --   --   --   --   HEPARINUNFRC  --  1.68* 1.08*  --  0.79*  CREATININE 0.69  --   --  0.56*  --     CrCl cannot be calculated (Unknown ideal weight.).   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   unknown   acetaminophen (TYLENOL) 500 MG tablet Take 500 mg by mouth every 4 (four) hours as needed for mild pain, fever or headache.   01/02/2020 at 130pm   amLODipine (NORVASC)  10 MG tablet Take 1 tablet (10 mg total) by mouth daily.   01/15/2020 at 830am   celecoxib (CELEBREX) 200 MG capsule Take 200 mg by mouth 2 (two) times daily.   01/15/2020 at 630pm   Cholecalciferol 50 MCG (2000 UT) CAPS Take 2,000 Units by mouth daily.    01/15/2020 at 830am   Docusate Sodium 100 MG capsule Take 100 mg by mouth daily.   01/15/2020 at 830am   donepezil (ARICEPT) 10 MG tablet Take 1 tablet (10 mg total) by mouth at bedtime. 30 tablet 11 01/15/2020 at 630pm   furosemide (LASIX) 20 MG tablet Take 1 tablet (20 mg total) by mouth daily. 3 tablet 0 01/15/2020 at 830am   guaiFENesin (ROBITUSSIN) 100 MG/5ML liquid Take 200 mg by mouth every 6 (six) hours as needed for cough.   01/02/2020 at 130pm   haloperidol (HALDOL) 5 MG tablet Take 5 mg by mouth every 8 (eight) hours as needed for agitation.   unknown   Infant Care Products (DERMACLOUD) CREA Apply 1 application topically in the morning and at bedtime. Buttocks   01/15/2020  at 8pm   insulin lispro (HUMALOG) 100 UNIT/ML KwikPen Inject 6 Units into the skin 3 (three) times daily with meals.   01/15/2020 at [place] 100 UNIT/ML Pen Inject 24 Units into the skin at bedtime. 15 mL 0 01/15/2020 at 8pm   loperamide (IMODIUM) 2 MG capsule Take 2 mg by mouth See admin instructions. Take 2 mg by mouth with each loose stool as needed for diarrhea (do not exceed 8 doses in 24 hours)   11/05/2019 at 6pm   lovastatin (MEVACOR) 20 MG tablet Take 20 mg by mouth daily.   01/15/2020 at 630pm   magnesium hydroxide (MILK OF MAGNESIA) 400 MG/5ML suspension Take 30 mLs by mouth at bedtime as needed for mild constipation.   unknown   neomycin-bacitracin-polymyxin (NEOSPORIN) [phone] ointment Apply 1 application topically See admin instructions. *Standing order for skin tear, abrasions* Clean area with normal saline. Apply tao, cover with bandaid or gauze and tape. Change as needed until healed.   unknown   omeprazole (PRILOSEC) 20 MG capsule  Take 1 capsule (20 mg total) by mouth daily. 30 capsule 0 01/15/2020 at 630am   risperiDONE (RISPERDAL) 1 MG tablet Take 1 mg by mouth in the morning and at bedtime.   01/15/2020 at 630pm   rivaroxaban (XARELTO) 20 MG TABS tablet Take 1 tablet (20 mg total) by mouth daily with supper. Patient will be on Xarelto loading dose until 06/07/2019, then he will be transitioned to this dose Xarelto 20 mg p.o. once daily on 06/08/2019 (Patient taking differently: Take 20 mg by mouth daily. ) 30 tablet  01/15/2020 at 830am   sertraline (ZOLOFT) 25 MG tablet Take 25 mg by mouth daily.    01/15/2020 at 830am   Skin Protectants, Misc. (BAZA PROTECT EX) Apply 1 application topically as needed (incontinence episode). Buttocks and scrotum   unknown   tamsulosin (FLOMAX) 0.4 MG CAPS capsule Take 0.4 mg by mouth daily.    01/15/2020 at 830am   traZODone (DESYREL) 50 MG tablet Take 50 mg by mouth every 8 (eight) hours as needed (agitation/depression).    unknown    Assessment: Pharmacy consulted for post op fracture care anticoagulation and to  restart PTA direct oral anticoagulant (DOAC) on POD1 pending AM hemoglobin being stable and not requiring blood transfusion.  Resume DOAC AM of POD1 if hemoglobin is >/= 9.  On xarelto 20 mg daily PTA (last dose taken on 7/18 at 0830) which was held prior to procedure and heparin initiated at 900 units/hr Heparin stopped this am at 0700 for procedure        Goal of Therapy:  Prevent thromboemolism recurrence Monitor platelets by anticoagulation protocol: Yes   Plan:  Resume Xarelto 20 mg daily on 01/18/2020 if hemoglobin is greater than or equal to 9 Monitor CBC, s/s bleeding   Efraim Kaufmann, PharmD, BCPS 01/17/2020,10:29 AM

## 2020-01-17 NOTE — Anesthesia Postprocedure Evaluation (Signed)
Anesthesia Post Note  Patient: Hayden [name]  Procedure(s) Performed: ARTHROPLASTY LATERAL POSTERIOR APPROACH HIP (HEMIARTHROPLASTY) (Left Hip)     Patient location during evaluation: PACU Anesthesia Type: General Level of consciousness: awake and alert Pain management: pain level controlled Vital Signs Assessment: post-procedure vital signs reviewed and stable Respiratory status: spontaneous breathing, nonlabored ventilation, respiratory function stable and patient connected to nasal cannula oxygen Cardiovascular status: blood pressure returned to baseline and stable Postop Assessment: no apparent nausea or vomiting Anesthetic complications: no   No complications documented.  Last Vitals:  Vitals:   01/17/20 1341 01/17/20 1448  BP: 123/61 122/67  Pulse: 82 86  Resp:    Temp: 36.6 C 36.5 C  SpO2: 100% 100%    Last Pain:  Vitals:   01/17/20 1448  TempSrc: Axillary  PainSc:                  Damyn Weitzel

## 2020-01-17 NOTE — Progress Notes (Signed)
Short stay notified that pt heparin gtt turned off at 0700.

## 2020-01-17 NOTE — Op Note (Signed)
01/16/2020 - 01/17/2020  11:21 AM  PATIENT:  Hayden [name]   MRN: [id number]  PRE-OPERATIVE DIAGNOSIS:  LEFT HIP FRACTURE  POST-OPERATIVE DIAGNOSIS:  LEFT HIP FRACTURE  PROCEDURE:  Procedure(s): ARTHROPLASTY LATERAL POSTERIOR APPROACH HIP (HEMIARTHROPLASTY)  PREOPERATIVE INDICATIONS:  Hayden [name] is an 79 y.o. male who was admitted 01/16/2020 with a diagnosis of Left displaced femoral neck fracture ([place]) and elected for surgical management.  The risks benefits and alternatives were discussed with the patient including but not limited to the risks of nonoperative treatment, versus surgical intervention including infection, bleeding, nerve injury, periprosthetic fracture, the need for revision surgery, dislocation, leg length discrepancy, blood clots, cardiopulmonary complications, morbidity, mortality, among others, and they were willing to proceed.  Predicted outcome is good, although there will be at least a six to nine month expected recovery.   OPERATIVE REPORT     SURGEON:  Edmonia Lynch, MD    ASSISTANT:  Noemi Chapel, PA-C, he was present and scrubbed throughout the case, critical for completion in a timely fashion, and for retraction, instrumentation, and closure.     ANESTHESIA:  General    COMPLICATIONS:  None.      COMPONENTS:  [employer] A[employer]: Femoral stem: 6, Femoral Head:50, Neck:-4   PROCEDURE IN DETAIL: The patient was met in the holding area and identified.  The appropriate hip  was marked at the operative site. The patient was then transported to the OR and  placed under general anesthesia.  At that point, the patient was  placed in the lateral decubitus position with the operative side up and  secured to the operating room table and all bony prominences padded.     The operative lower extremity was prepped from the iliac crest to the toes.  Sterile draping was performed.  Time out was performed prior to incision.      A routine posterolateral approach was  utilized via sharp dissection  carried down to the subcutaneous tissue.  Gross bleeders were Bovie  coagulated.  The iliotibial band was identified and incised  along the length of the skin incision.  Self-retaining retractors were  inserted. I examined the bursa there was significant hematoma and edema I performed a bursectomy here.  With the hip internally rotated, the short external rotators  were identified. The piriformis was tagged with FiberWire, and the hip capsule released in a T-type fashion.  The femoral neck was exposed, and I resected the femoral neck using the appropriate jig. This was performed at approximately a thumb's breadth above the lesser trochanter.    I then exposed the deep acetabulum, cleared out any tissue including the ligamentum teres, and included the hip capsule in the FiberWire used above and below the T.    I then prepared the proximal femur using the cookie-cutter, the lateralizing reamer, and then sequentially broached.  A trial utilized, and I reduced the hip and it was found to have excellent stability with functional range of motion. The trial components were then removed.   The canal and acetabulum were thoroughly irrigated  I inserted the pressfit stem and placed the head and neck collar. The hip was reduced with appropriate force and was stable through a range of motion.   I then used a 2 mm drill bits to pass the FiberWire suture from the capsule and puriform is through the greater trochanter, and secured this. Excellent posterior capsular repair was achieved. I also closed the T in the capsule.  I then irrigated the hip  copiously again with pulse lavage, and repaired the fascia with Vicryl, followed by Vicryl for the subcutaneous tissue, Monocryl for the skin, Steri-Strips and sterile gauze. The wounds were injected. The patient was then awakened and returned to PACU in stable and satisfactory condition. There were no complications.  POST-OP PLAN: Weight  bearing as tolerated. DVT px will consist of SCD's and chemical px  Edmonia Lynch, MD Orthopedic Surgeon [phone]   01/17/2020 11:21 AM

## 2020-01-17 NOTE — Anesthesia Procedure Notes (Signed)
Procedure Name: Intubation Date/Time: 01/17/2020 10:22 AM Performed by: Garrel Ridgel, CRNA Pre-anesthesia Checklist: Patient identified, Emergency Drugs available, Suction available and Patient being monitored Patient Re-evaluated:Patient Re-evaluated prior to induction Oxygen Delivery Method: Circle system utilized Preoxygenation: Pre-oxygenation with 100% oxygen Induction Type: IV induction Ventilation: Mask ventilation without difficulty Laryngoscope Size: Glidescope and 4 Grade View: Grade I Tube type: Oral Tube size: 7.5 mm Number of attempts: 1 Airway Equipment and Method: Oral airway,  Video-laryngoscopy and Stylet Placement Confirmation: ETT inserted through vocal cords under direct vision,  positive ETCO2 and breath sounds checked- equal and bilateral Secured at: 22 cm Tube secured with: Tape Dental Injury: Teeth and Oropharynx as per pre-operative assessment

## 2020-01-18 ENCOUNTER — Encounter (HOSPITAL_COMMUNITY): Payer: Self-pay | Admitting: Orthopedic Surgery

## 2020-01-18 DIAGNOSIS — F0390 Unspecified dementia without behavioral disturbance: Secondary | ICD-10-CM

## 2020-01-18 LAB — CBC
HCT: 33.2 % — ABNORMAL LOW (ref 39.0–52.0)
Hemoglobin: 11.2 g/dL — ABNORMAL LOW (ref 13.0–17.0)
MCH: 30.8 pg (ref 26.0–34.0)
MCHC: 33.7 g/dL (ref 30.0–36.0)
MCV: 91.2 fL (ref 80.0–100.0)
Platelets: 195 10*3/uL (ref 150–400)
RBC: 3.64 MIL/uL — ABNORMAL LOW (ref 4.22–5.81)
RDW: 12.5 % (ref 11.5–15.5)
WBC: 12.1 10*3/uL — ABNORMAL HIGH (ref 4.0–10.5)
nRBC: 0 % (ref 0.0–0.2)

## 2020-01-18 LAB — BASIC METABOLIC PANEL
Anion gap: 9 (ref 5–15)
BUN: 13 mg/dL (ref 8–23)
CO2: 24 mmol/L (ref 22–32)
Calcium: 8.9 mg/dL (ref 8.9–10.3)
Chloride: 103 mmol/L (ref 98–111)
Creatinine, Ser: 0.6 mg/dL — ABNORMAL LOW (ref 0.61–1.24)
GFR calc Af Amer: >60 mL/min (ref >60)
GFR calc non Af Amer: >60 mL/min (ref >60)
Glucose, Bld: 217 mg/dL — ABNORMAL HIGH (ref 70–99)
Potassium: 4.1 mmol/L (ref 3.5–5.1)
Sodium: 136 mmol/L (ref 135–145)

## 2020-01-18 LAB — GLUCOSE, CAPILLARY
Glucose-Capillary: 199 mg/dL — ABNORMAL HIGH (ref 70–99)
Glucose-Capillary: 245 mg/dL — ABNORMAL HIGH (ref 70–99)
Glucose-Capillary: 169 mg/dL — ABNORMAL HIGH (ref 70–99)
Glucose-Capillary: 203 mg/dL — ABNORMAL HIGH (ref 70–99)

## 2020-01-18 MED ORDER — OXYCODONE HCL 5 MG PO TABS: ORAL_TABLET | ORAL | 0 refills | Status: DC

## 2020-01-18 MED ORDER — RIVAROXABAN 10 MG PO TABS
20.0000 mg | ORAL_TABLET | Freq: Every day | ORAL | Status: DC
  Filled 2020-01-18: qty 2

## 2020-01-18 MED ORDER — HYDROCODONE-ACETAMINOPHEN 5-325 MG PO TABS: 1.0000 | ORAL_TABLET | Freq: Four times a day (QID) | ORAL | 0 refills | Status: DC | PRN

## 2020-01-18 MED ORDER — RIVAROXABAN 10 MG PO TABS
20.0000 mg | ORAL_TABLET | Freq: Every day | ORAL | Status: DC
  Administered 2020-01-18 – 2020-01-20 (×3): 20 mg via ORAL
  Filled 2020-01-18 (×2): qty 2

## 2020-01-18 NOTE — TOC Initial Note (Signed)
Transition of Care [facility]) - Initial/Assessment Note    Patient Details  Name: Hayden [name] MRN: [id number] Date of Birth: December 16, 1940  Transition of Care [facility]) CM/SW Contact:    Lennart Pall, LCSW Phone Number: 01/18/2020, 2:39 PM  Clinical Narrative:  Met with pt and have spoken with [facility], Roselyn Reef.  Pt able to answer basic answers, however, not completely accurate.  Pt reporting that his wife and 2 of his 6 children are living at [facility] but only his wife is there.  Pt and wife are in separate living areas but both in memory care.  Pt has been a resident there for ~ 5 years.  He is aware that he suffered a hip fx in his fall and needs therapy.  Manager notes that he has "not really walked much anymore... mostly just scoots around in his wheelchair..."  Staff assist with daily ADLs. We discussed possibility that pt will now require increased support but will await report from therapies. Teacher, adult education notes he may need to do short term SNF for rehab but will await tx recommendations. TOC to continue to follow for d/c plans.                 Expected Discharge Plan: Memory Care (vs. possible short term SNF) Barriers to Discharge: Continued Medical Work up   Patient Goals and CMS Choice Patient states their goals for this hospitalization and ongoing recovery are:: go back to [facility]      Expected Discharge Plan and Services Expected Discharge Plan: Memory Care (vs. possible short term SNF) In-house Referral: Clinical Social Work     Living arrangements for the past 2 months: Emergency planning/management officer (ALF/ Memory Care at [facility])                                      Prior Living Arrangements/Services Living arrangements for the past 2 months: [place] (ALF/ Memory Care at [facility]) Lives with:: Self Patient language and need for interpreter reviewed:: Yes Do you feel safe going back to the place where you live?: Yes       Need for Family Participation in Patient Care: No (Comment) Care giver support system in place?: Yes (comment) (per ALF) Current home services: Homehealth aide Criminal Activity/Legal Involvement Pertinent to Current Situation/Hospitalization: No - Comment as needed  Activities of Daily Living Home Assistive Devices/Equipment: Blood pressure cuff, CBG Meter, Eyeglasses, Grab bars around toilet, Grab bars in shower, Hand-held shower hose, Hospital bed, Scales, Wheelchair ADL Screening (condition at time of admission) Patient's cognitive ability adequate to safely complete daily activities?: No Is the patient deaf or have difficulty hearing?: Yes Does the patient have difficulty seeing, even when wearing glasses/contacts?: No Does the patient have difficulty concentrating, remembering, or making decisions?: Yes Patient able to express need for assistance with ADLs?: Yes Does the patient have difficulty dressing or bathing?: Yes Independently performs ADLs?: No Communication: Appropriate for developmental age (slow to respond) Dressing (OT): Needs assistance Is this a change from baseline?: Pre-admission baseline Grooming: Needs assistance Is this a change from baseline?: Pre-admission baseline Feeding: Needs assistance Is this a change from baseline?: Pre-admission baseline Bathing: Needs assistance Is this a change from baseline?: Pre-admission baseline Toileting: Needs assistance Is this a change from baseline?: Pre-admission baseline In/Out Bed: Needs assistance Is this a change from baseline?: Pre-admission baseline Walks in Home: Dependent (  patient is wheelchair bound) Is this a change from baseline?: Pre-admission baseline Does the patient have difficulty walking or climbing stairs?: Yes (secondary to being wheelchair bound) Weakness of Legs: Both Weakness of Arms/Hands: None  Permission Sought/Granted Permission sought to share information with : Facility Event organiser granted to share information with : Yes, Verbal Permission Granted  Share Information with NAME: Jenny Reichmann  Permission granted to share info w AGENCY: [place] granted to share info w Relationship: Hotel manager  Permission granted to share info w Contact Information: [phone]  Emotional Assessment Appearance:: Appears stated age Attitude/Demeanor/Rapport: Engaged Affect (typically observed): Accepting, Blunt Orientation: : Oriented to Self, Oriented to Situation, Oriented to Place Alcohol / Substance Use: Not Applicable Psych Involvement: No (comment)  Admission diagnosis:  SOB (shortness of breath) [R06.02] Pre-op chest exam [Z01.811] Closed right hip fracture (HCC) [S72.001A] Closed displaced fracture of left femoral neck (HCC) [S72.002A] Unwitnessed fall [R29.6] Patient Active Problem List   Diagnosis Date Noted  . Closed right hip fracture ([place]) 01/16/2020  . Left displaced femoral neck fracture ([place]) 01/16/2020  . DNR (do not resuscitate) 01/16/2020  . Right lower lobe pneumonia 12/04/2019  . Hospice care patient 05/19/2019  . Acute pulmonary embolus ([place]) 05/14/2019  . Acute pulmonary embolism ([place]) 05/14/2019  . Pneumonia due to COVID-19 virus   . Aspiration pneumonitis ([place]) 05/13/2019  . Closed fracture of right hip ([place])   . Femur fracture, right ([place]) 02/10/2019  . Fall   . Acute encephalopathy 07/02/2018  . Dementia without behavioral disturbance ([place]) 05/24/2018  . Pressure injury of skin 05/23/2018  . Cellulitis 04/17/2018  . Cellulitis, scrotum 03/09/2018  . Cellulitis of male genitalia 03/08/2018  . Depression 03/08/2018  . BPH (benign prostatic hyperplasia) 03/08/2018  . Leukocytosis 03/08/2018  . Mild dementia ([place]) 10/23/2017  . Lower urinary tract infectious disease 09/06/2017  . Hyperglycemia 09/06/2017  . ARF (acute renal failure) ([place]) 09/06/2017  . Altered mental status 09/06/2017  . Type 2  diabetes mellitus without complication, with long-term current use of insulin ([place]) 04/02/2017  . 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 Bilateral hand pain 12/09/2016  . 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

## 2020-01-18 NOTE — Progress Notes (Signed)
Inpatient Diabetes Program Recommendations  AACE/ADA: New Consensus Statement on Inpatient Glycemic Control (2015)  Target Ranges:  Prepandial:   less than 140 mg/dL      Peak postprandial:   less than 180 mg/dL (1-2 hours)      Critically ill patients:  140 - 180 mg/dL   Lab Results  Component Value Date   GLUCAP 199 (H) 01/18/2020   HGBA1C 6.7 (H) 01/16/2020    Review of Glycemic Control  Diabetes history: DM2 Outpatient Diabetes medications: Levemir  24 units QHS, Humalog 6 units tid Current orders for Inpatient glycemic control:  Levemir 10 units QHS, Novolog 0-15 units tidwc and hs  HgbA1C - 6.7%. Good glycemic control. Eating 100%.  Inpatient Diabetes Program Recommendations:     Increase Levemir to 15 units QHS.  Add Novolog 3 units tidwc for meal coverage insulin if pt eats > 50% meal.  Continue to follow.  Thank you. Lorenda Peck, RD, LDN, CDE Inpatient Diabetes Coordinator [phone]

## 2020-01-18 NOTE — Consult Note (Signed)
WOC contacted bedside nurse regarding consult for LE wounds. Patient does not have LE wounds. Had gauze and coban wraps at the time of the admission from the SNF most likely for edema management.  Bedside nurse report heels were reddened, initiated skin care protocol appropriately and placed silicone foam dressings. I will add Prevalon boots today to offload since this patient is high risk for heel PI due to immobility and recent surgery.   Discussed POC with patient and bedside nurse.  Re consult if needed, will not follow at this time. Thanks  Ted Goodner [employer], RN,CWOCN, CNS, [place] [phone])

## 2020-01-18 NOTE — Evaluation (Signed)
Occupational Therapy Evaluation Patient Details Name: Hayden [name] MRN: [id number] DOB: 16-Aug-1940 Today's Date: 01/18/2020    History of Present Illness STEN DEMATTEO is a 79 y.o. male with medical history significant of vascular dementia, DM2, prior R hip fracture s/p surgical repair was admitted 01/16/2020 with left hip fracture after mechanical fall.   Clinical Impression   Mr. Leven Hoel is an 79 year old man s/p hip fracture repair who presents with decreased functional ROM and strength of LLE, posterior hip precautions, impaired balance, pain, stiffness of joints, significant tremor in RUE and dementia resulting in decreased ability to perform transfers, standing and participation in ADLs. Patient will benefit from skilled OT services to improve deficits, learn compensatory strategies and reiterate hip precautions in order to improve functional abilities and reduce caregiver burden.     Follow Up Recommendations  SNF    Equipment Recommendations       Recommendations for Other Services       Precautions / Restrictions Precautions Precautions: Posterior Hip Precaution Booklet Issued: No Restrictions Weight Bearing Restrictions: No      Mobility Bed Mobility Overal bed mobility: Needs Assistance Bed Mobility: Supine to Sit;Sit to Supine     Supine to sit: Mod assist;+2 for physical assistance;+2 for safety/equipment Sit to supine: Max assist;+2 for physical assistance;+2 for safety/equipment      Transfers Overall transfer level: Needs assistance Equipment used: Rolling walker (2 wheeled) Transfers: Sit to/from Stand Sit to Stand: Max assist;+2 physical assistance;+2 safety/equipment;From elevated surface         General transfer comment: Attempted stand with walker from elevated bed height with +2 assistance. Patient unable to keep lower extremities underneath him (feet extending) and therapist unable to get patinet's weight over his feet. Patient returned  to seated position. Attempted use of stedy for sit to stand. patient unable to assist with pulling forward and getting weight over feet - and feet continued to extend. Sit to stand aborted and patient returned to supine.    Balance Overall balance assessment: Needs assistance Sitting-balance support: No upper extremity supported;Feet unsupported Sitting balance-Leahy Scale: Fair Sitting balance - Comments: Initially patient exhibiting posterior lean. Patient's feet unable to stay flat on the floor. Once positioned though patinet able to sit edge of bed with min guard.                                   ADL either performed or assessed with clinical judgement   ADL Overall ADL's : Needs assistance/impaired Eating/Feeding: Maximal assistance;Set up;Bed level   Grooming: Wash/dry face;Set up;Wash/dry hands;Sitting   Upper Body Bathing: Maximal assistance;Bed level;Set up   Lower Body Bathing: Bed level;Total assistance   Upper Body Dressing : Total assistance;Bed level   Lower Body Dressing: Total assistance;+2 for physical assistance;Bed level     Toilet Transfer Details (indicate cue type and reason): n/a Toileting- Clothing Manipulation and Hygiene: +2 for physical assistance;Total assistance;Bed level     Tub/Shower Transfer Details (indicate cue type and reason): n/a Functional mobility during ADLs: Maximal assistance;+2 for physical assistance;+2 for safety/equipment       Vision   Vision Assessment?: No apparent visual deficits     Perception     Praxis      Pertinent Vitals/Pain Pain Assessment: Faces Faces Pain Scale: Hurts little more Pain Location: L Hip Pain Intervention(s): Monitored during session;Limited activity within patient's tolerance     Hand  Dominance Right   Extremity/Trunk Assessment Upper Extremity Assessment Upper Extremity Assessment: RUE deficits/detail;LUE deficits/detail RUE Deficits / Details: Grossly functional ROM, 5/5  strength shoulder, elbow ,wrist, 4/5 grip RUE Coordination: decreased fine motor;decreased gross motor (significant tremor in RUE) LUE Deficits / Details: Grossly functional ROM, 5/5 strength shoulder, elbow ,wrist, 4/5 grip   Lower Extremity Assessment Lower Extremity Assessment: Defer to PT evaluation   Cervical / Trunk Assessment Cervical / Trunk Assessment: Normal   Communication Communication Communication: Expressive difficulties   Cognition Arousal/Alertness: Awake/alert Behavior During Therapy: WFL for tasks assessed/performed Overall Cognitive Status: History of cognitive impairments - at baseline                                 General Comments: vascular dementia   General Comments       Exercises     Shoulder Instructions      Home Living Family/patient expects to be discharged to:: Skilled nursing facility                                 Additional Comments: memory care unit      Prior Functioning/Environment Level of Independence: Needs assistance  Gait / Transfers Assistance Needed: Predominantly wc bound per chart. Patient reports just transfering to wc at baseline. ADL's / Homemaking Assistance Needed: Needs assistance for ADLs. Communication / Swallowing Assistance Needed: Limited verbalizations. Comments: Minimal verbalizations. Slow to respond.        OT Problem List: Decreased range of motion;Decreased activity tolerance;Impaired balance (sitting and/or standing);Decreased coordination;Decreased cognition;Decreased safety awareness;Decreased knowledge of use of DME or AE;Decreased knowledge of precautions;Impaired UE functional use;Pain      OT Treatment/Interventions: Self-care/ADL training;DME and/or AE instruction;Therapeutic activities;Balance training;Therapeutic exercise;Patient/family education;Manual therapy    OT Goals(Current goals can be found in the care plan section) Acute Rehab OT Goals OT Goal  Formulation: Patient unable to participate in goal setting Time For Goal Achievement: 02/01/20 Potential to Achieve Goals: Fair  OT Frequency: Min 2X/week   Barriers to D/C:            Co-evaluation PT/OT/SLP Co-Evaluation/Treatment: Yes Reason for Co-Treatment: For patient/therapist safety;To address functional/ADL transfers PT goals addressed during session: Mobility/safety with mobility OT goals addressed during session: ADL's and self-care      AM-PAC OT "6 Clicks" Daily Activity     Outcome Measure Help from another person eating meals?: Total Help from another person taking care of personal grooming?: A Little Help from another person toileting, which includes using toliet, bedpan, or urinal?: Total Help from another person bathing (including washing, rinsing, drying)?: A Lot Help from another person to put on and taking off regular upper body clothing?: A Lot Help from another person to put on and taking off regular lower body clothing?: Total 6 Click Score: 10   End of Session Equipment Utilized During Treatment: Gait belt;Rolling walker Nurse Communication: Mobility status;Need for lift equipment  Activity Tolerance: Patient tolerated treatment well Patient left: in bed;with call bell/phone within reach;with bed alarm set;with nursing/sitter in room  OT Visit Diagnosis: Unsteadiness on feet (R26.81);Other abnormalities of gait and mobility (R26.89);Feeding difficulties (R63.3)                Time: 1324-4010 OT Time Calculation (min): 23 min Charges:  OT General Charges $OT Visit: 1 Visit OT Evaluation $OT Eval Moderate Complexity: 1 Mod  Derl Barrow, OTR/L [place]  Office [phone] Pager: [place] 01/18/2020, 4:07 PM

## 2020-01-18 NOTE — Evaluation (Signed)
Physical Therapy Evaluation Patient Details Name: Hayden [name] MRN: [id number] DOB: 03/29/1941 Today's Date: 01/18/2020   History of Present Illness  Hayden [name] is a 79 y.o. male with medical history significant of vascular dementia, DM2, prior R hip fracture s/p surgical repair was admitted 01/16/2020 with left hip fracture after mechanical fall.  Clinical Impression  Pt admitted as above and presenting with functional mobility limitations 2* generalized weakness, balance deficits, dementia related cognitive deficits and posterior THP.  Pt would benefit from follow up rehab at SNF level to maximize IND and safety.    Follow Up Recommendations SNF    Equipment Recommendations  None recommended by PT    Recommendations for Other Services       Precautions / Restrictions Precautions Precautions: Posterior Hip Precaution Booklet Issued: Yes (comment) Precaution Comments: THP reviewed with pt  Required Braces or Orthoses: Knee Immobilizer - Left Knee Immobilizer - Left: Other (comment) (To promote adherence to THP) Restrictions Weight Bearing Restrictions: No Other Position/Activity Restrictions: WBAT      Mobility  Bed Mobility Overal bed mobility: Needs Assistance Bed Mobility: Supine to Sit;Sit to Supine     Supine to sit: Mod assist;+2 for physical assistance;+2 for safety/equipment Sit to supine: Max assist;+2 for physical assistance;+2 for safety/equipment   General bed mobility comments: Assist to control trunk, to manage LEs and to insure adherence to THP  Transfers Overall transfer level: Needs assistance Equipment used: Rolling walker (2 wheeled) Transfers: Sit to/from Stand Sit to Stand: Max assist;+2 physical assistance;+2 safety/equipment;From elevated surface         General transfer comment: Attempted stand with walker from elevated bed height with +2 assistance. Patient unable to bring wt over feet despite assist of two - attempting to ski feet  ahead.  With use of Stedy, feet blocked but pt unable to bring hips fwd sufficiently to balance.  Ambulation/Gait             General Gait Details: Pt nonambulatory at this time  Stairs            Wheelchair Mobility    Modified Rankin (Stroke Patients Only)       Balance Overall balance assessment: Needs assistance Sitting-balance support: No upper extremity supported;Feet unsupported Sitting balance-Leahy Scale: Fair Sitting balance - Comments: Initially patient exhibiting posterior lean. Patient's feet unable to stay flat on the floor. Once positioned though patinet able to sit edge of bed with min guard.   Standing balance support: Bilateral upper extremity supported Standing balance-Leahy Scale: Zero                               Pertinent Vitals/Pain Pain Assessment: Faces Faces Pain Scale: Hurts even more Pain Location: L Hip Pain Descriptors / Indicators: Grimacing Pain Intervention(s): Limited activity within patient's tolerance;Monitored during session;Premedicated before session    Home Living Family/patient expects to be discharged to:: Skilled nursing facility                 Additional Comments: memory care unit    Prior Function Level of Independence: Needs assistance   Gait / Transfers Assistance Needed: Predominantly wc bound per chart. Patient reports just transfering to wc at baseline.  ADL's / Homemaking Assistance Needed: Needs assistance for ADLs.  Comments: Minimal verbalizations. Slow to respond.     Hand Dominance   Dominant Hand: Right    Extremity/Trunk Assessment   Upper Extremity Assessment Upper  Extremity Assessment: Defer to OT evaluation RUE Deficits / Details: Grossly functional ROM, 5/5 strength shoulder, elbow ,wrist, 4/5 grip RUE Coordination: decreased fine motor;decreased gross motor (significant tremor in RUE) LUE Deficits / Details: Grossly functional ROM, 5/5 strength shoulder, elbow ,wrist,  4/5 grip    Lower Extremity Assessment Lower Extremity Assessment: RLE deficits/detail;LLE deficits/detail RLE Deficits / Details: Pt resistant to movement on testing but hip and knee flex to 90 on sitting EOB LLE Deficits / Details: Pt resistant to movement on testing but hip flex to 80 and knee flex to 75 in sitting    Cervical / Trunk Assessment Cervical / Trunk Assessment: Normal  Communication   Communication: Expressive difficulties  Cognition Arousal/Alertness: Awake/alert Behavior During Therapy: WFL for tasks assessed/performed;Flat affect Overall Cognitive Status: History of cognitive impairments - at baseline                                 General Comments: vascular dementia      General Comments      Exercises     Assessment/Plan    PT Assessment Patient needs continued PT services  PT Problem List Decreased strength;Decreased range of motion;Decreased activity tolerance;Decreased balance;Decreased mobility;Decreased cognition;Decreased coordination;Decreased knowledge of use of DME;Pain;Decreased knowledge of precautions       PT Treatment Interventions DME instruction;Gait training;Functional mobility training;Therapeutic activities;Therapeutic exercise;Balance training;Patient/family education    PT Goals (Current goals can be found in the Care Plan section)  Acute Rehab PT Goals Patient Stated Goal: Get up to chair PT Goal Formulation: Patient unable to participate in goal setting Time For Goal Achievement: 02/01/20 Potential to Achieve Goals: Fair    Frequency Min 2X/week   Barriers to discharge        Co-evaluation PT/OT/SLP Co-Evaluation/Treatment: Yes Reason for Co-Treatment: For patient/therapist safety PT goals addressed during session: Mobility/safety with mobility OT goals addressed during session: ADL's and self-care       AM-PAC PT "6 Clicks" Mobility  Outcome Measure Help needed turning from your back to your side  while in a flat bed without using bedrails?: A Lot Help needed moving from lying on your back to sitting on the side of a flat bed without using bedrails?: A Lot Help needed moving to and from a bed to a chair (including a wheelchair)?: Total Help needed standing up from a chair using your arms (e.g., wheelchair or bedside chair)?: Total Help needed to walk in hospital room?: Total Help needed climbing 3-5 steps with a railing? : Total 6 Click Score: 8    End of Session Equipment Utilized During Treatment: Gait belt Activity Tolerance: Patient limited by fatigue Patient left: in bed;with call bell/phone within reach;with bed alarm set Nurse Communication: Mobility status PT Visit Diagnosis: History of falling (Z91.81);Difficulty in walking, not elsewhere classified (R26.2);Pain Pain - Right/Left: Left Pain - part of body: Hip    Time: 1610-9604 PT Time Calculation (min) (ACUTE ONLY): 32 min   Charges:   PT Evaluation $PT Eval Moderate Complexity: [place] Pager [phone] Office [phone]   Maree Ainley 01/18/2020, 4:32 PM

## 2020-01-18 NOTE — Progress Notes (Signed)
   ORTHOPAEDIC PROGRESS NOTE  s/p Procedure(s): ARTHROPLASTY LATERAL POSTERIOR APPROACH HIP (HEMIARTHROPLASTY) on 01/17/2020 with Dr. Percell Miller  SUBJECTIVE: Patient confused this morning. He says he is in no pain, but has difficulty following commands.   OBJECTIVE: PE: General: confused, elderly male. No acute distress Left lower extremity: incision CDI, knee immobilizer in place, warm well perfused foot, he has difficulty following commands this morning, wiggles his toes   Vitals:   01/18/20 0106 01/18/20 0503  BP: 122/67 (!) 120/58  Pulse: 79 75  Resp: 17 15  Temp: 98.2 F (36.8 C) 98.1 F (36.7 C)  SpO2: 99% 100%     ASSESSMENT: Hayden [name] is a 79 y.o. male POD#1  PLAN: Weightbearing: WBAT LLE Insicional and dressing care: Reinforce dressings as needed Orthopedic device(s): Knee immobilizer Showering: Post-op day #3 with assistance VTE prophylaxis: Okay to resume Xarelto. Order placed to restart.  Pain control: PRN pain medications. Minimize narcotics as able. Follow - up plan: 2 weeks in office with Dr. Percell Miller Dispo: TBD. PT/OT evaluation pending. Patient came from [facility]. TOC consulted.   WOC RN following for bilateral heel wounds.   Contact information:  Weekdays 8-5 Noemi Chapel PA-C [phone], After hours and holidays please check [email or web address] for group call information for [facility]  Noemi Chapel, PA-C 01/18/2020

## 2020-01-18 NOTE — Progress Notes (Signed)
PROGRESS NOTE  Hayden [name] BSJ:[id number] DOB: 1941-01-31 DOA: 01/16/2020 PCP: Patient, No Pcp Per   LOS: 2 days   Brief Narrative / Interim history: 79 yo M with medical history significant of vascular demetia, DM2, prior right hip fracture with surgical repair admitted on 7/19 with left hip fracture after mechanical fall.  Orthopedic surgery was consulted status post lateral posterior approach hip hemiarthroplasty on 7/20  Subjective / 24h Interval events: -underlying dementia but no complaints.  Knows he had a hip fractured knows he is in the hospital  Assessment & Plan: Principal Problem Left hip fracture-orthopedic surgery consulted, he is status post hemiarthroplasty on 7/20 by Dr. Percell Miller.  He is doing well postop, awaiting physical therapy evaluation, suspect he is needing SNF and he comes from SNF.  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 acetaminophen  500 mg Oral Q6H  . amLODipine  10 mg Oral Daily  . docusate sodium  100 mg Oral BID  . furosemide  20 mg Oral Daily  . insulin aspart  0-15 Units Subcutaneous TID WC  . insulin aspart  0-5 Units Subcutaneous QHS  . insulin detemir  10 Units Subcutaneous QHS  . pantoprazole  40 mg Oral Daily  . pravastatin  20 mg Oral q1800  . risperiDONE  1 mg Oral QHS  . rivaroxaban  20 mg Oral Daily  . sertraline  25 mg Oral Daily  . tamsulosin  0.4 mg Oral Daily   Continuous Infusions: . sodium chloride 75 mL/hr at 01/17/20 1751   PRN Meds:.bisacodyl, haloperidol, HYDROcodone-acetaminophen, HYDROcodone-acetaminophen, magnesium  citrate, menthol-cetylpyridinium **OR** phenol, metoCLOPramide **OR** metoCLOPramide (REGLAN) injection, morphine injection, ondansetron **OR** ondansetron (ZOFRAN) IV, polyethylene glycol, traZODone  Diet Orders (From admission, onward)    Start     Ordered   01/17/20 1750  Diet Carb Modified Fluid consistency: Thin; Room service appropriate? Yes  Diet effective now       Question Answer Comment  Diet-HS Snack? Nothing   Calorie Level Medium 1600-2000   Fluid consistency: Thin   Room service appropriate? Yes      01/17/20 1750          DVT prophylaxis: rivaroxaban (XARELTO) tablet 20 mg Start: 01/18/20 1130 SCDs Start: 01/17/20 1910 rivaroxaban (XARELTO) tablet 20 mg     Code Status: DNR  Family Communication: no family at bedside   Status is: Inpatient  Remains inpatient appropriate because:Inpatient level of care appropriate due to severity of illness   Dispo: The patient is from: SNF              Anticipated d/c is to: SNF              Anticipated d/c date is: 2 days              Patient currently is not medically stable to d/c.  Consultants:  Orthopedic surgery   Procedures:  Hip fracture repair  Microbiology  None   Antimicrobials: none    Objective: Vitals:   01/18/20 0503 01/18/20 1111 01/18/20 1235 01/18/20 1417  BP: (!) 120/58 (!) 155/77  (!) 113/54  Pulse:  75 92  87  Resp: 15 (!) 22 20 20   Temp: 98.1 F (36.7 C) 97.6 F (36.4 C)  98.6 F (37 C)  TempSrc: Oral Oral  Oral  SpO2: 100% 100%  100%    Intake/Output Summary (Last 24 hours) at 01/18/2020 1719 Last data filed at 01/18/2020 1530 Gross per 24 hour  Intake 3950.92 ml  Output 1400 ml  Net 2550.92 ml   There were no vitals filed for this visit.  Examination:  Constitutional: NAD Eyes: no scleral icterus ENMT: Mucous membranes are moist.  Neck: normal, supple Respiratory: clear to auscultation bilaterally, no wheezing, no crackles. Cardiovascular: Regular rate and rhythm, no  murmurs / rubs / gallops.  Abdomen: non distended, no tenderness. Bowel sounds positive.  Musculoskeletal: no clubbing / cyanosis.  Skin: no rashes Neurologic: non focal   Data Reviewed: I have independently reviewed following labs and imaging studies   CBC: Recent Labs  Lab 01/16/20 0630 01/17/20 0351 01/18/20 0325  WBC 16.1* 12.0* 12.1*  NEUTROABS 9.3*  --   --   HGB 13.1 11.9* 11.2*  HCT 38.7* 37.9* 33.2*  MCV 93.3 96.9 91.2  PLT 239 188 741   Basic Metabolic Panel: Recent Labs  Lab 01/16/20 0630 01/17/20 0351 01/18/20 0325  NA 139 134* 136  K 3.4* 3.9 4.1  CL 105 105 103  CO2 24 22 24   GLUCOSE 191* 206* 217*  BUN 16 15 13   CREATININE 0.69 0.56* 0.60*  CALCIUM 8.9 8.6* 8.9   Liver Function Tests: No results for input(s): AST, ALT, ALKPHOS, BILITOT, PROT, ALBUMIN in the last 168 hours. Coagulation Profile: Recent Labs  Lab 01/16/20 0630  INR 1.3*   HbA1C: Recent Labs    01/16/20 0949  HGBA1C 6.7*   CBG: Recent Labs  Lab 01/17/20 1732 01/17/20 2130 01/18/20 0802 01/18/20 1354 01/18/20 1714  GLUCAP 229* 229* 199* 245* 169*    Recent Results (from the past 240 hour(s))  SARS Coronavirus 2 by RT PCR (hospital order, performed in [facility] hospital lab) Nasopharyngeal Nasopharyngeal Swab     Status: None   Collection Time: 01/16/20  6:39 AM   Specimen: Nasopharyngeal Swab  Result Value Ref Range Status   SARS Coronavirus 2 NEGATIVE NEGATIVE Final    Comment: (NOTE) SARS-CoV-2 target nucleic acids are NOT DETECTED.  The SARS-CoV-2 RNA is generally detectable in upper and lower respiratory specimens during the acute phase of infection. The lowest concentration of SARS-CoV-2 viral copies this assay can detect is 250 copies / mL. A negative result does not preclude SARS-CoV-2 infection and should not be used as the sole basis for treatment or other patient management decisions.  A negative result may occur with improper specimen collection /  handling, submission of specimen other than nasopharyngeal swab, presence of viral mutation(s) within the areas targeted by this assay, and inadequate number of viral copies (<250 copies / mL). A negative result must be combined with clinical observations, patient history, and epidemiological information.  Fact Sheet for Patients:   StrictlyIdeas.no  Fact Sheet for Healthcare Providers: BankingDealers.co.za  This test is not yet approved or  cleared by the [place] FDA and has been authorized for detection and/or diagnosis of SARS-CoV-2 by FDA under an Emergency Use Authorization (EUA).  This EUA will remain in effect (meaning this test can be used) for the duration of the COVID-19 declaration under Section 564(b)(1) of the Act, 21 U.S.C. section 360bbb-3(b)(1), unless the authorization is terminated or revoked sooner.  Performed  at [facility], [place] [address], [place], [place] [address]   MRSA PCR Screening     Status: None   Collection Time: 01/16/20 11:25 PM   Specimen: Nasal Mucosa; Nasopharyngeal  Result Value Ref Range Status   MRSA by PCR NEGATIVE NEGATIVE Final    Comment:        The GeneXpert MRSA Assay (FDA approved for NASAL specimens only), is one component of a comprehensive MRSA colonization surveillance program. It is not intended to diagnose MRSA infection nor to guide or monitor treatment for MRSA infections. Performed at [facility], [place] [address] [place],  [address]      Radiology Studies: No results found.   Marzetta Board, MD, PhD [facility]  Between 7 am - 7 pm I am available, please contact me via Amion or Securechat  Between 7 pm - 7 am I am not available, please contact night coverage MD/APP via Amion

## 2020-01-19 LAB — BASIC METABOLIC PANEL
Anion gap: 10 (ref 5–15)
BUN: 14 mg/dL (ref 8–23)
CO2: 25 mmol/L (ref 22–32)
Calcium: 8.7 mg/dL — ABNORMAL LOW (ref 8.9–10.3)
Chloride: 105 mmol/L (ref 98–111)
Creatinine, Ser: 0.71 mg/dL (ref 0.61–1.24)
GFR calc Af Amer: >60 mL/min (ref >60)
GFR calc non Af Amer: >60 mL/min (ref >60)
Glucose, Bld: 198 mg/dL — ABNORMAL HIGH (ref 70–99)
Potassium: 3.7 mmol/L (ref 3.5–5.1)
Sodium: 140 mmol/L (ref 135–145)

## 2020-01-19 LAB — CBC
HCT: 33.3 % — ABNORMAL LOW (ref 39.0–52.0)
Hemoglobin: 10.8 g/dL — ABNORMAL LOW (ref 13.0–17.0)
MCH: 31 pg (ref 26.0–34.0)
MCHC: 32.4 g/dL (ref 30.0–36.0)
MCV: 95.7 fL (ref 80.0–100.0)
Platelets: 209 10*3/uL (ref 150–400)
RBC: 3.48 MIL/uL — ABNORMAL LOW (ref 4.22–5.81)
RDW: 12.5 % (ref 11.5–15.5)
WBC: 10.8 10*3/uL — ABNORMAL HIGH (ref 4.0–10.5)
nRBC: 0 % (ref 0.0–0.2)

## 2020-01-19 LAB — GLUCOSE, CAPILLARY
Glucose-Capillary: 159 mg/dL — ABNORMAL HIGH (ref 70–99)
Glucose-Capillary: 215 mg/dL — ABNORMAL HIGH (ref 70–99)
Glucose-Capillary: 276 mg/dL — ABNORMAL HIGH (ref 70–99)
Glucose-Capillary: 223 mg/dL — ABNORMAL HIGH (ref 70–99)

## 2020-01-19 LAB — SARS CORONAVIRUS 2 BY RT PCR (HOSPITAL ORDER, PERFORMED IN ~~LOC~~ HOSPITAL LAB): SARS Coronavirus 2: NEGATIVE

## 2020-01-19 MED ORDER — HALOPERIDOL 5 MG PO TABS: 5.0000 mg | ORAL_TABLET | Freq: Three times a day (TID) | ORAL | 0 refills | Status: DC | PRN

## 2020-01-19 MED ORDER — RISPERIDONE 1 MG PO TABS: 1.0000 mg | ORAL_TABLET | Freq: Two times a day (BID) | ORAL | 0 refills | Status: DC

## 2020-01-19 NOTE — Progress Notes (Addendum)
   ORTHOPAEDIC PROGRESS NOTE  s/p Procedure(s): ARTHROPLASTY LATERAL POSTERIOR APPROACH HIP (HEMIARTHROPLASTY) on 01/17/2020 with Dr. Percell Miller  SUBJECTIVE: Patient says hip is doing okay this morning. No complaints. Temperature back to 98.3 this morning.   OBJECTIVE: PE: General: confused, elderly male. No acute distress Left lower extremity: incision CDI, knee immobilizer in place, warm well perfused foot, continues to have difficulty following commands but will wiggles his toes   Vitals:   01/18/20 2120 01/19/20 0644  BP: (!) 148/70 123/61  Pulse: 99 72  Resp: 20 18  Temp: (!) 100.4 F (38 C) 98.3 F (36.8 C)  SpO2: 99% 98%     ASSESSMENT: Hayden [name] is a 79 y.o. male POD#2  PLAN: Weightbearing: WBAT LLE. Posterior hip precautions Insicional and dressing care: Change dressings 3 days after surgery. Place new steri-strips if needed. Reapply Mepilex dressing.  Orthopedic device(s): Knee immobilizer Showering: Post-op day #3 with assistance VTE prophylaxis: Restarted on Xarelto yesterday.  Pain control: PRN pain medications. Minimize narcotics as able. Follow - up plan: 2 weeks in office with Dr. Percell Miller Dispo: TBD. PT/OT recommending SNF. Patient came from [facility]. TOC following.   Printed prescription for pain medication placed in patient's chart for discharge. Continue Xarelto for DVT prophylaxis. Okay to discharge back to SNF from orthopedic standpoint, once cleared by medicine team.   Contact information:  Weekdays 8-5 Noemi Chapel PA-C [phone], After hours and holidays please check [email or web address] for group call information for [facility]  Noemi Chapel, PA-C 01/19/2020

## 2020-01-19 NOTE — Plan of Care (Signed)
  Problem: Pain Managment: Goal: General experience of comfort will improve Outcome: Progressing   Problem: Safety: Goal: Ability to remain free from injury will improve Outcome: Progressing   Problem: Skin Integrity: Goal: Risk for impaired skin integrity will decrease Outcome: Progressing   

## 2020-01-19 NOTE — Discharge Instructions (Signed)
[facility] [address] [phone] (fax)   [place] may remove the dressings over your incisions on Sunday July 25 - Place new steri-strips over the incisions - Leave steri-strips in place until they fall off on their own - You may reapply new dressings as needed - Gently pat the area dry.  - Do not soak the incisions in water. Do not go swimming in the pool or ocean until 4 weeks after surgery - [place].  EXERCISES - Follow the instructions of your therapist.  No specific exercises necessary outside of this. - You may bear weight on your operative leg. - Please continue to ambulate and do not stay sitting or lying for too long. Perform foot and wrist pumps to assist in circulation.  FOLLOW-UP - If you develop a Fever (>101.5), Redness or Drainage from the surgical incision site, please call our office to arrange for an evaluation. - Please call the office to schedule a follow-up appointment for your incision check, 2 weeks after hospital discharge  IF YOU HAVE ANY QUESTIONS, PLEASE FEEL FREE TO CALL OUR OFFICE.  HELPFUL INFORMATION  You should wean off your narcotic medicines as soon as you are able.  Most patients will be off or using minimal narcotics before their first postop appointment.   We suggest you use the pain medication the first night prior to going to bed, in order to ease any pain when the anesthesia wears off. You should avoid taking pain medications on an empty stomach as it will make you nauseous.  Do not drink alcoholic beverages or take illicit drugs when taking pain medications.  Pain medication may make you constipated.  Below are a few solutions to try in this order: Decrease the amount of pain medication if you aren't having pain. Drink lots of decaffeinated fluids. Drink prune juice and/or each dried prunes  If the first 3  don't work start with additional solutions Take Colace - an over-the-counter stool softener Take Senokot - an over-the-counter laxative Take Miralax - a stronger over-the-counter laxative

## 2020-01-19 NOTE — Discharge Summary (Addendum)
Physician Discharge Summary  LEONDRO CORYELL DUK:[id number] DOB: 1941-01-15 DOA: 01/16/2020  PCP: Patient, No Pcp Per  Admit date: 01/16/2020 Discharge date: 01/20/2020  Admitted From: SNF Disposition:  SNF   Recommendations for Outpatient Follow-up:  1. Follow up with Dr. Percell Miller in 2 weeks  Home Health: none Equipment/Devices: none  Discharge Condition: stable CODE STATUS: DNR Diet recommendation: regular  HPI: Per admitting MD, GORDEN STTHOMAS is a 79 y.o. male with medical history significant of vascular dementia, DM2, prior R hip fracture s/p surgical repair. Pt presents after mechanical fall on the morning of admit. Pt reports trying to transfer to wheelchair the morning of admit, resulting in mechanical fall onto L side and resultant L hip pain. Pt was brought in ED for further work up  [place] / Discharge diagnoses:  Principal Problem Left hip fracture-orthopedic surgery consulted, he is status post hemiarthroplasty on 7/20 by Dr. Percell Miller.  He is doing well postop, continue pain control, PT, Xarelto. 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   acetaminophen 325 MG tablet Commonly known as:  TYLENOL Take 2 tablets (650 mg total) by mouth every 6 (six) hours as needed for mild pain (or Fever >/= 101).   amLODipine 10 MG tablet Commonly known as: NORVASC Take 1 tablet (10 mg total) by mouth daily.   BAZA PROTECT EX Apply 1 application topically as needed (incontinence episode). Buttocks and scrotum   celecoxib 200 MG capsule Commonly known as: CELEBREX Take 200 mg by mouth 2 (two) times daily.   Cholecalciferol 50 MCG (2000 UT) Caps Take 2,000 Units by mouth daily.   Dermacloud Crea Apply 1 application topically in the morning and at bedtime. Buttocks   Docusate Sodium 100 MG capsule Take 100 mg by mouth daily.   donepezil 10 MG tablet Commonly known as: ARICEPT Take 1 tablet (10 mg total) by mouth at bedtime.   furosemide 20 MG tablet Commonly known as: LASIX Take 1 tablet (20 mg total) by mouth daily.   guaiFENesin 100 MG/5ML liquid Commonly known as: ROBITUSSIN Take 200 mg by mouth every 6 (six) hours as needed for cough.   haloperidol 5 MG tablet Commonly known as: HALDOL Take 1 tablet (5 mg total) by mouth every 8 (eight) hours as needed for agitation.   insulin lispro 100 UNIT/ML KwikPen Commonly known as: HUMALOG Inject 6 Units into the skin 3 (three) times daily with meals.   Levemir FlexTouch 100 UNIT/ML FlexPen Generic drug: insulin detemir Inject 24 Units into the skin at bedtime.   loperamide 2 MG capsule Commonly known as: IMODIUM Take 2 mg by mouth See admin instructions. Take 2 mg by mouth  with each loose stool as needed for diarrhea (do not exceed 8 doses in 24 hours)   lovastatin 20 MG tablet Commonly known as: MEVACOR Take 20 mg by mouth daily.   magnesium hydroxide 400 MG/5ML suspension Commonly known as: MILK OF MAGNESIA Take 30 mLs by mouth at bedtime as needed for mild constipation.   neomycin-bacitracin-polymyxin [id number] ointment Apply 1 application topically See admin instructions. *Standing order for skin tear,  abrasions* Clean area with normal saline. Apply tao, cover with bandaid or gauze and tape. Change as needed until healed.   omeprazole 20 MG capsule Commonly known as: PRILOSEC Take 1 capsule (20 mg total) by mouth daily.   oxyCODONE 5 MG immediate release tablet Commonly known as: Oxy IR/ROXICODONE Take 0.5-1 pills every 6 hrs as needed for pain   risperiDONE 1 MG tablet Commonly known as: RISPERDAL Take 1 tablet (1 mg total) by mouth in the morning and at bedtime.   rivaroxaban 20 MG Tabs tablet Commonly known as: XARELTO Take 1 tablet (20 mg total) by mouth daily with supper. Patient will be on Xarelto loading dose until 06/07/2019, then he will be transitioned to this dose Xarelto 20 mg p.o. once daily on 06/08/2019 What changed:   when to take this  additional instructions   sertraline 25 MG tablet Commonly known as: ZOLOFT Take 25 mg by mouth daily.   tamsulosin 0.4 MG Caps capsule Commonly known as: FLOMAX Take 0.4 mg by mouth daily.   traZODone 50 MG tablet Commonly known as: DESYREL Take 50 mg by mouth every 8 (eight) hours as needed (agitation/depression).      Consultations:  Orthopedic surgery   Procedures/Studies:  DG Chest 1 View  Result Date: 01/16/2020 CLINICAL DATA:  Unwitnessed fall. EXAM: CHEST  1 VIEW COMPARISON:  12/25/2019 FINDINGS: The cardiac silhouette, mediastinal and hilar contours are within normal limits and stable. Mild tortuosity and calcification of the thoracic aorta. Low lung volumes with vascular crowding and streaky atelectasis. No definite infiltrates, effusions or pneumothorax. The bony thorax is intact. IMPRESSION: Low lung volumes with vascular crowding and streaky atelectasis. Electronically Signed   By: Marijo Sanes M.D.   On: 01/16/2020 05:46   DG Ankle Complete Left  Result Date: 01/16/2020 CLINICAL DATA:  [place]. Left ankle pain. EXAM: LEFT ANKLE COMPLETE - 3+ VIEW COMPARISON:  None. FINDINGS: The ankle mortise is maintained.  No acute ankle fracture is identified. The mid and hindfoot bony structures are intact. Extensive vascular calcifications. IMPRESSION: No acute ankle fracture. Electronically Signed   By: Marijo Sanes M.D.   On: 01/16/2020 05:51   CT Head Wo Contrast  Result Date: 12/25/2019 CLINICAL DATA:  Altered mental status. EXAM: CT HEAD WITHOUT CONTRAST TECHNIQUE: Contiguous axial images were obtained from the base of the skull through the vertex without intravenous contrast. COMPARISON:  12/03/2019 FINDINGS: Brain: No significant change in mild-to-moderate enlargement of the ventricles and subarachnoid spaces. Mild-to-moderate patchy white matter low density in both cerebral hemispheres without significant change. No intracranial hemorrhage, mass lesion or CT evidence of acute infarction. Vascular: No hyperdense vessel or unexpected calcification. Skull: Normal. Negative for fracture or focal lesion. Sinuses/Orbits: Status post bilateral cataract extraction. Hypoplastic right frontal sinus with a small retention cyst. Other: None. IMPRESSION: 1. No acute abnormality. 2. Stable mild-to-moderate diffuse cerebral and cerebellar atrophy. 3. Stable mild-to-moderate chronic small vessel white matter ischemic changes in both cerebral hemispheres. Electronically Signed   By: Claudie Revering M.D.   On: 12/25/2019 10:46   DG  Chest Port 1 View  Result Date: 12/25/2019 CLINICAL DATA:  Pt from [facility] with [facility] for weakness and lethargy for the past 3 days per staff, states he has not been himself. Dementia and WC bound. Pt unable to give hx. EXAM: PORTABLE CHEST 1 VIEW COMPARISON:  12/03/2019. FINDINGS: Cardiac silhouette is normal in size. No mediastinal or hilar masses. No convincing adenopathy. Clear lungs.  No pleural effusion or pneumothorax. Skeletal structures are grossly intact. IMPRESSION: No active disease. Electronically Signed   By: Lajean Manes M.D.   On: 12/25/2019 10:29   DG Knee Complete 4 Views  Left  Result Date: 01/16/2020 CLINICAL DATA:  [place]. Left knee pain. EXAM: LEFT KNEE - COMPLETE 4+ VIEW COMPARISON:  None. FINDINGS: The joint spaces are maintained. Minimal degenerative changes for age. No acute fracture is identified. No definite joint effusion. Vascular calcifications are noted. IMPRESSION: No acute bony findings or joint effusion. Electronically Signed   By: Marijo Sanes M.D.   On: 01/16/2020 05:50   DG Foot Complete Right  Result Date: 01/16/2020 CLINICAL DATA:  Right femoral neck fracture, right heel pain EXAM: RIGHT FOOT COMPLETE - 3+ VIEW COMPARISON:  None. FINDINGS: Normal alignment. No fracture or dislocation. Joint spaces are preserved. Advanced vascular calcifications are seen within the soft tissues. IMPRESSION: No acute fracture or dislocation. Electronically Signed   By: Fidela Salisbury MD   On: 01/16/2020 16:41   DG Hip Port Unilat With Pelvis 1V Left  Result Date: 01/17/2020 CLINICAL DATA:  Postoperative left hip. EXAM: DG HIP (WITH OR WITHOUT PELVIS) 1V PORT LEFT COMPARISON:  January 16, 2020 FINDINGS: Left hip hemiarthroplasty with normal alignment of the orthopedic hardware. The fractured left femoral neck has been resected. Expected soft tissue emphysema. Stable appearance of right IM nail fixation of subtrochanteric right femoral fracture. IMPRESSION: Left hip hemiarthroplasty with normal alignment of the orthopedic hardware. Electronically Signed   By: Fidela Salisbury M.D.   On: 01/17/2020 13:08   DG Hip Unilat W or Wo Pelvis 2-3 Views Left  Result Date: 01/16/2020 CLINICAL DATA:  Unwitnessed fall. Left hip pain. EXAM: DG HIP (WITH OR WITHOUT PELVIS) 2-3V LEFT COMPARISON:  11/09/2013 FINDINGS: There is a displaced fracture of the left femoral neck with a varus deformity. Right hip hardware is noted. No complicating features. The pubic symphysis and SI joints are intact. No pelvic fractures or bone lesions. Stable vascular calcifications. IMPRESSION: Displaced  left femoral neck fracture. Electronically Signed   By: Marijo Sanes M.D.   On: 01/16/2020 05:49    Subjective: - no chest pain, shortness of breath, no abdominal pain, nausea or vomiting.   Discharge Exam: BP (!) 147/61 (BP Location: Right Arm)   Pulse 87   Temp 99.2 F (37.3 C) (Oral)   Resp 14   Ht 5\' 8"  (1.727 m)   SpO2 96%   BMI 20.45 kg/m   General: Pt is alert, awake, not in acute distress Cardiovascular: RRR, S1/S2 +, no rubs, no gallops Respiratory: CTA bilaterally, no wheezing, no rhonchi Abdominal: Soft, NT, ND, bowel sounds + Extremities: no edema, no cyanosis  The results of significant diagnostics from this hospitalization (including imaging, microbiology, ancillary and laboratory) are listed below for reference.     Microbiology: Recent Results (from the past 240 hour(s))  SARS Coronavirus 2 by RT PCR (hospital order, performed in [facility] hospital lab) Nasopharyngeal Nasopharyngeal Swab     Status: None   Collection Time: 01/16/20  6:39 AM   Specimen:  Nasopharyngeal Swab  Result Value Ref Range Status   SARS Coronavirus 2 NEGATIVE NEGATIVE Final    Comment: (NOTE) SARS-CoV-2 target nucleic acids are NOT DETECTED.  The SARS-CoV-2 RNA is generally detectable in upper and lower respiratory specimens during the acute phase of infection. The lowest concentration of SARS-CoV-2 viral copies this assay can detect is 250 copies / mL. A negative result does not preclude SARS-CoV-2 infection and should not be used as the sole basis for treatment or other patient management decisions.  A negative result may occur with improper specimen collection / handling, submission of specimen other than nasopharyngeal swab, presence of viral mutation(s) within the areas targeted by this assay, and inadequate number of viral copies (<250 copies / mL). A negative result must be combined with clinical observations, patient history, and epidemiological information.  Fact Sheet  for Patients:   StrictlyIdeas.no  Fact Sheet for Healthcare Providers: BankingDealers.co.za  This test is not yet approved or  cleared by the [place] FDA and has been authorized for detection and/or diagnosis of SARS-CoV-2 by FDA under an Emergency Use Authorization (EUA).  This EUA will remain in effect (meaning this test can be used) for the duration of the COVID-19 declaration under Section 564(b)(1) of the Act, 21 U.S.C. section 360bbb-3(b)(1), unless the authorization is terminated or revoked sooner.  Performed at [facility], [place] [address], [place], [place] [address]   MRSA PCR Screening     Status: None   Collection Time: 01/16/20 11:25 PM   Specimen: Nasal Mucosa; Nasopharyngeal  Result Value Ref Range Status   MRSA by PCR NEGATIVE NEGATIVE Final    Comment:        The GeneXpert MRSA Assay (FDA approved for NASAL specimens only), is one component of a comprehensive MRSA colonization surveillance program. It is not intended to diagnose MRSA infection nor to guide or monitor treatment for MRSA infections. Performed at [facility], [place] [address], [place], [place] [address]   SARS Coronavirus 2 by RT PCR (hospital order, performed in [facility] hospital lab) Nasopharyngeal Nasopharyngeal Swab     Status: None   Collection Time: 01/19/20 10:30 AM   Specimen: Nasopharyngeal Swab  Result Value Ref Range Status   SARS Coronavirus 2 NEGATIVE NEGATIVE Final    Comment: (NOTE) SARS-CoV-2 target nucleic acids are NOT DETECTED.  The SARS-CoV-2 RNA is generally detectable in upper and lower respiratory specimens during the acute phase of infection. The lowest concentration of SARS-CoV-2 viral copies this assay can detect is 250 copies / mL. A negative result does not preclude SARS-CoV-2 infection and should not be used as the sole basis for treatment or other patient management  decisions.  A negative result may occur with improper specimen collection / handling, submission of specimen other than nasopharyngeal swab, presence of viral mutation(s) within the areas targeted by this assay, and inadequate number of viral copies (<250 copies / mL). A negative result must be combined with clinical observations, patient history, and epidemiological information.  Fact Sheet for Patients:   StrictlyIdeas.no  Fact Sheet for Healthcare Providers: BankingDealers.co.za  This test is not yet approved or  cleared by the [place] FDA and has been authorized for detection and/or diagnosis of SARS-CoV-2 by FDA under an Emergency Use Authorization (EUA).  This EUA will remain in effect (meaning this test can be used) for the duration of the COVID-19 declaration under Section 564(b)(1) of the Act, 21 U.S.C. section 360bbb-3(b)(1), unless the authorization is  terminated or revoked sooner.  Performed at [facility], [place] [address], [place], [place] [address]      Labs: Basic Metabolic Panel: Recent Labs  Lab 01/16/20 0630 01/17/20 0351 01/18/20 0325 01/19/20 0329  NA 139 134* 136 140  K 3.4* 3.9 4.1 3.7  CL 105 105 103 105  CO2 24 22 24 25   GLUCOSE 191* 206* 217* 198*  BUN 16 15 13 14   CREATININE 0.69 0.56* 0.60* 0.71  CALCIUM 8.9 8.6* 8.9 8.7*   Liver Function Tests: No results for input(s): AST, ALT, ALKPHOS, BILITOT, PROT, ALBUMIN in the last 168 hours. CBC: Recent Labs  Lab 01/16/20 0630 01/17/20 0351 01/18/20 0325 01/19/20 0329 01/20/20 0311  WBC 16.1* 12.0* 12.1* 10.8* 13.9*  NEUTROABS 9.3*  --   --   --   --   HGB 13.1 11.9* 11.2* 10.8* 11.9*  HCT 38.7* 37.9* 33.2* 33.3* 34.9*  MCV 93.3 96.9 91.2 95.7 90.9  PLT 239 188 195 209 257   CBG: Recent Labs  Lab 01/19/20 1225 01/19/20 1707 01/19/20 2200 01/20/20 0742 01/20/20 1228  GLUCAP 215* 276* 223* 205* 256*   Hgb  A1c No results for input(s): HGBA1C in the last 72 hours. Lipid Profile No results for input(s): CHOL, HDL, LDLCALC, TRIG, CHOLHDL, LDLDIRECT in the last 72 hours. Thyroid function studies No results for input(s): TSH, T4TOTAL, T3FREE, THYROIDAB in the last 72 hours.  Invalid input(s): FREET3 Urinalysis    Component Value Date/Time   COLORURINE YELLOW 12/25/2019 [place] 12/25/2019 1208   LABSPEC 1.014 12/25/2019 1208   PHURINE 6.0 12/25/2019 [place] 12/25/2019 1208   HGBUR NEGATIVE 12/25/2019 1208   BILIRUBINUR NEGATIVE 12/25/2019 1208   KETONESUR NEGATIVE 12/25/2019 1208   PROTEINUR NEGATIVE 12/25/2019 1208   NITRITE NEGATIVE 12/25/2019 1208   LEUKOCYTESUR NEGATIVE 12/25/2019 1208    FURTHER DISCHARGE INSTRUCTIONS:   Get Medicines reviewed and adjusted: Please take all your medications with you for your next visit with your Primary MD   Laboratory/radiological data: Please request your Primary MD to go over all hospital tests and procedure/radiological results at the follow up, please ask your Primary MD to get all Hospital records sent to his/her office.   In some cases, they will be blood work, cultures and biopsy results pending at the time of your discharge. Please request that your primary care M.D. goes through all the records of your hospital data and follows up on these results.   Also Note the following: If you experience worsening of your admission symptoms, develop shortness of breath, life threatening emergency, suicidal or homicidal thoughts you must seek medical attention immediately by calling 911 or calling your MD immediately  if symptoms less severe.   You must read complete instructions/literature along with all the possible adverse reactions/side effects for all the Medicines you take and that have been prescribed to you. Take any new Medicines after you have completely understood and accpet all the possible adverse  reactions/side effects.    Do not drive when taking Pain medications or sleeping medications (Benzodaizepines)   Do not take more than prescribed Pain, Sleep and Anxiety Medications. It is not advisable to combine anxiety,sleep and pain medications without talking with your primary care practitioner   Special Instructions: If you have smoked or chewed Tobacco  in the last 2 yrs please stop smoking, stop any regular Alcohol  and or any Recreational drug use.   Wear Seat belts while driving.  Please note: You were cared for by a hospitalist during your hospital stay. Once you are discharged, your primary care physician will handle any further medical issues. Please note that NO REFILLS for any discharge medications will be authorized once you are discharged, as it is imperative that you return to your primary care physician (or establish a relationship with a primary care physician if you do not have one) for your post hospital discharge needs so that they can reassess your need for medications and monitor your lab values.  Time coordinating discharge: 35 minutes  SIGNED:  Marzetta Board, MD, PhD 01/20/2020, 2:10 PM

## 2020-01-19 NOTE — NC FL2 (Addendum)
[place] LEVEL OF CARE SCREENING TOOL     IDENTIFICATION  Patient Name: Hayden [name] Birthdate: 10-24-1940 Sex: male Admission Date (Current Location): 01/16/2020  [facility] and [place] Number:  Herbalist and Address:  [facility],  [place], [place]      Provider Number: [id number]  Attending Physician Name and Address:  Caren Griffins, MD  Relative Name and Phone Number:  brother, Saagar Tortorella @ [phone]    Current Level of Care: Hospital Recommended Level of Care: [place] Prior Approval Number:    Date Approved/Denied:   PASRR Number:    Discharge Plan: SNF    Current Diagnoses: Patient Active Problem List   Diagnosis Date Noted  . Closed right hip fracture ([place]) 01/16/2020  . Left displaced femoral neck fracture ([place]) 01/16/2020  . DNR (do not resuscitate) 01/16/2020  . Right lower lobe pneumonia 12/04/2019  . Hospice care patient 05/19/2019  . Acute pulmonary embolus ([place]) 05/14/2019  . Acute pulmonary embolism ([place]) 05/14/2019  . Pneumonia due to COVID-19 virus   . Aspiration pneumonitis ([place]) 05/13/2019  . Closed fracture of right hip ([place])   . Femur fracture, right ([place]) 02/10/2019  . Fall   . Acute encephalopathy 07/02/2018  . Dementia without behavioral disturbance ([place]) 05/24/2018  . Pressure injury of skin 05/23/2018  . Cellulitis 04/17/2018  . Cellulitis, scrotum 03/09/2018  . Cellulitis of male genitalia 03/08/2018  . Depression 03/08/2018  . BPH (benign prostatic hyperplasia) 03/08/2018  . Leukocytosis 03/08/2018  . Mild dementia ([place]) 10/23/2017  . Lower urinary tract infectious disease 09/06/2017  . Hyperglycemia 09/06/2017  . ARF (acute renal failure) ([place]) 09/06/2017  . Altered mental status 09/06/2017  . Type 2 diabetes mellitus without complication, with long-term current use of insulin ([place]) 04/02/2017  . 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 Bilateral hand pain 12/09/2016  . CLL (chronic lymphocytic leukemia) ([place]) 09/01/2013    Orientation RESPIRATION BLADDER Height & Weight     Self, Situation  Normal Incontinent Weight:   Height:     BEHAVIORAL SYMPTOMS/MOOD NEUROLOGICAL BOWEL NUTRITION STATUS      Incontinent    AMBULATORY STATUS COMMUNICATION OF NEEDS Skin   Extensive Assist Verbally Surgical wounds (surgery incision site)                       Personal Care Assistance Level of Assistance  Bathing, Dressing Bathing Assistance: Maximum assistance   Dressing Assistance: Limited assistance     Functional Limitations Info             SPECIAL CARE FACTORS FREQUENCY  PT (By licensed PT), OT (By licensed OT)     PT Frequency: 5x/wk OT Frequency: 5x/wk            Contractures Contractures Info: Not present    Additional Factors Info  Code Status, Allergies, Psychotropic Code Status Info: DNR Allergies Info: see MAR Psychotropic Info: see MAR         Current Medications (01/19/2020):  This is the current hospital active medication list Current Facility-Administered Medications  Medication Dose Route Frequency Provider Last Rate Last Admin  . 0.9 %  sodium chloride infusion   Intravenous Continuous Ethelda Chick, PA-C 75 mL/hr at 01/17/20 1751 New Bag at 01/17/20 1751  . acetaminophen (TYLENOL) tablet 500 mg  500 mg Oral Q6H McBane, Maylene Roes, PA-C  500 mg at 01/19/20 0545  . amLODipine (NORVASC) tablet 10 mg  10 mg Oral Daily Ethelda Chick, PA-C   10 mg at 01/19/20 0851  . bisacodyl (DULCOLAX) EC tablet 5 mg  5 mg Oral Daily PRN McBane, Maylene Roes, PA-C      . docusate sodium (COLACE) capsule 100 mg  100 mg Oral BID Ethelda Chick, PA-C   100 mg at 01/19/20 7564  . furosemide (LASIX) tablet 20 mg  20 mg Oral Daily Ethelda Chick, PA-C   20 mg at 01/19/20 0851  . haloperidol (HALDOL) tablet 5 mg  5 mg Oral Q8H PRN McBane,  Maylene Roes, PA-C      . HYDROcodone-acetaminophen (NORCO) 7.5-325 MG per tablet 1 tablet  1 tablet Oral Q6H PRN McBane, Maylene Roes, PA-C      . HYDROcodone-acetaminophen (NORCO/VICODIN) 5-325 MG per tablet 1-2 tablet  1-2 tablet Oral Q6H PRN Ethelda Chick, PA-C   1 tablet at 01/18/20 2216  . insulin aspart (novoLOG) injection 0-15 Units  0-15 Units Subcutaneous TID WC Ethelda Chick, PA-C   3 Units at 01/19/20 0850  . insulin aspart (novoLOG) injection 0-5 Units  0-5 Units Subcutaneous QHS Ethelda Chick, PA-C   2 Units at 01/18/20 2215  . insulin detemir (LEVEMIR) injection 10 Units  10 Units Subcutaneous QHS Vashti Hey, MD   10 Units at 01/18/20 2214  . magnesium citrate solution 1 Bottle  1 Bottle Oral Once PRN McBane, Maylene Roes, PA-C      . menthol-cetylpyridinium (CEPACOL) lozenge 3 mg  1 lozenge Oral PRN McBane, Maylene Roes, PA-C       Or  . phenol (CHLORASEPTIC) mouth spray 1 spray  1 spray Mouth/Throat PRN McBane, Maylene Roes, PA-C      . metoCLOPramide (REGLAN) tablet 5-10 mg  5-10 mg Oral Q8H PRN McBane, Maylene Roes, PA-C       Or  . metoCLOPramide (REGLAN) injection 5-10 mg  5-10 mg Intravenous Q8H PRN McBane, Caroline N, PA-C      . morphine 2 MG/ML injection 0.5 mg  0.5 mg Intravenous Q2H PRN McBane, Caroline N, PA-C      . ondansetron (ZOFRAN) tablet 4 mg  4 mg Oral Q6H PRN McBane, Maylene Roes, PA-C       Or  . ondansetron (ZOFRAN) injection 4 mg  4 mg Intravenous Q6H PRN McBane, Maylene Roes, PA-C      . pantoprazole (PROTONIX) EC tablet 40 mg  40 mg Oral Daily Ethelda Chick, PA-C   40 mg at 01/19/20 0851  . polyethylene glycol (MIRALAX / GLYCOLAX) packet 17 g  17 g Oral Daily PRN McBane, Maylene Roes, PA-C      . pravastatin (PRAVACHOL) tablet 20 mg  20 mg Oral q1800 Bonnell Public Tublu, MD   20 mg at 01/18/20 1832  . risperiDONE (RISPERDAL) tablet 1 mg  1 mg Oral QHS Bonnell Public Tublu, MD   1 mg at 01/18/20 2216  . rivaroxaban (XARELTO)  tablet 20 mg  20 mg Oral Daily Ethelda Chick, PA-C   20 mg at 01/19/20 3329  . sertraline (ZOLOFT) tablet 25 mg  25 mg Oral Daily Bonnell Public Tublu, MD   25 mg at 01/19/20 0852  . tamsulosin (FLOMAX) capsule 0.4 mg  0.4 mg Oral Daily Ethelda Chick, PA-C   0.4 mg at 01/19/20 5188  . traZODone (DESYREL) tablet 50 mg  50 mg Oral Q8H PRN Bonnell Public Tublu,  MD         Discharge Medications: Please see discharge summary for a list of discharge medications.  Relevant Imaging Results:  Relevant Lab Results:   Additional Information SS# [id number]  Lennart Pall, LCSW

## 2020-01-19 NOTE — TOC Progression Note (Signed)
Transition of Care [name]) - Progression Note    Patient Details  Name: Hayden [name] MRN: [id number] Date of Birth: April 14, 1941  [facility] CM/SW Contact  Lennart Pall, LCSW Phone Number: 01/19/2020, 9:56 AM  Clinical Narrative:   Have spoken with pt and updated both his brother, [name] and sister, [name] (contact # on chart) that plan now for SNF/ short term rehab.  All agreeable.  Pt has been to [facility] in the past and sister prefers he return.  Awaiting PASRR clearance at this time.  Continue to follow.    Expected Discharge Plan: Memory Care (vs. possible short term SNF) Barriers to Discharge: Continued Medical Work up  Expected Discharge Plan and Services Expected Discharge Plan: Memory Care (vs. possible short term SNF) In-house Referral: Clinical Social Work     Living arrangements for the past 2 months: [place] (ALF/ Memory Care at [facility])                                       Social Determinants of Health (SDOH) Interventions    Readmission Risk Interventions No flowsheet data found.

## 2020-01-19 NOTE — Progress Notes (Signed)
PROGRESS NOTE  Hayden [name] XTK:[id number] DOB: [birth year] DOA: 01/16/2020 PCP: Patient, No Pcp Per   LOS: 3 days   Brief Narrative / Interim history: 79 yo M with medical history significant of vascular demetia, DM2, prior right hip fracture with surgical repair admitted on 7/19 with left hip fracture after mechanical fall.  Orthopedic surgery was consulted status post lateral posterior approach hip hemiarthroplasty on 7/20  Subjective / 24h Interval events: -he is feeling well, no chest pain, no shortness of breath   Assessment & Plan: Principal Problem Left hip fracture-orthopedic surgery consulted, he is status post hemiarthroplasty on 7/20 by Dr. Percell Miller.  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 acetaminophen  500 mg Oral Q6H  . amLODipine  10 mg Oral Daily  . docusate sodium  100 mg Oral BID  . furosemide  20 mg Oral Daily  . insulin aspart  0-15 Units Subcutaneous TID WC  . insulin aspart  0-5 Units Subcutaneous QHS  . insulin detemir  10 Units Subcutaneous QHS  . pantoprazole  40 mg Oral Daily  . pravastatin  20 mg Oral q1800  . risperiDONE  1 mg Oral QHS  . rivaroxaban  20 mg Oral Daily  . sertraline  25 mg Oral Daily  . tamsulosin  0.4 mg Oral Daily   Continuous Infusions: . sodium chloride 75 mL/hr at 01/17/20 1751   PRN Meds:.bisacodyl, haloperidol, HYDROcodone-acetaminophen, HYDROcodone-acetaminophen, magnesium citrate, menthol-cetylpyridinium **OR** phenol, metoCLOPramide **OR** metoCLOPramide (REGLAN) injection, morphine injection, ondansetron **OR** ondansetron (ZOFRAN) IV, polyethylene glycol,  traZODone  Diet Orders (From admission, onward)    Start     Ordered   01/17/20 1750  Diet Carb Modified Fluid consistency: Thin; Room service appropriate? Yes  Diet effective now       Question Answer Comment  Diet-HS Snack? Nothing   Calorie Level Medium 1600-2000   Fluid consistency: Thin   Room service appropriate? Yes      01/17/20 1750          DVT prophylaxis: rivaroxaban (XARELTO) tablet 20 mg Start: 01/18/20 1130 SCDs Start: 01/17/20 1910 rivaroxaban (XARELTO) tablet 20 mg     Code Status: DNR  Family Communication: no family at bedside   Status is: Inpatient  Remains inpatient appropriate because:Inpatient level of care appropriate due to severity of illness   Dispo: The patient is from: SNF              Anticipated d/c is to: SNF              Anticipated d/c date is: 2 days              Patient currently is medically stable to d/c.  Consultants:  Orthopedic surgery   Procedures:  Hip fracture repair  Microbiology  None   Antimicrobials: none    Objective: Vitals:   01/18/20 1235 01/18/20 1417 01/18/20 2120 01/19/20 0644  BP:  (!) 113/54 (!) 148/70 123/61  Pulse:  87 99 72  Resp: 20 20 20 18   Temp:  98.6 F (37 C) (!) 100.4 F (38 C) 98.3 F (36.8 C)  TempSrc:  Oral Axillary Axillary  SpO2:  100% 99% 98%    Intake/Output Summary (Last 24 hours) at 01/19/2020 1403 Last data filed at 01/19/2020 0845 Gross per 24 hour  Intake 2253.5 ml  Output 2950 ml  Net -696.5 ml   There were no vitals filed for this visit.  Examination:  Constitutional: no distress Eyes: no icterus  ENMT: mmm Neck: normal, supple Respiratory: cta biL, no wheezing Cardiovascular: rrr, no mrn, no edema Abdomen: soft, nt, nd Musculoskeletal: no clubbing / cyanosis.  Skin: no rashes Neurologic: non focal  Data Reviewed: I have independently reviewed following labs and imaging studies   CBC: Recent Labs  Lab 01/16/20 0630 01/17/20 0351 01/18/20 0325  01/19/20 0329  WBC 16.1* 12.0* 12.1* 10.8*  NEUTROABS 9.3*  --   --   --   HGB 13.1 11.9* 11.2* 10.8*  HCT 38.7* 37.9* 33.2* 33.3*  MCV 93.3 96.9 91.2 95.7  PLT 239 188 195 970   Basic Metabolic Panel: Recent Labs  Lab 01/16/20 0630 01/17/20 0351 01/18/20 0325 01/19/20 0329  NA 139 134* 136 140  K 3.4* 3.9 4.1 3.7  CL 105 105 103 105  CO2 24 22 24 25   GLUCOSE 191* 206* 217* 198*  BUN 16 15 13 14   CREATININE 0.69 0.56* 0.60* 0.71  CALCIUM 8.9 8.6* 8.9 8.7*   Liver Function Tests: No results for input(s): AST, ALT, ALKPHOS, BILITOT, PROT, ALBUMIN in the last 168 hours. Coagulation Profile: Recent Labs  Lab 01/16/20 0630  INR 1.3*   HbA1C: No results for input(s): HGBA1C in the last 72 hours. CBG: Recent Labs  Lab 01/18/20 1354 01/18/20 1714 01/18/20 2214 01/19/20 0731 01/19/20 1225  GLUCAP 245* 169* 203* 159* 215*    Recent Results (from the past 240 hour(s))  SARS Coronavirus 2 by RT PCR (hospital order, performed in [facility] hospital lab) Nasopharyngeal Nasopharyngeal Swab     Status: None   Collection Time: 01/16/20  6:39 AM   Specimen: Nasopharyngeal Swab  Result Value Ref Range Status   SARS Coronavirus 2 NEGATIVE NEGATIVE Final    Comment: (NOTE) SARS-CoV-2 target nucleic acids are NOT DETECTED.  The SARS-CoV-2 RNA is generally detectable in upper and lower respiratory specimens during the acute phase of infection. The lowest concentration of SARS-CoV-2 viral copies this assay can detect is 250 copies / mL. A negative result does not preclude SARS-CoV-2 infection and should not be used as the sole basis for treatment or other patient management decisions.  A negative result may occur with improper specimen collection / handling, submission of specimen other than nasopharyngeal swab, presence of viral mutation(s) within the areas targeted by this assay, and inadequate number of viral copies (<250 copies / mL). A negative result must be combined  with clinical observations, patient history, and epidemiological information.  Fact Sheet for Patients:   StrictlyIdeas.no  Fact Sheet for Healthcare Providers: BankingDealers.co.za  This test is not yet approved or  cleared by the [place] FDA and has been authorized for detection and/or diagnosis of SARS-CoV-2 by FDA under an Emergency Use Authorization (EUA).  This EUA will remain in effect (meaning this test can be used) for the duration of the COVID-19 declaration under Section 564(b)(1) of the Act, 21 U.S.C. section 360bbb-3(b)(1), unless the authorization is terminated or revoked sooner.  Performed at [facility], [place] [address] [place], [place] [address]   MRSA PCR Screening     Status: None   Collection Time: 01/16/20 11:25 PM   Specimen: Nasal Mucosa; Nasopharyngeal  Result Value Ref Range Status   MRSA by PCR NEGATIVE NEGATIVE Final    Comment:        The GeneXpert MRSA Assay (FDA approved for NASAL specimens only), is one component of a comprehensive MRSA colonization surveillance program. It is not intended to diagnose MRSA infection nor to guide or monitor treatment for MRSA infections. Performed at [facility], [place] [address] [place], [place] [address]   SARS Coronavirus 2 by RT PCR (hospital order, performed in [facility] hospital lab) Nasopharyngeal Nasopharyngeal Swab     Status: None   Collection Time: 01/19/20 10:30 AM   Specimen: Nasopharyngeal Swab  Result Value Ref Range Status   SARS Coronavirus 2 NEGATIVE NEGATIVE Final    Comment: (NOTE) SARS-CoV-2 target nucleic acids are NOT DETECTED.  The SARS-CoV-2 RNA is generally detectable in upper and lower respiratory specimens during the acute phase of infection. The lowest concentration of SARS-CoV-2 viral copies this assay can detect is 250 copies / mL. A negative result does not preclude SARS-CoV-2  infection and should not be used as the sole basis for treatment or other patient management decisions.  A negative result may occur with improper specimen collection / handling, submission of specimen other than nasopharyngeal swab, presence of viral mutation(s) within the areas targeted by this assay, and inadequate number of viral copies (<250 copies / mL). A negative result must be combined with clinical observations, patient history, and epidemiological information.  Fact Sheet for Patients:   StrictlyIdeas.no  Fact Sheet for Healthcare Providers: BankingDealers.co.za  This test is not yet approved or  cleared by the [place] FDA and has been authorized for detection and/or diagnosis of SARS-CoV-2 by FDA under an Emergency Use Authorization (EUA).  This EUA will remain in effect (meaning this test can be used) for the duration of the COVID-19 declaration under Section 564(b)(1) of the Act, 21 U.S.C. section 360bbb-3(b)(1), unless the authorization is terminated or revoked sooner.  Performed at [facility], [place] [address] [place], [place] [address]      Radiology Studies: No results found.   Marzetta Board, MD, PhD [facility]  Between 7 am - 7 pm I am available, please contact me via Amion or Securechat  Between 7 pm - 7 am I am not available, please contact night coverage MD/APP via Amion

## 2020-01-19 NOTE — Care Management Important Message (Signed)
Important Message  Patient Details IM Letter given to [place] Case Manager to present to the Patient Name: Hayden [name] MRN: [id number] Date of Birth: Oct 04, 1940   Medicare Important Message Given:  Yes     Kerin Salen 01/19/2020, 10:29 AM

## 2020-01-20 LAB — CBC
HCT: 34.9 % — ABNORMAL LOW (ref 39.0–52.0)
Hemoglobin: 11.9 g/dL — ABNORMAL LOW (ref 13.0–17.0)
MCH: 31 pg (ref 26.0–34.0)
MCHC: 34.1 g/dL (ref 30.0–36.0)
MCV: 90.9 fL (ref 80.0–100.0)
Platelets: 257 10*3/uL (ref 150–400)
RBC: 3.84 MIL/uL — ABNORMAL LOW (ref 4.22–5.81)
RDW: 12.5 % (ref 11.5–15.5)
WBC: 13.9 10*3/uL — ABNORMAL HIGH (ref 4.0–10.5)
nRBC: 0 % (ref 0.0–0.2)

## 2020-01-20 LAB — GLUCOSE, CAPILLARY
Glucose-Capillary: 205 mg/dL — ABNORMAL HIGH (ref 70–99)
Glucose-Capillary: 256 mg/dL — ABNORMAL HIGH (ref 70–99)

## 2020-01-20 NOTE — TOC Transition Note (Signed)
Transition of Care [facility]) - CM/SW Discharge Note   Patient Details  Name: Hayden [name] MRN: [id number] Date of Birth: 03-18-1941  [facility]) CM/SW Contact:  Lennart Pall, LCSW Phone Number: 01/20/2020, 2:47 PM   Clinical Narrative:   Pt has accepted SNF bed offer at [facility] and medically cleared for d/c today.  Have informed pt's sister as well at the ALF where he has been a resident and hopes to return following SNF rehab.  No further TOC needs.    Final next level of care: [place] Barriers to Discharge: Barriers Resolved   Patient Goals and CMS Choice Patient states their goals for this hospitalization and ongoing recovery are:: go back to [facility].gov Compare Post Acute Care list provided to:: Patient Choice offered to / list presented to : Patient, [facility] POA / [place]  Discharge Placement              Patient chooses bed at: [facility] Patient to be transferred to facility by: [place] Name of family member notified: sister, [name] Patient and family notified of of transfer: 01/20/20  Discharge Plan and Services In-house Referral: Clinical Social Work              DME Arranged: N/A DME Agency: NA       HH Arranged: NA [place] Agency: NA        Social Determinants of Health (SDOH) Interventions     Readmission Risk Interventions No flowsheet data found.

## 2020-01-20 NOTE — Progress Notes (Deleted)
PROGRESS NOTE  Hayden [name] XIP:[id number] DOB: 1941/01/30 DOA: 01/16/2020 PCP: Patient, No Pcp Per   LOS: 4 days   Brief Narrative / Interim history: 79 yo M with medical history significant of vascular demetia, DM2, prior right hip fracture with surgical repair admitted on 7/19 with left hip fracture after mechanical fall.  Orthopedic surgery was consulted status post lateral posterior approach hip hemiarthroplasty on 7/20. Now waiting on bed, PASSR # per SW  Subjective / 24h Interval events: -Doing well this morning, no complaints  Assessment & Plan: Principal Problem Left hip fracture-orthopedic surgery consulted, he is status post hemiarthroplasty on 7/20 by Dr. Percell Miller.  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 acetaminophen  500 mg Oral Q6H  . amLODipine  10 mg Oral Daily  . docusate sodium  100 mg Oral BID  . furosemide  20 mg Oral Daily  . insulin aspart  0-15 Units Subcutaneous TID WC  . insulin aspart  0-5 Units Subcutaneous QHS  . insulin detemir  10 Units Subcutaneous QHS  . pantoprazole  40 mg Oral Daily  . pravastatin  20 mg Oral q1800  . risperiDONE  1 mg Oral QHS  . rivaroxaban  20 mg Oral Daily  . sertraline  25 mg Oral Daily  . tamsulosin  0.4 mg Oral Daily   Continuous Infusions: . sodium chloride 75 mL/hr at 01/17/20 1751   PRN Meds:.bisacodyl, haloperidol, HYDROcodone-acetaminophen, HYDROcodone-acetaminophen, magnesium citrate, menthol-cetylpyridinium **OR** phenol, metoCLOPramide **OR**  metoCLOPramide (REGLAN) injection, morphine injection, ondansetron **OR** ondansetron (ZOFRAN) IV, polyethylene glycol, traZODone  Diet Orders (From admission, onward)    Start     Ordered   01/17/20 1750  Diet Carb Modified Fluid consistency: Thin; Room service appropriate? Yes  Diet effective now       Question Answer Comment  Diet-HS Snack? Nothing   Calorie Level Medium 1600-2000   Fluid consistency: Thin   Room service appropriate? Yes      01/17/20 1750          DVT prophylaxis: rivaroxaban (XARELTO) tablet 20 mg Start: 01/18/20 1130 SCDs Start: 01/17/20 1910 rivaroxaban (XARELTO) tablet 20 mg     Code Status: DNR  Family Communication: no family at bedside   Status is: Inpatient  Remains inpatient appropriate because:Inpatient level of care appropriate due to severity of illness  Dispo: The patient is from: SNF              Anticipated d/c is to: SNF              Anticipated d/c date is: 2 days              Patient currently is medically stable to d/c.  Consultants:  Orthopedic surgery   Procedures:  Hip fracture repair  Microbiology  None   Antimicrobials: none    Objective: Vitals:   01/19/20 1505 01/19/20 2136 01/20/20 0535 01/20/20 0801  BP: (!) 136/66 (!) 125/60 (!) 147/61   Pulse: 105 87 87   Resp: 17 16 14    Temp: 99.3 F (37.4 C) 98.6 F (37  C) 99.2 F (37.3 C)   TempSrc:  Oral Oral   SpO2: 98% 100% 96%   Height:    5\' 8"  (1.727 m)    Intake/Output Summary (Last 24 hours) at 01/20/2020 1401 Last data filed at 01/20/2020 0600 Gross per 24 hour  Intake 280 ml  Output 2750 ml  Net -2470 ml   There were no vitals filed for this visit.  Examination:  Constitutional: No distress, in bed Respiratory: CTA Cardiovascular: RRR  Data Reviewed: I have independently reviewed following labs and imaging studies   CBC: Recent Labs  Lab 01/16/20 0630 01/17/20 0351 01/18/20 0325 01/19/20 0329 01/20/20 0311  WBC 16.1* 12.0* 12.1* 10.8*  13.9*  NEUTROABS 9.3*  --   --   --   --   HGB 13.1 11.9* 11.2* 10.8* 11.9*  HCT 38.7* 37.9* 33.2* 33.3* 34.9*  MCV 93.3 96.9 91.2 95.7 90.9  PLT 239 188 195 209 094   Basic Metabolic Panel: Recent Labs  Lab 01/16/20 0630 01/17/20 0351 01/18/20 0325 01/19/20 0329  NA 139 134* 136 140  K 3.4* 3.9 4.1 3.7  CL 105 105 103 105  CO2 24 22 24 25   GLUCOSE 191* 206* 217* 198*  BUN 16 15 13 14   CREATININE 0.69 0.56* 0.60* 0.71  CALCIUM 8.9 8.6* 8.9 8.7*   Liver Function Tests: No results for input(s): AST, ALT, ALKPHOS, BILITOT, PROT, ALBUMIN in the last 168 hours. Coagulation Profile: Recent Labs  Lab 01/16/20 0630  INR 1.3*   HbA1C: No results for input(s): HGBA1C in the last 72 hours. CBG: Recent Labs  Lab 01/19/20 1225 01/19/20 1707 01/19/20 2200 01/20/20 0742 01/20/20 1228  GLUCAP 215* 276* 223* 205* 256*    Recent Results (from the past 240 hour(s))  SARS Coronavirus 2 by RT PCR (hospital order, performed in [facility] hospital lab) Nasopharyngeal Nasopharyngeal Swab     Status: None   Collection Time: 01/16/20  6:39 AM   Specimen: Nasopharyngeal Swab  Result Value Ref Range Status   SARS Coronavirus 2 NEGATIVE NEGATIVE Final    Comment: (NOTE) SARS-CoV-2 target nucleic acids are NOT DETECTED.  The SARS-CoV-2 RNA is generally detectable in upper and lower respiratory specimens during the acute phase of infection. The lowest concentration of SARS-CoV-2 viral copies this assay can detect is 250 copies / mL. A negative result does not preclude SARS-CoV-2 infection and should not be used as the sole basis for treatment or other patient management decisions.  A negative result may occur with improper specimen collection / handling, submission of specimen other than nasopharyngeal swab, presence of viral mutation(s) within the areas targeted by this assay, and inadequate number of viral copies (<250 copies / mL). A negative result must be combined with  clinical observations, patient history, and epidemiological information.  Fact Sheet for Patients:   StrictlyIdeas.no  Fact Sheet for Healthcare Providers: BankingDealers.co.za  This test is not yet approved or  cleared by the [place] FDA and has been authorized for detection and/or diagnosis of SARS-CoV-2 by FDA under an Emergency Use Authorization (EUA).  This EUA will remain in effect (meaning this test can be used) for the duration of the COVID-19 declaration under Section 564(b)(1) of the Act, 21 U.S.C. section 360bbb-3(b)(1), unless the authorization is terminated or revoked sooner.  Performed at [facility], [place] [address], [place], [place] [address]   MRSA PCR Screening     Status: None   Collection Time: 01/16/20 11:25 PM  Specimen: Nasal Mucosa; Nasopharyngeal  Result Value Ref Range Status   MRSA by PCR NEGATIVE NEGATIVE Final    Comment:        The GeneXpert MRSA Assay (FDA approved for NASAL specimens only), is one component of a comprehensive MRSA colonization surveillance program. It is not intended to diagnose MRSA infection nor to guide or monitor treatment for MRSA infections. Performed at [facility], [place] [address], [place], [place] [address]   SARS Coronavirus 2 by RT PCR (hospital order, performed in [name] hospital lab) Nasopharyngeal Nasopharyngeal Swab     Status: None   Collection Time: 01/19/20 10:30 AM   Specimen: Nasopharyngeal Swab  Result Value Ref Range Status   SARS Coronavirus 2 NEGATIVE NEGATIVE Final    Comment: (NOTE) SARS-CoV-2 target nucleic acids are NOT DETECTED.  The SARS-CoV-2 RNA is generally detectable in upper and lower respiratory specimens during the acute phase of infection. The lowest concentration of SARS-CoV-2 viral copies this assay can detect is 250 copies / mL. A negative result does not preclude SARS-CoV-2  infection and should not be used as the sole basis for treatment or other patient management decisions.  A negative result may occur with improper specimen collection / handling, submission of specimen other than nasopharyngeal swab, presence of viral mutation(s) within the areas targeted by this assay, and inadequate number of viral copies (<250 copies / mL). A negative result must be combined with clinical observations, patient history, and epidemiological information.  Fact Sheet for Patients:   StrictlyIdeas.no  Fact Sheet for Healthcare Providers: BankingDealers.co.za  This test is not yet approved or  cleared by the [place] FDA and has been authorized for detection and/or diagnosis of SARS-CoV-2 by FDA under an Emergency Use Authorization (EUA).  This EUA will remain in effect (meaning this test can be used) for the duration of the COVID-19 declaration under Section 564(b)(1) of the Act, 21 U.S.C. section 360bbb-3(b)(1), unless the authorization is terminated or revoked sooner.  Performed at [facility], [place] [address], [place], [place] [address]      Radiology Studies: No results found.   Marzetta Board, MD, PhD [facility]  Between 7 am - 7 pm I am available, please contact me via Amion or Securechat  Between 7 pm - 7 am I am not available, please contact night coverage MD/APP via Amion

## 2020-01-30 ENCOUNTER — Ambulatory Visit: Payer: Medicare Other | Admitting: Neurology

## 2020-02-21 ENCOUNTER — Other Ambulatory Visit (HOSPITAL_COMMUNITY): Payer: Self-pay | Admitting: Family Medicine

## 2020-02-21 ENCOUNTER — Telehealth (HOSPITAL_COMMUNITY): Payer: Self-pay

## 2020-02-21 DIAGNOSIS — Z452 Encounter for adjustment and management of vascular access device: Secondary | ICD-10-CM

## 2020-02-21 NOTE — Telephone Encounter (Signed)
Called Tammy at [facility] [place] to schedule picc placement, no answer. Will try back later. AW

## 2020-04-26 ENCOUNTER — Other Ambulatory Visit: Payer: Self-pay

## 2020-04-26 ENCOUNTER — Emergency Department (HOSPITAL_COMMUNITY): Payer: Medicare Other

## 2020-04-26 ENCOUNTER — Inpatient Hospital Stay (HOSPITAL_COMMUNITY)
Admission: EM | Admit: 2020-04-26 | Discharge: 2020-04-30 | DRG: 871 | Disposition: A | Discharge status: Hospice | Payer: Medicare Other | Attending: Internal Medicine | Admitting: Internal Medicine

## 2020-04-26 DIAGNOSIS — Z7901 Long term (current) use of anticoagulants: Secondary | ICD-10-CM | POA: Diagnosis not present

## 2020-04-26 DIAGNOSIS — L89614 Pressure ulcer of right heel, stage 4: Secondary | ICD-10-CM | POA: Diagnosis present

## 2020-04-26 DIAGNOSIS — K219 Gastro-esophageal reflux disease without esophagitis: Secondary | ICD-10-CM | POA: Diagnosis present

## 2020-04-26 DIAGNOSIS — I69391 Dysphagia following cerebral infarction: Secondary | ICD-10-CM

## 2020-04-26 DIAGNOSIS — B356 Tinea cruris: Secondary | ICD-10-CM | POA: Diagnosis present

## 2020-04-26 DIAGNOSIS — Z981 Arthrodesis status: Secondary | ICD-10-CM

## 2020-04-26 DIAGNOSIS — R1310 Dysphagia, unspecified: Secondary | ICD-10-CM | POA: Diagnosis present

## 2020-04-26 DIAGNOSIS — E1169 Type 2 diabetes mellitus with other specified complication: Secondary | ICD-10-CM | POA: Diagnosis present

## 2020-04-26 DIAGNOSIS — Z66 Do not resuscitate: Secondary | ICD-10-CM | POA: Diagnosis present

## 2020-04-26 DIAGNOSIS — E785 Hyperlipidemia, unspecified: Secondary | ICD-10-CM | POA: Diagnosis present

## 2020-04-26 DIAGNOSIS — N400 Enlarged prostate without lower urinary tract symptoms: Secondary | ICD-10-CM | POA: Diagnosis present

## 2020-04-26 DIAGNOSIS — E119 Type 2 diabetes mellitus without complications: Secondary | ICD-10-CM

## 2020-04-26 DIAGNOSIS — Z856 Personal history of leukemia: Secondary | ICD-10-CM | POA: Diagnosis not present

## 2020-04-26 DIAGNOSIS — Z79899 Other long term (current) drug therapy: Secondary | ICD-10-CM

## 2020-04-26 DIAGNOSIS — A419 Sepsis, unspecified organism: Secondary | ICD-10-CM | POA: Diagnosis present

## 2020-04-26 DIAGNOSIS — Z86711 Personal history of pulmonary embolism: Secondary | ICD-10-CM

## 2020-04-26 DIAGNOSIS — Z91013 Allergy to seafood: Secondary | ICD-10-CM

## 2020-04-26 DIAGNOSIS — L89154 Pressure ulcer of sacral region, stage 4: Secondary | ICD-10-CM | POA: Diagnosis present

## 2020-04-26 DIAGNOSIS — Z8673 Personal history of transient ischemic attack (TIA), and cerebral infarction without residual deficits: Secondary | ICD-10-CM | POA: Diagnosis not present

## 2020-04-26 DIAGNOSIS — Z794 Long term (current) use of insulin: Secondary | ICD-10-CM

## 2020-04-26 DIAGNOSIS — Z20822 Contact with and (suspected) exposure to covid-19: Secondary | ICD-10-CM | POA: Diagnosis present

## 2020-04-26 DIAGNOSIS — F329 Major depressive disorder, single episode, unspecified: Secondary | ICD-10-CM | POA: Diagnosis present

## 2020-04-26 DIAGNOSIS — M797 Fibromyalgia: Secondary | ICD-10-CM | POA: Diagnosis present

## 2020-04-26 DIAGNOSIS — M86071 Acute hematogenous osteomyelitis, right ankle and foot: Secondary | ICD-10-CM | POA: Diagnosis present

## 2020-04-26 DIAGNOSIS — N179 Acute kidney failure, unspecified: Secondary | ICD-10-CM | POA: Diagnosis present

## 2020-04-26 DIAGNOSIS — Z515 Encounter for palliative care: Secondary | ICD-10-CM | POA: Diagnosis not present

## 2020-04-26 DIAGNOSIS — E1151 Type 2 diabetes mellitus with diabetic peripheral angiopathy without gangrene: Secondary | ICD-10-CM | POA: Diagnosis present

## 2020-04-26 DIAGNOSIS — L89620 Pressure ulcer of left heel, unstageable: Secondary | ICD-10-CM | POA: Diagnosis present

## 2020-04-26 DIAGNOSIS — I10 Essential (primary) hypertension: Secondary | ICD-10-CM | POA: Diagnosis present

## 2020-04-26 DIAGNOSIS — N485 Ulcer of penis: Secondary | ICD-10-CM | POA: Diagnosis present

## 2020-04-26 DIAGNOSIS — D6489 Other specified anemias: Secondary | ICD-10-CM | POA: Diagnosis present

## 2020-04-26 DIAGNOSIS — L8990 Pressure ulcer of unspecified site, unspecified stage: Secondary | ICD-10-CM | POA: Diagnosis present

## 2020-04-26 DIAGNOSIS — Z96642 Presence of left artificial hip joint: Secondary | ICD-10-CM | POA: Diagnosis present

## 2020-04-26 DIAGNOSIS — R6520 Severe sepsis without septic shock: Secondary | ICD-10-CM | POA: Diagnosis present

## 2020-04-26 DIAGNOSIS — Z5189 Encounter for other specified aftercare: Secondary | ICD-10-CM

## 2020-04-26 DIAGNOSIS — Z7401 Bed confinement status: Secondary | ICD-10-CM

## 2020-04-26 LAB — COMPREHENSIVE METABOLIC PANEL
ALT: 66 U/L — ABNORMAL HIGH (ref 0–44)
AST: 37 U/L (ref 15–41)
Albumin: 2.3 g/dL — ABNORMAL LOW (ref 3.5–5.0)
Alkaline Phosphatase: 77 U/L (ref 38–126)
Anion gap: 10 (ref 5–15)
BUN: 32 mg/dL — ABNORMAL HIGH (ref 8–23)
CO2: 26 mmol/L (ref 22–32)
Calcium: 8.5 mg/dL — ABNORMAL LOW (ref 8.9–10.3)
Chloride: 96 mmol/L — ABNORMAL LOW (ref 98–111)
Creatinine, Ser: 1.25 mg/dL — ABNORMAL HIGH (ref 0.61–1.24)
GFR, Estimated: 58 mL/min — ABNORMAL LOW (ref >60)
Glucose, Bld: 315 mg/dL — ABNORMAL HIGH (ref 70–99)
Potassium: 4 mmol/L (ref 3.5–5.1)
Sodium: 132 mmol/L — ABNORMAL LOW (ref 135–145)
Total Bilirubin: 0.4 mg/dL (ref 0.3–1.2)
Total Protein: 5.4 g/dL — ABNORMAL LOW (ref 6.5–8.1)

## 2020-04-26 LAB — APTT: aPTT: 47 seconds — ABNORMAL HIGH (ref 24–36)

## 2020-04-26 LAB — RESPIRATORY PANEL BY RT PCR (FLU A&B, COVID)
Influenza A by PCR: NEGATIVE
Influenza B by PCR: NEGATIVE
SARS Coronavirus 2 by RT PCR: NEGATIVE

## 2020-04-26 LAB — CBC WITH DIFFERENTIAL/PLATELET
Abs Immature Granulocytes: 0.26 10*3/uL — ABNORMAL HIGH (ref 0.00–0.07)
Basophils Absolute: 0.1 10*3/uL (ref 0.0–0.1)
Basophils Relative: 0 %
Eosinophils Absolute: 1.2 10*3/uL — ABNORMAL HIGH (ref 0.0–0.5)
Eosinophils Relative: 5 %
HCT: 29.8 % — ABNORMAL LOW (ref 39.0–52.0)
Hemoglobin: 8.9 g/dL — ABNORMAL LOW (ref 13.0–17.0)
Immature Granulocytes: 1 %
Lymphocytes Relative: 43 %
Lymphs Abs: 10.3 10*3/uL — ABNORMAL HIGH (ref 0.7–4.0)
MCH: 26.4 pg (ref 26.0–34.0)
MCHC: 29.9 g/dL — ABNORMAL LOW (ref 30.0–36.0)
MCV: 88.4 fL (ref 80.0–100.0)
Monocytes Absolute: 0.4 10*3/uL (ref 0.1–1.0)
Monocytes Relative: 2 %
Neutro Abs: 11.5 10*3/uL — ABNORMAL HIGH (ref 1.7–7.7)
Neutrophils Relative %: 49 %
Platelets: 365 10*3/uL (ref 150–400)
RBC: 3.37 MIL/uL — ABNORMAL LOW (ref 4.22–5.81)
RDW: 15 % (ref 11.5–15.5)
WBC: 23.6 10*3/uL — ABNORMAL HIGH (ref 4.0–10.5)
nRBC: 0 % (ref 0.0–0.2)

## 2020-04-26 LAB — CBG MONITORING, ED: Glucose-Capillary: 293 mg/dL — ABNORMAL HIGH (ref 70–99)

## 2020-04-26 LAB — GLUCOSE, CAPILLARY: Glucose-Capillary: 100 mg/dL — ABNORMAL HIGH (ref 70–99)

## 2020-04-26 LAB — LACTIC ACID, PLASMA
Lactic Acid, Venous: 2.6 mmol/L (ref 0.5–1.9)
Lactic Acid, Venous: 2.5 mmol/L (ref 0.5–1.9)

## 2020-04-26 LAB — C-REACTIVE PROTEIN: CRP: 14.4 mg/dL — ABNORMAL HIGH (ref <1.0)

## 2020-04-26 LAB — PROTIME-INR
INR: 3.4 — ABNORMAL HIGH (ref 0.8–1.2)
Prothrombin Time: 33 seconds — ABNORMAL HIGH (ref 11.4–15.2)

## 2020-04-26 LAB — SEDIMENTATION RATE: Sed Rate: 75 mm/hr — ABNORMAL HIGH (ref 0–16)

## 2020-04-26 MED ORDER — INSULIN ASPART 100 UNIT/ML ~~LOC~~ SOLN: 0.0000 [IU] | Freq: Every day | SUBCUTANEOUS | Status: DC

## 2020-04-26 MED ORDER — METRONIDAZOLE IN NACL 5-0.79 MG/ML-% IV SOLN
500.0000 mg | Freq: Once | INTRAVENOUS | Status: AC
  Administered 2020-04-26: 500 mg via INTRAVENOUS
  Filled 2020-04-26: qty 100

## 2020-04-26 MED ORDER — OXYCODONE HCL 5 MG PO TABS
5.0000 mg | ORAL_TABLET | Freq: Two times a day (BID) | ORAL | Status: DC | PRN
  Administered 2020-04-27: 5 mg via ORAL
  Filled 2020-04-26: qty 1

## 2020-04-26 MED ORDER — LACTATED RINGERS IV BOLUS (SEPSIS)
1000.0000 mL | Freq: Once | INTRAVENOUS | Status: AC
  Administered 2020-04-26: 1000 mL via INTRAVENOUS

## 2020-04-26 MED ORDER — SODIUM CHLORIDE 0.9 % IV SOLN
2.0000 g | Freq: Two times a day (BID) | INTRAVENOUS | Status: DC
  Administered 2020-04-27 – 2020-04-29 (×5): 2 g via INTRAVENOUS
  Filled 2020-04-26 (×8): qty 2

## 2020-04-26 MED ORDER — TAMSULOSIN HCL 0.4 MG PO CAPS
0.4000 mg | ORAL_CAPSULE | Freq: Every day | ORAL | Status: DC
  Administered 2020-04-27 – 2020-04-30 (×4): 0.4 mg via ORAL
  Filled 2020-04-26 (×4): qty 1

## 2020-04-26 MED ORDER — SERTRALINE HCL 50 MG PO TABS
50.0000 mg | ORAL_TABLET | Freq: Every day | ORAL | Status: DC
  Administered 2020-04-27 – 2020-04-30 (×4): 50 mg via ORAL
  Filled 2020-04-26 (×4): qty 1

## 2020-04-26 MED ORDER — RISPERIDONE 1 MG PO TABS
1.0000 mg | ORAL_TABLET | Freq: Two times a day (BID) | ORAL | Status: DC
  Administered 2020-04-26 – 2020-04-30 (×8): 1 mg via ORAL
  Filled 2020-04-26 (×9): qty 1

## 2020-04-26 MED ORDER — ONDANSETRON HCL 4 MG PO TABS: 4.0000 mg | ORAL_TABLET | Freq: Four times a day (QID) | ORAL | Status: DC | PRN

## 2020-04-26 MED ORDER — VANCOMYCIN HCL IN DEXTROSE 1-5 GM/200ML-% IV SOLN: 1000.0000 mg | Freq: Once | INTRAVENOUS | Status: DC

## 2020-04-26 MED ORDER — ACETAMINOPHEN 325 MG PO TABS
650.0000 mg | ORAL_TABLET | Freq: Four times a day (QID) | ORAL | Status: DC | PRN
  Administered 2020-04-27 – 2020-04-30 (×5): 650 mg via ORAL
  Filled 2020-04-26 (×5): qty 2

## 2020-04-26 MED ORDER — PRAVASTATIN SODIUM 10 MG PO TABS
20.0000 mg | ORAL_TABLET | Freq: Every day | ORAL | Status: DC
  Administered 2020-04-27 – 2020-04-28 (×2): 20 mg via ORAL
  Filled 2020-04-26 (×2): qty 2

## 2020-04-26 MED ORDER — VANCOMYCIN HCL 1500 MG/300ML IV SOLN
1500.0000 mg | Freq: Once | INTRAVENOUS | Status: AC
  Administered 2020-04-26: 1500 mg via INTRAVENOUS
  Filled 2020-04-26: qty 300

## 2020-04-26 MED ORDER — NYSTATIN 100000 UNIT/GM EX POWD
Freq: Three times a day (TID) | CUTANEOUS | Status: DC
  Filled 2020-04-26: qty 15

## 2020-04-26 MED ORDER — POLYETHYLENE GLYCOL 3350 17 G PO PACK: 17.0000 g | PACK | Freq: Every day | ORAL | Status: DC | PRN

## 2020-04-26 MED ORDER — ZINC OXIDE 40 % EX OINT
1.0000 "application " | TOPICAL_OINTMENT | Freq: Two times a day (BID) | CUTANEOUS | Status: DC | PRN
  Filled 2020-04-26: qty 57

## 2020-04-26 MED ORDER — INSULIN ASPART 100 UNIT/ML ~~LOC~~ SOLN
6.0000 [IU] | Freq: Three times a day (TID) | SUBCUTANEOUS | Status: DC
  Administered 2020-04-27: 6 [IU] via SUBCUTANEOUS

## 2020-04-26 MED ORDER — GUAIFENESIN 100 MG/5ML PO SOLN
200.0000 mg | Freq: Four times a day (QID) | ORAL | Status: DC | PRN
  Filled 2020-04-26: qty 10

## 2020-04-26 MED ORDER — ACETAMINOPHEN 650 MG RE SUPP
650.0000 mg | Freq: Four times a day (QID) | RECTAL | Status: DC | PRN
  Administered 2020-04-30: 650 mg via RECTAL
  Filled 2020-04-26: qty 1

## 2020-04-26 MED ORDER — VANCOMYCIN HCL 500 MG/100ML IV SOLN
500.0000 mg | Freq: Two times a day (BID) | INTRAVENOUS | Status: DC
  Administered 2020-04-27 – 2020-04-29 (×6): 500 mg via INTRAVENOUS
  Filled 2020-04-26 (×6): qty 100

## 2020-04-26 MED ORDER — LACTATED RINGERS IV SOLN: INTRAVENOUS | Status: AC

## 2020-04-26 MED ORDER — INSULIN ASPART 100 UNIT/ML ~~LOC~~ SOLN: 0.0000 [IU] | Freq: Three times a day (TID) | SUBCUTANEOUS | Status: DC

## 2020-04-26 MED ORDER — INSULIN DETEMIR 100 UNIT/ML ~~LOC~~ SOLN
24.0000 [IU] | Freq: Every day | SUBCUTANEOUS | Status: DC
  Administered 2020-04-27: 24 [IU] via SUBCUTANEOUS
  Filled 2020-04-26: qty 0.24

## 2020-04-26 MED ORDER — SODIUM CHLORIDE 0.9 % IV SOLN
2.0000 g | Freq: Once | INTRAVENOUS | Status: AC
  Administered 2020-04-26: 2 g via INTRAVENOUS
  Filled 2020-04-26 (×2): qty 2

## 2020-04-26 MED ORDER — ONDANSETRON HCL 4 MG/2ML IJ SOLN: 4.0000 mg | Freq: Four times a day (QID) | INTRAMUSCULAR | Status: DC | PRN

## 2020-04-26 MED ORDER — CALAMINE EX LOTN
TOPICAL_LOTION | CUTANEOUS | Status: DC | PRN
  Filled 2020-04-26: qty 177

## 2020-04-26 MED ORDER — RIVAROXABAN 20 MG PO TABS
20.0000 mg | ORAL_TABLET | Freq: Every day | ORAL | Status: DC
  Administered 2020-04-27 – 2020-04-29 (×3): 20 mg via ORAL
  Filled 2020-04-26 (×4): qty 1

## 2020-04-26 MED ORDER — FLUCONAZOLE 150 MG PO TABS
150.0000 mg | ORAL_TABLET | Freq: Every day | ORAL | Status: DC
  Administered 2020-04-26 – 2020-04-27 (×2): 150 mg via ORAL
  Filled 2020-04-26 (×3): qty 1

## 2020-04-26 MED ORDER — PANTOPRAZOLE SODIUM 40 MG PO TBEC
40.0000 mg | DELAYED_RELEASE_TABLET | Freq: Every day | ORAL | Status: DC
  Administered 2020-04-27 – 2020-04-29 (×3): 40 mg via ORAL
  Filled 2020-04-26 (×3): qty 1

## 2020-04-26 NOTE — ED Notes (Signed)
Attempted to call report x 1  

## 2020-04-26 NOTE — ED Notes (Signed)
Pt with multiple pressure wounds involving bilateral heels, left hip, external right knee, and scrotum. Pressure dressing applied to right knee, left hip dressing reinforced, new dressing placed to bilateral heels. Pt with MASD to perineum area. Placed on condom cath. Repositioned onto back.

## 2020-04-26 NOTE — ED Provider Notes (Addendum)
[facility] EMERGENCY DEPARTMENT Provider Note   CSN: [id number] Arrival date & time:        History No chief complaint on file.   Hayden [name] is a 79 y.o. male.  Who presents from facility for concern of new leukocytosis.  According to facility paperwork patient with white count of 23 today. According to facility, patient is nonverbal at baseline, but has developed a new rash on his neck, arms, back, upper legs.  Per EMS report he is chronically on vancomycin, has a PICC line in place in his right arm, however per facility he has not received a single medicine in more than 1 week.  It is unclear why.  Patient with history of CLL, multiple decubitus ulcers on his sacrum, right hip, heels bilaterally.  I personally viewed the patient's medical records.  He also has history of type 2 diabetes, acute renal failure, dementia, PEs on chronic anticoagulation with Xarelto, multiple hip fractures.  Patient presents from facility with paperwork clearly outlining DNR.  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   Patient attempts to grab and or grope male patients   Leukemia [facility]) chronic lymphocytic   2008 diagnosed-monitored Dr     Patient Active Problem List   Diagnosis Date Noted   Closed right hip fracture ([place]) 01/16/2020   Left displaced femoral neck fracture ([place]) 01/16/2020   DNR (do not resuscitate) 01/16/2020   Right lower lobe pneumonia 12/04/2019   Hospice care patient 05/19/2019   Acute pulmonary embolus ([place]) 05/14/2019   Acute pulmonary embolism ([place]) 05/14/2019   Pneumonia due to COVID-19 virus    Aspiration pneumonitis ([place]) 05/13/2019   Closed fracture of right hip ([place])    Femur fracture, right ([place]) 02/10/2019    Fall    Acute encephalopathy 07/02/2018   Dementia without behavioral disturbance ([place]) 05/24/2018   Pressure injury of skin 05/23/2018   Cellulitis 04/17/2018   Cellulitis, scrotum 03/09/2018   Cellulitis of male genitalia 03/08/2018   Depression 03/08/2018   BPH (benign prostatic hyperplasia) 03/08/2018   Leukocytosis 03/08/2018   Mild dementia ([place]) 10/23/2017   Lower urinary tract infectious disease 09/06/2017   Hyperglycemia 09/06/2017   ARF (acute renal failure) ([place]) 09/06/2017   Altered mental status 09/06/2017   Type 2 diabetes mellitus without complication, with long-term current use of insulin ([place]) 04/02/2017   Cubital tunnel syndrome on right 12/23/2016   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   Bilateral hand pain 12/09/2016   CLL (chronic lymphocytic leukemia) ([place]) 09/01/2013    Past Surgical History:  Procedure Laterality Date   ADENOIDECTOMY     CARPAL TUNNEL RELEASE     rt hand   CATARACT EXTRACTION, BILATERAL     CERVICAL FUSION  1998   CYSTOSCOPY  07/26/2012   Procedure: CYSTOSCOPY;  Surgeon: Ailene Rud, MD;  [facility];  Service: Urology;  Laterality: N/A;  DIAGNOSTIC CYSTO PROSTATE ULTRASOUND       FEMUR IM NAIL Right 02/10/2019   Procedure: INTRAMEDULLARY (IM) NAIL FEMORAL;  Surgeon: Hiram Gash, MD;  Location: [facility];  Service: Orthopedics;  Laterality: Right;   HIP ARTHROPLASTY Left 01/17/2020   Procedure: ARTHROPLASTY LATERAL POSTERIOR APPROACH HIP (HEMIARTHROPLASTY);  Surgeon: Renette Butters, MD;  Location: [facility];  Service: Orthopedics;  Laterality: Left;   PROSTATE BIOPSY  07/26/2012   Procedure: PROSTATE BIOPSY;  Surgeon: Ailene Rud, MD;  [facility];  Service: Urology;  Laterality: N/A;   retinal micro aneurysms     tonsil     VASECTOMY     VITRECTOMY         Family History  Problem Relation Age of Onset   Diabetes Father     Diabetes Sister    Diabetes Brother     Social History   Tobacco Use   Smoking status: Never Smoker   Smokeless tobacco: Never Used  Scientific laboratory technician Use: Never used  Substance Use Topics   Alcohol use: No   Drug use: No    Home Medications Prior to Admission medications   Medication Sig Start Date End Date Taking? Authorizing Provider  diphenhydrAMINE (BENADRYL) 25 MG tablet Take 25 mg by mouth every 8 (eight) hours. 3 Day Course   Yes [provider]  guaiFENesin (ROBITUSSIN) 100 MG/5ML liquid Take 200 mg by mouth every 6 (six) hours as needed for cough.   Yes [provider]  insulin lispro (HUMALOG) 100 UNIT/ML KwikPen Inject 6 Units into the skin 3 (three) times daily with meals. 10/11/18  Yes [provider]  LEVEMIR FLEXTOUCH 100 UNIT/ML Pen Inject 24 Units into the skin at bedtime. Patient taking differently: Inject 24 Units into the skin daily.  02/16/19  Yes Eugenie Filler, MD  loperamide (IMODIUM) 2 MG capsule Take 2 mg by mouth See admin instructions. Take 2 mg by mouth with each loose stool as needed for diarrhea (do not exceed 8 doses in 24 hours)   Yes [provider]  lovastatin (MEVACOR) 20 MG tablet Take 20 mg by mouth at bedtime.    Yes [provider]  magnesium hydroxide (MILK OF MAGNESIA) 400 MG/5ML suspension Take 30 mLs by mouth at bedtime as needed for mild constipation.   Yes [provider]  neomycin-bacitracin-polymyxin (NEOSPORIN) [phone] ointment Apply 1 application topically See admin instructions. *Standing order for skin tear, abrasions* Clean area with normal saline. Apply tao, cover with bandaid or gauze and tape. Change as needed until healed.   Yes [provider]  nystatin cream (MYCOSTATIN) Apply 1 application topically every 12 (twelve) hours. 14 Days   Yes [provider]  omeprazole (PRILOSEC) 20 MG capsule Take 1 capsule (20 mg total) by mouth daily. 05/11/19   Yes Plunkett, Loree Fee, MD  oxyCODONE (OXY IR/ROXICODONE) 5 MG immediate release tablet Take 0.5-1 pills every 6 hrs as needed for pain Patient taking differently: Take 5 mg by mouth every 12 (twelve) hours as needed for severe pain.  01/18/20  Yes McBane, Maylene Roes, PA-C  Pollen Extracts (PROSTAT PO) Take 30 mLs by mouth in the morning, at noon, and at bedtime.   Yes [provider]  risperiDONE (RISPERDAL) 1 MG tablet Take 1 tablet (1 mg total) by mouth in the morning and at bedtime. 01/19/20  Yes Gherghe, Vella Redhead, MD  rivaroxaban (XARELTO) 20 MG TABS tablet Take 1 tablet (20 mg total) by mouth daily with supper. Patient will be on Xarelto loading dose until 06/07/2019, then he will be transitioned to this dose Xarelto 20 mg p.o. once daily on 06/08/2019 Patient taking differently: Take 20 mg by mouth daily.  06/08/19  Yes Elgergawy, Silver Huguenin, MD  sertraline (ZOLOFT) 25 MG tablet  Take 50 mg by mouth daily.    Yes [provider]  Skin Protectants, Misc. (BAZA PROTECT EX) Apply 1 application topically as needed (incontinence episode). Buttocks and scrotum   Yes [provider]  tamsulosin (FLOMAX) 0.4 MG CAPS capsule Take 0.4 mg by mouth daily.    Yes [provider]  acetaminophen (TYLENOL) 325 MG tablet Take 2 tablets (650 mg total) by mouth every 6 (six) hours as needed for mild pain (or Fever >/= 101). Patient not taking: Reported on 04/26/2020 05/19/19   Elgergawy, Silver Huguenin, MD  amLODipine (NORVASC) 10 MG tablet Take 1 tablet (10 mg total) by mouth daily. Patient not taking: Reported on 04/26/2020 05/20/19   Elgergawy, Silver Huguenin, MD  donepezil (ARICEPT) 10 MG tablet Take 1 tablet (10 mg total) by mouth at bedtime. Patient not taking: Reported on 04/26/2020 05/03/18   Cameron Sprang, MD  furosemide (LASIX) 20 MG tablet Take 1 tablet (20 mg total) by mouth daily. Patient not taking: Reported on 04/26/2020 12/03/19   Drenda Freeze, MD  haloperidol (HALDOL) 5 MG  tablet Take 1 tablet (5 mg total) by mouth every 8 (eight) hours as needed for agitation. Patient not taking: Reported on 04/26/2020 01/19/20   Caren Griffins, MD  [employer] [facility]) CREA Apply 1 application topically in the morning and at bedtime. Buttocks    [provider]  calcium citrate-vitamin D (CITRACAL+D) 315-200 MG-UNIT per tablet Take 1 tablet by mouth daily.  09/04/11  [provider]  terazosin (HYTRIN) 5 MG capsule Take 5 mg by mouth at bedtime.  09/04/11  [provider]    Allergies    Bee venom, Glucophage [metformin], Shellfish allergy, Grass extracts [gramineae pollens], and Tobacco [tobacco]  Review of Systems   Review of Systems  Unable to perform ROS: Patient nonverbal    Physical Exam Updated Vital Signs BP 114/62    Pulse 96    Temp 97.8 F (36.6 C) (Oral)    Wt 63.5 kg    SpO2 100%    BMI 21.29 kg/m   Physical Exam Vitals and nursing note reviewed.  HENT:     Head: Normocephalic and atraumatic.     Nose: Nose normal.     Mouth/Throat:     Mouth: Mucous membranes are moist. No oral lesions.     Dentition: Dental caries present.     Tongue: No lesions. Tongue does not deviate from midline.     Pharynx: No oropharyngeal exudate or posterior oropharyngeal erythema.  Eyes:     General:        Right eye: No discharge.        Left eye: No discharge.     Conjunctiva/sclera: Conjunctivae normal.     Pupils: Pupils are equal, round, and reactive to light.  Cardiovascular:     Rate and Rhythm: Normal rate and regular rhythm.     Pulses: Normal pulses.     Heart sounds: Normal heart sounds.  Pulmonary:     Effort: Pulmonary effort is normal. No respiratory distress.     Breath sounds: Normal breath sounds. Decreased air movement present. No wheezing or rales.  Abdominal:     General: There is no distension.     Palpations: Abdomen is soft.     Tenderness: There is no abdominal tenderness.  Musculoskeletal:         General: No deformity.     Cervical back: Neck supple. No tenderness.  Feet:  Feet:     Comments: Multiple decubitus ulcers in various toes Skin:    General: Skin is warm and dry.     Capillary Refill: Capillary refill takes 2 to 3 seconds.          Comments: Diffuse erythematous lacy rash across arms, neck, trunk, proximal thighs.  Neurological:     Mental Status: He is alert. Mental status is at baseline.  Psychiatric:        Mood and Affect: Mood normal.     ED Results / Procedures / Treatments   Labs (all labs ordered are listed, but only abnormal results are displayed) Labs Reviewed  LACTIC ACID, PLASMA - Abnormal; Notable for the following components:      Result Value   Lactic Acid, Venous 2.6 (*)    All other components within normal limits  LACTIC ACID, PLASMA - Abnormal; Notable for the following components:   Lactic Acid, Venous 2.5 (*)    All other components within normal limits  COMPREHENSIVE METABOLIC PANEL - Abnormal; Notable for the following components:   Sodium 132 (*)    Chloride 96 (*)    Glucose, Bld 315 (*)    BUN 32 (*)    Creatinine, Ser 1.25 (*)    Calcium 8.5 (*)    Total Protein 5.4 (*)    Albumin 2.3 (*)    ALT 66 (*)    GFR, Estimated 58 (*)    All other components within normal limits  CBC WITH DIFFERENTIAL/PLATELET - Abnormal; Notable for the following components:   WBC 23.6 (*)    RBC 3.37 (*)    Hemoglobin 8.9 (*)    HCT 29.8 (*)    MCHC 29.9 (*)    Neutro Abs 11.5 (*)    Lymphs Abs 10.3 (*)    Eosinophils Absolute 1.2 (*)    Abs Immature Granulocytes 0.26 (*)    All other components within normal limits  PROTIME-INR - Abnormal; Notable for the following components:   Prothrombin Time 33.0 (*)    INR 3.4 (*)    All other components within normal limits  APTT - Abnormal; Notable for the following components:   aPTT 47 (*)    All other components within normal limits  CBG MONITORING, ED - Abnormal; Notable for the  following components:   Glucose-Capillary 293 (*)    All other components within normal limits  URINE CULTURE  CULTURE, BLOOD (ROUTINE X 2)  CULTURE, BLOOD (ROUTINE X 2)  RESPIRATORY PANEL BY RT PCR (FLU A&B, COVID)  URINALYSIS, ROUTINE W REFLEX MICROSCOPIC  PATHOLOGIST SMEAR REVIEW    EKG None  ED ECG REPORT   Date: 04/26/2020  Rate: 87  Rhythm: normal sinus rhythm  QRS Axis: indeterminate  Intervals: PR prolonged  ST/T Wave abnormalities: normal  Conduction Disutrbances:Premature atrial contractions  Narrative Interpretation:   Old EKG Reviewed: none available  I have personally reviewed the EKG tracing and agree with the computerized printout as noted.   Radiology DG Chest Port 1 View  Result Date: 04/26/2020 CLINICAL DATA:  Questionable sepsis EXAM: PORTABLE CHEST 1 VIEW COMPARISON:  Portable exam 1410 hours compared to 01/16/2020 FINDINGS: RIGHT arm PICC line with tip projecting over RIGHT atrium. Upper normal heart size. Mediastinal contours and pulmonary vascularity normal. Atherosclerotic calcification aorta. Mild RIGHT basilar atelectasis. Skin fold projects over lower RIGHT lung. No infiltrate, pleural effusion or pneumothorax. IMPRESSION: Mild RIGHT basilar atelectasis. Electronically Signed   By: Crist Infante.D.  On: 04/26/2020 14:24   DG Foot Complete Right  Result Date: 04/26/2020 CLINICAL DATA:  Right heel abscess for an unknown period of time. EXAM: RIGHT FOOT COMPLETE - 3+ VIEW COMPARISON:  Right foot radiographs dated 01/16/2020 FINDINGS: There is no evidence of fracture or dislocation. There is a skin defect overlying the heel which appears to extend to the inferior/posterior surface of the calcaneus. There is a focal area of osseous demineralization along the posterior and inferior aspect of the calcaneus, concerning for osteomyelitis. IMPRESSION: 1. Skin defect overlying the heel which appears to extend to the inferior/posterior surface of the calcaneus  where there is a focal area of osseous demineralization, concerning for osteomyelitis. Electronically Signed   By: Zerita Boers M.D.   On: 04/26/2020 15:34    Procedures Procedures (including critical care time)  Medications Ordered in ED Medications  lactated ringers infusion (has no administration in time range)  ceFEPIme (MAXIPIME) 2 g in sodium chloride 0.9 % 100 mL IVPB (2 g Intravenous New Bag/Given 04/26/20 1554)  vancomycin (VANCOREADY) IVPB 1500 mg/300 mL (has no administration in time range)  ceFEPIme (MAXIPIME) 2 g in sodium chloride 0.9 % 100 mL IVPB (has no administration in time range)  vancomycin (VANCOREADY) IVPB 500 mg/100 mL (has no administration in time range)  lactated ringers bolus 1,000 mL (1,000 mLs Intravenous New Bag/Given 04/26/20 1456)  metroNIDAZOLE (FLAGYL) IVPB 500 mg (0 mg Intravenous Stopped 04/26/20 1602)    ED Course  I have reviewed the triage vital signs and the nursing notes.  Pertinent labs & imaging results that were available during my care of the patient were reviewed by me and considered in my medical decision making (see chart for details).    MDM Rules/Calculators/A&P                         79 year old nonverbal patient presents from facility for concern of elevated white count, and new rash on the neck, trunk, and upper extremities.   Vital signs on intake are reassuring, patient is afebrile, normotensive, not tachycardic, O2 sat 100% on room air  Sepsis labs ordered, including cultures.   At time of my evaluation of the patient his blood pressure has decreased to 96/42 on the monitor.  We will bolus with a liter of LR, begin maintenance fluids.   CBC with leukocytosis 23.6, previously 13.9 (12/2019). Will proceed with antibiotic coverage for undifferentiated source of sepsis.  Anemia with Hgb of 8.9, previously 11.9 (12/2019).  CMP mildly hyponatremic to 132, AKI with creatinine of 1.25 /BUN 32 ALT 66.  CBG 293.  CXR with mild  right basilar atelectasis. Right arm PICC with tip projecting over right atrium.  EKG NSR, no STEMI, confirmed by attending physician Dr. [name].  DG right foot pending  Attempted to contact patient's facility for further insight to the patient's medical history.  Unfortunately it appears that this patient will need to be admitted to the hospital.   Lactic acid elevated to 2.6, repeat 2.5.   Nursing staff having difficulty obtaining IV access this patient.  Currently running antibiotics through patient's PICC line, however having issues with slow flow through this access point. Patient has only received IV Flagyl up to this point. IV team consulted to obtain access.   X-ray of the right foot showed skin defect overlying the heel which appears to extend to the inferior/posterior surface of calcaneus, where there is a focal area of osseous demineralization concerning for osteomyelitis.  Internal Medicine service, Dr. Dereck Leep, consulted for medical admission.  She is agreeable to seeing this patient in the emergency department and admitting him to her service.  Appreciate her contribution to care for this patient.   Final Clinical Impression(s) / ED Diagnoses Final diagnoses:  Wound check, abscess  Acute hematogenous osteomyelitis of right foot [facility])    Rx / DC Orders ED Discharge Orders    None       Emeline Darling, PA-C 04/26/20 1610    Lashona Schaaf, Gypsy Balsam, PA-C 04/26/20 1620    Margette Fast, MD 04/27/20 1036

## 2020-04-26 NOTE — ED Triage Notes (Signed)
Pt brought in by EMS from [facility]. Pt is chronically treated with vancomycin but is experiencing elevated WBC. Pt has a hx of wounds on bilateral heels, sacrum and R hip with a new rash around the neck area. Per EMS the pt has not been treated with vancomycin for a week, but unknown why. V/S per EMS 130/74 CBG 364 Temp 97.7 Pulse- 92. Per EMS and [employer]. EMS pt is baseline nonverbal but also AOx2.

## 2020-04-26 NOTE — Progress Notes (Signed)
Pharmacy Antibiotic Note  Hayden [name] is a 79 y.o. male admitted on 04/26/2020 with sepsis.  Pharmacy has been consulted for vancomycin and cefepime dosing. Pt also on flagyl per primary. Pt has chronic wounds on bilateral heels, scram, and hip. He has a rash on his neck, arms, back, upper legs as well. Pt has been on chronic vancomycin therapy but it was held x1 wks prior to this admission for unknown reason. This is day 1 of therapy here. Pt is afebrile, WBC 23.6, LA 2.6.   Plan: Vancomycin 1500mg  IV x1  Vancomycin 500mg  IV q12h Cefepime 2g IV q12h Flagyl per primary  Monitor CBC, renal function, vanc trough at steady state, clinical progress, and micro data as available  Weight: 63.5 kg (139 lb 15.9 oz)  Temp (24hrs), Avg:97.8 F (36.6 C), Min:97.8 F (36.6 C), Max:97.8 F (36.6 C)  Recent Labs  Lab 04/26/20 1354  WBC 23.6*  CREATININE 1.25*  LATICACIDVEN 2.6*    Estimated Creatinine Clearance: 42.3 mL/min (A) (by C-G formula based on SCr of 1.25 mg/dL (H)).    Allergies  Allergen Reactions   Bee Venom Other (See Comments)    Bump where stung and took days to go away   Glucophage [Metformin] Diarrhea    "I will never take it again - stool incontinent"   Shellfish Allergy Swelling   Grass Extracts [Gramineae Pollens] Other (See Comments)    Unknown rxn   Tobacco [Tobacco] Other (See Comments)    Unknown rxn    Antimicrobials this admission: vanc 10/28> Cefepime 10/28> Flagyl x1 10/28    Microbiology results: 10/28 Bcx ordered 10/28 UA, UCx ordered  Thank you for allowing pharmacy to be a part of this patients care.  Carolin Guernsey  PGY1 Pharmacy resident 04/26/2020 3:15 PM

## 2020-04-26 NOTE — H&P (Addendum)
Date: 04/26/2020               Patient Name:  Hayden [name] MRN: [id number]  DOB: 1940-12-17 Age / Sex: 79 y.o., male   PCP: Patient, No Pcp Per         Medical Service: Internal Medicine Teaching Service         Attending Physician: Dr. Heber Nanakuli, Rachel Moulds, DO    First Contact: Dr. [name] Pager: [phone]  Second Contact: Dr. Laural Golden Pager: [phone]       After Hours (After 5p/  First Contact Pager: [phone]  weekends / holidays): Second Contact Pager: [phone]    Chief Complaint: Leukocytosis  History of Present Illness: Hayden [name] is an 79 year old man with a past medical history of prior stroke, dysphagia, CLL, prior left femoral fracture, major depressive disorder, hypertension and type 2 diabetes presenting with leukocytosis.  History was obtained by the ED provider and facility paperwork.  Patient is presenting from [facility] facility with a white blood cell count of 23 today.  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  He had a PICC line placed on 02/21/2020 and started on vancomycin and Zosyn.  Per EMS report patient is chronically on vancomycin however has not received a single medication over a week. The unstageable DTI to left heel is of at least 75 days duration.  He was sent to the hospital after a white blood cell count showed a leukocytosis of 23.  On arrival to the ED, patient was afebrile with stable vital signs.  Found to have a lactic acid of 2.6 and given a 1 L LR bolus.  Creatinine was 1.25.  Leukocytosis of 23.6.  Hemoglobin 8.9.  INR 3.4.  Right foot x-ray concerning for osteomyelitis.  Patient was started on vancomycin,  metronidazole and cefepime in the ED.  Reportedly patient is nonverbal due to prior stroke.  However on examination he was able to communicate albeit slowly.  He was alert and oriented only to self. Patient states he is here because he broke his hip. Reoriented patient that he is here because he has multiple ulcers. Patient denies being in any pain and states he is feeling okay. Patient endorses being cold at this time. Eating and drinking well. Denies abdominal pain. Denies itching.   Lab Orders     Urine culture     Blood Culture (routine x 2)     Respiratory Panel by RT PCR (Flu A&B, Covid) - Nasopharyngeal Swab     Lactic acid, plasma     Comprehensive metabolic panel     CBC WITH DIFFERENTIAL     Protime-INR     APTT     Urinalysis, Routine w reflex microscopic     Pathologist smear review     Sedimentation rate     C-reactive protein     Procalcitonin     CBC     Comprehensive metabolic panel     RPR     CBG monitoring, ED   Meds:  Current Meds  Medication Sig  . diphenhydrAMINE (BENADRYL) 25 MG tablet Take 25 mg by mouth  every 8 (eight) hours. 3 Day Course  . guaiFENesin (ROBITUSSIN) 100 MG/5ML liquid Take 200 mg by mouth every 6 (six) hours as needed for cough.  . Infant Care Products (DERMACLOUD) CREA Apply 1 application topically in the morning and at bedtime. Buttocks  . insulin lispro (HUMALOG) 100 UNIT/ML KwikPen Inject 6 Units into the skin 3 (three) times daily with meals.  Marland Kitchen LEVEMIR FLEXTOUCH 100 UNIT/ML Pen Inject 24 Units into the skin at bedtime. (Patient taking differently: Inject 24 Units into the skin daily. )  . loperamide (IMODIUM) 2 MG capsule Take 2 mg by mouth See admin instructions. Take 2 mg by mouth with each loose stool as needed for diarrhea (do not exceed 8 doses in 24 hours)  . lovastatin (MEVACOR) 20 MG tablet Take 20 mg by mouth at bedtime.   . magnesium hydroxide (MILK OF MAGNESIA) 400 MG/5ML suspension Take 30 mLs by mouth at bedtime as needed  for mild constipation.  Marland Kitchen neomycin-bacitracin-polymyxin (NEOSPORIN) [phone] ointment Apply 1 application topically See admin instructions. *Standing order for skin tear, abrasions* Clean area with normal saline. Apply tao, cover with bandaid or gauze and tape. Change as needed until healed.  . nystatin cream (MYCOSTATIN) Apply 1 application topically every 12 (twelve) hours. 14 Days  . omeprazole (PRILOSEC) 20 MG capsule Take 1 capsule (20 mg total) by mouth daily.  Marland Kitchen oxyCODONE (OXY IR/ROXICODONE) 5 MG immediate release tablet Take 0.5-1 pills every 6 hrs as needed for pain (Patient taking differently: Take 5 mg by mouth every 12 (twelve) hours as needed for severe pain. )  . Pollen Extracts (PROSTAT PO) Take 30 mLs by mouth in the morning, at noon, and at bedtime.  . risperiDONE (RISPERDAL) 1 MG tablet Take 1 tablet (1 mg total) by mouth in the morning and at bedtime.  . rivaroxaban (XARELTO) 20 MG TABS tablet Take 1 tablet (20 mg total) by mouth daily with supper. Patient will be on Xarelto loading dose until 06/07/2019, then he will be transitioned to this dose Xarelto 20 mg p.o. once daily on 06/08/2019 (Patient taking differently: Take 20 mg by mouth daily. )  . sertraline (ZOLOFT) 25 MG tablet Take 50 mg by mouth daily.   . Skin Protectants, Misc. (BAZA PROTECT EX) Apply 1 application topically as needed (incontinence episode). Buttocks and scrotum  . tamsulosin (FLOMAX) 0.4 MG CAPS capsule Take 0.4 mg by mouth daily.     Social: Unable to obtain.  Presenting from a skilled nursing facility.  Reports he has no family in the area.  Family History: Unable to obtain.  Allergies: Allergies as of 04/26/2020 - Review Complete 04/26/2020  Allergen Reaction Noted  . Bee venom Other (See Comments) 07/16/2011  . Glucophage [metformin] Diarrhea 03/01/2014  . Shellfish allergy Swelling 07/16/2011  . Grass extracts [gramineae pollens] Other (See Comments) 03/08/2018  . Tobacco [tobacco] Other (See  Comments) 03/08/2018   Past Medical History:  Diagnosis Date  . Candidiasis   . Depression   . Diabetes mellitus   . Enlarged prostate   . Fibromyalgia   . GERD (gastroesophageal reflux disease)    reflux intermittent  . Hyperlipidemia   . 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   Physical Exam: Blood pressure (!) 116/54, pulse 97, temperature 97.8 F (36.6 C), temperature  source Oral, resp. rate 14, weight 63.5 kg, SpO2 99 %.  General: Patient comfortably lying in bed, no acute distress HEENT: normocephalic, conjunctiva normal CV: Regular rhythm, tachycardic, no murmurs, no edema Lungs: Clear to auscultation bilaterally, normal work of breathing Abdomen: Bowel sounds present, nondistended, soft, no tenderness to palpation Skin: Diffuse macular erythematous lacy rash of bilateral upper and lower extremities, wounds not assessed Neuro: Alert, oriented only to self, appears at baseline MSK: No swelling, extremity edema or tenderness Psychiatric: Mood normal, behavior normal   Labs: CBC    Component Value Date/Time   WBC 23.6 (H) 04/26/2020 1354   RBC 3.37 (L) 04/26/2020 1354   HGB 8.9 (L) 04/26/2020 1354   HGB 13.4 03/01/2014 1337   HCT 29.8 (L) 04/26/2020 1354   HCT 42.1 07/04/2018 1237   HCT 40.0 03/01/2014 1337   PLT 365 04/26/2020 1354   PLT 163 03/01/2014 1337   MCV 88.4 04/26/2020 1354   MCV 93.5 03/01/2014 1337   MCH 26.4 04/26/2020 1354   MCHC 29.9 (L) 04/26/2020 1354   RDW 15.0 04/26/2020 1354   RDW 13.1 03/01/2014 1337   LYMPHSABS 10.3 (H) 04/26/2020 1354   LYMPHSABS 7.7 (H) 03/01/2014 1337   MONOABS 0.4 04/26/2020 1354   MONOABS 0.4 03/01/2014 1337   EOSABS 1.2 (H) 04/26/2020 1354   EOSABS 0.2 03/01/2014 1337   BASOSABS 0.1 04/26/2020 1354   BASOSABS 0.0 03/01/2014  1337     CMP     Component Value Date/Time   NA 132 (L) 04/26/2020 1354   NA 139 03/01/2014 1338   K 4.0 04/26/2020 1354   K 4.4 03/01/2014 1338   CL 96 (L) 04/26/2020 1354   CO2 26 04/26/2020 1354   CO2 22 03/01/2014 1338   GLUCOSE 315 (H) 04/26/2020 1354   GLUCOSE 236 (H) 03/01/2014 1338   BUN 32 (H) 04/26/2020 1354   BUN 21.3 03/01/2014 1338   CREATININE 1.25 (H) 04/26/2020 1354   CREATININE 0.9 03/01/2014 1338   CALCIUM 8.5 (L) 04/26/2020 1354   CALCIUM 9.0 03/01/2014 1338   PROT 5.4 (L) 04/26/2020 1354   PROT 6.4 03/01/2014 1338   ALBUMIN 2.3 (L) 04/26/2020 1354   ALBUMIN 4.1 03/01/2014 1338   AST 37 04/26/2020 1354   AST 16 03/01/2014 1338   ALT 66 (H) 04/26/2020 1354   ALT 18 03/01/2014 1338   ALKPHOS 77 04/26/2020 1354   ALKPHOS 58 03/01/2014 1338   BILITOT 0.4 04/26/2020 1354   BILITOT 0.84 03/01/2014 1338   GFRNONAA 58 (L) 04/26/2020 1354   GFRAA >60 01/19/2020 0329    Imaging: DG Chest Port 1 View  Result Date: 04/26/2020 CLINICAL DATA:  Questionable sepsis EXAM: PORTABLE CHEST 1 VIEW COMPARISON:  Portable exam 1410 hours compared to 01/16/2020 FINDINGS: RIGHT arm PICC line with tip projecting over RIGHT atrium. Upper normal heart size. Mediastinal contours and pulmonary vascularity normal. Atherosclerotic calcification aorta. Mild RIGHT basilar atelectasis. Skin fold projects over lower RIGHT lung. No infiltrate, pleural effusion or pneumothorax. IMPRESSION: Mild RIGHT basilar atelectasis. Electronically Signed   By: Lavonia Dana M.D.   On: 04/26/2020 14:24   DG Foot Complete Right  Result Date: 04/26/2020 CLINICAL DATA:  Right heel abscess for an unknown period of time. EXAM: RIGHT FOOT COMPLETE - 3+ VIEW COMPARISON:  Right foot radiographs dated 01/16/2020 FINDINGS: There is no evidence of fracture or dislocation. There is a skin defect overlying the heel which appears to extend to the inferior/posterior surface of the calcaneus. There is  a focal area of  osseous demineralization along the posterior and inferior aspect of the calcaneus, concerning for osteomyelitis. IMPRESSION: 1. Skin defect overlying the heel which appears to extend to the inferior/posterior surface of the calcaneus where there is a focal area of osseous demineralization, concerning for osteomyelitis. Electronically Signed   By: Zerita Boers M.D.   On: 04/26/2020 15:34    EKG: personally reviewed my interpretation is normal sinus rhythm  Assessment & Plan by Problem: Principal Problem:   Sepsis ([place]) Active Problems:   Type 2 diabetes mellitus without complication, with long-term current use of insulin (HCC)   Pressure injury of skin   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  Sepsis in the setting of multiple ulcers, concerning for osteomyelitis Patient presents from skilled nursing facility with leukocytosis in the setting of multiple wounds.    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  There is an  unstageable deep tissue injury of the right first toe partial thickness, she went to the  right hip full-thickness, unstageable deep tissue injury of the left medial first toe with  partial thickness, unstageable deep tissue injury of the right plantar foot (resolved on  1021) unstageable deep tissue injury of the left plantar foot partial-thickness.  Of note  the pressure wound of left plantar foot is new of 8 days duration.  Right  heel radiograph showed skin defect overlying the heel which appears to extend to the inferior posterior surface of the calcaneus with a focal area of osseous demineralization, concerning for osteomyelitis.  In the ED patient was given vancomycin, cefepime and metronidazole.  Lactic acid elevated at 2.6, WBC at 23.  Patient is afebrile. Will consult wound care and continue IV fluids and antibiotic therapy. -Follow-up CRP and ESR -Consult wound care, appreciate recommendations -Continue vancomycin and cefepime -Trend lactic acid -Continue LR infusion  Fungal infection of the groin perianal area Reportedly has a white penile ulceration, started on fluconazole 150 mg daily at his skilled nursing facility.  Plan to continue fluconazole and nystatin powder with barrier cream. -Follow-up RPR  AKI Cr on admission 1.25, baseline 0.6. BUN 32. Likely pre-renal. Possibly due to long term use of vancomycin. Giving IVF. Trend cmp.   Type 2 diabetes mellitus Continued home medications of insulin detemir 24 units daily and insulin aspart 6 units 3 times daily with meals.  Additional sliding scale coverage.  Major depressive disorder Continued home medication of risperidone for MDD.  History of stroke Dysphagia Reportedly patient is nonverbal due to prior stroke.  However on evaluation patient was able to communicate although slowly. Ordered dysphagia 3 diet.   History of pulmonary emboli On Xarelto 20 mg daily.  Hyperlipidemia Home medication lovastatin 20 mg, continued equivalent of pravastatin 20 mg daily.  GERD Pantoprazole 40 mg daily   Diet: Dysphagia 3 Diet  VTE: Xarelto IVF: LR,150 cc/hr Code: DNR  Dispo: Admit patient to Inpatient with expected length of stay greater than 2 midnights.  Signed: Harlow Ohms, DO Internal Medicine  Resident PGY-2 Pager: [phone] 04/26/2020, 7:22 PM

## 2020-04-27 DIAGNOSIS — F329 Major depressive disorder, single episode, unspecified: Secondary | ICD-10-CM

## 2020-04-27 DIAGNOSIS — Z8673 Personal history of transient ischemic attack (TIA), and cerebral infarction without residual deficits: Secondary | ICD-10-CM

## 2020-04-27 DIAGNOSIS — R1310 Dysphagia, unspecified: Secondary | ICD-10-CM

## 2020-04-27 DIAGNOSIS — Z86711 Personal history of pulmonary embolism: Secondary | ICD-10-CM

## 2020-04-27 LAB — COMPREHENSIVE METABOLIC PANEL
ALT: 49 U/L — ABNORMAL HIGH (ref 0–44)
AST: 25 U/L (ref 15–41)
Albumin: 2.2 g/dL — ABNORMAL LOW (ref 3.5–5.0)
Alkaline Phosphatase: 65 U/L (ref 38–126)
Anion gap: 10 (ref 5–15)
BUN: 25 mg/dL — ABNORMAL HIGH (ref 8–23)
CO2: 26 mmol/L (ref 22–32)
Calcium: 8.4 mg/dL — ABNORMAL LOW (ref 8.9–10.3)
Chloride: 100 mmol/L (ref 98–111)
Creatinine, Ser: 1.12 mg/dL (ref 0.61–1.24)
GFR, Estimated: >60 mL/min (ref >60)
Glucose, Bld: 91 mg/dL (ref 70–99)
Potassium: 3.6 mmol/L (ref 3.5–5.1)
Sodium: 136 mmol/L (ref 135–145)
Total Bilirubin: 0.6 mg/dL (ref 0.3–1.2)
Total Protein: 5.2 g/dL — ABNORMAL LOW (ref 6.5–8.1)

## 2020-04-27 LAB — PROCALCITONIN: Procalcitonin: 0.22 ng/mL

## 2020-04-27 LAB — RPR: RPR Ser Ql: NONREACTIVE

## 2020-04-27 LAB — CBC
HCT: 25.4 % — ABNORMAL LOW (ref 39.0–52.0)
Hemoglobin: 7.9 g/dL — ABNORMAL LOW (ref 13.0–17.0)
MCH: 26.5 pg (ref 26.0–34.0)
MCHC: 31.1 g/dL (ref 30.0–36.0)
MCV: 85.2 fL (ref 80.0–100.0)
Platelets: 318 10*3/uL (ref 150–400)
RBC: 2.98 MIL/uL — ABNORMAL LOW (ref 4.22–5.81)
RDW: 15 % (ref 11.5–15.5)
WBC: 23.3 10*3/uL — ABNORMAL HIGH (ref 4.0–10.5)
nRBC: 0 % (ref 0.0–0.2)

## 2020-04-27 LAB — GLUCOSE, CAPILLARY
Glucose-Capillary: 94 mg/dL (ref 70–99)
Glucose-Capillary: 75 mg/dL (ref 70–99)
Glucose-Capillary: 61 mg/dL — ABNORMAL LOW (ref 70–99)
Glucose-Capillary: 81 mg/dL (ref 70–99)
Glucose-Capillary: 104 mg/dL — ABNORMAL HIGH (ref 70–99)

## 2020-04-27 LAB — LACTIC ACID, PLASMA: Lactic Acid, Venous: 0.8 mmol/L (ref 0.5–1.9)

## 2020-04-27 LAB — PATHOLOGIST SMEAR REVIEW

## 2020-04-27 MED ORDER — OXYCODONE HCL 5 MG PO TABS
5.0000 mg | ORAL_TABLET | Freq: Three times a day (TID) | ORAL | Status: DC | PRN
  Administered 2020-04-27 – 2020-04-29 (×4): 5 mg via ORAL
  Filled 2020-04-27 (×4): qty 1

## 2020-04-27 MED ORDER — CHLORHEXIDINE GLUCONATE CLOTH 2 % EX PADS
6.0000 | MEDICATED_PAD | Freq: Every day | CUTANEOUS | Status: DC
  Administered 2020-04-27 – 2020-04-30 (×4): 6 via TOPICAL

## 2020-04-27 MED ORDER — INSULIN DETEMIR 100 UNIT/ML ~~LOC~~ SOLN
20.0000 [IU] | Freq: Every day | SUBCUTANEOUS | Status: DC
  Filled 2020-04-27: qty 0.2

## 2020-04-27 MED ORDER — LACTATED RINGERS IV SOLN: INTRAVENOUS | Status: AC

## 2020-04-27 MED ORDER — ZINC OXIDE 12.8 % EX OINT
TOPICAL_OINTMENT | Freq: Two times a day (BID) | CUTANEOUS | Status: DC
  Filled 2020-04-27: qty 56.7

## 2020-04-27 MED ORDER — COLLAGENASE 250 UNIT/GM EX OINT
TOPICAL_OINTMENT | Freq: Every day | CUTANEOUS | Status: DC
  Filled 2020-04-27: qty 30

## 2020-04-27 MED ORDER — ENSURE ENLIVE PO LIQD
237.0000 mL | Freq: Three times a day (TID) | ORAL | Status: DC
  Administered 2020-04-27 – 2020-04-29 (×7): 237 mL via ORAL

## 2020-04-27 MED ORDER — ADULT MULTIVITAMIN W/MINERALS CH
1.0000 | ORAL_TABLET | Freq: Every day | ORAL | Status: DC
  Administered 2020-04-28 – 2020-04-29 (×2): 1 via ORAL
  Filled 2020-04-27 (×2): qty 1

## 2020-04-27 NOTE — Plan of Care (Signed)

## 2020-04-27 NOTE — Progress Notes (Signed)
HD#1 Subjective:   Patient admitted yesterday evening. No acute events overnight.  Evaluated at bedside during rounds this morning. Patient was sleeping, resting comfortably in no acute distress, but easily arousable to voice. Oriented to self, and would not respond to other orientation questions or questions regarding pain or discomfort.  After rounds spoke with his brother and healthcare power of attorney, Hayden [name], to discuss patient's goals of care. Discussed that given the concern for osteomyelitis, we would typically consult orthopedic surgery to evaluate the patient for surgical intervention. Also offered to involve palliative care. [name] stated he would favor involving pursuing long-term antibiotics and surgery unless the patient were imminently going to die, but he requested that I discuss with his sister, who is a Youth worker. I then spoken with the patient's sister, Hayden [name], who provided additional history. 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 Notes that in the last two years when he has had two falls and required emergent orthopedic surgery, he has been involved in his own healthcare decisions. She reports he was living in [facility] assisted living facility for two years, then in the last year has had two surgeries after falling and breaking each hip. He has since been in [facility] for rehab and has been seeing a wound care doctor, Hayden [name] [phone]) for treatment of sacral decubitus ulcers. When I presented the option to involve our surgical colleagues and/or palliative care, she states she would prefer for the patient be evaluated by surgery to determine the extent of surgical intervention which would be required and be actively involved in his healthcare decisions. [name] requests to be kept in the loop regarding the patient's  progress and treatment plan.   Objective:   Vital signs in last 24 hours: Vitals:   04/26/20 1800 04/26/20 1932 04/26/20 2031 04/27/20 0603  BP: (!) 116/54 127/67 122/79 (!) 114/95  Pulse: 97 93 (!) 102 89  Resp: _0 Temp:  97.8 F (36.6 C) 98 F (36.7 C) 97.8 F (36.6 C)  TempSrc:  Oral  Oral  SpO2: 99% 100% 90% 100%  Weight:       Physical Exam Constitutional: chronically ill-appearing gentleman laying in bed, in no acute distress, sleeping at first but arouses easily to voice Cardiovascular: regular rate and rhythm, no m/r/g Pulmonary/Chest: normal work of breathing on room air, lungs clear to auscultation bilaterally Neurological: oriented to self, does not answer additional orientation questions Skin: Diffuse macular erythematous lacy rash of bilateral upper and lower extremities, appears less erythematous compared to yesterday, wound dressings c/d/i Psych: speech is slow   Pertinent Labs: CBC Latest Ref Rng & Units 04/27/2020 04/26/2020 01/20/2020  WBC 4.0 - 10.5 K/uL 23.3(H) 23.6(H) 13.9(H)  Hemoglobin 13.0 - 17.0 g/dL 7.9(L) 8.9(L) 11.9(L)  Hematocrit 39 - 52 % 25.4(L) 29.8(L) 34.9(L)  Platelets 150 - 400 K/uL 318 365 257    CMP Latest Ref Rng & Units 04/27/2020 04/26/2020 01/19/2020  Glucose 70 - 99 mg/dL 91 315(H) 198(H)  BUN 8 - 23 mg/dL 25(H) 32(H) 14  Creatinine 0.61 - 1.24 mg/dL 1.12 1.25(H) 0.71  Sodium 135 - 145 mmol/L 136 132(L) 140  Potassium 3.5 - 5.1 mmol/L 3.6 4.0 3.7  Chloride 98 - 111 mmol/L 100 96(L) 105  CO2 22 - 32 mmol/L _1 Calcium 8.9 - 10.3 mg/dL 8.4(L)  8.5(L) 8.7(L)  Total Protein 6.5 - 8.1 g/dL 5.2(L) 5.4(L) -  Total Bilirubin 0.3 - 1.2 mg/dL 0.6 0.4 -  Alkaline Phos 38 - 126 U/L 65 77 -  AST 15 - 41 U/L 25 37 -  ALT 0 - 44 U/L 49(H) 66(H) -    Imaging: DG Chest Port 1 View  Result Date: 04/26/2020 CLINICAL DATA:  Questionable sepsis EXAM: PORTABLE CHEST 1 VIEW COMPARISON:  Portable exam 1410 hours compared to  01/16/2020 FINDINGS: RIGHT arm PICC line with tip projecting over RIGHT atrium. Upper normal heart size. Mediastinal contours and pulmonary vascularity normal. Atherosclerotic calcification aorta. Mild RIGHT basilar atelectasis. Skin fold projects over lower RIGHT lung. No infiltrate, pleural effusion or pneumothorax. IMPRESSION: Mild RIGHT basilar atelectasis. Electronically Signed   By: Hayden [name] M.D.   On: 04/26/2020 14:24   DG Foot Complete Right  Result Date: 04/26/2020 CLINICAL DATA:  Right heel abscess for an unknown period of time. EXAM: RIGHT FOOT COMPLETE - 3+ VIEW COMPARISON:  Right foot radiographs dated 01/16/2020 FINDINGS: There is no evidence of fracture or dislocation. There is a skin defect overlying the heel which appears to extend to the inferior/posterior surface of the calcaneus. There is a focal area of osseous demineralization along the posterior and inferior aspect of the calcaneus, concerning for osteomyelitis. IMPRESSION: 1. Skin defect overlying the heel which appears to extend to the inferior/posterior surface of the calcaneus where there is a focal area of osseous demineralization, concerning for osteomyelitis. Electronically Signed   By: Hayden [name] M.D.   On: 04/26/2020 15:34    Assessment/Plan:   Principal Problem:   Sepsis ([place]) Active Problems:   Type 2 diabetes mellitus without complication, with long-term current use of insulin (HCC)   Pressure injury of skin   Patient Summary:  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  This is hospital day 1.  Sepsis in the setting of multiple wounds R heel wound concerning for osteomyelitis This morning, patient continues to be oriented to self  only. Per discussion with his family, at baseline his lucidity waxes and wanes. Vitals are stable. WBC stable at 23. Lactic acid has downtrended from 2.5 to 0.8. Blood cultures pending. ESR/CRP elevated at 75/14.4. In setting of Xray findings of R calcaneal osseous demineralization, presentation concerning for osteomyelitis. Per conversation with patient's family, will continue IV antibiotic treatment and consult orthopedics for surgical evaluation. Family would like to be informed regarding treatment options and hopeful that patient can be actively involved in his treatment.  - Orthopedic surgery consulted, appreciate recs - Wound care consulted, appreciate recs - Continue vancomycin and cefepime - Continue LR @ 75 cc/hr - Nutrition consult  Fungal infection of the groin perianal area RPR negative - Continue fluconazole and nystatin powder with barrier cream  AKI, improving sCr 1.12 this morning, down from 1.25 on admission.  - Continue LR infusion  Type 2 diabetes mellitus Home regimen insulin detemir 24 units daily and insulin aspart 6 units 3 times daily with meals. Continued on admission, however will discontinue mealtime coverage for now given poor PO intake. Glucose control will be important to promote wound healing, however also want to avoid lows. - Discontinue mealtime coverage - Decrease insulin detemir to 20 units daily  Major depressive disorder - Continue home risperidone 1 mg twice daily - Continue home zoloft 50 mg daily  History of stroke Dysphagia - Dysphagia 3 diet per SNF records - SLP evaluation - Nutrition consult  History of pulmonary emboli - Continue home Xarelto 20 mg daily   Diet: Dysphagia 3 IVF: LR,75cc/hr VTE: Xarelto Code: DNR    Please contact the on call pager after 5 pm and on weekends at [phone].  Alexandria Lodge, MD PGY-1 Internal Medicine Teaching Service Pager: [phone] 04/27/2020

## 2020-04-27 NOTE — Evaluation (Signed)
Clinical/Bedside Swallow Evaluation Patient Details  Name: Hayden [name] MRN: [id number] Date of Birth: 1941/05/10  Today's Date: 04/27/2020 Time: SLP Start Time (ACUTE ONLY): 1506 SLP Stop Time (ACUTE ONLY): 1522 SLP Time Calculation (min) (ACUTE ONLY): 16 min  Past Medical History:  Past Medical History:  Diagnosis Date  . Candidiasis   . Depression   . Diabetes mellitus   . Enlarged prostate   . Fibromyalgia   . GERD (gastroesophageal reflux disease)    reflux intermittent  . Hyperlipidemia   . 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 ADENOIDECTOMY    . CARPAL TUNNEL RELEASE     rt hand  . CATARACT EXTRACTION, BILATERAL    . CERVICAL FUSION  1998  . CYSTOSCOPY  07/26/2012   Procedure: CYSTOSCOPY;  Surgeon: Ailene Rud, MD;  Location: [facility];  Service: Urology;  Laterality: N/A;  DIAGNOSTIC CYSTO PROSTATE ULTRASOUND      . FEMUR IM NAIL Right 02/10/2019   Procedure: INTRAMEDULLARY (IM) NAIL FEMORAL;  Surgeon: Hiram Gash, MD;  Location: [facility];  Service: Orthopedics;  Laterality: Right;  . HIP ARTHROPLASTY Left 01/17/2020   Procedure: ARTHROPLASTY LATERAL POSTERIOR APPROACH HIP (HEMIARTHROPLASTY);  Surgeon: Renette Butters, MD;  Location: [facility];  Service: Orthopedics;  Laterality: Left;  . PROSTATE BIOPSY  07/26/2012   Procedure: PROSTATE BIOPSY;  Surgeon: Ailene Rud, MD;  Location: [facility];  Service: Urology;  Laterality: N/A;  . retinal micro aneurysms    . tonsil    . VASECTOMY    . VITRECTOMY     HPI:  [name] is an 79 year old man with a past medical history of prior stroke, dysphagia, CLL, prior left femoral fracture, major depressive disorder, hypertension and type 2 diabetes presenting with leukocytosis.  Patient is presenting from [place] facility with a white blood cell count of 23 today. Found to have sepsis in setting of multiple wounds. MBS 12/06/19 mild aspiration x 1, decreased oral cohesion, Dys 3/thin recommended. CXR Mild RIGHT basilar atelectasis.   Assessment / Plan / Recommendation Clinical Impression  Mr Hayden [name] has history of dysphagia and recommended for Dys 3/thin following MBS 11/2019. Overall exhibits decreased endurance, deconditioning and diminished respiratory effort for cough and verbalization. Consumed approximately 2-3 oz without s/s aspiration although history of silent vocal cord penetration and briefly below vocal cords. Impulsive with liquids and required pacing for rest breaks. He reported he consumes puree at SNF and solids not trialed at this time. Recommend puree (Dys 1), thin liquids, straws allowed, small sips, crush meds and full assist.     SLP Visit Diagnosis: Dysphagia, unspecified (R13.10)    Aspiration Risk  Moderate aspiration risk    Diet Recommendation Dysphagia 1 (Puree);Thin liquid   Liquid Administration via: Cup;Straw Medication Administration: Crushed with puree Supervision: Staff to assist with self feeding;Full supervision/cueing for compensatory strategies;Patient able to self feed Compensations: Slow rate;Small sips/bites Postural Changes: Seated upright at 90 degrees    Other  Recommendations Oral Care Recommendations: Oral care BID   Follow up Recommendations Skilled Nursing facility      Frequency and Duration min 2x/week  2 weeks       Prognosis Prognosis for Safe Diet Advancement: Fair Barriers to Reach Goals:  (deconditioning)  Swallow Study   General HPI: [name] is an 79 year old man with a past medical history of prior stroke, dysphagia, CLL, prior left femoral fracture, major depressive disorder, hypertension and type 2 diabetes presenting with leukocytosis. Patient is presenting from [place] facility with a white blood cell count of 23 today. Found to have sepsis in setting of multiple wounds. MBS 12/06/19 mild aspiration x 1, decreased oral cohesion, Dys 3/thin recommended. CXR Mild RIGHT basilar atelectasis. Type of Study: Bedside Swallow Evaluation Previous Swallow Assessment:  (see HPI) Diet Prior to this Study: Dysphagia 3 (soft);Thin liquids Temperature Spikes Noted: No Respiratory Status: Room air History of Recent Intubation: No Behavior/Cognition: Lethargic/Drowsy;Cooperative;Requires cueing Oral Cavity Assessment: Dry Oral Care Completed by SLP: No Oral Cavity - Dentition: Missing dentition (majority present) Vision: Functional for self-feeding Self-Feeding Abilities: Needs assist Patient Positioning: Upright in bed Baseline Vocal Quality: Low vocal intensity;Hoarse Volitional Swallow: Able to elicit    Oral/Motor/Sensory Function Overall Oral Motor/Sensory Function: Within functional limits (slow movements)   Ice Chips Ice chips: Within functional limits   Thin Liquid Thin Liquid: Within functional limits Presentation: Straw;Spoon    Nectar Thick Nectar Thick Liquid: Not tested   Honey Thick Honey Thick Liquid: Not tested   Puree Puree: Within functional limits   Solid     Solid:  (pt states he eats puree- not alert enough for mastication )      Mick Sell, Orbie Pyo 04/27/2020,3:40 PM  Orbie Pyo [place].Ed Risk analyst [phone] Office [phone]

## 2020-04-27 NOTE — Consult Note (Signed)
[place] Nurse Consult Note: Patient receiving care in [facility] 2W05.  Reason for Consult: multiple wounds all pre-existing pressure injuries Wound type: Necrotic bilateral heel wounds. The right heel has palpable bone. The left heel is primarily covered in thick black eschar.  All bony prominences on both feet have been impaired by pressure and show signs of extensive pressure injuries, the majority of which are purple DPTIs, some are covered in black eschar. The sacral/coccyx area has a highly necrotic wound with palpable bone.  It measures 4.5 cm x 6.8 cm x 2 cm and has densely adherent, stringy yellow eschar. The left hip has an unstageable brown and yellow wound that measures 6 cm x 9.5 cm. The right hip has a very small, 0.3 cm x 1 cm x 0.2 cm stage 3 PI.  Pressure Injury POA: Yes Measurement: Wound bed: Drainage (amount, consistency, odor)  Periwound: fragile Dressing procedure/placement/frequency: Apply Santyl to the left hip and coccyx wounds in a nickel thick layer. Cover with a saline moistened gauze, then dry gauze or ABD pad.  Change daily. Foam dressings to bilateral heels, right lateral knee area of discoloration, and bilateral heels. No topical therapy alone will remedy the left hip (trochanter) unstageable wound, coccyx, or heels, or foot wounds. The patient is incontinent of stool.  I have ordered Triple paste for the scrotum.  The scrotum has a small, superficial wound from MASD-IAD.  I have sent a Secure Chat message to the supervising medical team asking for a general surgery consult for the coccyx wound, and for consideration of Hospice Care and pain management.  They are awaiting a family decision on treatment plan.  I have added an order for an air mattress.  Thank you for the consult.  Discussed plan of care with the bedside nurse.  [place] nurse will not follow at this time.  Please re-consult the [place] team if needed.  Val Riles, RN, MSN, CWOCN, CNS-BC, pager [phone]

## 2020-04-27 NOTE — Evaluation (Addendum)
Patients appears in a decent amount of pain, even with his oxy. MD aware. Orders updated and will try tylenol as well.

## 2020-04-27 NOTE — Consult Note (Signed)
Reason for Consult:Multiple wounds Referring Physician: E Hayden [name] is an 79 y.o. male.  HPI: Hayden [name] is admitted from a SNF where he resides. He has had long-standing decubiti, most notably on his heels, left hip, and sacrum. These stem from a CVA suffered at some point in the past that has left him contracted and immobile.  Past Medical History:  Diagnosis Date  . Candidiasis   . Depression   . Diabetes mellitus   . Enlarged prostate   . Fibromyalgia   . GERD (gastroesophageal reflux disease)    reflux intermittent  . Hyperlipidemia   . 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 ADENOIDECTOMY    . CARPAL TUNNEL RELEASE     rt hand  . CATARACT EXTRACTION, BILATERAL    . CERVICAL FUSION  1998  . CYSTOSCOPY  07/26/2012   Procedure: CYSTOSCOPY;  Surgeon: Ailene Rud, MD;  Location: [facility];  Service: Urology;  Laterality: N/A;  DIAGNOSTIC CYSTO PROSTATE ULTRASOUND      . FEMUR IM NAIL Right 02/10/2019   Procedure: INTRAMEDULLARY (IM) NAIL FEMORAL;  Surgeon: Hiram Gash, MD;  Location: [facility];  Service: Orthopedics;  Laterality: Right;  . HIP ARTHROPLASTY Left 01/17/2020   Procedure: ARTHROPLASTY LATERAL POSTERIOR APPROACH HIP (HEMIARTHROPLASTY);  Surgeon: Renette Butters, MD;  Location: [facility];  Service: Orthopedics;  Laterality: Left;  . PROSTATE BIOPSY  07/26/2012   Procedure: PROSTATE BIOPSY;  Surgeon: Ailene Rud, MD;  Location: [facility];  Service: Urology;  Laterality: N/A;  . retinal micro aneurysms    . tonsil    . VASECTOMY    . VITRECTOMY      Family History  Problem Relation Age of Onset  . Diabetes Father   . Diabetes Sister   . Diabetes Brother     Social History:  reports that he has never smoked. He has never used smokeless  tobacco. He reports that he does not drink alcohol and does not use drugs.  Allergies:  Allergies  Allergen Reactions  . Bee Venom Other (See Comments)    Bump where stung and took days to go away  . Glucophage [Metformin] Diarrhea    "I will never take it again - stool incontinent"  . Shellfish Allergy Swelling  . Grass Extracts [Gramineae Pollens] Other (See Comments)    Unknown rxn  . Tobacco [Tobacco] Other (See Comments)    Unknown rxn    Medications: I have reviewed the patient's current medications.  Results for orders placed or performed during the hospital encounter of 04/26/20 (from the past 48 hour(s))  CBG monitoring, ED     Status: Abnormal   Collection Time: 04/26/20  1:46 PM  Result Value Ref Range   Glucose-Capillary 293 (H) 70 - 99 mg/dL    Comment: Glucose reference range applies only to samples taken after fasting for at least 8 hours.   Comment 1 Notify RN    Comment 2 Document in Chart   Lactic acid, plasma     Status: Abnormal   Collection Time: 04/26/20  1:54 PM  Result Value Ref Range   Lactic Acid, Venous 2.6 (HH) 0.5 - 1.9 mmol/L    Comment: CRITICAL RESULT CALLED TO, READ BACK BY AND VERIFIED WITH: SHAFFER,E RN @1441  ON [id number] BY  FLEMINGS Performed at [facility], [place] [address] [place], [place] [address]   Comprehensive metabolic panel     Status: Abnormal   Collection Time: 04/26/20  1:54 PM  Result Value Ref Range   Sodium 132 (L) 135 - 145 mmol/L   Potassium 4.0 3.5 - 5.1 mmol/L   Chloride 96 (L) 98 - 111 mmol/L   CO2 26 22 - 32 mmol/L   Glucose, Bld 315 (H) 70 - 99 mg/dL    Comment: Glucose reference range applies only to samples taken after fasting for at least 8 hours.   BUN 32 (H) 8 - 23 mg/dL   Creatinine, Ser 1.25 (H) 0.61 - 1.24 mg/dL   Calcium 8.5 (L) 8.9 - 10.3 mg/dL   Total Protein 5.4 (L) 6.5 - 8.1 g/dL   Albumin 2.3 (L) 3.5 - 5.0 g/dL   AST 37 15 - 41 U/L   ALT 66 (H) 0 - 44 U/L   Alkaline Phosphatase 77 38 - 126  U/L   Total Bilirubin 0.4 0.3 - 1.2 mg/dL   GFR, Estimated 58 (L) >60 mL/min    Comment: (NOTE) Calculated using the CKD-EPI Creatinine Equation (2021)    Anion gap 10 5 - 15    Comment: Performed at [facility], [place] [address] [place], [place] [address]  CBC WITH DIFFERENTIAL     Status: Abnormal   Collection Time: 04/26/20  1:54 PM  Result Value Ref Range   WBC 23.6 (H) 4.0 - 10.5 K/uL   RBC 3.37 (L) 4.22 - 5.81 MIL/uL   Hemoglobin 8.9 (L) 13.0 - 17.0 g/dL   HCT 29.8 (L) 39 - 52 %   MCV 88.4 80.0 - 100.0 fL   MCH 26.4 26.0 - 34.0 pg   MCHC 29.9 (L) 30.0 - 36.0 g/dL   RDW 15.0 11.5 - 15.5 %   Platelets 365 150 - 400 K/uL   nRBC 0.0 0.0 - 0.2 %   Neutrophils Relative % 49 %   Neutro Abs 11.5 (H) 1.7 - 7.7 K/uL   Lymphocytes Relative 43 %   Lymphs Abs 10.3 (H) 0.7 - 4.0 K/uL   Monocytes Relative 2 %   Monocytes Absolute 0.4 0.1 - 1.0 K/uL   Eosinophils Relative 5 %   Eosinophils Absolute 1.2 (H) 0.0 - 0.5 K/uL   Basophils Relative 0 %   Basophils Absolute 0.1 0.0 - 0.1 K/uL   WBC Morphology ABSOLUTE LYMPHOCYTOSIS    Immature Granulocytes 1 %   Abs Immature Granulocytes 0.26 (H) 0.00 - 0.07 K/uL    Comment: Performed at [facility], [address], [place], [place] [address]  Protime-INR     Status: Abnormal   Collection Time: 04/26/20  1:54 PM  Result Value Ref Range   Prothrombin Time 33.0 (H) 11.4 - 15.2 seconds   INR 3.4 (H) 0.8 - 1.2    Comment: (NOTE) INR goal varies based on device and disease states. Performed at [facility], [place] [address], [place], [place] [address]   APTT     Status: Abnormal   Collection Time: 04/26/20  1:54 PM  Result Value Ref Range   aPTT 47 (H) 24 - 36 seconds    Comment:        IF BASELINE aPTT IS ELEVATED, SUGGEST PATIENT RISK ASSESSMENT BE USED TO DETERMINE APPROPRIATE ANTICOAGULANT THERAPY. Performed at [facility], [place] [address], [place], [place] [address]   Lactic acid, plasma  Status:  Abnormal   Collection Time: 04/26/20  3:00 PM  Result Value Ref Range   Lactic Acid, Venous 2.5 (HH) 0.5 - 1.9 mmol/L    Comment: CRITICAL VALUE NOTED.  VALUE IS CONSISTENT WITH PREVIOUSLY REPORTED AND CALLED VALUE. Performed at [facility], [place] [address], [place], [place] [address]   Respiratory Panel by RT PCR (Flu A&B, Covid) - Nasopharyngeal Swab     Status: None   Collection Time: 04/26/20  3:35 PM   Specimen: Nasopharyngeal Swab  Result Value Ref Range   SARS Coronavirus 2 by RT PCR NEGATIVE NEGATIVE    Comment: (NOTE) SARS-CoV-2 target nucleic acids are NOT DETECTED.  The SARS-CoV-2 RNA is generally detectable in upper respiratoy specimens during the acute phase of infection. The lowest concentration of SARS-CoV-2 viral copies this assay can detect is 131 copies/mL. A negative result does not preclude SARS-Cov-2 infection and should not be used as the sole basis for treatment or other patient management decisions. A negative result may occur with  improper specimen collection/handling, submission of specimen other than nasopharyngeal swab, presence of viral mutation(s) within the areas targeted by this assay, and inadequate number of viral copies (<131 copies/mL). A negative result must be combined with clinical observations, patient history, and epidemiological information. The expected result is Negative.  Fact Sheet for Patients:  PinkCheek.be  Fact Sheet for Healthcare Providers:  GravelBags.it  This test is no t yet approved or cleared by the [place] FDA and  has been authorized for detection and/or diagnosis of SARS-CoV-2 by FDA under an Emergency Use Authorization (EUA). This EUA will remain  in effect (meaning this test can be used) for the duration of the COVID-19 declaration under Section 564(b)(1) of the Act, 21 U.S.C. section 360bbb-3(b)(1), unless the authorization is terminated  or revoked sooner.     Influenza A by PCR NEGATIVE NEGATIVE   Influenza B by PCR NEGATIVE NEGATIVE    Comment: (NOTE) The Xpert Xpress SARS-CoV-2/FLU/RSV assay is intended as an aid in  the diagnosis of influenza from Nasopharyngeal swab specimens and  should not be used as a sole basis for treatment. Nasal washings and  aspirates are unacceptable for Xpert Xpress SARS-CoV-2/FLU/RSV  testing.  Fact Sheet for Patients: PinkCheek.be  Fact Sheet for Healthcare Providers: GravelBags.it  This test is not yet approved or cleared by the [place] FDA and  has been authorized for detection and/or diagnosis of SARS-CoV-2 by  FDA under an Emergency Use Authorization (EUA). This EUA will remain  in effect (meaning this test can be used) for the duration of the  Covid-19 declaration under Section 564(b)(1) of the Act, 21  U.S.C. section 360bbb-3(b)(1), unless the authorization is  terminated or revoked. Performed at [facility], [place] [address] [place], [place] [address]   Sedimentation rate     Status: Abnormal   Collection Time: 04/26/20  8:33 PM  Result Value Ref Range   Sed Rate 75 (H) 0 - 16 mm/hr    Comment: Performed at [facility] [address], [place], [place] [address]  C-reactive protein     Status: Abnormal   Collection Time: 04/26/20  8:33 PM  Result Value Ref Range   CRP 14.4 (H) <1.0 mg/dL    Comment: Performed at [facility], [place] [address] [place], [place] [address]  RPR     Status: None   Collection Time: 04/26/20  8:33 PM  Result Value Ref Range  RPR Ser Ql NON REACTIVE NON REACTIVE    Comment: Performed at [facility] [address] [place], [place] [address]  Glucose, capillary     Status: Abnormal   Collection Time: 04/26/20  9:29 PM  Result Value Ref Range   Glucose-Capillary 100 (H) 70 - 99 mg/dL    Comment: Glucose reference range applies only to samples taken  after fasting for at least 8 hours.  Procalcitonin     Status: None   Collection Time: 04/27/20  2:01 AM  Result Value Ref Range   Procalcitonin 0.22 ng/mL    Comment:        Interpretation: PCT (Procalcitonin) <= 0.5 ng/mL: Systemic infection (sepsis) is not likely. Local bacterial infection is possible. (NOTE)       Sepsis PCT Algorithm           Lower Respiratory Tract                                      Infection PCT Algorithm    ----------------------------     ----------------------------         PCT < 0.25 ng/mL                PCT < 0.10 ng/mL          Strongly encourage             Strongly discourage   discontinuation of antibiotics    initiation of antibiotics    ----------------------------     -----------------------------       PCT 0.25 - 0.50 ng/mL            PCT 0.10 - 0.25 ng/mL               OR       >80% decrease in PCT            Discourage initiation of                                            antibiotics      Encourage discontinuation           of antibiotics    ----------------------------     -----------------------------         PCT >= 0.50 ng/mL              PCT 0.26 - 0.50 ng/mL               AND        <80% decrease in PCT             Encourage initiation of                                             antibiotics       Encourage continuation           of antibiotics    ----------------------------     -----------------------------        PCT >= 0.50 ng/mL                  PCT > 0.50 ng/mL  AND         increase in PCT                  Strongly encourage                                      initiation of antibiotics    Strongly encourage escalation           of antibiotics                                     -----------------------------                                           PCT <= 0.25 ng/mL                                                 OR                                        > 80% decrease in PCT                                       Discontinue / Do not initiate                                             antibiotics  Performed at [facility], [address] [place], [place] [address]   CBC     Status: Abnormal   Collection Time: 04/27/20  2:01 AM  Result Value Ref Range   WBC 23.3 (H) 4.0 - 10.5 K/uL   RBC 2.98 (L) 4.22 - 5.81 MIL/uL   Hemoglobin 7.9 (L) 13.0 - 17.0 g/dL   HCT 25.4 (L) 39 - 52 %   MCV 85.2 80.0 - 100.0 fL   MCH 26.5 26.0 - 34.0 pg   MCHC 31.1 30.0 - 36.0 g/dL   RDW 15.0 11.5 - 15.5 %   Platelets 318 150 - 400 K/uL   nRBC 0.0 0.0 - 0.2 %    Comment: Performed at [facility], [place] [address] [place], [place] [address]  Comprehensive metabolic panel     Status: Abnormal   Collection Time: 04/27/20  2:01 AM  Result Value Ref Range   Sodium 136 135 - 145 mmol/L   Potassium 3.6 3.5 - 5.1 mmol/L   Chloride 100 98 - 111 mmol/L   CO2 26 22 - 32 mmol/L   Glucose, Bld 91 70 - 99 mg/dL    Comment: Glucose reference range applies only to samples taken after fasting for at least 8 hours.   BUN 25 (H) 8 - 23 mg/dL   Creatinine, Ser 1.12 0.61 - 1.24 mg/dL   Calcium 8.4 (L) 8.9 - 10.3 mg/dL  Total Protein 5.2 (L) 6.5 - 8.1 g/dL   Albumin 2.2 (L) 3.5 - 5.0 g/dL   AST 25 15 - 41 U/L   ALT 49 (H) 0 - 44 U/L   Alkaline Phosphatase 65 38 - 126 U/L   Total Bilirubin 0.6 0.3 - 1.2 mg/dL   GFR, Estimated >60 >60 mL/min    Comment: (NOTE) Calculated using the CKD-EPI Creatinine Equation (2021)    Anion gap 10 5 - 15    Comment: Performed at [facility] [address] [place], [place] [address]  Lactic acid, plasma     Status: None   Collection Time: 04/27/20  8:00 AM  Result Value Ref Range   Lactic Acid, Venous 0.8 0.5 - 1.9 mmol/L    Comment: Performed at [facility] [address] [place], [place] [address]  Glucose, capillary     Status: None   Collection Time: 04/27/20  8:36 AM  Result Value Ref Range   Glucose-Capillary 94 70 - 99 mg/dL    Comment:  Glucose reference range applies only to samples taken after fasting for at least 8 hours.  Glucose, capillary     Status: None   Collection Time: 04/27/20 12:28 PM  Result Value Ref Range   Glucose-Capillary 75 70 - 99 mg/dL    Comment: Glucose reference range applies only to samples taken after fasting for at least 8 hours.    DG Chest Port 1 View  Result Date: 04/26/2020 CLINICAL DATA:  Questionable sepsis EXAM: PORTABLE CHEST 1 VIEW COMPARISON:  Portable exam 1410 hours compared to 01/16/2020 FINDINGS: RIGHT arm PICC line with tip projecting over RIGHT atrium. Upper normal heart size. Mediastinal contours and pulmonary vascularity normal. Atherosclerotic calcification aorta. Mild RIGHT basilar atelectasis. Skin fold projects over lower RIGHT lung. No infiltrate, pleural effusion or pneumothorax. IMPRESSION: Mild RIGHT basilar atelectasis. Electronically Signed   By: Lavonia Dana M.D.   On: 04/26/2020 14:24   DG Foot Complete Right  Result Date: 04/26/2020 CLINICAL DATA:  Right heel abscess for an unknown period of time. EXAM: RIGHT FOOT COMPLETE - 3+ VIEW COMPARISON:  Right foot radiographs dated 01/16/2020 FINDINGS: There is no evidence of fracture or dislocation. There is a skin defect overlying the heel which appears to extend to the inferior/posterior surface of the calcaneus. There is a focal area of osseous demineralization along the posterior and inferior aspect of the calcaneus, concerning for osteomyelitis. IMPRESSION: 1. Skin defect overlying the heel which appears to extend to the inferior/posterior surface of the calcaneus where there is a focal area of osseous demineralization, concerning for osteomyelitis. Electronically Signed   By: Zerita Boers M.D.   On: 04/26/2020 15:34    Review of Systems  Unable to perform ROS: Patient nonverbal   Blood pressure (!) 114/95, pulse 89, temperature 97.8 F (36.6 C), temperature source Oral, resp. rate 18, weight 63.5 kg, SpO2 100  %. Physical Exam Constitutional:      General: He is not in acute distress.    Appearance: He is well-developed. He is not diaphoretic.  HENT:     Head: Normocephalic and atraumatic.  Eyes:     General: No scleral icterus.       Right eye: No discharge.        Left eye: No discharge.     Conjunctiva/sclera: Conjunctivae normal.  Cardiovascular:     Rate and Rhythm: Normal rate and regular rhythm.  Pulmonary:     Effort: Pulmonary effort  is normal. No respiratory distress.  Musculoskeletal:     Cervical back: Normal range of motion.     Comments: Large necrotic decubitus ulcer left troch  Feet:     Comments: Full thickness ulcerations bilateral heels with necrosis and purulent discharge. Pain with manipulation though pt has hard time articulating where (or at all) Skin:    General: Skin is warm and dry.  Neurological:     Mental Status: He is alert.  Psychiatric:        Behavior: Behavior normal.             Assessment/Plan: Multiple decubiti -- Recommend general surgical consultation for the sacral wound. 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 As such would recommend bilateral BKA's Will get ABI as pulses were very faint; if abnormal would need vascular surgical consultation (and they may perform amputations if he needs revascularization if they prefer). The left hip wound could use surgical debridement if he's going to the OR for another reason. We will follow along. Multiple medical problems including prior stroke, dysphagia, CLL, prior left femoral fracture, major depressive disorder, hypertension and type 2 diabetes -- per primary service     Lisette Abu, PA-C Orthopedic Surgery [phone] 04/27/2020, 1:32 PM

## 2020-04-27 NOTE — Progress Notes (Signed)
Initial Nutrition Assessment  DOCUMENTATION CODES:   Not applicable  INTERVENTION:   Ensure Enlive po TID, each supplement provides 350 kcal and 20 grams of protein  MVI daily   NUTRITION DIAGNOSIS:   Increased nutrient needs related to wound healing as evidenced by estimated needs.    GOAL:   Patient will meet greater than or equal to 90% of their needs    MONITOR:   PO intake, Supplement acceptance, Skin, Weight trends, Diet advancement, Labs  REASON FOR ASSESSMENT:   Consult Assessment of nutrition requirement/status  ASSESSMENT:   Pt from SNF admitted with sepsis 2/2 multiple pressure injuries, concerning for osteomyelitis. PMH includes prior stroke, dysphagia, CLL, prior L femur fracture, depression, HTN, type 2 DM.  Pt unavailable at time of RD visit. Pt noted to be slow to communicate.   Pt receiving IV antibiotics and will have orthopedics evaluate him for surgery.   No PO intake documented at this time. RD will order oral nutrition supplements to maximize pt's po intake as his protein/calorie needs are elevated due to wound healing.   UOP: 2748ml x24 hours  Labs: CBGs 75-100 Medications: ss Novolog, 20 units Levemir, Protonix  Diet Order:   Diet Order            DIET - DYS 1 Room service appropriate? No; Fluid consistency: Thin  Diet effective now                 EDUCATION NEEDS:   No education needs have been identified at this time  Skin:  Skin Assessment: Skin Integrity Issues: Skin Integrity Issues:: Stage I, Stage II, Stage III, Stage IV Stage I: sacrum Stage II: R hip Stage III: L hip Stage IV: coccyx  Last BM:  10/28  Height:   Ht Readings from Last 1 Encounters:  01/20/20 5\' 8"  (1.727 m)    Weight:   Wt Readings from Last 1 Encounters:  04/26/20 63.5 kg    BMI:  Body mass index is 21.29 kg/m.  Estimated Nutritional Needs:   Kcal:  1800-2000  Protein:  90-105 grams  Fluid:  >/= 1.8L/d    Larkin Ina,  MS, RD, LDN RD pager number and weekend/on-call pager number located in [place].

## 2020-04-27 NOTE — Plan of Care (Signed)
°  Problem: Fluid Volume: Goal: Hemodynamic stability will improve Outcome: Not Progressing   Problem: Clinical Measurements: Goal: Diagnostic test results will improve Outcome: Not Progressing Goal: Signs and symptoms of infection will decrease Outcome: Not Progressing   Problem: Respiratory: Goal: Ability to maintain adequate ventilation will improve Outcome: Not Progressing

## 2020-04-28 LAB — COMPREHENSIVE METABOLIC PANEL
ALT: 36 U/L (ref 0–44)
AST: 25 U/L (ref 15–41)
Albumin: 2.1 g/dL — ABNORMAL LOW (ref 3.5–5.0)
Alkaline Phosphatase: 64 U/L (ref 38–126)
Anion gap: 11 (ref 5–15)
BUN: 21 mg/dL (ref 8–23)
CO2: 25 mmol/L (ref 22–32)
Calcium: 8.3 mg/dL — ABNORMAL LOW (ref 8.9–10.3)
Chloride: 102 mmol/L (ref 98–111)
Creatinine, Ser: 1.24 mg/dL (ref 0.61–1.24)
GFR, Estimated: 59 mL/min — ABNORMAL LOW (ref >60)
Glucose, Bld: 66 mg/dL — ABNORMAL LOW (ref 70–99)
Potassium: 3.9 mmol/L (ref 3.5–5.1)
Sodium: 138 mmol/L (ref 135–145)
Total Bilirubin: 0.6 mg/dL (ref 0.3–1.2)
Total Protein: 5.2 g/dL — ABNORMAL LOW (ref 6.5–8.1)

## 2020-04-28 LAB — CBC WITH DIFFERENTIAL/PLATELET
Abs Immature Granulocytes: 0.2 10*3/uL — ABNORMAL HIGH (ref 0.00–0.07)
Basophils Absolute: 0.1 10*3/uL (ref 0.0–0.1)
Basophils Relative: 0 %
Eosinophils Absolute: 0.7 10*3/uL — ABNORMAL HIGH (ref 0.0–0.5)
Eosinophils Relative: 3 %
HCT: 24 % — ABNORMAL LOW (ref 39.0–52.0)
Hemoglobin: 7.4 g/dL — ABNORMAL LOW (ref 13.0–17.0)
Immature Granulocytes: 1 %
Lymphocytes Relative: 42 %
Lymphs Abs: 8.9 10*3/uL — ABNORMAL HIGH (ref 0.7–4.0)
MCH: 26.6 pg (ref 26.0–34.0)
MCHC: 30.8 g/dL (ref 30.0–36.0)
MCV: 86.3 fL (ref 80.0–100.0)
Monocytes Absolute: 0.4 10*3/uL (ref 0.1–1.0)
Monocytes Relative: 2 %
Neutro Abs: 10.8 10*3/uL — ABNORMAL HIGH (ref 1.7–7.7)
Neutrophils Relative %: 52 %
Platelets: 303 10*3/uL (ref 150–400)
RBC: 2.78 MIL/uL — ABNORMAL LOW (ref 4.22–5.81)
RDW: 15.3 % (ref 11.5–15.5)
WBC: 21 10*3/uL — ABNORMAL HIGH (ref 4.0–10.5)
nRBC: 0 % (ref 0.0–0.2)

## 2020-04-28 LAB — GLUCOSE, CAPILLARY
Glucose-Capillary: 71 mg/dL (ref 70–99)
Glucose-Capillary: 266 mg/dL — ABNORMAL HIGH (ref 70–99)
Glucose-Capillary: 185 mg/dL — ABNORMAL HIGH (ref 70–99)
Glucose-Capillary: 251 mg/dL — ABNORMAL HIGH (ref 70–99)

## 2020-04-28 LAB — VANCOMYCIN, TROUGH: Vancomycin Tr: 15 ug/mL (ref 15–20)

## 2020-04-28 LAB — HEMOGLOBIN A1C
Hgb A1c MFr Bld: 8 % — ABNORMAL HIGH (ref 4.8–5.6)
Mean Plasma Glucose: 182.9 mg/dL

## 2020-04-28 MED ORDER — INSULIN ASPART 100 UNIT/ML ~~LOC~~ SOLN
0.0000 [IU] | Freq: Three times a day (TID) | SUBCUTANEOUS | Status: DC
  Administered 2020-04-28: 2 [IU] via SUBCUTANEOUS
  Administered 2020-04-29: 3 [IU] via SUBCUTANEOUS

## 2020-04-28 MED ORDER — DEXTROSE 50 % IV SOLN
25.0000 mL | Freq: Once | INTRAVENOUS | Status: AC
  Administered 2020-04-28: 25 mL via INTRAVENOUS
  Filled 2020-04-28: qty 50

## 2020-04-28 MED ORDER — LACTATED RINGERS IV SOLN: INTRAVENOUS | Status: AC

## 2020-04-28 NOTE — Plan of Care (Signed)

## 2020-04-28 NOTE — Progress Notes (Signed)
  Date: 04/28/2020  Patient name: Hayden [name]  Medical record number: [id number]  Date of birth: 04/16/1941   This patient's plan of care was discussed with the house staff. Please see Dr. Philip Aspen note for complete details. I concur with their findings.   Sid Falcon, MD 04/28/2020, 7:40 PM

## 2020-04-28 NOTE — Progress Notes (Signed)
HD#2 Subjective:   No acute events overnight.  During rounds this morning, patient was oriented to self, able to state his name loudly and clearly. Did not respond to additional orientation questions but rather stared blankly. When his brother [name] and sister [name] were mentioned, he was more responsive. When informed of the orthopedic surgeon evaluation and discussion of palliative care vs surgery broached, patient did not respond. When asked if he needs more time to think he states, "that would be good." States he is not in any pain. Endorses mild pruritis of bilateral upper extremities.  Spoke with patient's sister, [name], after rounds to give her update on today's exam. Informed her that orthopedic surgery would not perform surgery until early in the week at the earliest. Discussed option of involving palliative care in patient's goals of care. Sister stated she is supportive of palliative care's involvement. Reached patient's brother, [name], however connection was spotty and unable to fully engage in conversation. Will attempt later.  Objective:   Vital signs in last 24 hours: Vitals:   04/26/20 2031 04/27/20 0603 04/27/20 1350 04/27/20 2119  BP: 122/79 (!) 114/95 125/68 107/68  Pulse: (!) 102 89 94 91  Resp: 20 18 20 18   Temp: 98 F (36.7 C) 97.8 F (36.6 C) 97.9 F (36.6 C) 98.5 F (36.9 C)  TempSrc:  Oral  Axillary  SpO2: 90% 100% 98% 100%  Weight:       Physical Exam Constitutional: chronically ill-appearing gentleman laying in bed, in no acute distress, lying in bed on his right side, awake and alert Cardiovascular: regular rate and rhythm, no m/r/g Pulmonary/Chest: normal work of breathing on room air, lungs clear to auscultation bilaterally Neurological: oriented to self, does not answer additional orientation questions Skin: Diffuse macular erythematous lacy rash of bilateral upper extremities slightly more erythematous compared to yesterday, improved over lower  extremities, wound dressings c/d/i Psych: speech is loud and clear; answers are delayed   Pertinent Labs: CBC Latest Ref Rng & Units 04/28/2020 04/27/2020 04/26/2020  WBC 4.0 - 10.5 K/uL 21.0(H) 23.3(H) 23.6(H)  Hemoglobin 13.0 - 17.0 g/dL 7.4(L) 7.9(L) 8.9(L)  Hematocrit 39 - 52 % 24.0(L) 25.4(L) 29.8(L)  Platelets 150 - 400 K/uL 303 318 365    CMP Latest Ref Rng & Units 04/28/2020 04/27/2020 04/26/2020  Glucose 70 - 99 mg/dL 66(L) 91 315(H)  BUN 8 - 23 mg/dL 21 25(H) 32(H)  Creatinine 0.61 - 1.24 mg/dL 1.24 1.12 1.25(H)  Sodium 135 - 145 mmol/L 138 136 132(L)  Potassium 3.5 - 5.1 mmol/L 3.9 3.6 4.0  Chloride 98 - 111 mmol/L 102 100 96(L)  CO2 22 - 32 mmol/L 25 26 26   Calcium 8.9 - 10.3 mg/dL 8.3(L) 8.4(L) 8.5(L)  Total Protein 6.5 - 8.1 g/dL 5.2(L) 5.2(L) 5.4(L)  Total Bilirubin 0.3 - 1.2 mg/dL 0.6 0.6 0.4  Alkaline Phos 38 - 126 U/L 64 65 77  AST 15 - 41 U/L 25 25 37  ALT 0 - 44 U/L 36 49(H) 66(H)    Assessment/Plan:   Principal Problem:   Sepsis ([place]) Active Problems:   Type 2 diabetes mellitus without complication, with long-term current use of insulin (HCC)   Pressure injury of skin   Patient Summary:  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  This is hospital day 2.  Sepsis in the setting of multiple wounds Bilateral heel wounds concerning for osteomyelitis Multiple decubiti  Patient evaluated by orthopedic surgery yesterday. 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 Planning to obtain ABI since patient's pulses were faint and would recommend vascular surgery consultation if abnormal. Recommend general surgery  consultation for patient's sacral wound. Also note that the patient's left hip wound could benefit from surgical debridement if he is in the OR for another reason.   This morning, patient continues to be oriented to self only, however he is more awake, alert, and responsive. Treatment options shared with him, however difficult to know how much patient was able to understand. States he needs more time to think about his options.  Vitals are stable. WBC improved to 21 from 23. Blood cultures no growth <24 hours.   Per conversation with patient's family, will consult palliative care to facilitate goals of care conversations. In the meantime, will continue IV antibiotic treatment. Family would like to be informed regarding treatment options and hopeful that patient can be actively involved in his treatment. - Orthopedic surgery consulted, appreciate recs  - BKA indicated for definitive treatment if aligns with patient and family's wishes  - Dr. [name] would see patient no earlier than Monday  - Recommend gen surg consultation for sacral wound - Wound care consulted, recommend daily dressing changes - Continue vancomycin and cefepime (Day 1: 10/28) - Pain control: Tylenol 650 q6h and oxy 5 mg q8h   Fungal infection of the groin perianal area RPR negative - Continue fluconazole and nystatin powder with barrier cream  AKI sCr 1.24 this morning, up from 1.12 yesterday. 1.25 on admission. Baseline 0.7. - LR @ 75 cc/hr for 8 hours - Continue to monitor  Type 2 diabetes mellitus Home regimen insulin detemir 24 units daily and insulin aspart 6 units 3 times daily with meals. Held on admission. Fasting BG 71 this morning, patient did not get insulin detemir 20 u last evening, gave 25 mL D50 given patient's poor PO. BG 271 thereafter. Will add back SSI and assess needs.  - SSI  Major depressive disorder - Continue home risperidone 1 mg twice daily - Continue home zoloft 50 mg daily  History of  stroke Dysphagia Patient on dysphagia 3 diet per SNF records. - Dysphagia 1 diet per SLP evaluation - Nutrition consulted, appreciate recs  History of pulmonary emboli - Continue home Xarelto 20 mg daily   Diet: Dysphagia 3 IVF: LR,75cc/hr VTE: Xarelto Code: DNR    Please contact the on call pager after 5 pm and on weekends at [phone].  Alexandria Lodge, MD PGY-1 Internal Medicine Teaching Service Pager: [phone] 04/28/2020

## 2020-04-28 NOTE — Progress Notes (Signed)
Pharmacy Antibiotic Note  Hayden [name] is a 79 y.o. male admitted on 04/26/2020 with sepsis.  Pharmacy has been consulted for vancomycin and cefepime dosing. Pt also on flagyl per primary. Pt has chronic wounds on bilateral heels, scram, and hip. He has a rash on his neck, arms, back, upper legs as well. Pt has been on chronic vancomycin therapy but it was held x1 wks prior to this admission for unknown reason.   Vancomycin trough therapeutic = 15, drawn somewhat late.  SCr 1.24 fluctuating, UOP 0.5 ml/kg/hr  Plan: Continue vancomycin 500 mg every 12 hours Monitor renal function, LOT and BKA plans next week  Weight: 63.5 kg (139 lb 15.9 oz)  Temp (24hrs), Avg:98.6 F (37 C), Min:98.5 F (36.9 C), Max:98.6 F (37 C)  Recent Labs  Lab 04/26/20 1354 04/26/20 1500 04/27/20 0201 04/27/20 0800 04/28/20 0500 04/28/20 1530  WBC 23.6*  --  23.3*  --  21.0*  --   CREATININE 1.25*  --  1.12  --  1.24  --   LATICACIDVEN 2.6* 2.5*  --  0.8  --   --   VANCOTROUGH  --   --   --   --   --  15    Estimated Creatinine Clearance: 42.7 mL/min (by C-G formula based on SCr of 1.24 mg/dL).    Allergies  Allergen Reactions  . Bee Venom Other (See Comments)    Bump where stung and took days to go away  . Glucophage [Metformin] Diarrhea    "I will never take it again - stool incontinent"  . Shellfish Allergy Swelling  . Grass Extracts [Gramineae Pollens] Other (See Comments)    Unknown rxn  . Tobacco [Tobacco] Other (See Comments)    Unknown rxn    Antimicrobials this admission: vanc 10/28> Cefepime 10/28> Flagyl x1 10/28    Microbiology results: 10/28 Bcx ordered 10/28 UA, UCx ordered  Thank you for allowing pharmacy to be a part of this patient's care.  Bertis Ruddy, PharmD Clinical Pharmacist ED Pharmacist Phone # [phone] 04/28/2020 4:37 PM

## 2020-04-28 NOTE — Hospital Course (Addendum)
LOS: 2 79 yo with CLL, b/l hip fractures, prior stroke, htn, DM admitted 04/26/20 for sepsis. Noted to have  stage IV pressure wound of the right heel, unstageable DTI of the left heel, stage IV pressure wound of the sacrum, unstageable DTI of the right first toe, shear wound of the right hip, unstageable DTI of the left medial first toe, unstageable DTI of the right plantar foot and unstageable DTI of the left plantar foot. PICC placed 8/24 and started on vanc/zosyn.  Resides at [facility].  Admission labs: lactate 2.6, Cr. 1.25, WBC 24, hgb 8.9, INR 3.4. Admission imaging: Right foot xray concerning for osteo.  ABX Vancomycin 8/24? >> Cefepime 10/28>>  Micro: 10/28 BC>>NGTD  Events: 10/28 admission.  10/29 ortho consulted--recommended b/l AKA but don't want to due to comorbiities--Hayden [name] will see next week  Assessment/Plan  Sepsis secondary to decubitus ulcers involving right heel, left heel, sacrum, right first toe, left first toe, right plantar foot, left plantar foot --WBC 24>23>21 --lactate 2.6>0.8 Stool incontinence-will need to consider diverting colostomy Plan --cefepime/vanc --wound care --pain management:oxy 5mg  tid  Fungal infection involving groin. Continue fluconazole and nystatin.  AKI. resolved  T2DM. Decreased insulin requirement in setting of poor nutrition. A1C 6.7 in July.  Anemia--borderline microcytic. Hgb 8.9>7.9>7.4. hgb 12-13 in July -positive stool occult in 2020. No recent colonoscopy CLL: baseline WBC seems to be in low teens  Elevated INR. 3.4 on admission.  History of CVA: dysphagia 1  Hx of PE. Continue xarelto. HLD: continue statin  Family Communication: team discussed full scope of care vs palliation. Family pursuing full scope at this time however is DNR. --What is their goal?  Barrier to D/C: can he follow up with Hayden [name] outpatient? He has picc needed for IV abx. No long term bed available. CMM consulted. PICC not working great

## 2020-04-29 DIAGNOSIS — A419 Sepsis, unspecified organism: Principal | ICD-10-CM

## 2020-04-29 DIAGNOSIS — M86071 Acute hematogenous osteomyelitis, right ankle and foot: Secondary | ICD-10-CM

## 2020-04-29 DIAGNOSIS — Z794 Long term (current) use of insulin: Secondary | ICD-10-CM

## 2020-04-29 DIAGNOSIS — E119 Type 2 diabetes mellitus without complications: Secondary | ICD-10-CM

## 2020-04-29 DIAGNOSIS — Z515 Encounter for palliative care: Secondary | ICD-10-CM

## 2020-04-29 LAB — URINALYSIS, ROUTINE W REFLEX MICROSCOPIC
Bilirubin Urine: NEGATIVE
Glucose, UA: 150 mg/dL — AB
Hgb urine dipstick: NEGATIVE
Ketones, ur: NEGATIVE mg/dL
Leukocytes,Ua: NEGATIVE
Nitrite: NEGATIVE
Protein, ur: NEGATIVE mg/dL
Specific Gravity, Urine: 1.014 (ref 1.005–1.030)
pH: 5 (ref 5.0–8.0)

## 2020-04-29 LAB — CBC
HCT: 22.2 % — ABNORMAL LOW (ref 39.0–52.0)
Hemoglobin: 6.8 g/dL — CL (ref 13.0–17.0)
MCH: 27.1 pg (ref 26.0–34.0)
MCHC: 30.6 g/dL (ref 30.0–36.0)
MCV: 88.4 fL (ref 80.0–100.0)
Platelets: 271 10*3/uL (ref 150–400)
RBC: 2.51 MIL/uL — ABNORMAL LOW (ref 4.22–5.81)
RDW: 15.5 % (ref 11.5–15.5)
WBC: 15.3 10*3/uL — ABNORMAL HIGH (ref 4.0–10.5)
nRBC: 0 % (ref 0.0–0.2)

## 2020-04-29 LAB — GLUCOSE, CAPILLARY
Glucose-Capillary: 214 mg/dL — ABNORMAL HIGH (ref 70–99)
Glucose-Capillary: 191 mg/dL — ABNORMAL HIGH (ref 70–99)
Glucose-Capillary: 179 mg/dL — ABNORMAL HIGH (ref 70–99)

## 2020-04-29 LAB — HEMOGLOBIN AND HEMATOCRIT, BLOOD
HCT: 27.7 % — ABNORMAL LOW (ref 39.0–52.0)
Hemoglobin: 8.9 g/dL — ABNORMAL LOW (ref 13.0–17.0)

## 2020-04-29 LAB — PREPARE RBC (CROSSMATCH)

## 2020-04-29 MED ORDER — SENNA 8.6 MG PO TABS
1.0000 | ORAL_TABLET | Freq: Two times a day (BID) | ORAL | Status: DC
  Administered 2020-04-29 – 2020-04-30 (×2): 8.6 mg via ORAL
  Filled 2020-04-29 (×3): qty 1

## 2020-04-29 MED ORDER — LACTATED RINGERS IV SOLN: INTRAVENOUS | Status: DC

## 2020-04-29 MED ORDER — MORPHINE SULFATE (CONCENTRATE) 10 MG/0.5ML PO SOLN
10.0000 mg | Freq: Four times a day (QID) | ORAL | Status: DC
  Administered 2020-04-29 – 2020-04-30 (×5): 10 mg via SUBLINGUAL
  Filled 2020-04-29 (×6): qty 0.5

## 2020-04-29 MED ORDER — MORPHINE SULFATE (CONCENTRATE) 10 MG/0.5ML PO SOLN
10.0000 mg | ORAL | Status: DC | PRN
  Administered 2020-04-30 (×4): 10 mg via ORAL
  Filled 2020-04-29 (×3): qty 0.5

## 2020-04-29 MED ORDER — SODIUM CHLORIDE 0.9% IV SOLUTION: Freq: Once | INTRAVENOUS | Status: AC

## 2020-04-29 MED ORDER — INSULIN ASPART 100 UNIT/ML ~~LOC~~ SOLN
0.0000 [IU] | Freq: Three times a day (TID) | SUBCUTANEOUS | Status: DC
  Administered 2020-04-29: 3 [IU] via SUBCUTANEOUS

## 2020-04-29 MED ORDER — PNEUMOCOCCAL VAC POLYVALENT 25 MCG/0.5ML IJ INJ: 0.5000 mL | INJECTION | INTRAMUSCULAR | Status: DC

## 2020-04-29 MED ORDER — INFLUENZA VAC A&B SA ADJ QUAD 0.5 ML IM PRSY
0.5000 mL | PREFILLED_SYRINGE | INTRAMUSCULAR | Status: DC
  Filled 2020-04-29: qty 0.5

## 2020-04-29 NOTE — Progress Notes (Addendum)
HD#3 Subjective:   No significant overnight events He was minimally verbal this morning--able to say "Hi" and that he was not in pain however did not respond to further questions.  Objective:   Vital signs in last 24 hours: Vitals:   04/27/20 1350 04/27/20 2119 04/28/20 1443 04/28/20 2123  BP: 125/68 107/68 (!) 112/46 (!) 111/50  Pulse: 94 91 86 96  Resp: 20 18 18 16   Temp: 97.9 F (36.6 C) 98.5 F (36.9 C) 98.6 F (37 C) 97.8 F (36.6 C)  TempSrc:  Axillary  Oral  SpO2: 98% 100% 97% 94%  Weight:       Physical Exam Blood pressure (!) 111/50, pulse 96, temperature 97.8 F (36.6 C), temperature source Oral, resp. rate 16, weight 63.5 kg, SpO2 94 %.  General: chronically ill appearing Cardiac: RRR. No LE edema. Pulm: lungs clear bilaterally Skin: decubitus ulcers involving the bilateral heels, left hip and sacrum. Dressing clean and intact. Unable to visualize sacral ulcer due to patient's pain with repositioning.    Pertinent Labs: CBC Latest Ref Rng & Units 04/28/2020 04/27/2020 04/26/2020  WBC 4.0 - 10.5 K/uL 21.0(H) 23.3(H) 23.6(H)  Hemoglobin 13.0 - 17.0 g/dL 7.4(L) 7.9(L) 8.9(L)  Hematocrit 39 - 52 % 24.0(L) 25.4(L) 29.8(L)  Platelets 150 - 400 K/uL 303 318 365    CMP Latest Ref Rng & Units 04/28/2020 04/27/2020 04/26/2020  Glucose 70 - 99 mg/dL 66(L) 91 315(H)  BUN 8 - 23 mg/dL 21 25(H) 32(H)  Creatinine 0.61 - 1.24 mg/dL 1.24 1.12 1.25(H)  Sodium 135 - 145 mmol/L 138 136 132(L)  Potassium 3.5 - 5.1 mmol/L 3.9 3.6 4.0  Chloride 98 - 111 mmol/L 102 100 96(L)  CO2 22 - 32 mmol/L 25 26 26   Calcium 8.9 - 10.3 mg/dL 8.3(L) 8.4(L) 8.5(L)  Total Protein 6.5 - 8.1 g/dL 5.2(L) 5.2(L) 5.4(L)  Total Bilirubin 0.3 - 1.2 mg/dL 0.6 0.6 0.4  Alkaline Phos 38 - 126 U/L 64 65 77  AST 15 - 41 U/L 25 25 37  ALT 0 - 44 U/L 36 49(H) 66(H)    Assessment/Plan:   Principal Problem:   Sepsis ([place]) Active Problems:   Type 2 diabetes mellitus without complication, with  long-term current use of insulin (HCC)   Pressure injury of skin   Patient Summary:  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   Sepsis secondary to infection of multiple pressure ulcers Sepsis resolved. Hemodynamically stable and afebrile over the past 24h. Lactate 2.5 on admission > 0.8 10/29.  -NGTD on blood cultures Bilateral heel wounds concerning for osteomyelitis -Ortho consulted--recommending bilateral BKA however hesitant due to multiple comorbidities. Deferring options to Dr. [name] who will see sometime in the next week. Sacral, left hip decubitus ulcers,  -Recommend general surgery consultation for patient's sacral wound. Also note that the patient's left hip wound could benefit from surgical debridement if he is in the OR for another reason.  Plan -- Wound care. Dressing changes, air mattress, Frequent repositioning -- Continue vancomycin and cefepime. Will need 6w course of antibiotics. -- Pain control: Tylenol 650 q6h and oxy 5 mg q8h  -- ABIs ordered to assess for healing ability  --F/u recommendations from Dr. [name] tomorrow  Anemia--borderline microcytic. Hgb 8.9>7.9>7.4>6.8. hgb  12-13 in July. Suspect that this is related to current infection and decreased production.  -positive stool occult in 2020. No recent colonoscopy so can not exclude malignancy -he is currently on xarelto however clinical findings are not suggestive of an acute bleed requiring this to be stopped. Plan --transfuse 1U PRBC with 2h post transfusion h/h. Transfusion goal >7. --will obtain iron panel and retic count in AM. --will need outpatient GI workup  Fungal infection of the groin perianal area - Continue fluconazole and  nystatin powder with barrier cream  AKI. Renal function relatively stable. BMP in AM  Type 2 diabetes mellitus Home regimen insulin detemir 24 units daily and insulin aspart 6 units 3 times daily with meals. Held on admission. Glucoses above goal. Currently on sensitive SSI.  Plan: increase SSI to moderate. Continue to monitor.  Major depressive disorder - Continue home risperidone 1 mg twice daily - Continue home zoloft 50 mg daily  History of stroke Dysphagia Patient on dysphagia 3 diet per SNF records. - Dysphagia 1 diet per SLP evaluation - Nutrition consulted, appreciate recs  History of pulmonary emboli - Continue home Xarelto 20 mg daily   Diet: dysphagia 1 VTE: Xarelto Code: DNR  D/C disposition: further workup for anemia requiring blood transfusion, awaiting evaluation by Dr. [name].   Addendum #1 1:53 PM : spoke with Audrea Muscat, NP from palliative care who had a lengthy discussion with pt's POA. POA has decided to pursue hospice care at this time.     Please contact the on call pager after 5 pm and on weekends at [phone].  Mitzi Hansen, MD Internal Medicine Resident PGY-2 [facility] Pager: [phone] 04/29/2020 5:03 AM

## 2020-04-29 NOTE — Progress Notes (Signed)
2311 Notified Internal medicine regarding low blood pressure 67/34 HR 100,awaiting return call.  2317 Orders received for Comfort care.  2325 New orders received for LR @ 75 ml/hr.

## 2020-04-29 NOTE — Consult Note (Addendum)
Consultation Note Date: 04/29/2020   Patient Name: Hayden [name]  DOB: [birth year]  MRN: [id number]  Age / Sex: 79 y.o., male  PCP: Patient, No Pcp Per Referring Physician: [name], DO  Reason for Consultation: Establishing goals of care  HPI/Patient Profile: 79 y.o. male  with past medical history of vascular dementia, CLL, dysphagia, DM, fibromyalgia, CVA, dysphagia, PE on Xeralto, multiple wounds on sacrum, right hip and bilateral lower extremity wounds who was admitted on 04/26/2020 with an elevated white count.  (23).  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  He was also found to have a hemoglobin of 6.8 and required transfusion.  His albumin is particularly low at 2.1.  Clinical Assessment and Goals of Care:  I have reviewed medical records including EPIC notes, labs and imaging, received report from , examined the patient and spoke to his H C POA, brother, Hayden [name] on the telephone to discuss diagnosis prognosis, Hayden [name], EOL wishes, disposition and options.  Hayden [name] [name] in [place].  I introduced Palliative Medicine as specialized medical care for people living with serious illness. It focuses on providing relief from the symptoms and stress of a serious illness. We discussed a brief life review of the patient.  Hayden [name] is married.  His wife also has memory care issues and lives in the same facility where he [name].  He has 2 sons both of which are estranged.  Hayden [name] was a brilliant man.  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  He had a ham radio license at 81 years old.  His brother Hayden [name] tells me that the 2 of them were never close.  They had a very difficult childhood, being raised by a mother with Alzheimer's dementia.  As far as functional  and nutritional status   We discussed her current illness and what it means in the larger context of her on-going co-morbidities.  Natural disease trajectory and expectations at EOL were discussed.  Hayden [name] tells me that he became Hayden [name] POA because the patient's sons deferred to him and Hayden [name] asked him to do it.  Despite Hayden [name] telling me that he and Hayden [name] were never close, he cares very deeply about making the right choices for his brother.  He tells me with great concern that the surgery does not make sense to him.  It does not seem reasonable to cut off both of Hayden [name]'s legs when he may not even understand what is happening.  Hayden [name] expresses concern that Hayden [name] may not recover from such a surgery.  Hayden [name] has researched it, talked to several of Hayden [name] friends and reached the decision that he does not want his brother to go through bilateral BKA.  I supported Hayden [name] decision indicating that I felt the patient would probably have postop delirium, and healing complications as his albumin is so low.  I agreed with Hayden [name] that Hayden [name] may never recover from such  a significant surgery.  Advanced directives, concepts specific to code status, artifical feeding and hydration, and rehospitalization were considered and discussed.  Hayden [name] is DNR/DNI.  Hayden [name] felt the best approach from this point on is to focus on Hayden [name]'s comfort and happiness.  Ideally he would like to place Hayden [name] somewhere where he does not feel lonely and where he will not suffer.  I reassured Hayden [name] that we wanted the same for his brother. Hospice and Palliative Care services were explained and offered.  Questions and concerns were addressed.  The family was encouraged to call with questions or concerns.    Primary Decision Maker:  Hayden [name], HCPOA    SUMMARY OF RECOMMENDATIONS     No orthopedic surgery  Focus on comfort.  We will schedule morphine concentrate on a regular basis and evaluate symptoms sadly Hayden [name] understands that his  brother will die from this infection  Per family request a bed at [facility], hospice facility.  Code Status/Advance Care Planning:  DNR   Symptom Management:   Scheduled morphine with as needed available  Additional Recommendations (Limitations, Scope, Preferences):  Full Comfort Care, No Artificial Feeding, No IV Antibiotics, No Lab Draws and No Surgical Procedures  Palliative Prophylaxis:   Frequent Pain Assessment  Psycho-social/Spiritual:  Desire for further Chaplaincy support: Not discussed  Prognosis:  Likely days to weeks.  Given sepsis, severe PAD with multiple wounds, wound infection, focus on comfort  Discharge Planning: To Be Determined  hospice house.  Family requests hospice [facility]      Primary Diagnoses: Present on Admission: . Sepsis ([place]) . Pressure injury of skin   I have reviewed the medical record, interviewed the patient and family, and examined the patient. The following aspects are pertinent.  Past Medical History:  Diagnosis Date  . Candidiasis   . Depression   . Diabetes mellitus   . Enlarged prostate   . Fibromyalgia   . GERD (gastroesophageal reflux disease)    reflux intermittent  . Hyperlipidemia   . 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 Marital status: Married    Spouse name: Not on file  . Number of children: Not on file  . Years of education: Not on file  . Highest education level: Not on file  Occupational History  . Not on file  Tobacco Use  . Smoking status: Never Smoker  . Smokeless tobacco: Never Used  Vaping Use  . Vaping Use: Never used  Substance and Sexual Activity  . Alcohol use: No  . Drug use: No  . Sexual activity: Not on file  Other Topics Concern  . Not on file  Social History Narrative  . Not on file    Social Determinants of Health   Financial Resource Strain:   . Difficulty of Paying Living Expenses: Not on file  Food Insecurity:   . Worried About Charity fundraiser in the Last Year: Not on file  . Ran Out of Food in the Last Year: Not on file  Transportation Needs:   . Lack of Transportation (Medical): Not on file  . Lack of Transportation (Non-Medical): Not on file  Physical Activity:   . Days of Exercise per Week: Not on file  . Minutes of Exercise per Session: Not on file  Stress:   . Feeling of  Stress : Not on file  Social Connections:   . Frequency of Communication with Friends and Family: Not on file  . Frequency of Social Gatherings with Friends and Family: Not on file  . Attends Religious Services: Not on file  . Active Member of Clubs or Organizations: Not on file  . Attends Archivist Meetings: Not on file  . Marital Status: Not on file   Family History  Problem Relation Age of Onset  . Diabetes Father   . Diabetes Sister   . Diabetes Brother     Allergies  Allergen Reactions  . Bee Venom Other (See Comments)    Bump where stung and took days to go away  . Glucophage [Metformin] Diarrhea    "I will never take it again - stool incontinent"  . Shellfish Allergy Swelling  . Grass Extracts [Gramineae Pollens] Other (See Comments)    Unknown rxn  . Tobacco [Tobacco] Other (See Comments)    Unknown rxn      Vital Signs: BP (!) 132/57   Pulse 96   Temp 98.2 F (36.8 C) (Oral)   Resp 16   Wt 63.5 kg   SpO2 100%   BMI 21.29 kg/m  Pain Scale: 0-10   Pain Score: 0-No pain   SpO2: SpO2: 100 % O2 Device:SpO2: 100 % O2 Flow Rate: .     Palliative Assessment/Data: 20%     Time In: 2:00 Time Out: 3:00 Time Total: 60 minutes Visit consisted of counseling and education dealing with the complex and emotionally intense issues surrounding the need for palliative care and symptom management in the setting of serious and potentially  life-threatening illness. Greater than 50%  of this time was spent counseling and coordinating care related to the above assessment and plan.  Signed by: Florentina Jenny, PA-C Palliative Medicine  Please contact Palliative Medicine Team phone at [phone] for questions and concerns.  For individual provider: See Shea Evans

## 2020-04-29 NOTE — Progress Notes (Addendum)
Phone call to [facility] to complete a referral at patient's brother's request Elson Clan, [facility]). Message left with the on-call service requesting a return call.  5:00pm Referral completed for [facility]. H&P, Prognosis and Face Sheet faxed to [phone]   [facility], [place] Transition of Care [phone]

## 2020-04-29 NOTE — Progress Notes (Addendum)
0600 Page to Internal Medicine [phone] to notify of hemoglobin 6.8/hematocrit 22.2., awaiting return call.  0605 Dr Konrad Penta returned call with new orders.  0630 Consent in chart and type & screen drawn and taken to lab by lab technician.

## 2020-04-30 DIAGNOSIS — L89154 Pressure ulcer of sacral region, stage 4: Secondary | ICD-10-CM

## 2020-04-30 DIAGNOSIS — M86071 Acute hematogenous osteomyelitis, right ankle and foot: Secondary | ICD-10-CM

## 2020-04-30 LAB — TYPE AND SCREEN
ABO/RH(D): A POS
Antibody Screen: NEGATIVE
Unit division: 0

## 2020-04-30 LAB — BPAM RBC
Blood Product Expiration Date: 202111192359
ISSUE DATE / TIME: 202110310805
Unit Type and Rh: 6200

## 2020-04-30 LAB — URINE CULTURE: Culture: NO GROWTH

## 2020-04-30 LAB — RESPIRATORY PANEL BY RT PCR (FLU A&B, COVID)
Influenza A by PCR: NEGATIVE
Influenza B by PCR: NEGATIVE
SARS Coronavirus 2 by RT PCR: NEGATIVE

## 2020-04-30 MED ORDER — ONDANSETRON HCL 4 MG/2ML IJ SOLN: 4.0000 mg | Freq: Four times a day (QID) | INTRAMUSCULAR | 0 refills | Status: AC | PRN

## 2020-04-30 MED ORDER — SENNA 8.6 MG PO TABS: 1.0000 | ORAL_TABLET | Freq: Two times a day (BID) | ORAL | 0 refills | Status: AC

## 2020-04-30 MED ORDER — ACETAMINOPHEN 650 MG RE SUPP: 650.0000 mg | Freq: Four times a day (QID) | RECTAL | 0 refills | Status: AC | PRN

## 2020-04-30 MED ORDER — ONDANSETRON HCL 4 MG PO TABS: 4.0000 mg | ORAL_TABLET | Freq: Four times a day (QID) | ORAL | 0 refills | Status: AC | PRN

## 2020-04-30 MED ORDER — NYSTATIN 100000 UNIT/GM EX POWD: Freq: Three times a day (TID) | CUTANEOUS | 0 refills | Status: AC

## 2020-04-30 MED ORDER — POLYETHYLENE GLYCOL 3350 17 G PO PACK: 17.0000 g | PACK | Freq: Every day | ORAL | 0 refills | Status: AC | PRN

## 2020-04-30 MED ORDER — MORPHINE SULFATE (CONCENTRATE) 10 MG/0.5ML PO SOLN: 10.0000 mg | Freq: Four times a day (QID) | ORAL | Status: AC

## 2020-04-30 MED ORDER — ACETAMINOPHEN 325 MG PO TABS: 650.0000 mg | ORAL_TABLET | Freq: Four times a day (QID) | ORAL | Status: AC | PRN

## 2020-04-30 MED ORDER — CALAMINE EX LOTN: TOPICAL_LOTION | CUTANEOUS | 0 refills | Status: AC | PRN

## 2020-04-30 MED ORDER — RISPERIDONE 1 MG PO TABS: 1.0000 mg | ORAL_TABLET | Freq: Two times a day (BID) | ORAL | Status: AC

## 2020-04-30 NOTE — TOC Transition Note (Signed)
Transition of Care [facility]) - CM/SW Discharge Note   Patient Details  Name: Hayden [name] MRN: [id number] Date of Birth: Sep 14, 1940  [facility] of [facility] CM/SW Contact:  Joanne Chars, LCSW Phone Number: 04/30/2020, 2:10 PM   Clinical Narrative: Admission to residential hospice [facility] coordinated with [place].  Bed assignment upon arrival.  RN call report to [phone].      Final next level of care: [place] Barriers to Discharge: Barriers Resolved   Patient Goals and CMS Choice        Discharge Placement              Patient [facility] bed at:  ([facility]) Patient to be transferred to facility by: [place] Name of family member notified: [name], brother Patient and family notified of of transfer: 04/30/20  Discharge Plan and Services     Post Acute Care Choice: Hospice          DME Arranged: N/A         HH Arranged: NA HH Agency: NA        Social Determinants of Health (SDOH) Interventions     Readmission Risk Interventions No flowsheet data found.

## 2020-04-30 NOTE — Care Management Important Message (Signed)
Important Message  Patient Details  Name: Hayden [name] MRN: [id number] Date of Birth: 09-12-1940   Medicare Important Message Given:  Yes     Orbie Pyo 04/30/2020, 4:24 PM

## 2020-04-30 NOTE — Progress Notes (Signed)
Orthopedic Tech Progress Note Patient Details:  Hayden [name] 06/23/1941 [id number] RN said patient is going to a nursing home and is on comfort care... she said no need Patient ID: [name], male   DOB: [birth year], 79 y.o.   MRN: [id number]   [name] 04/30/2020, 6:14 PM

## 2020-04-30 NOTE — Discharge Summary (Signed)
Name: Hayden [name] MRN: [id number] DOB: [birth year] 79 y.o. PCP: Patient, No Pcp Per  Date of Admission: 04/26/2020  1:29 PM Date of Discharge:  Attending Physician: Aldine Contes, MD  Discharge Diagnosis: 1. Full Comfort measures 2. 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 3. Osteomyelitis of the right calcaneous 4. Acute anemia   Discharge Medications: Allergies as of 04/30/2020      Reactions   Bee Venom Other (See Comments)   Bump where stung and took days to go away   Glucophage [metformin] Diarrhea   "I will never take it again - stool incontinent"   Shellfish Allergy Swelling   Grass Extracts [gramineae Pollens] Other (See Comments)   Unknown rxn   Tobacco [tobacco] Other (See Comments)   Unknown rxn      Medication List    STOP taking these medications   amLODipine 10 MG tablet Commonly known as: NORVASC   BAZA PROTECT EX   [place] Crea   donepezil 10 MG tablet Commonly known as: ARICEPT   furosemide 20 MG tablet Commonly known as: LASIX   haloperidol 5 MG tablet Commonly known as: HALDOL   insulin lispro 100 UNIT/ML KwikPen Commonly known as: HUMALOG   Levemir FlexTouch 100 UNIT/ML FlexPen Generic drug: insulin detemir   loperamide 2 MG capsule Commonly known as: IMODIUM   lovastatin 20 MG tablet Commonly known as: MEVACOR   magnesium hydroxide 400 MG/5ML suspension Commonly known as: MILK OF MAGNESIA   neomycin-bacitracin-polymyxin [id number] ointment   nystatin cream Commonly known as: MYCOSTATIN Replaced by: nystatin powder   omeprazole 20 MG capsule Commonly known as: PRILOSEC   oxyCODONE 5 MG immediate release tablet Commonly known as: Oxy IR/ROXICODONE   PROSTAT PO   rivaroxaban 20 MG Tabs tablet Commonly known as: XARELTO     TAKE these medications   acetaminophen 325 MG tablet Commonly known as: TYLENOL Take 2 tablets (650 mg total) by mouth every 6  (six) hours as needed for mild pain (or Fever >/= 101). What changed: Another medication with the same name was added. Make sure you understand how and when to take each.   acetaminophen 650 MG suppository Commonly known as: TYLENOL Place 1 suppository (650 mg total) rectally every 6 (six) hours as needed for mild pain (or Fever >/= 101). What changed: You were already taking a medication with the same name, and this prescription was added. Make sure you understand how and when to take each.   calamine lotion Apply topically as needed for itching.   diphenhydrAMINE 25 MG tablet Commonly known as: BENADRYL Take 25 mg by mouth every 8 (eight) hours. 3 Day Course   guaiFENesin 100 MG/5ML liquid Commonly known as: ROBITUSSIN Take 200 mg by mouth every 6 (six) hours as needed for cough.   morphine CONCENTRATE 10 MG/0.5ML Soln concentrated solution Place 0.5 mLs (10 mg total) under the tongue every 6 (six) hours.   nystatin powder Commonly known as: MYCOSTATIN/NYSTOP Apply topically 3 (three) times daily. Replaces: nystatin cream   ondansetron 4 MG tablet Commonly known as: ZOFRAN Take 1 tablet (4 mg total) by mouth every 6 (six) hours as needed for nausea.   ondansetron 4 MG/2ML Soln injection Commonly known as: ZOFRAN Inject 2 mLs (4 mg total) into the vein every 6 (six) hours as needed for nausea.   polyethylene glycol 17 g packet Commonly known as: MIRALAX / GLYCOLAX Take 17 g by mouth daily as needed for  mild constipation.   risperiDONE 1 MG tablet Commonly known as: RISPERDAL Take 1 tablet (1 mg total) by mouth 2 (two) times daily. What changed: when to take this   senna 8.6 MG Tabs tablet Commonly known as: SENOKOT Take 1 tablet (8.6 mg total) by mouth 2 (two) times daily.   sertraline 25 MG tablet Commonly known as: ZOLOFT Take 50 mg by mouth daily.   tamsulosin 0.4 MG Caps capsule Commonly known as: FLOMAX Take 0.4 mg by mouth daily.              Discharge Care Instructions  (From admission, onward)         Start     Ordered   04/30/20 0000  Discharge wound care:       Comments: Pt has pain with dressing changes. Dressing changes per receiving facility provider's guidance.   04/30/20 1057          Disposition and follow-up:   [name] was discharged from [facility] in Stable condition.  At the hospital follow up visit please address:  1.  Full comfort measures. Pt discharged to [place] residential hospice facility.  --morphine sublingual solution 10mg  every 6 hours as needed for pain  2.  Labs / imaging needed at time of follow-up: none  3.  Pending labs/ test needing follow-up: none  Follow-up Appointments: none   Hospital Course: 79 yo gentleman who is bed-bound secondary to prior stroke who presented to [facility] from [place] facility on 04/26/20 for worsening infections of his chronic decubitus ulcers.   He has numerous decubitus ulcers involving but not limited to his bilateral heels, sacrum, right first toe, bilateral plantar surfaces, and left hip. Most of these are stage IV.   He had been steadily declining overall since breaking his hip months prior to admission.  Prior to admission, he had a PICC placed on 02/21/20 for IV antibiotics and had been receiving vancomycin and zosyn up until a week prior to admission (possibly due to issues with IV access?)  On admission, imaging suggested osteomyelitis of the right calcaneous. Pt was noted to be septic and he was started on IV antibiotics. Wound care and orthopedics was consulted. Orthopedics recommended bilateral BKA however were hesitant to do this due to his comorbidities and overall poor prognosis.   His hospitalization was further complicated by anemia requiring blood transfusion.  Over the course of his hospitalization, palliative care was consulted to assist in helping patient's family  determine goals of care. On 10/31, pt's POA ultimately determined that full comfort measures would be in [place]'s best interest. Antibiotics were discontinued on 10/31. Social work assisted in finding residential hospice facility placement.  He was discharged on 04/30/20 to [facility] [place] residential [facility].  Discharge Vitals:   BP (!) 155/73 (BP Location: Left Arm)   Pulse (!) 102   Temp 97.6 F (36.4 C) (Oral)   Resp 18   Wt 63.5 kg   SpO2 98%   BMI 21.29 kg/m   Pertinent Labs, Studies, and Procedures:   Xray right foot: Skin defect overlying the heel which appears to extend to the inferior/posterior surface of the calcaneus where there is a focal area of osseous demineralization, concerning for osteomyelitis.  Discharge Instructions: Discharge Instructions    Discharge instructions   Complete by: As directed    Discharge to [facility] residential hospice facility pending arrangement of transportation.   Discharge wound care:   Complete by: As directed  Pt has pain with dressing changes. Dressing changes per receiving facility provider's guidance.      Signed: Mitzi Hansen, MD Internal Medicine Resident PGY-2 [facility] Pager: [phone] 04/30/2020 11:38 AM

## 2020-04-30 NOTE — Progress Notes (Signed)
   Subjective:   Hospital day: 4  Overnight event: Blood pressure dropped to 67/34 so LR at 75 cc/h started.  Blood pressure improved to 155/73 this morning.  Patient is seen at bedside.  He appears comfortable and in no acute distress.  Patient is nonverbal but able to give sign that he is not in pain or any discomfort.  Discussed with him that we cannot proceed with comfort care and we will try to make him comfortable as much as possible.    Speaking with patient's brother Hayden [name] for the phone this morning.  I explained to him that patient is made comfort and would not have any surgeries or IV antibiotics.  We will try to keep him comfortable with IV pain medication.  Await bed placement for hosp[facility] at [facility] understands the plan.  Objective:  Vital signs in last 24 hours: Vitals:   04/29/20 0829 04/29/20 0847 04/29/20 1210 04/29/20 2255  BP: (!) 137/59 134/66 (!) 132/57 (!) 67/34  Pulse:    98  Resp: 16 16 16 20   Temp: 98.3 F (36.8 C) 98.1 F (36.7 C) 98.2 F (36.8 C) 99 F (37.2 C)  TempSrc: Oral Oral Oral Oral  SpO2: 96% 93% 100% 95%  Weight:        Physical Exam  Physical Exam Constitutional:      General: He is not in acute distress.    Appearance: He is not toxic-appearing.     Comments: Nonverbal  HENT:     Head: Normocephalic.  Eyes:     General:        Right eye: No discharge.        Left eye: No discharge.  Cardiovascular:     Rate and Rhythm: Normal rate and regular rhythm.  Pulmonary:     Effort: No respiratory distress.     Breath sounds: Normal breath sounds.  Musculoskeletal:     Right lower leg: No edema.     Left lower leg: No edema.     Comments: Contracted bilateral lower extremities  Neurological:     Mental Status: He is alert.  Psychiatric:        Mood and Affect: Mood normal.     Assessment/Plan: Hayden [name] is a 79 y.o. male with hx of diabetes, hypertension, hyperlipidemia, fibromyalgia, CVA, GERD presenting with  sepsis secondary to multiple pressure ulcer wounds.  Palliative care was consulted and will proceed with comfort care.  Principal Problem:   Sepsis ([place]) Active Problems:   Type 2 diabetes mellitus without complication, with long-term current use of insulin (HCC)   Pressure injury of skin   Comfort measures only status   Palliative care encounter   Acute hematogenous osteomyelitis of right foot ([place])  Comfort measures Patient presents with sepsis secondary to multiple pressure ulcers and is on chronic vancomycin.  Due to the severity and multiple comorbidities, palliative care was consulted.  After discussion with with his brother Hayden [name], they decided to proceed with comfort care. -No orthopedic surgery -No IV antibiotics -Morphine scheduled for pain and shortness of breath -Awaiting placement at hospice facility in [place]  Diet: Thin fluid diet  IVF: NA VTE: NA CODE: DNR  Prior to Admission Living Arrangement: [place] for nursing facility Anticipated Discharge Location: [place] hospice facility Barriers to Discharge: Bed placement Dispo: Anticipated discharge in approximately 1-2 day(s).   Gaylan Gerold, DO 04/30/2020, 5:39 AM Pager: [phone] After 5pm on weekdays and 1pm on weekends: On Call pager [phone]

## 2020-04-30 NOTE — Consult Note (Signed)
ORTHOPAEDIC CONSULTATION  REQUESTING PHYSICIAN: Aldine Contes, MD  Chief Complaint: Decubitus ulcers both feet and sacrum.  HPI: Hayden [name] is a 79 y.o. male who presents with sacral and left hip decubitus ulcers with decubitus ulcers on both heels.  Past Medical History:  Diagnosis Date  . Candidiasis   . Depression   . Diabetes mellitus   . Enlarged prostate   . Fibromyalgia   . GERD (gastroesophageal reflux disease)    reflux intermittent  . Hyperlipidemia   . 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 ADENOIDECTOMY    . CARPAL TUNNEL RELEASE     rt hand  . CATARACT EXTRACTION, BILATERAL    . CERVICAL FUSION  1998  . CYSTOSCOPY  07/26/2012   Procedure: CYSTOSCOPY;  Surgeon: Ailene Rud, MD;  [facility];  Service: Urology;  Laterality: N/A;  DIAGNOSTIC CYSTO PROSTATE ULTRASOUND      . FEMUR IM NAIL Right 02/10/2019   Procedure: INTRAMEDULLARY (IM) NAIL FEMORAL;  Surgeon: Hiram Gash, MD;  Location: [facility];  Service: Orthopedics;  Laterality: Right;  . HIP ARTHROPLASTY Left 01/17/2020   Procedure: ARTHROPLASTY LATERAL POSTERIOR APPROACH HIP (HEMIARTHROPLASTY);  Surgeon: Renette Butters, MD;  Location: [facility];  Service: Orthopedics;  Laterality: Left;  . PROSTATE BIOPSY  07/26/2012   Procedure: PROSTATE BIOPSY;  Surgeon: Ailene Rud, MD;  Location: [facility];  Service: Urology;  Laterality: N/A;  . retinal micro aneurysms    . tonsil    . VASECTOMY    . VITRECTOMY     Social History   Socioeconomic History  . Marital status: Married    Spouse name: Not on file  . Number of children: Not on file  . Years of education: Not on file  . Highest education level: Not on file  Occupational History  . Not on file  Tobacco Use  .  Smoking status: Never Smoker  . Smokeless tobacco: Never Used  Vaping Use  . Vaping Use: Never used  Substance and Sexual Activity  . Alcohol use: No  . Drug use: No  . Sexual activity: Not on file  Other Topics Concern  . Not on file  Social History Narrative  . Not on file   Social Determinants of Health   Financial Resource Strain:   . Difficulty of Paying Living Expenses: Not on file  Food Insecurity:   . Worried About Charity fundraiser in the Last Year: Not on file  . Ran Out of Food in the Last Year: Not on file  Transportation Needs:   . Lack of Transportation (Medical): Not on file  . Lack of Transportation (Non-Medical): Not on file  Physical Activity:   . Days of Exercise per Week: Not on file  . Minutes of Exercise per Session: Not on file  Stress:   . Feeling of Stress : Not on file  Social Connections:   . Frequency of Communication with Friends and Family: Not on file  . Frequency of Social Gatherings with Friends and Family: Not on file  . Attends Religious Services: Not on file  . Active Member of Clubs or Organizations: Not on file  . Attends Archivist Meetings: Not on file  . Marital Status: Not on file   Family History  Problem Relation Age of Onset  . Diabetes Father   . Diabetes Sister   . Diabetes Brother    - negative except otherwise stated in the family history section Allergies  Allergen Reactions  . Bee Venom Other (See Comments)    Bump where stung and took days to go away  . Glucophage [Metformin] Diarrhea    "I will never take it again - stool incontinent"  . Shellfish Allergy Swelling  . Grass Extracts [Gramineae Pollens] Other (See Comments)    Unknown rxn  . Tobacco [Tobacco] Other (See Comments)    Unknown rxn   Prior to Admission medications   Medication Sig Start Date End Date Taking? Authorizing Provider  diphenhydrAMINE (BENADRYL) 25 MG tablet Take 25 mg by mouth every 8 (eight) hours. 3 Day Course   Yes  [provider]  guaiFENesin (ROBITUSSIN) 100 MG/5ML liquid Take 200 mg by mouth every 6 (six) hours as needed for cough.   Yes [provider]  Infant Care Products (DERMACLOUD) CREA Apply 1 application topically in the morning and at bedtime. Buttocks   Yes [provider]  insulin lispro (HUMALOG) 100 UNIT/ML KwikPen Inject 6 Units into the skin 3 (three) times daily with meals. 10/11/18  Yes [provider]  LEVEMIR FLEXTOUCH 100 UNIT/ML Pen Inject 24 Units into the skin at bedtime. Patient taking differently: Inject 24 Units into the skin daily.  02/16/19  Yes Eugenie Filler, MD  loperamide (IMODIUM) 2 MG capsule Take 2 mg by mouth See admin instructions. Take 2 mg by mouth with each loose stool as needed for diarrhea (do not exceed 8 doses in 24 hours)   Yes [provider]  lovastatin (MEVACOR) 20 MG tablet Take 20 mg by mouth at bedtime.    Yes [provider]  magnesium hydroxide (MILK OF MAGNESIA) 400 MG/5ML suspension Take 30 mLs by mouth at bedtime as needed for mild constipation.   Yes [provider]  neomycin-bacitracin-polymyxin (NEOSPORIN) [phone] ointment Apply 1 application topically See admin instructions. *Standing order for skin tear, abrasions* Clean area with normal saline. Apply tao, cover with bandaid or gauze and tape. Change as needed until healed.   Yes [provider]  nystatin cream (MYCOSTATIN) Apply 1 application topically every 12 (twelve) hours. 14 Days   Yes [provider]  omeprazole (PRILOSEC) 20 MG capsule Take 1 capsule (20 mg total) by mouth daily. 05/11/19  Yes Plunkett, Loree Fee, MD  oxyCODONE (OXY IR/ROXICODONE) 5 MG immediate release tablet Take 0.5-1 pills every 6 hrs as needed for pain Patient taking differently: Take 5 mg by mouth every 12 (twelve) hours as needed for severe pain.  01/18/20  Yes McBane, Maylene Roes, PA-C  Pollen Extracts (PROSTAT PO) Take 30 mLs by mouth  in the morning, at noon, and at bedtime.   Yes [provider]  risperiDONE (RISPERDAL) 1 MG tablet Take 1 tablet (1 mg total) by mouth in the morning and at bedtime. 01/19/20  Yes Gherghe, Vella Redhead, MD  rivaroxaban (XARELTO) 20 MG TABS tablet Take 1 tablet (20 mg total) by mouth daily with supper. Patient will be on Xarelto loading dose until 06/07/2019, then he will be transitioned to this dose Xarelto 20 mg p.o. once daily on 06/08/2019 Patient taking differently: Take 20 mg by mouth daily.  06/08/19  Yes Elgergawy, Silver Huguenin, MD  sertraline (ZOLOFT) 25 MG tablet Take 50 mg by mouth daily.    Yes [provider]  Skin Protectants,  Misc. (BAZA PROTECT EX) Apply 1 application topically as needed (incontinence episode). Buttocks and scrotum   Yes [provider]  tamsulosin (FLOMAX) 0.4 MG CAPS capsule Take 0.4 mg by mouth daily.    Yes [provider]  acetaminophen (TYLENOL) 325 MG tablet Take 2 tablets (650 mg total) by mouth every 6 (six) hours as needed for mild pain (or Fever >/= 101). Patient not taking: Reported on 04/26/2020 05/19/19   Elgergawy, Silver Huguenin, MD  acetaminophen (TYLENOL) 325 MG tablet Take 2 tablets (650 mg total) by mouth every 6 (six) hours as needed for mild pain (or Fever >/= 101). 04/30/20   Mitzi Hansen, MD  acetaminophen (TYLENOL) 650 MG suppository Place 1 suppository (650 mg total) rectally every 6 (six) hours as needed for mild pain (or Fever >/= 101). 04/30/20   Mitzi Hansen, MD  amLODipine (NORVASC) 10 MG tablet Take 1 tablet (10 mg total) by mouth daily. Patient not taking: Reported on 04/26/2020 05/20/19   Elgergawy, Silver Huguenin, MD  calamine lotion Apply topically as needed for itching. 04/30/20   Mitzi Hansen, MD  donepezil (ARICEPT) 10 MG tablet Take 1 tablet (10 mg total) by mouth at bedtime. Patient not taking: Reported on 04/26/2020 05/03/18   Cameron Sprang, MD  furosemide (LASIX) 20 MG tablet Take 1 tablet (20 mg total)  by mouth daily. Patient not taking: Reported on 04/26/2020 12/03/19   Drenda Freeze, MD  haloperidol (HALDOL) 5 MG tablet Take 1 tablet (5 mg total) by mouth every 8 (eight) hours as needed for agitation. Patient not taking: Reported on 04/26/2020 01/19/20   Caren Griffins, MD  Morphine Sulfate (MORPHINE CONCENTRATE) 10 MG/0.5ML SOLN concentrated solution Place 0.5 mLs (10 mg total) under the tongue every 6 (six) hours. 04/30/20   Mitzi Hansen, MD  nystatin (MYCOSTATIN/NYSTOP) powder Apply topically 3 (three) times daily. 04/30/20   Mitzi Hansen, MD  ondansetron (ZOFRAN) 4 MG tablet Take 1 tablet (4 mg total) by mouth every 6 (six) hours as needed for nausea. 04/30/20   Mitzi Hansen, MD  ondansetron (ZOFRAN) 4 MG/2ML SOLN injection Inject 2 mLs (4 mg total) into the vein every 6 (six) hours as needed for nausea. 04/30/20   Mitzi Hansen, MD  polyethylene glycol (MIRALAX / GLYCOLAX) 17 g packet Take 17 g by mouth daily as needed for mild constipation. 04/30/20   Mitzi Hansen, MD  risperiDONE (RISPERDAL) 1 MG tablet Take 1 tablet (1 mg total) by mouth 2 (two) times daily. 04/30/20   Mitzi Hansen, MD  senna (SENOKOT) 8.6 MG TABS tablet Take 1 tablet (8.6 mg total) by mouth 2 (two) times daily. 04/30/20   Mitzi Hansen, MD  calcium citrate-vitamin D (CITRACAL+D) 315-200 MG-UNIT per tablet Take 1 tablet by mouth daily.  09/04/11  [provider]  terazosin (HYTRIN) 5 MG capsule Take 5 mg by mouth at bedtime.  09/04/11  [provider]   No results found. - pertinent xrays, CT, MRI studies were reviewed and independently interpreted  Positive ROS: All other systems have been reviewed and were otherwise negative with the exception of those mentioned in the HPI and as above.  Physical Exam: General: Patient is not alert and not oriented only responds to painful stimulation.   Psychiatric: Patient is not competent for consent Lymphatic: No axillary or cervical  lymphadenopathy Cardiovascular: No pedal edema Respiratory: No cyanosis, no use of accessory musculature GI: No organomegaly, abdomen is soft and non-tender    Images:  @ENCIMAGES @  Labs:  Lab Results  Component Value Date   HGBA1C 8.0 (H) 04/28/2020   HGBA1C 6.7 (H) 01/16/2020   HGBA1C 6.9 (H) 05/14/2019   ESRSEDRATE 75 (H) 04/26/2020   ESRSEDRATE 5 03/08/2018   ESRSEDRATE 5 09/07/2017   CRP 14.4 (H) 04/26/2020   CRP 3.9 (H) 05/19/2019   CRP 4.7 (H) 05/18/2019   REPTSTATUS 04/30/2020 FINAL 04/29/2020   CULT  04/29/2020    NO GROWTH Performed at [facility], [place] [address], [place], [place] [address]    LABORGA METHICILLIN RESISTANT STAPHYLOCOCCUS AUREUS (A) 07/02/2018   LABORGA MORGANELLA MORGANII (A) 07/02/2018    Lab Results  Component Value Date   ALBUMIN 2.1 (L) 04/28/2020   ALBUMIN 2.2 (L) 04/27/2020   ALBUMIN 2.3 (L) 04/26/2020    Neurologic: Patient does not have protective sensation bilateral lower extremities.   MUSCULOSKELETAL:   Skin: Examination patient has 2 large decubitus ulcers on both heels that are superficial and there is no exposed bone.  He has a large decubitus ulcer over the sacrum as well as over the left hip.  Patient has a good dorsalis pedis pulse bilaterally.  Patient has fixed flexion contractures of both hips and both knees.  Patient is noncommunicative.  Assessment: Decubitus ulcers both heels with multiple other pressure ulcers on both feet with a sacral decubitus ulcer and a decubitus ulcer over the left hip.  Plan: I will write orders for foam boots for both feet recommend a mattress to unload the pressure points and recommend turning every 2 hours.  Patient is not a candidate for surgical intervention.  Thank you for the consult and the opportunity to see Mr. Tiffany Talarico, [place] [phone] 4:50 PM

## 2020-04-30 NOTE — TOC Initial Note (Signed)
Transition of Care [facility]) - Initial/Assessment Note    Patient Details  Name: Hayden [name] MRN: [id number] Date of Birth: April 20, 1941  [facility] CM/SW Contact:    Joanne Chars, LCSW Phone Number: 04/30/2020, 11:00 AM  Clinical Narrative:   CSW attempted to speak with pt in his room but could not wake him.  CSW spoke with pt brother and informed him that [facility] has offered a bed and MD reports pt can transfer today.                  Expected Discharge Plan: [place] Barriers to Discharge: No Barriers Identified   Patient Goals and CMS Choice        Expected Discharge Plan and Services Expected Discharge Plan: [address] Choice: Hospice Living arrangements for the past 2 months: [place] Expected Discharge Date: 04/30/20                                    Prior Living Arrangements/Services Living arrangements for the past 2 months: [place] Lives with:: Facility Resident          Need for Family Participation in Patient Care: Yes (Comment) Care giver support system in place?: Yes (comment)   Criminal Activity/Legal Involvement Pertinent to Current Situation/Hospitalization: No - Comment as needed  Activities of Daily Living      Permission Sought/Granted                  Emotional Assessment Appearance:: Appears stated age Attitude/Demeanor/Rapport: Unable to Assess Affect (typically observed): Unable to Assess Orientation: : Oriented to Self Alcohol / Substance Use: Not Applicable Psych Involvement: No (comment)  Admission diagnosis:  Wound check, abscess [Z51.89] Sepsis ([place]) [A41.9] Acute hematogenous osteomyelitis of right foot ([place]) [M86.071] Patient Active Problem List   Diagnosis Date Noted  . Comfort measures only status   . Palliative care encounter   . Acute hematogenous osteomyelitis of right foot ([place])   . Sepsis  ([place]) 04/26/2020  . Closed right hip fracture ([place]) 01/16/2020  . Left displaced femoral neck fracture ([place]) 01/16/2020  . DNR (do not resuscitate) 01/16/2020  . Right lower lobe pneumonia 12/04/2019  . Hospice care patient 05/19/2019  . Acute pulmonary embolus ([place]) 05/14/2019  . Acute pulmonary embolism ([place]) 05/14/2019  . Pneumonia due to COVID-19 virus   . Aspiration pneumonitis ([place]) 05/13/2019  . Closed fracture of right hip ([place])   . Femur fracture, right ([place]) 02/10/2019  . Fall   . Acute encephalopathy 07/02/2018  . Dementia without behavioral disturbance ([place]) 05/24/2018  . Pressure injury of skin 05/23/2018  . Cellulitis 04/17/2018  . Cellulitis, scrotum 03/09/2018  . Cellulitis of male genitalia 03/08/2018  . Depression 03/08/2018  . BPH (benign prostatic hyperplasia) 03/08/2018  . Leukocytosis 03/08/2018  . Mild dementia ([place]) 10/23/2017  . Lower urinary tract infectious disease 09/06/2017  . Hyperglycemia 09/06/2017  . ARF (acute renal failure) ([place]) 09/06/2017  . Altered mental status 09/06/2017  . Type 2 diabetes mellitus without complication, with long-term current use of insulin ([place]) 04/02/2017  . 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 Bilateral hand pain 12/09/2016  . 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

## 2020-05-01 LAB — CULTURE, BLOOD (ROUTINE X 2)
Culture: NO GROWTH
Culture: NO GROWTH
Special Requests: ADEQUATE

## 2020-05-30 DEATH — deceased
